# Patient Record
Sex: Male | Born: 1980 | Race: Black or African American | Hispanic: No | Marital: Single | Smoking: Current every day smoker
Health system: Southern US, Community
[De-identification: ages and names within clinical notes are randomized; demographics above are authoritative.]

## PROBLEM LIST (undated history)

## (undated) DIAGNOSIS — D573 Sickle-cell trait: Secondary | ICD-10-CM

## (undated) DIAGNOSIS — I2699 Other pulmonary embolism without acute cor pulmonale: Secondary | ICD-10-CM

## (undated) DIAGNOSIS — S3992XA Unspecified injury of lower back, initial encounter: Secondary | ICD-10-CM

## (undated) DIAGNOSIS — G4700 Insomnia, unspecified: Secondary | ICD-10-CM

## (undated) DIAGNOSIS — F419 Anxiety disorder, unspecified: Secondary | ICD-10-CM

## (undated) DIAGNOSIS — J189 Pneumonia, unspecified organism: Secondary | ICD-10-CM

## (undated) DIAGNOSIS — M519 Unspecified thoracic, thoracolumbar and lumbosacral intervertebral disc disorder: Secondary | ICD-10-CM

## (undated) DIAGNOSIS — M545 Low back pain: Secondary | ICD-10-CM

## (undated) DIAGNOSIS — Z8489 Family history of other specified conditions: Secondary | ICD-10-CM

## (undated) DIAGNOSIS — G43009 Migraine without aura, not intractable, without status migrainosus: Secondary | ICD-10-CM

## (undated) DIAGNOSIS — Z87442 Personal history of urinary calculi: Secondary | ICD-10-CM

## (undated) DIAGNOSIS — G8929 Other chronic pain: Secondary | ICD-10-CM

## (undated) DIAGNOSIS — M5090 Cervical disc disorder, unspecified, unspecified cervical region: Secondary | ICD-10-CM

## (undated) DIAGNOSIS — K219 Gastro-esophageal reflux disease without esophagitis: Secondary | ICD-10-CM

## (undated) DIAGNOSIS — M549 Dorsalgia, unspecified: Secondary | ICD-10-CM

## (undated) HISTORY — DX: Cervical disc disorder, unspecified, unspecified cervical region: M50.90

## (undated) HISTORY — DX: Anxiety disorder, unspecified: F41.9

## (undated) HISTORY — DX: Unspecified thoracic, thoracolumbar and lumbosacral intervertebral disc disorder: M51.9

## (undated) HISTORY — DX: Low back pain: M54.5

## (undated) HISTORY — DX: Gastro-esophageal reflux disease without esophagitis: K21.9

## (undated) HISTORY — DX: Other chronic pain: G89.29

## (undated) HISTORY — DX: Insomnia, unspecified: G47.00

## (undated) HISTORY — PX: NO PAST SURGERIES: SHX2092

## (undated) HISTORY — DX: Migraine without aura, not intractable, without status migrainosus: G43.009

## (undated) HISTORY — PX: BACK SURGERY: SHX140

---
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
## 2000-03-26 ENCOUNTER — Emergency Department (HOSPITAL_COMMUNITY): Admission: EM | Admit: 2000-03-26 | Discharge: 2000-03-26 | Payer: Self-pay | Admitting: Dermatology

## 2000-03-27 ENCOUNTER — Encounter: Payer: Self-pay | Admitting: Emergency Medicine

## 2000-05-23 ENCOUNTER — Emergency Department (HOSPITAL_COMMUNITY): Admission: EM | Admit: 2000-05-23 | Discharge: 2000-05-23 | Payer: Self-pay | Admitting: Internal Medicine

## 2000-05-23 ENCOUNTER — Encounter: Payer: Self-pay | Admitting: Internal Medicine

## 2001-01-16 ENCOUNTER — Emergency Department (HOSPITAL_COMMUNITY): Admission: EM | Admit: 2001-01-16 | Discharge: 2001-01-16 | Payer: Self-pay | Admitting: Emergency Medicine

## 2002-01-17 ENCOUNTER — Emergency Department (HOSPITAL_COMMUNITY): Admission: EM | Admit: 2002-01-17 | Discharge: 2002-01-17 | Payer: Self-pay | Admitting: Emergency Medicine

## 2002-01-18 ENCOUNTER — Emergency Department (HOSPITAL_COMMUNITY): Admission: EM | Admit: 2002-01-18 | Discharge: 2002-01-18 | Payer: Self-pay | Admitting: *Deleted

## 2002-01-20 ENCOUNTER — Emergency Department (HOSPITAL_COMMUNITY): Admission: EM | Admit: 2002-01-20 | Discharge: 2002-01-20 | Payer: Self-pay | Admitting: Emergency Medicine

## 2002-09-03 ENCOUNTER — Emergency Department (HOSPITAL_COMMUNITY): Admission: EM | Admit: 2002-09-03 | Discharge: 2002-09-04 | Payer: Self-pay | Admitting: Emergency Medicine

## 2002-09-03 ENCOUNTER — Encounter: Payer: Self-pay | Admitting: *Deleted

## 2004-01-19 ENCOUNTER — Emergency Department (HOSPITAL_COMMUNITY): Admission: EM | Admit: 2004-01-19 | Discharge: 2004-01-19 | Payer: Self-pay

## 2004-05-10 ENCOUNTER — Emergency Department (HOSPITAL_COMMUNITY): Admission: EM | Admit: 2004-05-10 | Discharge: 2004-05-10 | Payer: Self-pay | Admitting: Emergency Medicine

## 2004-08-24 ENCOUNTER — Ambulatory Visit: Payer: Self-pay | Admitting: Internal Medicine

## 2005-08-25 ENCOUNTER — Emergency Department (HOSPITAL_COMMUNITY): Admission: EM | Admit: 2005-08-25 | Discharge: 2005-08-26 | Payer: Self-pay | Admitting: *Deleted

## 2005-09-03 ENCOUNTER — Emergency Department (HOSPITAL_COMMUNITY): Admission: EM | Admit: 2005-09-03 | Discharge: 2005-09-03 | Payer: Self-pay | Admitting: Family Medicine

## 2005-11-27 ENCOUNTER — Emergency Department (HOSPITAL_COMMUNITY): Admission: EM | Admit: 2005-11-27 | Discharge: 2005-11-28 | Payer: Self-pay | Admitting: Emergency Medicine

## 2006-06-14 ENCOUNTER — Emergency Department (HOSPITAL_COMMUNITY): Admission: EM | Admit: 2006-06-14 | Discharge: 2006-06-14 | Payer: Self-pay | Admitting: Emergency Medicine

## 2007-04-10 ENCOUNTER — Emergency Department (HOSPITAL_COMMUNITY): Admission: EM | Admit: 2007-04-10 | Discharge: 2007-04-10 | Payer: Self-pay | Admitting: Family Medicine

## 2007-09-10 ENCOUNTER — Telehealth (INDEPENDENT_AMBULATORY_CARE_PROVIDER_SITE_OTHER): Payer: Self-pay | Admitting: *Deleted

## 2007-12-06 ENCOUNTER — Encounter: Payer: Self-pay | Admitting: Internal Medicine

## 2007-12-06 ENCOUNTER — Emergency Department (HOSPITAL_COMMUNITY): Admission: EM | Admit: 2007-12-06 | Discharge: 2007-12-06 | Payer: Self-pay | Admitting: Emergency Medicine

## 2008-12-05 ENCOUNTER — Emergency Department (HOSPITAL_COMMUNITY): Admission: EM | Admit: 2008-12-05 | Discharge: 2008-12-05 | Payer: Self-pay | Admitting: Emergency Medicine

## 2010-09-13 ENCOUNTER — Emergency Department (HOSPITAL_COMMUNITY): Admission: EM | Admit: 2010-09-13 | Discharge: 2010-09-13 | Payer: Self-pay | Admitting: Family Medicine

## 2010-09-20 ENCOUNTER — Emergency Department (HOSPITAL_COMMUNITY)
Admission: EM | Admit: 2010-09-20 | Discharge: 2010-09-20 | Payer: Self-pay | Source: Home / Self Care | Admitting: Emergency Medicine

## 2010-09-23 ENCOUNTER — Encounter: Payer: Self-pay | Admitting: Internal Medicine

## 2010-09-23 ENCOUNTER — Ambulatory Visit: Payer: Self-pay | Admitting: Internal Medicine

## 2010-09-23 DIAGNOSIS — G43009 Migraine without aura, not intractable, without status migrainosus: Secondary | ICD-10-CM

## 2010-09-23 DIAGNOSIS — R109 Unspecified abdominal pain: Secondary | ICD-10-CM

## 2010-09-23 HISTORY — DX: Migraine without aura, not intractable, without status migrainosus: G43.009

## 2010-09-23 LAB — CONVERTED CEMR LAB
ALT: 16 units/L (ref 0–53)
AST: 14 units/L (ref 0–37)
Albumin: 3.6 g/dL (ref 3.5–5.2)
Alkaline Phosphatase: 40 units/L (ref 39–117)
BUN: 11 mg/dL (ref 6–23)
Basophils Absolute: 0 10*3/uL (ref 0.0–0.1)
Basophils Relative: 0.6 % (ref 0.0–3.0)
Bilirubin Urine: NEGATIVE
Bilirubin, Direct: 0 mg/dL (ref 0.0–0.3)
CO2: 29 meq/L (ref 19–32)
Calcium: 9.1 mg/dL (ref 8.4–10.5)
Chloride: 107 meq/L (ref 96–112)
Creatinine, Ser: 0.9 mg/dL (ref 0.4–1.5)
Eosinophils Absolute: 0.3 10*3/uL (ref 0.0–0.7)
Eosinophils Relative: 3.2 % (ref 0.0–5.0)
GFR calc non Af Amer: 122.04 mL/min (ref >60.00)
Glucose, Bld: 86 mg/dL (ref 70–99)
HCT: 40.3 % (ref 39.0–52.0)
Hemoglobin: 13.6 g/dL (ref 13.0–17.0)
Ketones, ur: NEGATIVE mg/dL
Leukocytes, UA: NEGATIVE
Lipase: 22 units/L (ref 11.0–59.0)
Lymphocytes Relative: 16.3 % (ref 12.0–46.0)
Lymphs Abs: 1.4 10*3/uL (ref 0.7–4.0)
MCHC: 33.7 g/dL (ref 30.0–36.0)
MCV: 85.5 fL (ref 78.0–100.0)
Monocytes Absolute: 0.7 10*3/uL (ref 0.1–1.0)
Monocytes Relative: 8.8 % (ref 3.0–12.0)
Neutro Abs: 5.9 10*3/uL (ref 1.4–7.7)
Neutrophils Relative %: 71.1 % (ref 43.0–77.0)
Nitrite: NEGATIVE
Platelets: 255 10*3/uL (ref 150.0–400.0)
Potassium: 4.4 meq/L (ref 3.5–5.1)
RBC: 4.71 M/uL (ref 4.22–5.81)
RDW: 13.7 % (ref 11.5–14.6)
Sodium: 142 meq/L (ref 135–145)
Specific Gravity, Urine: 1.01 (ref 1.000–1.030)
Total Bilirubin: 0.4 mg/dL (ref 0.3–1.2)
Total Protein, Urine: NEGATIVE mg/dL
Total Protein: 6.4 g/dL (ref 6.0–8.3)
Urine Glucose: NEGATIVE mg/dL
Urobilinogen, UA: 0.2 (ref 0.0–1.0)
WBC: 8.4 10*3/uL (ref 4.5–10.5)
pH: 6 (ref 5.0–8.0)

## 2010-09-24 ENCOUNTER — Encounter (INDEPENDENT_AMBULATORY_CARE_PROVIDER_SITE_OTHER): Payer: Self-pay | Admitting: *Deleted

## 2010-09-29 ENCOUNTER — Ambulatory Visit: Payer: Self-pay | Admitting: Cardiology

## 2010-10-05 ENCOUNTER — Ambulatory Visit: Payer: Self-pay | Admitting: Internal Medicine

## 2010-10-05 DIAGNOSIS — K5900 Constipation, unspecified: Secondary | ICD-10-CM | POA: Insufficient documentation

## 2010-10-06 ENCOUNTER — Telehealth: Payer: Self-pay | Admitting: Gastroenterology

## 2010-10-08 ENCOUNTER — Ambulatory Visit: Payer: Self-pay | Admitting: Gastroenterology

## 2010-10-08 ENCOUNTER — Ambulatory Visit: Payer: Self-pay | Admitting: Internal Medicine

## 2010-10-19 ENCOUNTER — Telehealth: Payer: Self-pay | Admitting: Internal Medicine

## 2010-10-21 ENCOUNTER — Telehealth: Payer: Self-pay | Admitting: Internal Medicine

## 2010-10-21 ENCOUNTER — Emergency Department (HOSPITAL_COMMUNITY)
Admission: EM | Admit: 2010-10-21 | Discharge: 2010-10-21 | Payer: Self-pay | Source: Home / Self Care | Admitting: Emergency Medicine

## 2010-10-21 LAB — DIFFERENTIAL
Basophils Absolute: 0 10*3/uL (ref 0.0–0.1)
Basophils Relative: 0 % (ref 0–1)
Eosinophils Absolute: 0.3 10*3/uL (ref 0.0–0.7)
Eosinophils Relative: 5 % (ref 0–5)
Lymphocytes Relative: 30 % (ref 12–46)
Lymphs Abs: 1.8 10*3/uL (ref 0.7–4.0)
Monocytes Absolute: 0.6 10*3/uL (ref 0.1–1.0)
Monocytes Relative: 9 % (ref 3–12)
Neutro Abs: 3.3 10*3/uL (ref 1.7–7.7)
Neutrophils Relative %: 55 % (ref 43–77)

## 2010-10-21 LAB — CBC
HCT: 41.9 % (ref 39.0–52.0)
Hemoglobin: 14.5 g/dL (ref 13.0–17.0)
MCH: 28 pg (ref 26.0–34.0)
MCHC: 34.6 g/dL (ref 30.0–36.0)
MCV: 81 fL (ref 78.0–100.0)
Platelets: 251 10*3/uL (ref 150–400)
RBC: 5.17 MIL/uL (ref 4.22–5.81)
RDW: 13.7 % (ref 11.5–15.5)
WBC: 6 10*3/uL (ref 4.0–10.5)

## 2010-10-21 LAB — URINALYSIS, ROUTINE W REFLEX MICROSCOPIC
Bilirubin Urine: NEGATIVE
Ketones, ur: NEGATIVE mg/dL
Leukocytes, UA: NEGATIVE
Nitrite: NEGATIVE
Protein, ur: NEGATIVE mg/dL
Specific Gravity, Urine: 1.026 (ref 1.005–1.030)
Urine Glucose, Fasting: NEGATIVE mg/dL
Urobilinogen, UA: 0.2 mg/dL (ref 0.0–1.0)
pH: 6 (ref 5.0–8.0)

## 2010-10-21 LAB — COMPREHENSIVE METABOLIC PANEL
ALT: 36 U/L (ref 0–53)
AST: 23 U/L (ref 0–37)
Albumin: 4 g/dL (ref 3.5–5.2)
Alkaline Phosphatase: 42 U/L (ref 39–117)
BUN: 9 mg/dL (ref 6–23)
CO2: 26 mEq/L (ref 19–32)
Calcium: 9.3 mg/dL (ref 8.4–10.5)
Chloride: 107 mEq/L (ref 96–112)
Creatinine, Ser: 1.01 mg/dL (ref 0.4–1.5)
GFR calc Af Amer: >60 mL/min (ref >60)
GFR calc non Af Amer: >60 mL/min (ref >60)
Glucose, Bld: 96 mg/dL (ref 70–99)
Potassium: 3.9 mEq/L (ref 3.5–5.1)
Sodium: 140 mEq/L (ref 135–145)
Total Bilirubin: 0.9 mg/dL (ref 0.3–1.2)
Total Protein: 7.4 g/dL (ref 6.0–8.3)

## 2010-10-21 LAB — URINE MICROSCOPIC-ADD ON

## 2010-10-22 ENCOUNTER — Ambulatory Visit: Admit: 2010-10-22 | Payer: Self-pay | Admitting: Internal Medicine

## 2010-10-28 ENCOUNTER — Telehealth: Payer: Self-pay | Admitting: Internal Medicine

## 2010-11-08 ENCOUNTER — Ambulatory Visit: Admit: 2010-11-08 | Payer: Self-pay | Admitting: Internal Medicine

## 2010-11-18 NOTE — Progress Notes (Signed)
Summary: Pain Clinic Referral  Phone Note From Other Clinic   Summary of Call: Dr Jonny Ruiz, Dr Thyra Breed ([facility] ) office called pt was scheduled for appt 10/28/10 and no showed appt. They said he was aware of appt and  they will not reschedule pt appt. Initial call taken by: Dagoberto Reef,  October 28, 2010 9:22 AM  Follow-up for Phone Call        noted Follow-up by: Corwin Levins MD,  October 28, 2010 10:19 AM

## 2010-11-18 NOTE — Letter (Signed)
Summary: Generic Letter  [facility]  [address] [place], [place] [address]   Phone: [phone]  Fax: [phone]    09/24/2010  Jason [name] [address] [place], [place]  [address]  Dear [name],   Our office has tried to contact you regarding the results of your CT scan:   shows No stone , but did show some ? inflammation near the end of the colon and some fluid in the pelvis;  there seems to be some inflammation in the abd area or infection that will need to be further eval and treated      1)   for levofloxocin course AND flagyl course     2)   blood tests still pending, but assuming the renal function is ok, will need to do:                 CT abd/pelvis WITH contrast to look for unusual problem such as abscess or other mesenteric inflammation. Your lab results were normal.  The antibiotics Dr Jonny Ruiz would like you to start are included with this letter, please start as soon as possible. If you have any further questions please contact our office at the above number. Thank you.       Sincerely,   Margaret Pyle, CMA Dr Oliver Barre  Appended Document: Generic Letter pt called back and was advised

## 2010-11-18 NOTE — Assessment & Plan Note (Signed)
Summary: new / last ov 2005 / bcbs / # / cd   Vital Signs:  Patient profile:   30 year old male Height:      68 inches Weight:      157.50 pounds BMI:     24.03 O2 Sat:      98 % on Room air Temp:     98.7 degrees F oral Pulse rate:   68 / minute BP sitting:   110 / 72  (left arm) Cuff size:   regular  Vitals Entered By: Zella Ball Ewing CMA Duncan Dull) (September 23, 2010 2:05 PM)  O2 Flow:  Room air  Preventive Care Screening  Last Tetanus Booster:    Date:  10/17/2005    Results:  Tdap   CC: New Patient, Left side pain/RE   CC:  New Patient and Left side pain/RE.  History of Present Illness: here to establish with f/u from urgent care - seen there Mon dec 5,  eval for left side/flank pain;  actually off and on for yrs, now recently for 2-3 wks ongoing pain; sharp,  no radiation;  nofever, intermittent nausea (no vomiting);  + constipation , (but also diarrhea episode 2 days ago); no blood in BM 's;  pain not better with BM;  no overt GU symtpoms - no dysuria, freq, urgency, hematuria; no prior hx of renal stones (grandmother with hx of renal stones, father estranged so unknown); movement makes no difference in pain,  nothing makes beteer  - tylenol or alleve,  though percocet did seem to help but had to take more than prescribed. Pt denies CP, worsening sob, doe, wheezing, orthopnea, pnd, worsening LE edema, palps, dizziness or syncope  Pt denies new neuro symptoms such as headache, facial or extremity weakness  Pt denies polydipsia, polyuria.   Overall good compliance with meds, trying to follow low chol diet, wt stable, little excercise however . No fever, wt loss, night sweats, loss of appetite or other constitutional symptoms  Denies worsening depressive symptoms, suicidal ideation, or panic.   Had UA with small blood from the urgent care, neg cxr as well.   Preventive Screening-Counseling & Management  Alcohol-Tobacco     Smoking Status: current      Drug Use:  no.    Problems  Prior to Update: 1)  Flank Pain, Left  (ICD-789.09) 2)  Migraine, Common  (ICD-346.10)  Medications Prior to Update: 1)  Promethazine Hcl 25 Mg  Tabs (Promethazine Hcl) .Marland Kitchen.. 1 By Mouth Qid Prn  Current Medications (verified): 1)  Promethazine Hcl 25 Mg  Tabs (Promethazine Hcl) .Marland Kitchen.. 1 By Mouth Qid Prn 2)  Ibuprofen 600 Mg Tabs (Ibuprofen) .Marland Kitchen.. 1 To 2 By Mouth Every 6 Hours As Needed 3)  Oxycodone Hcl 10 Mg Tabs (Oxycodone Hcl) .Marland Kitchen.. 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 mother with heart disease and HTN grandmother with breast cancer other with ETOH,  breast cancer, prostate cancer,  chol, renal disease  Social History: Reviewed history and no changes required. 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  , non toxic but uncomfortable Head:  normocephalic and atraumatic.   Eyes:  vision grossly intact, pupils equal, and pupils round.   Ears:  R ear normal and L ear normal.   Nose:  no external deformity and no nasal discharge.   Mouth:  no gingival abnormalities and pharynx pink and moist.   Neck:  supple and no masses.   Lungs:  normal respiratory effort and normal breath sounds.     Heart:  normal rate and regular rhythm.   Abdomen:  soft, non-tender, and normal bowel sounds.   Msk:  no joint tenderness and no joint swelling.  , no spine tenderness or paravertebral swelling Extremities:  no edema, no erythema  Neurologic:  strength normal in all extremities and gait normal.   Skin:  color normal and no rashes.   Psych:  not depressed appearing and slightly anxious.     Impression & Recommendations:  Problem # 1:  FLANK PAIN, LEFT (ICD-789.09)  His updated medication list for this problem includes:    Ibuprofen 600 Mg Tabs (Ibuprofen) .Marland Kitchen... 1 to 2 by mouth every 6 hours as needed    Oxycodone Hcl 10 Mg Tabs (Oxycodone hcl) .Marland Kitchen... 1 by mouth q 6 hrs as needed pain mod to high suspicion for renal stone given the severity of the pain, and recent blood on UA -  for Ct urogram, without  CM to start;  currently afeb, but will check wbc, holding antibx for now   Orders: TLB-BMP (Basic Metabolic Panel-BMET) (80048-METABOL) TLB-CBC Platelet - w/Differential (85025-CBCD) TLB-Udip w/ Micro (81001-URINE) T-Culture, Urine (81191-47829) TLB-Hepatic/Liver Function Pnl (80076-HEPATIC) TLB-Lipase (83690-LIPASE) Radiology Referral (Radiology)  Complete Medication List: 1)  Promethazine Hcl 25 Mg Tabs (Promethazine hcl) .Marland Kitchen.. 1 by mouth qid prn 2)  Ibuprofen 600 Mg Tabs (Ibuprofen) .Marland Kitchen.. 1 to 2 by mouth every 6 hours as needed 3)  Oxycodone Hcl 10 Mg Tabs (Oxycodone hcl) .Marland Kitchen.. 1 by mouth q 6 hrs as needed pain  Patient Instructions: 1)  Please take all new medications as prescribed 2)  Continue all previous medications as before this visit  3)  You will be contacted about the referral(s) to:  CT scan - to see PCC's prior to leaving today 4)  Please go to the Lab in the basement for your blood and/or urine tests today  5)  Please call the number on the [facility] Card for results of your testing  6)  If you have a kidney stone , you will be referred to urology 7)  Please schedule a  follow-up appointment as needed. Prescriptions: OXYCODONE HCL 10 MG TABS (OXYCODONE HCL) 1 by mouth q 6 hrs as needed pain  #40 x 0   Entered and Authorized by:   Corwin Levins MD   Signed by:   Corwin Levins MD on 09/23/2010   Method used:   Print then Give to Patient   RxID:   [id number]    Orders Added: 1)  TLB-BMP (Basic Metabolic Panel-BMET) [80048-METABOL] 2)  TLB-CBC Platelet - w/Differential [85025-CBCD] 3)  TLB-Udip w/ Micro [81001-URINE] 4)  T-Culture, Urine [[id number]] 5)  TLB-Hepatic/Liver Function  Pnl [80076-HEPATIC] 6)  TLB-Lipase [83690-LIPASE] 7)  Radiology Referral [Radiology] 8)  New Patient Level IV [[address]]

## 2010-11-18 NOTE — Assessment & Plan Note (Signed)
Summary: pain on left side-lb   Vital Signs:  Patient profile:   30 year old male Height:      68 inches Weight:      153 pounds BMI:     23.35 O2 Sat:      97 % on Room air Temp:     98.4 degrees F oral Pulse rate:   83 / minute BP sitting:   104 / 78  (left arm) Cuff size:   regular  Vitals Entered By: Zella Ball Ewing CMA Duncan Dull) (October 08, 2010 3:20 PM)  O2 Flow:  Room air CC: left side pain/RE   Primary Care Provider:  Bayard Males  CC:  left side pain/RE.  History of Present Illness: here to f/u;  GI brief eval without significant GI relation to his pain;  pt admits to med overuse and last rx with only approx 10 pills left;  Pt denies CP, worsening sob, doe, wheezing, orthopnea, pnd, worsening LE edema, palps, dizziness or syncope  Pt denies new neuro symptoms such as headache, facial or extremity weakness  Pt denies polydipsia, polyuria  No new GI complaints such as dysphagia, bowel change or blood.Marland Kitchen  No change in left side/flank pain however, sharp , persitent and simply not improved at all, mod to severe, no change with deep inspiration, BM, movement.  No rash and no spine pain.  No bladder change , or LE pain/weak/numb , gait change or falls.  No recent injury. No fever, wt loss, night sweats, loss of appetite or other constitutional symptoms   Problems Prior to Update: 1)  Constipation  (ICD-564.00) 2)  Abdominal Pain Other Specified Site  (ICD-789.09) 3)  Flank Pain, Left  (ICD-789.09) 4)  Migraine, Common  (ICD-346.10)  Medications Prior to Update: 1)  Oxycodone Hcl 5 Mg Tabs (Oxycodone Hcl) .Marland Kitchen.. 1po Q 6 Hrs As Needed Pain 2)  Metronidazole 250 Mg Tabs (Metronidazole) .Marland Kitchen.. 1 By Mouth Four Times Per Day 3)  Dexilant 60 Mg Cpdr (Dexlansoprazole) .Marland Kitchen.. 1 By Mouth 20-30 Min Before Breakfast  Current Medications (verified): 1)  Oxycodone Hcl 5 Mg Tabs (Oxycodone Hcl) .Marland Kitchen.. 1po Q 6 Hrs As Needed Pain 2)  Metronidazole 250 Mg Tabs (Metronidazole) .Marland Kitchen.. 1 By Mouth Four Times  Per Day 3)  Dexilant 60 Mg Cpdr (Dexlansoprazole) .Marland Kitchen.. 1 By Mouth 20-30 Min Before Breakfast 4)  Gabapentin 300 Mg Caps (Gabapentin) .Marland Kitchen.. 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  , non toxic but very uncomfortable Head:  normocephalic and atraumatic.   Eyes:  vision grossly intact, pupils equal, and pupils round.   Ears:  R ear normal and L ear normal.   Nose:  no external deformity and no nasal discharge.   Mouth:  no gingival abnormalities and pharynx pink and moist.   Neck:  supple and no masses.   Lungs:  normal respiratory effort and normal breath sounds.   Heart:  normal rate and regular rhythm.   Abdomen:  soft,  non-tender, and normal bowel sounds.   Msk:  no joint tenderness and no joint swelling.  , no spine tenderness or paravertebral swelling or tenderness; no flank tenderness;  no side or chest wall tender as well Extremities:  no edema, no erythema  Neurologic:  strength normal in all extremities and gait normal.   Skin:  color normal and no rashes.     Impression & Recommendations:  Problem # 1:  FLANK PAIN, LEFT (ICD-789.09)  His updated medication list for this problem includes:    Oxycodone Hcl 5 Mg Tabs (Oxycodone hcl) .Marland Kitchen... 1po q 6 hrs as needed pain pain persists , CT neg, GI eval as per emr, prior CXR per  urgent care neg per pt,  diff includes ? neuritic vs MSK , GI with neg eval;  will cont med as is for now except for trial gabapentin;  if helps  would consider referral to pain clinic for nerve block; urged pt to not overuse narcotic  Complete Medication List: 1)  Oxycodone Hcl 5 Mg Tabs (Oxycodone hcl) .Marland Kitchen.. 1po q 6 hrs as needed pain 2)  Metronidazole 250 Mg Tabs (Metronidazole) .Marland Kitchen.. 1 by mouth four times per day 3)  Dexilant 60 Mg Cpdr (Dexlansoprazole) .Marland Kitchen.. 1 by mouth 20-30 min before breakfast 4)  Gabapentin 300 Mg Caps (Gabapentin) .Marland Kitchen.. 1 by mouth three times a day  Patient Instructions: 1)  Continue all previous medications as before this visit  - you should have enough oxycodone for total 15 days with the dec 20 prescription 2)  start the gabapentin at 300 mg at night for two nights, then twice per day for 2 days, then three times per day after that 3)  please call in 10 to 14 days if the new medication seems to help, as we might then consider referral to pain clinic 4)  Please schedule a follow-up appointment as needed. Prescriptions: GABAPENTIN 300 MG CAPS (GABAPENTIN) 1 by mouth three times a day  #90 x 2   Entered and Authorized by:   Corwin Levins MD   Signed by:   Corwin Levins MD on 10/08/2010   Method used:   Print then Give to Patient   RxID:   [id number]    Orders Added: 1)  Est. Patient Level III [14782]

## 2010-11-18 NOTE — Progress Notes (Signed)
Summary: Pain Clinic Referral  Phone Note From Other Clinic   Summary of Call: Dr Jonny Ruiz, [facility] and do not have any additiional pain/rehab options to offer paitient. They do not treat abdominal or pelvic pain.  Initial call taken by: Dagoberto Reef,  October 21, 2010 9:21 AM  Follow-up for Phone Call        any other pain clinic will see? Follow-up by: Corwin Levins MD,  October 21, 2010 9:31 AM  Additional Follow-up for Phone Call Additional follow up Details #1::        I have noted pt with appt tomorrow on my schedule;  If symptoms have not changed, I have nothing further to offer this pt in terms of further tests or evaluation, or treatment - pt should re-consider appt  I do not feel comfortable with further narcotic medication for this pain as there is no  cancer or other inflammatory condition;  I would recommend alleve  = 1-2 by mouth two times a day as needed  Additional Follow-up by: Corwin Levins MD,  October 21, 2010 4:35 PM    Additional Follow-up for Phone Call Additional follow up Details #2::    left message on machine for pt to return my call. Margaret Pyle, CMA  October 22, 2010 2:00 PM   Pt advised of MD  advisement but was unsatisfied and demanded appt to discuss with JWJ. Transferred to schedule appt. Margaret Pyle, CMA  October 27, 2010 3:20 PM

## 2010-11-18 NOTE — Progress Notes (Signed)
Summary: Flank pain  Phone Note Call from Patient Call back at Home Phone [phone]   Caller: Patient Summary of Call: Pt called stating he is still having flank pain and meds are not working, pt says he was told to call back per MD at last OV Initial call taken by: Margaret Pyle, CMA,  October 19, 2010 1:21 PM  Follow-up for Phone Call        i can try muscle relaxer, but other than that I have nothing further to offer and will refer to pain clinic  done hardcopy to LIM side B - dahlia  Follow-up by: Corwin Levins MD,  October 19, 2010 1:32 PM  Additional Follow-up for Phone Call Additional follow up Details #1::        Pt advised, Rx in cabinet for pt pick up Additional Follow-up by: Margaret Pyle, CMA,  October 19, 2010 1:44 PM    New/Updated Medications: TIZANIDINE HCL 4 MG TABS (TIZANIDINE HCL) by mouth q 6 hrs as needed spasm Prescriptions: TIZANIDINE HCL 4 MG TABS (TIZANIDINE HCL) by mouth q 6 hrs as needed spasm  #50 x 1   Entered and Authorized by:   Corwin Levins MD   Signed by:   Corwin Levins MD on 10/19/2010   Method used:   Print then Give to Patient   RxID:   [id number]

## 2010-11-18 NOTE — Progress Notes (Signed)
Summary: Doc of the day  Phone Note From Other Clinic   Caller: Debra @ Dr. Jonny Ruiz  [phone] Call For: Dr. Christella Hartigan Summary of Call: abd pain requesting pt. be seen ASAP Initial call taken by: Karna Christmas,  October 06, 2010 3:11 PM  Follow-up for Phone Call        Given appt. with Dr.Jacobs for this Friday.Stanton Kidney will contact pt. Follow-up by: Teryl Lucy RN,  October 06, 2010 3:38 PM

## 2010-11-18 NOTE — Assessment & Plan Note (Signed)
History of Present Illness Visit Type: Initial Consult Primary GI MD: Rob Bunting MD Primary Provider: Bayard Males Requesting Provider: Bayard Males Chief Complaint: Abdominal Pain History of Present Illness:     Jason 30 year old [name] who is here with his mother today.  who has had severe pains in his left side, constant pain.  Getting worse.  CAn radiate to his back.  +nausea, + vomiting at times.  Moving, bending down, walking all make it worse.  Eating does not change the pains.  Bms don't make it better or worse.  No fevers or chills.  He wsas put on metronidazole a few days ago, no change in his symptoms.  Has been on oxycodone, these help.  Takes 4-5 of pills at a time.  He has not tried any antiacid meds.    He has been constapated for about a month.  Miralax once a day, didn't help.  he had a noncontrast CT scan about 2 weeks ago. I looked at those images myself. The radiologist felt there "may be a small amount of fluid around his colon". Followup scan about a week later with IV contrast and oral contrast was actually completely normal. He had CBC, complete metabolic profile, amylase, these were all normal 2 weeks ago.           Current Medications (verified): 1)  Oxycodone Hcl 5 Mg Tabs (Oxycodone Hcl) .Marland Kitchen.. 1po Q 6 Hrs As Needed Pain 2)  Metronidazole 250 Mg Tabs (Metronidazole) .Marland Kitchen.. 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  All other review of systems was otherwise negative.   Vital Signs:  Patient profile:   30 year old male Height:      68 inches Weight:      152 pounds BMI:     23.20 BSA:     1.82 Pulse rate:   80 / minute Pulse rhythm:   regular BP sitting:   92 / 70  (left arm)  Vitals Entered By: Merri Ray CMA Duncan Dull) (October 08, 2010 2:37 PM)  Physical Exam  Additional Exam:  Constitutional: generally well appearing Psychiatric: alert and oriented times 3 Eyes: extraocular movements intact Mouth: oropharynx moist, no lesions Neck: supple, no lymphadenopathy Cardiovascular: heart regular rate and rythm Lungs: CTA bilaterally Abdomen: soft, non-tender, non-distended, no obvious ascites, no peritoneal signs, normal bowel sounds; She is mildly tender in his left flank Extremities: no lower extremity edema bilaterally Skin: no lesions on visible extremities    Impression & Recommendations:  Problem # 1:  left flank pain I do not think this is GI related. He had normal CBC, normal complete metabolic profile, normal amylase. 2 CT scans were recently done, the noncontrast CT scan may have suggested some fluid around his colon however a better quality one one week later was normal. His pain has not improved. I think this seems more musculoskeletal in nature. He asked  about me prescribing some more pain medicines for him and I declined. I did give him samples of antiacid medicine on the off chance that this is related to acid. That being said eating does not alter his pain neither does move his bowels.  Patient Instructions: 1)  Samples of PPI (antiacid medicine) given, take one pill once daily.    2)  Return to care of PCP. 3)  A copy of this information will be sent to Dr. Jonny Ruiz. 4)  The medication list was reviewed and reconciled.  All changed / newly prescribed medications were explained.  A complete medication list was provided to the patient /  caregiver.  Appended Document: dexilant samples given    Clinical Lists Changes  Medications: Added new medication of DEXILANT 60 MG CPDR (DEXLANSOPRAZOLE) 1 by mouth 20-30 min before breakfast

## 2010-11-18 NOTE — Assessment & Plan Note (Signed)
Summary: left flank pain/cd   Vital Signs:  Patient profile:   30 year old male Height:      68 inches Weight:      149.50 pounds BMI:     22.81 O2 Sat:      99 % on Room air Temp:     98.7 degrees F oral Pulse rate:   62 / minute BP sitting:   104 / 70  (left arm) Cuff size:   regular  Vitals Entered By: Zella Ball Ewing CMA Duncan Dull) (October 05, 2010 9:13 AM)  O2 Flow:  Room air CC: followup/rRE   CC:  followup/rRE.  History of Present Illness: here with acute c/o persistent as per last visit;  did not take any antibx mailed to him - did not receive apparetnly;  Pt denies CP, worsening sob, doe, wheezing, orthopnea, pnd, worsening LE edema, palps, dizziness or syncope  Pt denies new neuro symptoms such as headache, facial or extremity weakness  Pt denies polydipsis, polyuria.   Overall good compliance with meds o/w.  No new abd pain, n/v, bowel or bladder change, and denies back pain.   No fever, wt loss, night sweats, loss of appetite or other constitutional symptoms  Recent CT reviewed wtih pt.  Problems Prior to Update: 1)  Constipation  (ICD-564.00) 2)  Abdominal Pain Other Specified Site  (ICD-789.09) 3)  Flank Pain, Left  (ICD-789.09) 4)  Migraine, Common  (ICD-346.10)  Medications Prior to Update: 1)  Promethazine Hcl 25 Mg  Tabs (Promethazine Hcl) .Marland Kitchen.. 1 By Mouth Qid Prn 2)  Ibuprofen 600 Mg Tabs (Ibuprofen) .Marland Kitchen.. 1 To 2 By Mouth Every 6 Hours As Needed 3)  Oxycodone Hcl 10 Mg Tabs (Oxycodone Hcl) .Marland Kitchen.. 1 By Mouth Q 6 Hrs As Needed Pain 4)  Metronidazole 250 Mg Tabs (Metronidazole) .Marland Kitchen.. 1 By Mouth Four Times Per Day 5)  Levofloxacin 500 Mg Tabs (Levofloxacin) .Marland Kitchen.. 1 By Mouth Once Daily  Current Medications (verified): 1)  Oxycodone Hcl 5 Mg Tabs (Oxycodone Hcl) .Marland Kitchen.. 1po Q 6 Hrs As Needed Pain 2)  Metronidazole 250 Mg Tabs (Metronidazole) .Marland Kitchen.. 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  , non toxic Head:  normocephalic and atraumatic.   Eyes:  vision grossly intact, pupils equal, and pupils round.   Ears:  R ear normal and L ear normal.   Nose:  no external deformity and no nasal discharge.   Mouth:  no gingival abnormalities and pharynx pink and moist.   Neck:  supple and no masses.   Lungs:  normal respiratory effort and normal breath sounds.   Heart:  normal rate and regular rhythm.   Abdomen:  soft, non-tender, and normal bowel sounds.   Msk:  no joint tenderness and no joint swelling.  , no spine tenderness or paravertebral swelling Extremities:  no edema, no erythema  Neurologic:  strength normal in all extremities and gait normal.   Skin:  color normal and no rashes.   Psych:  slightly anxious.  Impression & Recommendations:  Problem # 1:  ABDOMINAL PAIN OTHER SPECIFIED SITE (ICD-789.09)  The following medications were removed from the medication list:    Ibuprofen 600 Mg Tabs (Ibuprofen) .Marland Kitchen... 1 to 2 by mouth every 6 hours as needed His updated medication list for this problem includes:    Oxycodone Hcl 5 Mg Tabs (Oxycodone hcl) .Marland Kitchen... 1po q 6 hrs as needed pain etiology unclear and I am at a loss to explain his subjective severe pain given the overall benign exam;  recent ct with "stranding" near the sigmoid colon, as well as constipation;  pt never took the antibx;  will tx today with course of flagyl, but also refer to GI for consideration colonoscopy to r/o IBD  Orders: Gastroenterology Referral (GI)  Problem # 2:  CONSTIPATION  (ICD-564.00) ? related to narcotic vs other - for stool softner, mag citrate and /or MOM as needed   Problem # 3:  FLANK PAIN, LEFT (ICD-789.09)  The following medications were removed from the medication list:    Ibuprofen 600 Mg Tabs (Ibuprofen) .Marland Kitchen... 1 to 2 by mouth every 6 hours as needed His updated medication list for this problem includes:    Oxycodone Hcl 5 Mg Tabs (Oxycodone hcl) .Marland Kitchen... 1po q 6 hrs as needed pain doubt spine or MSK or other , most likely I think referred pain related to the colon - treat as above, f/u any worsening signs or symptoms   Orders: Gastroenterology Referral (GI)  Complete Medication List: 1)  Oxycodone Hcl 5 Mg Tabs (Oxycodone hcl) .Marland Kitchen.. 1po q 6 hrs as needed pain 2)  Metronidazole 250 Mg Tabs (Metronidazole) .Marland Kitchen.. 1 by mouth four times per day  Patient Instructions: 1)  Please take all new medications as prescribed 2)  Continue all previous medications as before this visit  3)  You will be contacted about the referral(s) to: GI 4)  Please schedule a follow-up appointment as needed. Prescriptions: METRONIDAZOLE 250 MG TABS (METRONIDAZOLE) 1 by mouth four times per day  #40 x 0   Entered and Authorized by:   Corwin Levins MD   Signed by:   Corwin Levins MD on 10/05/2010   Method used:   Print then Give to Patient   RxID:   [id number] OXYCODONE HCL 5 MG TABS (OXYCODONE HCL) 1po q 6 hrs as needed pain  #60 x 0   Entered and Authorized by:   Corwin Levins MD   Signed by:   Corwin Levins MD on 10/05/2010   Method used:   Print then Give to Patient   RxID:   [id number]    Orders Added: 1)  Gastroenterology Referral [GI] 2)  Est. Patient Level IV [70350]

## 2010-12-27 LAB — POCT URINALYSIS DIPSTICK
Bilirubin Urine: NEGATIVE
Glucose, UA: NEGATIVE mg/dL
Ketones, ur: NEGATIVE mg/dL
Nitrite: NEGATIVE
Protein, ur: 30 mg/dL — AB
Specific Gravity, Urine: >=1.03 (ref 1.005–1.030)
Urobilinogen, UA: 0.2 mg/dL (ref 0.0–1.0)
pH: 5.5 (ref 5.0–8.0)

## 2010-12-28 ENCOUNTER — Other Ambulatory Visit: Payer: Self-pay | Admitting: Family Medicine

## 2010-12-29 ENCOUNTER — Other Ambulatory Visit: Payer: Self-pay | Admitting: Family Medicine

## 2010-12-29 DIAGNOSIS — Z77018 Contact with and (suspected) exposure to other hazardous metals: Secondary | ICD-10-CM

## 2010-12-30 ENCOUNTER — Inpatient Hospital Stay: Admission: RE | Admit: 2010-12-30 | Payer: Self-pay | Source: Ambulatory Visit

## 2010-12-30 ENCOUNTER — Ambulatory Visit
Admission: RE | Admit: 2010-12-30 | Discharge: 2010-12-30 | Disposition: A | Discharge status: Home / Self Care | Payer: BC Managed Care – PPO | Source: Ambulatory Visit | Attending: Family Medicine | Admitting: Family Medicine

## 2010-12-30 ENCOUNTER — Other Ambulatory Visit: Payer: Self-pay | Admitting: Family Medicine

## 2010-12-30 DIAGNOSIS — Z77018 Contact with and (suspected) exposure to other hazardous metals: Secondary | ICD-10-CM

## 2010-12-30 MED ORDER — GADOBENATE DIMEGLUMINE 529 MG/ML IV SOLN
14.0000 mL | Freq: Once | INTRAVENOUS | Status: AC | PRN
  Administered 2010-12-30: 14 mL via INTRAVENOUS

## 2010-12-31 ENCOUNTER — Other Ambulatory Visit: Payer: Self-pay

## 2011-01-03 ENCOUNTER — Emergency Department (HOSPITAL_COMMUNITY)
Admission: EM | Admit: 2011-01-03 | Discharge: 2011-01-03 | Disposition: A | Discharge status: Home / Self Care | Payer: BC Managed Care – PPO | Attending: Emergency Medicine | Admitting: Emergency Medicine

## 2011-01-03 DIAGNOSIS — K920 Hematemesis: Secondary | ICD-10-CM | POA: Insufficient documentation

## 2011-01-03 DIAGNOSIS — Z79899 Other long term (current) drug therapy: Secondary | ICD-10-CM | POA: Insufficient documentation

## 2011-01-03 LAB — DIFFERENTIAL
Basophils Absolute: 0 10*3/uL (ref 0.0–0.1)
Basophils Relative: 0 % (ref 0–1)
Eosinophils Absolute: 0.1 10*3/uL (ref 0.0–0.7)
Eosinophils Relative: 1 % (ref 0–5)
Lymphocytes Relative: 16 % (ref 12–46)
Lymphs Abs: 1.4 10*3/uL (ref 0.7–4.0)
Monocytes Absolute: 0.6 10*3/uL (ref 0.1–1.0)
Monocytes Relative: 7 % (ref 3–12)
Neutro Abs: 6.6 10*3/uL (ref 1.7–7.7)
Neutrophils Relative %: 76 % (ref 43–77)

## 2011-01-03 LAB — BASIC METABOLIC PANEL
BUN: 7 mg/dL (ref 6–23)
CO2: 30 mEq/L (ref 19–32)
Calcium: 9.2 mg/dL (ref 8.4–10.5)
Chloride: 104 mEq/L (ref 96–112)
Creatinine, Ser: 0.86 mg/dL (ref 0.4–1.5)
GFR calc Af Amer: >60 mL/min (ref >60)
GFR calc non Af Amer: >60 mL/min (ref >60)
Glucose, Bld: 106 mg/dL — ABNORMAL HIGH (ref 70–99)
Potassium: 3.9 mEq/L (ref 3.5–5.1)
Sodium: 139 mEq/L (ref 135–145)

## 2011-01-03 LAB — OCCULT BLOOD, POC DEVICE: Fecal Occult Bld: NEGATIVE

## 2011-01-03 LAB — CBC
HCT: 40.3 % (ref 39.0–52.0)
Hemoglobin: 13.7 g/dL (ref 13.0–17.0)
MCH: 27.1 pg (ref 26.0–34.0)
MCHC: 34 g/dL (ref 30.0–36.0)
MCV: 79.6 fL (ref 78.0–100.0)
Platelets: 288 10*3/uL (ref 150–400)
RBC: 5.06 MIL/uL (ref 4.22–5.81)
RDW: 13.6 % (ref 11.5–15.5)
WBC: 8.7 10*3/uL (ref 4.0–10.5)

## 2011-02-01 LAB — POCT I-STAT, CHEM 8
BUN: 14 mg/dL (ref 6–23)
Creatinine, Ser: 1.2 mg/dL (ref 0.4–1.5)
Glucose, Bld: 120 mg/dL — ABNORMAL HIGH (ref 70–99)
Hemoglobin: 17 g/dL (ref 13.0–17.0)
Potassium: 3.9 mEq/L (ref 3.5–5.1)
Sodium: 139 mEq/L (ref 135–145)

## 2011-02-01 LAB — RAPID URINE DRUG SCREEN, HOSP PERFORMED
Amphetamines: NOT DETECTED
Barbiturates: NOT DETECTED
Benzodiazepines: NOT DETECTED
Opiates: NOT DETECTED

## 2011-10-15 ENCOUNTER — Emergency Department (INDEPENDENT_AMBULATORY_CARE_PROVIDER_SITE_OTHER): Payer: BC Managed Care – PPO

## 2011-10-15 ENCOUNTER — Encounter: Payer: Self-pay | Admitting: *Deleted

## 2011-10-15 ENCOUNTER — Emergency Department (INDEPENDENT_AMBULATORY_CARE_PROVIDER_SITE_OTHER)
Admission: EM | Admit: 2011-10-15 | Discharge: 2011-10-15 | Disposition: A | Discharge status: Home / Self Care | Payer: Self-pay | Source: Home / Self Care | Attending: Family Medicine | Admitting: Family Medicine

## 2011-10-15 DIAGNOSIS — S20219A Contusion of unspecified front wall of thorax, initial encounter: Secondary | ICD-10-CM

## 2011-10-15 DIAGNOSIS — S60221A Contusion of right hand, initial encounter: Secondary | ICD-10-CM

## 2011-10-15 DIAGNOSIS — S60229A Contusion of unspecified hand, initial encounter: Secondary | ICD-10-CM

## 2011-10-15 HISTORY — DX: Unspecified injury of lower back, initial encounter: S39.92XA

## 2011-10-15 MED ORDER — IBUPROFEN 600 MG PO TABS: 600.0000 mg | ORAL_TABLET | Freq: Three times a day (TID) | ORAL | Status: AC | PRN

## 2011-10-15 MED ORDER — HYDROCODONE-ACETAMINOPHEN 5-325 MG PO TABS
1.0000 | ORAL_TABLET | Freq: Once | ORAL | Status: AC
  Administered 2011-10-15: 1 via ORAL

## 2011-10-15 MED ORDER — HYDROCODONE-ACETAMINOPHEN 5-325 MG PO TABS
ORAL_TABLET | ORAL | Status: AC
  Filled 2011-10-15: qty 1

## 2011-10-15 NOTE — ED Notes (Signed)
Instructions reviewed with pt and family
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
## 2012-01-29 ENCOUNTER — Emergency Department (HOSPITAL_COMMUNITY)
Admission: EM | Admit: 2012-01-29 | Discharge: 2012-01-29 | Disposition: A | Discharge status: Home / Self Care | Payer: Self-pay | Attending: Emergency Medicine | Admitting: Emergency Medicine

## 2012-01-29 ENCOUNTER — Encounter (HOSPITAL_COMMUNITY): Payer: Self-pay | Admitting: *Deleted

## 2012-01-29 DIAGNOSIS — R1110 Vomiting, unspecified: Secondary | ICD-10-CM | POA: Insufficient documentation

## 2012-01-29 DIAGNOSIS — F17200 Nicotine dependence, unspecified, uncomplicated: Secondary | ICD-10-CM | POA: Insufficient documentation

## 2012-01-29 DIAGNOSIS — R197 Diarrhea, unspecified: Secondary | ICD-10-CM | POA: Insufficient documentation

## 2012-01-29 LAB — COMPREHENSIVE METABOLIC PANEL
Albumin: 5.3 g/dL — ABNORMAL HIGH (ref 3.5–5.2)
Alkaline Phosphatase: 64 U/L (ref 39–117)
BUN: 13 mg/dL (ref 6–23)
Calcium: 11.2 mg/dL — ABNORMAL HIGH (ref 8.4–10.5)
Creatinine, Ser: 0.97 mg/dL (ref 0.50–1.35)
GFR calc Af Amer: >90 mL/min (ref >90)
Glucose, Bld: 127 mg/dL — ABNORMAL HIGH (ref 70–99)
Total Protein: 8.9 g/dL — ABNORMAL HIGH (ref 6.0–8.3)

## 2012-01-29 LAB — DIFFERENTIAL
Basophils Relative: 0 % (ref 0–1)
Eosinophils Absolute: 0.1 10*3/uL (ref 0.0–0.7)
Eosinophils Relative: 1 % (ref 0–5)
Lymphs Abs: 2.7 10*3/uL (ref 0.7–4.0)
Monocytes Absolute: 0.8 10*3/uL (ref 0.1–1.0)
Monocytes Relative: 8 % (ref 3–12)
Neutrophils Relative %: 67 % (ref 43–77)

## 2012-01-29 LAB — CBC
Hemoglobin: 17.3 g/dL — ABNORMAL HIGH (ref 13.0–17.0)
MCH: 28.3 pg (ref 26.0–34.0)
MCHC: 35.8 g/dL (ref 30.0–36.0)
MCV: 79.1 fL (ref 78.0–100.0)
RBC: 6.11 MIL/uL — ABNORMAL HIGH (ref 4.22–5.81)

## 2012-01-29 LAB — LIPASE, BLOOD: Lipase: 18 U/L (ref 11–59)

## 2012-01-29 MED ORDER — ONDANSETRON HCL 4 MG/2ML IJ SOLN
4.0000 mg | Freq: Once | INTRAMUSCULAR | Status: AC
  Administered 2012-01-29: 4 mg via INTRAVENOUS
  Filled 2012-01-29: qty 2

## 2012-01-29 MED ORDER — ONDANSETRON HCL 4 MG PO TABS: 4.0000 mg | ORAL_TABLET | Freq: Four times a day (QID) | ORAL | Status: AC

## 2012-01-29 MED ORDER — SODIUM CHLORIDE 0.9 % IV BOLUS (SEPSIS)
1000.0000 mL | Freq: Once | INTRAVENOUS | Status: AC
  Administered 2012-01-29: 1000 mL via INTRAVENOUS

## 2012-01-29 NOTE — ED Provider Notes (Signed)
I saw and evaluated the patient, reviewed the resident's note and I agree with the findings and plan.     Nelia Shi, MD 01/29/12 [phone]

## 2012-01-29 NOTE — ED Notes (Signed)
Patient refusing to get dressed after given discharge papers; keeps covering head up with blankets.  Charge notified.

## 2012-01-29 NOTE — ED Notes (Signed)
Patient kicked multiple blankets in floor; patient twisting and turning in bed, throwing sheets in floor.  Patient asking for more warm blankets; patient given warm blankets and pillow.  Will continue to monitor.

## 2012-01-29 NOTE — ED Notes (Signed)
Patient given discharge paperwork; went over discharge instructions with patient.  Patient instructed to take Zofran as directed and to follow up with referral within two days.  Patient instructed to return to the ED for new, worsening, or concerning symptoms.

## 2012-01-29 NOTE — ED Notes (Signed)
Received bedside report from [place], [place].  Patient currently lying in bed covering up face with blankets.  Patient refuses to uncover head; when asked about pain and nausea, patient shakes head "no".  Updated patient on plan of care; informed patient that lab results are starting to come back and that EDP will be in to talk with patient about lab results.  Patient has no other questions or concerns at this time; will continue to monitor.

## 2012-01-29 NOTE — ED Provider Notes (Signed)
History     CSN: [id number]  Arrival date & time 01/29/12  1924   First MD Initiated Contact with Patient 01/29/12 1939      Chief Complaint  Patient presents with  . Emesis    (Consider location/radiation/quality/duration/timing/severity/associated sxs/prior treatment) Patient is a 31 y.o. male presenting with vomiting and diarrhea. The history is provided by the patient.  Emesis  This is a new problem. The current episode started 6 to 12 hours ago. The problem occurs more than 10 times per day. The problem has not changed since onset.The emesis has an appearance of stomach contents. There has been no fever. Associated symptoms include abdominal pain, chills, diarrhea and sweats. Pertinent negatives include no cough, no fever, no headaches and no URI.  Diarrhea The primary symptoms include abdominal pain, nausea, vomiting and diarrhea. Primary symptoms do not include fever or rash. The illness began today. The onset was sudden.  The illness is also significant for chills and back pain (chronic). Associated medical issues do not include alcohol abuse.    Past Medical History  Diagnosis Date  . Back injury     History reviewed. No pertinent past surgical history.  No family history on file.  History  Substance Use Topics  . Smoking status: Current Everyday Smoker  . Smokeless tobacco: Not on file  . Alcohol Use: No      Review of Systems  Constitutional: Positive for chills. Negative for fever.  HENT: Negative for neck pain.   Respiratory: Negative for cough, chest tightness and shortness of breath.   Cardiovascular: Negative for chest pain.  Gastrointestinal: Positive for nausea, vomiting, abdominal pain and diarrhea.  Musculoskeletal: Positive for back pain (chronic).  Skin: Negative for rash.  Neurological: Negative for numbness and headaches.  All other systems reviewed and are negative.    Allergies  Review of patient's allergies indicates no known  allergies.  Home Medications   Current Outpatient Rx  Name Route Sig Dispense Refill  . FENTANYL 50 MCG/HR TD PT72 Transdermal Place 1 patch onto the skin every 3 (three) days. 25mg  patch     . OXYCODONE HCL PO Oral Take 30 mg by mouth 4 (four) times daily.        BP 111/81  Pulse 105  Temp(Src) 97.7 F (36.5 C) (Oral)  Resp 18  SpO2 99%  Physical Exam  Constitutional: He is oriented to person, place, and time. He appears well-developed and well-nourished.  HENT:  Head: Normocephalic and atraumatic.  Mouth/Throat: Mucous membranes are dry.  Eyes: EOM are normal.  Neck: Normal range of motion.  Cardiovascular: Normal rate, regular rhythm and normal heart sounds.   Pulmonary/Chest: Effort normal and breath sounds normal. No respiratory distress.  Abdominal: Soft. There is no tenderness. There is no rigidity, no rebound and no guarding.  Musculoskeletal: Normal range of motion.  Neurological: He is alert and oriented to person, place, and time.  Skin: Skin is warm and dry.  Psychiatric: He has a normal mood and affect.    ED Course  Procedures (including critical care time)  Labs Reviewed  CBC - Abnormal; Notable for the following:    WBC 11.1 (*)    RBC 6.11 (*)    Hemoglobin 17.3 (*)    All other components within normal limits  COMPREHENSIVE METABOLIC PANEL - Abnormal; Notable for the following:    Glucose, Bld 127 (*)    Calcium 11.2 (*)    Total Protein 8.9 (*)    Albumin 5.3 (*)  All other components within normal limits  DIFFERENTIAL  LIPASE, BLOOD   Results for orders placed during the hospital encounter of 01/29/12  CBC      Component Value Range   WBC 11.1 (*) 4.0 - 10.5 (K/uL)   RBC 6.11 (*) 4.22 - 5.81 (MIL/uL)   Hemoglobin 17.3 (*) 13.0 - 17.0 (g/dL)   HCT 16.1  09.6 - 04.5 (%)   MCV 79.1  78.0 - 100.0 (fL)   MCH 28.3  26.0 - 34.0 (pg)   MCHC 35.8  30.0 - 36.0 (g/dL)   RDW 40.9  81.1 - 91.4 (%)   Platelets 309  150 - 400 (K/uL)  DIFFERENTIAL        Component Value Range   Neutrophils Relative 67  43 - 77 (%)   Neutro Abs 7.4  1.7 - 7.7 (K/uL)   Lymphocytes Relative 25  12 - 46 (%)   Lymphs Abs 2.7  0.7 - 4.0 (K/uL)   Monocytes Relative 8  3 - 12 (%)   Monocytes Absolute 0.8  0.1 - 1.0 (K/uL)   Eosinophils Relative 1  0 - 5 (%)   Eosinophils Absolute 0.1  0.0 - 0.7 (K/uL)   Basophils Relative 0  0 - 1 (%)   Basophils Absolute 0.0  0.0 - 0.1 (K/uL)  COMPREHENSIVE METABOLIC PANEL      Component Value Range   Sodium 136  135 - 145 (mEq/L)   Potassium 3.8  3.5 - 5.1 (mEq/L)   Chloride 97  96 - 112 (mEq/L)   CO2 21  19 - 32 (mEq/L)   Glucose, Bld 127 (*) 70 - 99 (mg/dL)   BUN 13  6 - 23 (mg/dL)   Creatinine, Ser 7.82  0.50 - 1.35 (mg/dL)   Calcium 95.6 (*) 8.4 - 10.5 (mg/dL)   Total Protein 8.9 (*) 6.0 - 8.3 (g/dL)   Albumin 5.3 (*) 3.5 - 5.2 (g/dL)   AST 22  0 - 37 (U/L)   ALT 27  0 - 53 (U/L)   Alkaline Phosphatase 64  39 - 117 (U/L)   Total Bilirubin 0.6  0.3 - 1.2 (mg/dL)   GFR calc non Af Amer >90  >90 (mL/min)   GFR calc Af Amer >90  >90 (mL/min)  LIPASE, BLOOD      Component Value Range   Lipase 18  11 - 59 (U/L)    No results found.   1. Vomiting   2. Diarrhea       MDM  31 y/o M presents with N/V/D that began today. Numerous episodes. No blood appreciated in stool or emesis. No fevers, but chills and sweats present. Also myalgias.  Describes a crampy abd pain associated with vomiting.   Abd exam benign here without pain or any focal tenderness. Abd soft.   Labs benign. Appears c/w dehydration given the hemoconcentration.   Repeat exam remained with a soft abd without TTP. Did have vomiting in ED so given another liter of IVF and zofran.   Likely viral GI illness. Felt stable for d/c home with antiemetics and recommendations for PO hydration.         Donnamarie Poag, MD 01/29/12 [phone]

## 2012-01-29 NOTE — ED Notes (Signed)
The pt  Was lying floor on the carpet area of the ed.  The pt was brought into triage and almost immediately crawled into the floor  And had to be placed back in the w/c.   He has had n vomiting and diarrhea today.  He vomited approx 200 ml of clear liquid. Not wanting to  Answer questions

## 2012-01-29 NOTE — ED Notes (Signed)
Patient still refusing to leave at this time; security has been paged. Consulting civil engineer Reita Cliche) at bedside.

## 2012-01-29 NOTE — ED Notes (Signed)
Patient just vomited in floor (emesis bag in patient's hand).  Housekeeping notified to clean floor.  Attempting to get verbal order from EDP at this time.

## 2012-01-29 NOTE — ED Notes (Signed)
Patient currently asleep in bed; no respiratory or acute distress noted.  Will continue to monitor. 

## 2012-02-28 ENCOUNTER — Encounter (HOSPITAL_COMMUNITY): Payer: Self-pay | Admitting: *Deleted

## 2012-02-28 ENCOUNTER — Emergency Department (HOSPITAL_COMMUNITY)
Admission: EM | Admit: 2012-02-28 | Discharge: 2012-02-29 | Disposition: A | Discharge status: Home / Self Care | Payer: BC Managed Care – PPO | Attending: Emergency Medicine | Admitting: Emergency Medicine

## 2012-02-28 DIAGNOSIS — G8929 Other chronic pain: Secondary | ICD-10-CM | POA: Insufficient documentation

## 2012-02-28 DIAGNOSIS — R42 Dizziness and giddiness: Secondary | ICD-10-CM | POA: Insufficient documentation

## 2012-02-28 DIAGNOSIS — R630 Anorexia: Secondary | ICD-10-CM | POA: Insufficient documentation

## 2012-02-28 DIAGNOSIS — R51 Headache: Secondary | ICD-10-CM | POA: Insufficient documentation

## 2012-02-28 DIAGNOSIS — M549 Dorsalgia, unspecified: Secondary | ICD-10-CM | POA: Insufficient documentation

## 2012-02-28 LAB — COMPREHENSIVE METABOLIC PANEL
ALT: 42 U/L (ref 0–53)
Albumin: 4 g/dL (ref 3.5–5.2)
Alkaline Phosphatase: 56 U/L (ref 39–117)
Chloride: 103 mEq/L (ref 96–112)
Potassium: 3.8 mEq/L (ref 3.5–5.1)
Sodium: 141 mEq/L (ref 135–145)
Total Protein: 7.1 g/dL (ref 6.0–8.3)

## 2012-02-28 LAB — URINE MICROSCOPIC-ADD ON

## 2012-02-28 LAB — CBC
MCH: 27.7 pg (ref 26.0–34.0)
Platelets: 286 10*3/uL (ref 150–400)
RBC: 4.81 MIL/uL (ref 4.22–5.81)

## 2012-02-28 LAB — DIFFERENTIAL
Basophils Relative: 0 % (ref 0–1)
Eosinophils Absolute: 0.5 10*3/uL (ref 0.0–0.7)
Lymphs Abs: 2.2 10*3/uL (ref 0.7–4.0)
Neutro Abs: 7.6 10*3/uL (ref 1.7–7.7)
Neutrophils Relative %: 67 % (ref 43–77)

## 2012-02-28 LAB — URINALYSIS, ROUTINE W REFLEX MICROSCOPIC
Bilirubin Urine: NEGATIVE
Nitrite: NEGATIVE
Specific Gravity, Urine: 1.03 (ref 1.005–1.030)
Urobilinogen, UA: 1 mg/dL (ref 0.0–1.0)
pH: 6 (ref 5.0–8.0)

## 2012-02-28 MED ORDER — SODIUM CHLORIDE 0.9 % IV BOLUS (SEPSIS): 1000.0000 mL | Freq: Once | INTRAVENOUS | Status: DC

## 2012-02-28 MED ORDER — SODIUM CHLORIDE 0.9 % IV BOLUS (SEPSIS)
1000.0000 mL | Freq: Once | INTRAVENOUS | Status: AC
  Administered 2012-02-29: via INTRAVENOUS

## 2012-02-28 MED ORDER — HYDROMORPHONE HCL PF 2 MG/ML IJ SOLN
2.0000 mg | Freq: Once | INTRAMUSCULAR | Status: AC
  Administered 2012-02-28: 2 mg via INTRAVENOUS
  Filled 2012-02-28: qty 1

## 2012-02-28 NOTE — ED Notes (Signed)
The pt is drowsy and is keeping his eyes closed.  Mother at his bedside

## 2012-02-28 NOTE — ED Notes (Signed)
C/o dizziness & HA, "feel off balance, can barely walk".  Onset 0500. Also mentions, "legs gave way & I collapsed".  (Denies: LOC, fever, nvd).  H/o chronic back pain, but do not feel the pain today. HA is 7/10.

## 2012-02-28 NOTE — ED Notes (Signed)
The pt has had weakness dizziness and a headache since 0500am today.

## 2012-02-29 ENCOUNTER — Emergency Department (HOSPITAL_COMMUNITY): Payer: BC Managed Care – PPO

## 2012-02-29 LAB — ETHANOL: Alcohol, Ethyl (B): <11 mg/dL (ref 0–11)

## 2012-02-29 LAB — RAPID URINE DRUG SCREEN, HOSP PERFORMED: Benzodiazepines: POSITIVE — AB

## 2012-02-29 NOTE — ED Notes (Signed)
Walked to the br with assistance.  Pt tolerated well

## 2012-02-29 NOTE — ED Notes (Signed)
The pt is sleeping snoring louldly with family at the bedside.

## 2012-02-29 NOTE — ED Provider Notes (Signed)
History     CSN: [id number]  Arrival date & time 02/28/12  1932   First MD Initiated Contact with Patient 02/28/12 2303      Chief Complaint  Patient presents with  . Dizziness  . Headache    (Consider location/radiation/quality/duration/timing/severity/associated sxs/prior treatment) HPI The patient reports he did not go to bed last night until approximately 6 AM.  At approximately 5 AM he developed a sense of feeling off balance and a mild headache.  This sense of dizziness this continued throughout the day.  He denies unilateral arm or leg weakness.  Senna difficulty with speech.  There is no reported facial asymmetry.  He has chronic back pain for which she takes oxycodone and Duragesic patches.  He denies alcohol or drug abuse.  He reports mild decreased oral intake today.  His headache is generalized.  He has no changes in his vision.  His had no recent fevers or chills.  He denies neck stiffness.  He denies back pain.  Denies difficulty urinating.  He's had no nausea vomiting or diarrhea.  He denies chest pain or shortness of breath.  Said no abdominal pain.  His had no dysuria or urinary frequency.  He reports lightheadedness when he stands up Past Medical History  Diagnosis Date  . Back injury     History reviewed. No pertinent past surgical history.  No family history on file.  History  Substance Use Topics  . Smoking status: Current Everyday Smoker -- 1.0 packs/day  . Smokeless tobacco: Not on file  . Alcohol Use: Yes     occaisionally      Review of Systems  All other systems reviewed and are negative.    Allergies  Review of patient's allergies indicates no known allergies.  Home Medications   Current Outpatient Rx  Name Route Sig Dispense Refill  . FENTANYL 25 MCG/HR TD PT72 Transdermal Place 1 patch onto the skin every 3 (three) days.    Marland Kitchen GABAPENTIN 300 MG PO CAPS Oral Take 300 mg by mouth 3 (three) times daily.    . ADULT MULTIVITAMIN W/MINERALS CH  Oral Take 1 tablet by mouth daily.    . OXYCODONE HCL 30 MG PO TABS Oral Take 30 mg by mouth 5 (five) times daily.    Marland Kitchen ZOLPIDEM TARTRATE 10 MG PO TABS Oral Take 10 mg by mouth at bedtime.      BP 118/76  Pulse 64  Temp(Src) 98.3 F (36.8 C) (Oral)  Resp 16  SpO2 100%  Physical Exam  Nursing note and vitals reviewed. Constitutional: He is oriented to person, place, and time. He appears well-developed and well-nourished.  HENT:  Head: Normocephalic and atraumatic.  Eyes: EOM are normal. Pupils are equal, round, and reactive to light.  Neck: Normal range of motion.  Cardiovascular: Normal rate, regular rhythm, normal heart sounds and intact distal pulses.   Pulmonary/Chest: Effort normal and breath sounds normal. No respiratory distress.  Abdominal: Soft. He exhibits no distension. There is no tenderness.  Musculoskeletal: Normal range of motion.  Neurological: He is alert and oriented to person, place, and time.       5/5 strength in major muscle groups of  bilateral upper and lower extremities. Speech normal. No facial asymetry.   Skin: Skin is warm and dry.  Psychiatric: He has a normal mood and affect. Judgment normal.    ED Course  Procedures (including critical care time)  Labs Reviewed  URINALYSIS, ROUTINE W REFLEX MICROSCOPIC - Abnormal; Notable  for the following:    Color, Urine AMBER (*) BIOCHEMICALS MAY BE AFFECTED BY COLOR   Ketones, ur 15 (*)    Leukocytes, UA SMALL (*)    All other components within normal limits  CBC - Abnormal; Notable for the following:    WBC 11.3 (*)    HCT 38.3 (*)    All other components within normal limits  DIFFERENTIAL - Abnormal; Notable for the following:    Monocytes Absolute 1.1 (*)    All other components within normal limits  URINE MICROSCOPIC-ADD ON - Abnormal; Notable for the following:    Squamous Epithelial / LPF FEW (*)    Bacteria, UA FEW (*)    Crystals CA OXALATE CRYSTALS (*)    All other components within normal  limits  URINE RAPID DRUG SCREEN (HOSP PERFORMED) - Abnormal; Notable for the following:    Benzodiazepines POSITIVE (*)    Amphetamines POSITIVE (*)    All other components within normal limits  COMPREHENSIVE METABOLIC PANEL  ETHANOL   Ct Head Wo Contrast  02/29/2012  *RADIOLOGY REPORT*  Clinical Data: Dizziness and headache.  CT HEAD WITHOUT CONTRAST  Technique:  Contiguous axial images were obtained from the base of the skull through the vertex without contrast.  Comparison: None.  Findings: Ventricles and sulci appear symmetrical.  No mass effect or midline shift.  No abnormal extra-axial fluid collections.  Wallace Cullens- white matter junctions are distinct.  Basal cisterns are not effaced.  No evidence of acute intracranial hemorrhage.  No depressed skull fractures.  Visualized mastoid air cells and paranasal sinuses are not opacified.  IMPRESSION: No acute intracranial abnormalities.  Original Report Authenticated By: Marlon Pel, M.D.     1. Headache   2. Dizziness       MDM  [name] neurologic exam is normal.  His labs and head CT are normal.  He ambulated to the bathroom from his room in bed 2 without difficulty.  He still has mild headache at this time.  His UDS is positive for amphetamines which is not prescribed.  I recommended close followup with his primary care physician        Lyanne Co, MD 02/29/12 [phone]

## 2012-02-29 NOTE — ED Notes (Signed)
The pt has been given some food per his request.

## 2012-02-29 NOTE — Discharge Instructions (Signed)
Dizziness Dizziness is a common problem. It is a feeling of unsteadiness or lightheadedness. You may feel like you are about to faint. Dizziness can lead to injury if you stumble or fall. A person of any age group can suffer from dizziness, but dizziness is more common in older adults. CAUSES  Dizziness can be caused by many different things, including:  Middle ear problems.   Standing for too long.   Infections.   An allergic reaction.   Aging.   An emotional response to something, such as the sight of blood.   Side effects of medicines.   Fatigue.   Problems with circulation or blood pressure.   Excess use of alcohol, medicines, or illegal drug use.   Breathing too fast (hyperventilation).   An arrhythmia or problems with your heart rhythm.   Low red blood cell count (anemia).   Pregnancy.   Vomiting, diarrhea, fever, or other illnesses that cause dehydration.   Diseases or conditions such as Parkinson's disease, high blood pressure (hypertension), diabetes, and thyroid problems.   Exposure to extreme heat.  DIAGNOSIS  To find the cause of your dizziness, your caregiver may do a physical exam, lab tests, radiologic imaging scans, or an electrocardiography test (ECG).  TREATMENT  Treatment of dizziness depends on the cause of your symptoms and can vary greatly. HOME CARE INSTRUCTIONS   Drink enough fluids to keep your urine clear or pale yellow. This is especially important in very hot weather. In the elderly, it is also important in cold weather.   If your dizziness is caused by medicines, take them exactly as directed. When taking blood pressure medicines, it is especially important to get up slowly.   Rise slowly from chairs and steady yourself until you feel okay.   In the morning, first sit up on the side of the bed. When this seems okay, stand slowly while holding onto something until you know your balance is fine.   If you need to stand in one place for a  long time, be sure to move your legs often. Tighten and relax the muscles in your legs while standing.   If dizziness continues to be a problem, have someone stay with you for a day or two. Do this until you feel you are well enough to stay alone. Have the person call your caregiver if he or she notices changes in you that are concerning.   Do not drive or use heavy machinery if you feel dizzy.  SEEK IMMEDIATE MEDICAL CARE IF:   Your dizziness or lightheadedness gets worse.   You feel nauseous or vomit.   You develop problems with talking, walking, weakness, or using your arms, hands, or legs.   You are not thinking clearly or you have difficulty forming sentences. It may take a friend or family member to determine if your thinking is normal.   You develop chest pain, abdominal pain, shortness of breath, or sweating.   Your vision changes.   You notice any bleeding.   You have side effects from medicine that seems to be getting worse rather than better.  MAKE SURE YOU:   Understand these instructions.   Will watch your condition.   Will get help right away if you are not doing well or get worse.  Document Released: 03/29/2001 Document Revised: 09/22/2011 Document Reviewed: 04/22/2011 [employer] Patient Information 2012 [employer]  Headache, General, Unknown Cause The specific cause of your headache may not have been found today. There are many   causes and types of headache. A few common ones are:  Tension headache.   Migraine.   Infections (examples: dental and sinus infections).   Bone and/or joint problems in the neck or jaw.   Depression.   Eye problems.  These headaches are not life threatening.  Headaches can sometimes be diagnosed by a patient history and a physical exam. Sometimes, lab and imaging studies (such as x-ray and/or CT scan) are used to rule out more serious problems. In some cases, a spinal tap (lumbar puncture) may be requested. There are many  times when your exam and tests may be normal on the first visit even when there is a serious problem causing your headaches. Because of that, it is very important to follow up with your doctor or local clinic for further evaluation. FINDING OUT THE RESULTS OF TESTS  If a radiology test was performed, a radiologist will review your results.   You will be contacted by the emergency department or your physician if any test results require a change in your treatment plan.   Not all test results may be available during your visit. If your test results are not back during the visit, make an appointment with your caregiver to find out the results. Do not assume everything is normal if you have not heard from your caregiver or the medical facility. It is important for you to follow up on all of your test results.  HOME CARE INSTRUCTIONS   Keep follow-up appointments with your caregiver, or any specialist referral.   Only take over-the-counter or prescription medicines for pain, discomfort, or fever as directed by your caregiver.   Biofeedback, massage, or other relaxation techniques may be helpful.   Ice packs or heat applied to the head and neck can be used. Do this three to four times per day, or as needed.   Call your doctor if you have any questions or concerns.   If you smoke, you should quit.  SEEK MEDICAL CARE IF:   You develop problems with medications prescribed.   You do not respond to or obtain relief from medications.   You have a change from the usual headache.   You develop nausea or vomiting.  SEEK IMMEDIATE MEDICAL CARE IF:   If your headache becomes severe.   You have an unexplained oral temperature above 102 F (38.9 C), or as your caregiver suggests.   You have a stiff neck.   You have loss of vision.   You have muscular weakness.   You have loss of muscular control.   You develop severe symptoms different from your first symptoms.   You start losing your  balance or have trouble walking.   You feel faint or pass out.  MAKE SURE YOU:   Understand these instructions.   Will watch your condition.   Will get help right away if you are not doing well or get worse.  Document Released: 10/03/2005 Document Revised: 09/22/2011 Document Reviewed: 05/22/2008 Exit[employer] Patient Information 2012 [employer]. 

## 2012-02-29 NOTE — ED Notes (Signed)
The pt has gone to c-t 

## 2012-03-28 ENCOUNTER — Emergency Department (HOSPITAL_COMMUNITY): Payer: BC Managed Care – PPO

## 2012-03-28 ENCOUNTER — Emergency Department (HOSPITAL_COMMUNITY)
Admission: EM | Admit: 2012-03-28 | Discharge: 2012-03-29 | Disposition: A | Discharge status: Home / Self Care | Payer: BC Managed Care – PPO | Attending: Emergency Medicine | Admitting: Emergency Medicine

## 2012-03-28 ENCOUNTER — Encounter (HOSPITAL_COMMUNITY): Payer: Self-pay | Admitting: Emergency Medicine

## 2012-03-28 DIAGNOSIS — R109 Unspecified abdominal pain: Secondary | ICD-10-CM | POA: Insufficient documentation

## 2012-03-28 DIAGNOSIS — N390 Urinary tract infection, site not specified: Secondary | ICD-10-CM | POA: Insufficient documentation

## 2012-03-28 DIAGNOSIS — R404 Transient alteration of awareness: Secondary | ICD-10-CM | POA: Insufficient documentation

## 2012-03-28 DIAGNOSIS — R1110 Vomiting, unspecified: Secondary | ICD-10-CM | POA: Insufficient documentation

## 2012-03-28 LAB — RAPID URINE DRUG SCREEN, HOSP PERFORMED
Amphetamines: NOT DETECTED
Benzodiazepines: POSITIVE — AB
Opiates: POSITIVE — AB
Tetrahydrocannabinol: NOT DETECTED

## 2012-03-28 LAB — BASIC METABOLIC PANEL
BUN: 12 mg/dL (ref 6–23)
CO2: 22 mEq/L (ref 19–32)
Chloride: 100 mEq/L (ref 96–112)
Creatinine, Ser: 1.07 mg/dL (ref 0.50–1.35)
Potassium: 4.5 mEq/L (ref 3.5–5.1)

## 2012-03-28 LAB — DIFFERENTIAL
Lymphocytes Relative: 11 % — ABNORMAL LOW (ref 12–46)
Monocytes Absolute: 1.1 10*3/uL — ABNORMAL HIGH (ref 0.1–1.0)
Monocytes Relative: 7 % (ref 3–12)
Neutro Abs: 12.2 10*3/uL — ABNORMAL HIGH (ref 1.7–7.7)
Neutrophils Relative %: 82 % — ABNORMAL HIGH (ref 43–77)

## 2012-03-28 LAB — CBC
HCT: 48.5 % (ref 39.0–52.0)
Hemoglobin: 17.4 g/dL — ABNORMAL HIGH (ref 13.0–17.0)
RBC: 6.26 MIL/uL — ABNORMAL HIGH (ref 4.22–5.81)
WBC: 14.9 10*3/uL — ABNORMAL HIGH (ref 4.0–10.5)

## 2012-03-28 LAB — URINALYSIS, ROUTINE W REFLEX MICROSCOPIC
Glucose, UA: NEGATIVE mg/dL
Specific Gravity, Urine: 1.036 — ABNORMAL HIGH (ref 1.005–1.030)
pH: 5.5 (ref 5.0–8.0)

## 2012-03-28 LAB — URINE MICROSCOPIC-ADD ON

## 2012-03-28 LAB — LIPASE, BLOOD: Lipase: 27 U/L (ref 11–59)

## 2012-03-28 MED ORDER — ONDANSETRON HCL 4 MG/2ML IJ SOLN
4.0000 mg | Freq: Once | INTRAMUSCULAR | Status: AC
  Administered 2012-03-28: 4 mg via INTRAVENOUS
  Filled 2012-03-28: qty 2

## 2012-03-28 MED ORDER — HYDROMORPHONE HCL PF 1 MG/ML IJ SOLN
1.0000 mg | Freq: Once | INTRAMUSCULAR | Status: AC
  Administered 2012-03-28: 1 mg via INTRAVENOUS
  Filled 2012-03-28: qty 1

## 2012-03-28 MED ORDER — CEPHALEXIN 500 MG PO CAPS: 500.0000 mg | ORAL_CAPSULE | Freq: Four times a day (QID) | ORAL | Status: AC

## 2012-03-28 MED ORDER — ONDANSETRON 8 MG PO TBDP
8.0000 mg | ORAL_TABLET | Freq: Once | ORAL | Status: AC
  Administered 2012-03-28: 8 mg via ORAL
  Filled 2012-03-28: qty 1

## 2012-03-28 MED ORDER — CEPHALEXIN 500 MG PO CAPS
500.0000 mg | ORAL_CAPSULE | Freq: Once | ORAL | Status: AC
  Administered 2012-03-28: 500 mg via ORAL
  Filled 2012-03-28: qty 1

## 2012-03-28 NOTE — ED Provider Notes (Signed)
History     CSN: [id number]  Arrival date & time 03/28/12  1459   First MD Initiated Contact with Patient 03/28/12 1543      Chief Complaint  Patient presents with  . Loss of Consciousness  . Abdominal Pain  . Emesis  . Diarrhea  . Back Pain    (Consider location/radiation/quality/duration/timing/severity/associated sxs/prior treatment) HPI Comments: Jason [name] is a 31 y.o. Male who has had vomiting, numerous times today. He has chronic back and leg pain, which is unchanged from baseline. He denies fever, chills, cough, shortness of breath, chest pain. He tried his usual medication today without relief. He's been unable to tolerate any food or fluids because it makes him vomit.  The history is provided by the patient.    Past Medical History  Diagnosis Date  . Back injury     History reviewed. No pertinent past surgical history.  History reviewed. No pertinent family history.  History  Substance Use Topics  . Smoking status: Current Everyday Smoker -- 1.0 packs/day  . Smokeless tobacco: Not on file  . Alcohol Use: Yes     occaisionally      Review of Systems  All other systems reviewed and are negative.    Allergies  Review of patient's allergies indicates no known allergies.  Home Medications   Current Outpatient Rx  Name Route Sig Dispense Refill  . FENTANYL 25 MCG/HR TD PT72 Transdermal Place 1 patch onto the skin every 3 (three) days.    Marland Kitchen GABAPENTIN 300 MG PO CAPS Oral Take 300 mg by mouth 3 (three) times daily.    . ADULT MULTIVITAMIN W/MINERALS CH Oral Take 1 tablet by mouth daily.    . OXYCODONE HCL 30 MG PO TABS Oral Take 30 mg by mouth 5 (five) times daily.    Marland Kitchen ZOLPIDEM TARTRATE 10 MG PO TABS Oral Take 10 mg by mouth at bedtime.    . CEPHALEXIN 500 MG PO CAPS Oral Take 1 capsule (500 mg total) by mouth 4 (four) times daily. 500 capsule 0    BP 107/70  Pulse 70  Temp 98.6 F (37 C) (Oral)  Resp 16  SpO2 100%  Physical Exam  Nursing  note and vitals reviewed. Constitutional: He is oriented to person, place, and time. He appears well-developed and well-nourished.  HENT:  Head: Normocephalic and atraumatic.  Right Ear: External ear normal.  Left Ear: External ear normal.  Eyes: Conjunctivae and EOM are normal. Pupils are equal, round, and reactive to light.  Neck: Normal range of motion and phonation normal. Neck supple.  Cardiovascular: Normal rate, regular rhythm, normal heart sounds and intact distal pulses.   Pulmonary/Chest: Effort normal and breath sounds normal. He exhibits no bony tenderness.  Abdominal: Soft. Normal appearance. He exhibits no mass. There is no tenderness. There is no guarding.       Hypoactive bowel sounds  Musculoskeletal: Normal range of motion.  Neurological: He is alert and oriented to person, place, and time. He has normal strength. No cranial nerve deficit or sensory deficit. He exhibits normal muscle tone. Coordination normal.       Normal gait  Skin: Skin is warm, dry and intact.  Psychiatric: He has a normal mood and affect. His behavior is normal. Judgment and thought content normal.    ED Course  Procedures (including critical care time)  Emergency department treatment: IV fluids. IV, Dilaudid, IV, Zofran  Reevaluation. 19:50- he feels better, but has some residual back pain. His vomiting  has improved. Keflex, ordered for UTI, will repeat Dilaudid  Labs Reviewed  URINALYSIS, ROUTINE W REFLEX MICROSCOPIC - Abnormal; Notable for the following:    Color, Urine RED (*)  BIOCHEMICALS MAY BE AFFECTED BY COLOR   APPearance TURBID (*)     Specific Gravity, Urine 1.036 (*)     Hgb urine dipstick SMALL (*)     Bilirubin Urine SMALL (*)     Ketones, ur 15 (*)     Protein, ur 100 (*)     Nitrite POSITIVE (*)     Leukocytes, UA SMALL (*)     All other components within normal limits  CBC - Abnormal; Notable for the following:    WBC 14.9 (*)     RBC 6.26 (*)     Hemoglobin 17.4 (*)      MCV 77.5 (*)     All other components within normal limits  DIFFERENTIAL - Abnormal; Notable for the following:    Neutrophils Relative 82 (*)     Neutro Abs 12.2 (*)     Lymphocytes Relative 11 (*)     Monocytes Absolute 1.1 (*)     All other components within normal limits  BASIC METABOLIC PANEL - Abnormal; Notable for the following:    Glucose, Bld 127 (*)     Calcium 11.3 (*)     All other components within normal limits  URINE RAPID DRUG SCREEN (HOSP PERFORMED) - Abnormal; Notable for the following:    Opiates POSITIVE (*)     Benzodiazepines POSITIVE (*)     All other components within normal limits  URINE MICROSCOPIC-ADD ON - Abnormal; Notable for the following:    Squamous Epithelial / LPF FEW (*)     Bacteria, UA MANY (*)     All other components within normal limits  LIPASE, BLOOD  URINE CULTURE   Dg Abd Acute W/chest  03/28/2012  *RADIOLOGY REPORT*  Clinical Data: Abdominal pain and vomiting.  ACUTE ABDOMEN SERIES (ABDOMEN 2 VIEW & CHEST 1 VIEW)  Comparison: Chest and two views abdomen 10/21/2010.  Findings: Single view of the chest demonstrates clear lungs and normal heart size.  No pneumothorax or pleural fluid.  Bowel gas pattern is consistent with a small bowel obstruction with multiple gas-filled and mildly dilated loops of small bowel identified.  Short air fluid levels are present.  No free intraperitoneal air.  IMPRESSION: Findings compatible with small bowel obstruction.  No acute cardiopulmonary disease.  Original Report Authenticated By: Bernadene Bell. D'ALESSIO, M.D.     1. UTI (lower urinary tract infection)       MDM  Abdominal pain, with chronic back pain. Nausea improved, with treatment. Apparent UTI, and testing. Doubt pyelonephritis, sepsis, or metabolic instability. He is stable for discharge   Plan: Home Medications- Keflex; Home Treatments- rest, fluids; Recommended follow up- PCP prn        Flint Melter, MD 03/29/12 0011

## 2012-03-28 NOTE — ED Notes (Signed)
Keflex Rx changed from #500 to #40 per MD [facility] Rx

## 2012-03-28 NOTE — ED Notes (Signed)
Pt states that he woke up this am with abd pain and n/v/d.   Pt states that he passed out in car on way to ed.  Pt states he also feels weak.  Pt states he has had had 10 episodes of emesis and cannot remember how many times he had diarrhea.  Pt states he also has chronic back and leg pain.  Pt alert but drowsy.

## 2012-03-28 NOTE — ED Notes (Signed)
Called lab to verify enough urine from UA to do drug screen. Lab notified writer there was not enough urine to perform the test. Pt family notified of need for urine at this time. Pt will notify writer when able to urinate.

## 2012-03-28 NOTE — Discharge Instructions (Signed)
Drink plenty of fluids. See a primary care Dr. for a checkup and repeat urine testing in one week.   Urinary Tract Infection Infections of the urinary tract can start in several places. A bladder infection (cystitis), a kidney infection (pyelonephritis), and a prostate infection (prostatitis) are different types of urinary tract infections (UTIs). They usually get better if treated with medicines (antibiotics) that kill germs. Take all the medicine until it is gone. You or your child may feel better in a few days, but TAKE ALL MEDICINE or the infection may not respond and may become more difficult to treat. HOME CARE INSTRUCTIONS   Drink enough water and fluids to keep the urine clear or pale yellow. Cranberry juice is especially recommended, in addition to large amounts of water.   Avoid caffeine, tea, and carbonated beverages. They tend to irritate the bladder.   Alcohol may irritate the prostate.   Only take over-the-counter or prescription medicines for pain, discomfort, or fever as directed by your caregiver.  To prevent further infections:  Empty the bladder often. Avoid holding urine for long periods of time.   After a bowel movement, women should cleanse from front to back. Use each tissue only once.   Empty the bladder before and after sexual intercourse.  FINDING OUT THE RESULTS OF YOUR TEST Not all test results are available during your visit. If your or your child's test results are not back during the visit, make an appointment with your caregiver to find out the results. Do not assume everything is normal if you have not heard from your caregiver or the medical facility. It is important for you to follow up on all test results. SEEK MEDICAL CARE IF:   There is back pain.   Your baby is older than 3 months with a rectal temperature of 100.5 F (38.1 C) or higher for more than 1 day.   Your or your child's problems (symptoms) are no better in 3 days. Return sooner if you or  your child is getting worse.  SEEK IMMEDIATE MEDICAL CARE IF:   There is severe back pain or lower abdominal pain.   You or your child develops chills.   You have a fever.   Your baby is older than 3 months with a rectal temperature of 102 F (38.9 C) or higher.   Your baby is 21 months old or younger with a rectal temperature of 100.4 F (38 C) or higher.   There is nausea or vomiting.   There is continued burning or discomfort with urination.  MAKE SURE YOU:   Understand these instructions.   Will watch your condition.   Will get help right away if you are not doing well or get worse.  Document Released: 07/13/2005 Document Revised: 09/22/2011 Document Reviewed: 02/15/2007 [facility] Patient Information 2012 [place], [place].

## 2012-03-30 LAB — URINE CULTURE: Culture  Setup Time: 201306130128

## 2014-01-13 ENCOUNTER — Emergency Department: Payer: Self-pay | Admitting: Emergency Medicine

## 2014-01-13 LAB — DRUG SCREEN, URINE
AMPHETAMINES, UR SCREEN: NEGATIVE (ref ?–1000)
BENZODIAZEPINE, UR SCRN: POSITIVE (ref ?–200)
Barbiturates, Ur Screen: NEGATIVE (ref ?–200)
Cannabinoid 50 Ng, Ur ~~LOC~~: NEGATIVE (ref ?–50)
Cocaine Metabolite,Ur ~~LOC~~: NEGATIVE (ref ?–300)
MDMA (Ecstasy)Ur Screen: NEGATIVE (ref ?–500)
METHADONE, UR SCREEN: NEGATIVE (ref ?–300)
OPIATE, UR SCREEN: POSITIVE (ref ?–300)
Phencyclidine (PCP) Ur S: NEGATIVE (ref ?–25)
TRICYCLIC, UR SCREEN: NEGATIVE (ref ?–1000)

## 2014-01-13 LAB — CBC
HCT: 55.6 % — ABNORMAL HIGH (ref 40.0–52.0)
HGB: 18 g/dL (ref 13.0–18.0)
MCH: 27.1 pg (ref 26.0–34.0)
MCHC: 32.4 g/dL (ref 32.0–36.0)
MCV: 83 fL (ref 80–100)
Platelet: 298 10*3/uL (ref 150–440)
RBC: 6.66 10*6/uL — ABNORMAL HIGH (ref 4.40–5.90)
RDW: 14.5 % (ref 11.5–14.5)
WBC: 15.9 10*3/uL — ABNORMAL HIGH (ref 3.8–10.6)

## 2014-01-13 LAB — URINALYSIS, COMPLETE
BACTERIA: NONE SEEN
BILIRUBIN, UR: NEGATIVE
GLUCOSE, UR: NEGATIVE mg/dL (ref 0–75)
Hyaline Cast: 20
LEUKOCYTE ESTERASE: NEGATIVE
NITRITE: NEGATIVE
Ph: 5 (ref 4.5–8.0)
RBC,UR: 17 /HPF (ref 0–5)
SPECIFIC GRAVITY: 1.028 (ref 1.003–1.030)
SQUAMOUS EPITHELIAL: NONE SEEN

## 2014-01-13 LAB — COMPREHENSIVE METABOLIC PANEL
ANION GAP: 8 (ref 7–16)
AST: 32 U/L (ref 15–37)
Albumin: 5.3 g/dL — ABNORMAL HIGH (ref 3.4–5.0)
Alkaline Phosphatase: 84 U/L
BUN: 10 mg/dL (ref 7–18)
Bilirubin,Total: 0.6 mg/dL (ref 0.2–1.0)
CHLORIDE: 103 mmol/L (ref 98–107)
CO2: 24 mmol/L (ref 21–32)
CREATININE: 1.44 mg/dL — AB (ref 0.60–1.30)
Calcium, Total: 11.2 mg/dL — ABNORMAL HIGH (ref 8.5–10.1)
EGFR (Non-African Amer.): >60
GLUCOSE: 142 mg/dL — AB (ref 65–99)
Osmolality: 272 (ref 275–301)
Potassium: 3.4 mmol/L — ABNORMAL LOW (ref 3.5–5.1)
SGPT (ALT): 36 U/L (ref 12–78)
SODIUM: 135 mmol/L — AB (ref 136–145)
Total Protein: 10.1 g/dL — ABNORMAL HIGH (ref 6.4–8.2)

## 2014-01-13 LAB — LIPASE, BLOOD: LIPASE: 73 U/L (ref 73–393)

## 2014-01-13 LAB — ETHANOL
Ethanol %: <0.003 % (ref 0.000–0.080)
Ethanol: <3 mg/dL

## 2014-02-28 ENCOUNTER — Other Ambulatory Visit: Payer: Self-pay | Admitting: Specialist

## 2014-02-28 DIAGNOSIS — R52 Pain, unspecified: Secondary | ICD-10-CM

## 2014-03-03 ENCOUNTER — Ambulatory Visit
Admission: RE | Admit: 2014-03-03 | Discharge: 2014-03-03 | Disposition: A | Discharge status: Home / Self Care | Payer: PRIVATE HEALTH INSURANCE | Source: Ambulatory Visit | Attending: Specialist | Admitting: Specialist

## 2014-03-03 DIAGNOSIS — R52 Pain, unspecified: Secondary | ICD-10-CM

## 2014-06-30 ENCOUNTER — Encounter (HOSPITAL_COMMUNITY): Payer: Self-pay | Admitting: Emergency Medicine

## 2014-06-30 ENCOUNTER — Telehealth: Payer: Self-pay | Admitting: Internal Medicine

## 2014-06-30 ENCOUNTER — Emergency Department (HOSPITAL_COMMUNITY)
Admission: EM | Admit: 2014-06-30 | Discharge: 2014-06-30 | Disposition: A | Discharge status: Home / Self Care | Payer: BC Managed Care – PPO | Attending: Emergency Medicine | Admitting: Emergency Medicine

## 2014-06-30 DIAGNOSIS — F4323 Adjustment disorder with mixed anxiety and depressed mood: Secondary | ICD-10-CM | POA: Diagnosis not present

## 2014-06-30 DIAGNOSIS — F17200 Nicotine dependence, unspecified, uncomplicated: Secondary | ICD-10-CM | POA: Insufficient documentation

## 2014-06-30 DIAGNOSIS — R45850 Homicidal ideations: Secondary | ICD-10-CM | POA: Insufficient documentation

## 2014-06-30 DIAGNOSIS — Z87828 Personal history of other (healed) physical injury and trauma: Secondary | ICD-10-CM | POA: Insufficient documentation

## 2014-06-30 DIAGNOSIS — Z79899 Other long term (current) drug therapy: Secondary | ICD-10-CM | POA: Insufficient documentation

## 2014-06-30 LAB — URINE MICROSCOPIC-ADD ON

## 2014-06-30 LAB — CBC WITH DIFFERENTIAL/PLATELET
BASOS PCT: 0 % (ref 0–1)
Basophils Absolute: 0 10*3/uL (ref 0.0–0.1)
EOS PCT: 0 % (ref 0–5)
Eosinophils Absolute: 0 10*3/uL (ref 0.0–0.7)
HEMATOCRIT: 44.5 % (ref 39.0–52.0)
HEMOGLOBIN: 15.8 g/dL (ref 13.0–17.0)
Lymphocytes Relative: 15 % (ref 12–46)
Lymphs Abs: 1.5 10*3/uL (ref 0.7–4.0)
MCH: 27.6 pg (ref 26.0–34.0)
MCHC: 35.5 g/dL (ref 30.0–36.0)
MCV: 77.8 fL — ABNORMAL LOW (ref 78.0–100.0)
MONOS PCT: 9 % (ref 3–12)
Monocytes Absolute: 0.9 10*3/uL (ref 0.1–1.0)
NEUTROS ABS: 7.6 10*3/uL (ref 1.7–7.7)
NEUTROS PCT: 76 % (ref 43–77)
Platelets: 320 10*3/uL (ref 150–400)
RBC: 5.72 MIL/uL (ref 4.22–5.81)
RDW: 13.7 % (ref 11.5–15.5)
WBC: 10 10*3/uL (ref 4.0–10.5)

## 2014-06-30 LAB — RAPID URINE DRUG SCREEN, HOSP PERFORMED
Amphetamines: NOT DETECTED
Barbiturates: NOT DETECTED
Benzodiazepines: NOT DETECTED
Cocaine: NOT DETECTED
Opiates: NOT DETECTED
TETRAHYDROCANNABINOL: NOT DETECTED

## 2014-06-30 LAB — COMPREHENSIVE METABOLIC PANEL
ALK PHOS: 61 U/L (ref 39–117)
ALT: 12 U/L (ref 0–53)
ANION GAP: 18 — AB (ref 5–15)
AST: 16 U/L (ref 0–37)
Albumin: 4.5 g/dL (ref 3.5–5.2)
BUN: 18 mg/dL (ref 6–23)
CHLORIDE: 96 meq/L (ref 96–112)
CO2: 23 mEq/L (ref 19–32)
Calcium: 10 mg/dL (ref 8.4–10.5)
Creatinine, Ser: 1.03 mg/dL (ref 0.50–1.35)
GFR calc non Af Amer: >90 mL/min (ref >90)
GLUCOSE: 117 mg/dL — AB (ref 70–99)
Potassium: 3.4 mEq/L — ABNORMAL LOW (ref 3.7–5.3)
Sodium: 137 mEq/L (ref 137–147)
Total Bilirubin: 0.7 mg/dL (ref 0.3–1.2)
Total Protein: 8.6 g/dL — ABNORMAL HIGH (ref 6.0–8.3)

## 2014-06-30 LAB — URINALYSIS, ROUTINE W REFLEX MICROSCOPIC
BILIRUBIN URINE: NEGATIVE
Glucose, UA: NEGATIVE mg/dL
Ketones, ur: 15 mg/dL — AB
Leukocytes, UA: NEGATIVE
Nitrite: NEGATIVE
PROTEIN: NEGATIVE mg/dL
SPECIFIC GRAVITY, URINE: 1.025 (ref 1.005–1.030)
Urobilinogen, UA: 0.2 mg/dL (ref 0.0–1.0)
pH: 6 (ref 5.0–8.0)

## 2014-06-30 LAB — ACETAMINOPHEN LEVEL

## 2014-06-30 LAB — SALICYLATE LEVEL

## 2014-06-30 LAB — ETHANOL: Alcohol, Ethyl (B): <11 mg/dL (ref 0–11)

## 2014-06-30 LAB — LIPASE, BLOOD: Lipase: 16 U/L (ref 11–59)

## 2014-06-30 MED ORDER — OXYCODONE-ACETAMINOPHEN 5-325 MG PO TABS
2.0000 | ORAL_TABLET | Freq: Once | ORAL | Status: AC
  Administered 2014-06-30: 2 via ORAL
  Filled 2014-06-30: qty 2

## 2014-06-30 MED ORDER — DEXTROSE 5 % IV BOLUS
500.0000 mL | Freq: Once | INTRAVENOUS | Status: AC
  Administered 2014-06-30: 500 mL via INTRAVENOUS
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
## 2014-06-30 NOTE — Telephone Encounter (Signed)
Ok , but please be aware I do not practice chronic pain medicine anymore, and I do not prescribe schedule II or higher medications for chronic pain
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
## 2014-06-30 NOTE — ED Provider Notes (Signed)
CSN: [id number]     Arrival date & time 06/30/14  0111 History   First MD Initiated Contact with Patient 06/30/14 0303     Chief Complaint  Patient presents with  . Emesis  . Diarrhea  . Homicidal     (Consider location/radiation/quality/duration/timing/severity/associated sxs/prior Treatment) HPI  Jason [name] is a 33 y.o. male with no significant medical history coming in with vomiting and diarrhea. Patient has abdominal pain as well. It is diffuse and stabbing in nature. Associated with nonbloody emesis. He states 20-30 times today. Also has diarrhea 20-30 times today, nonbloody. He said states she's felt weak as a result. He denies any fevers sweating or sick contacts. He has no history of abdominal disease. He is currently denying any symptoms for nausea, or the desire to have bowel movement. Patient states abdominal pain is completely resolved. Patient is also here for homicidal ideation. He is very vague in his history with me, but states they're somewhat he plans to hurt. Patient does have access to a gun at home.  10 Systems reviewed and are negative for acute change except as noted in the HPI.     Past Medical History  Diagnosis Date  . Back injury    History reviewed. No pertinent past surgical history. No family history on file. History  Substance Use Topics  . Smoking status: Current Every Day Smoker -- 1.00 packs/day  . Smokeless tobacco: Never Used  . Alcohol Use: Yes     Comment: occaisionally    Review of Systems    Allergies  Review of patient's allergies indicates no known allergies.  Home Medications   Prior to Admission medications   Medication Sig Start Date End Date Taking? Authorizing Provider  fentaNYL (DURAGESIC - DOSED MCG/HR) 25 MCG/HR Place 25 mcg onto the skin every 3 (three) days.    Yes Historical Provider, MD  oxycodone (ROXICODONE) 30 MG immediate release tablet Take 30 mg by mouth 5 (five) times daily.   Yes Historical Provider, MD   zolpidem (AMBIEN) 10 MG tablet Take 10 mg by mouth at bedtime.   Yes Historical Provider, MD   BP 111/81  Pulse 86  Temp(Src) 99.8 F (37.7 C) (Oral)  Resp 18  Ht 5\' 8"  (1.727 m)  Wt 145 lb (65.772 kg)  BMI 22.05 kg/m2  SpO2 97% Physical Exam  Nursing note and vitals reviewed. Constitutional: He is oriented to person, place, and time. Vital signs are normal. He appears well-developed and well-nourished.  Non-toxic appearance. He does not appear ill. No distress.  HENT:  Head: Normocephalic and atraumatic.  Nose: Nose normal.  Mouth/Throat: Oropharynx is clear and moist. No oropharyngeal exudate.  Eyes: Conjunctivae and EOM are normal. Pupils are equal, round, and reactive to light. No scleral icterus.  Neck: Normal range of motion. Neck supple. No tracheal deviation, no edema, no erythema and normal range of motion present. No mass and no thyromegaly present.  Cardiovascular: Normal rate, regular rhythm, S1 normal, S2 normal, normal heart sounds, intact distal pulses and normal pulses.  Exam reveals no gallop and no friction rub.   No murmur heard. Pulses:      Radial pulses are 2+ on the right side, and 2+ on the left side.       Dorsalis pedis pulses are 2+ on the right side, and 2+ on the left side.  Pulmonary/Chest: Effort normal and breath sounds normal. No respiratory distress. He has no wheezes. He has no rhonchi. He has no rales.  Abdominal: Soft. Normal appearance and bowel sounds are normal. He exhibits no distension, no ascites and no mass. There is no hepatosplenomegaly. There is no tenderness. There is no rebound, no guarding and no CVA tenderness.  Musculoskeletal: Normal range of motion. He exhibits no edema and no tenderness.  Lymphadenopathy:    He has no cervical adenopathy.  Neurological: He is alert and oriented to person, place, and time. He has normal strength. No cranial nerve deficit or sensory deficit. GCS eye subscore is 4. GCS verbal subscore is 5. GCS motor  subscore is 6.  Skin: Skin is warm, dry and intact. No petechiae and no rash noted. He is not diaphoretic. No erythema. No pallor.  Psychiatric: He has a normal mood and affect. His behavior is normal. Judgment normal.    ED Course  Procedures (including critical care time) Labs Review Labs Reviewed  CBC WITH DIFFERENTIAL - Abnormal; Notable for the following:    MCV 77.8 (*)    All other components within normal limits  COMPREHENSIVE METABOLIC PANEL - Abnormal; Notable for the following:    Potassium 3.4 (*)    Glucose, Bld 117 (*)    Total Protein 8.6 (*)    Anion gap 18 (*)    All other components within normal limits  URINALYSIS, ROUTINE W REFLEX MICROSCOPIC - Abnormal; Notable for the following:    Hgb urine dipstick MODERATE (*)    Ketones, ur 15 (*)    All other components within normal limits  SALICYLATE LEVEL - Abnormal; Notable for the following:    Salicylate Lvl <2.4 (*)    All other components within normal limits  LIPASE, BLOOD  ACETAMINOPHEN LEVEL  ETHANOL  URINE RAPID DRUG SCREEN (HOSP PERFORMED)  URINE MICROSCOPIC-ADD ON    Imaging Review No results found.   EKG Interpretation None      MDM   Final diagnoses:  None    Patient presents today out of concern for abdominal pain nausea vomiting diarrhea, all of which have subsided. He is denying any sick contacts. On exam he has a nontender abdomen and appears in no acute distress. Laboratory vitals are unremarkable. A low concern for an acute abdomen. Patient also has homicidal ideation, he was evaluated by TTS who recommends for the patient to stay until the morning for psychiatry evaluation. Patient told TTS that he does have access to guns, and he told the nursing staff uses waiting for the right time to hurt somebody. He has been very vague to all hospital staff as to who he plans to hurt. There's been no agitation in the emergency department, patient will be retained until psychiatry evaluation. He was  given oxycodone, his home medication, for his chronic back pain.    Everlene Balls, MD 06/30/14 [phone]

## 2014-06-30 NOTE — Telephone Encounter (Signed)
Patient would like to know if Dr. Jenny Reichmann would take him back on as a patient.

## 2014-06-30 NOTE — ED Notes (Addendum)
Pt states that he has been feeling weak with n/v/d. Pt states that he also having right shoulder pain. Pt states that he gets urges to hurt other people. States that he is feeling that way now. Denies SI.

## 2014-06-30 NOTE — [facility] Suicide Risk Assessment (Signed)
   Demographic Factors:  Male and Unemployed  Total Time spent with patient: 20 minutes  Psychiatric Specialty Exam: Physical Exam  Psychiatric: His speech is normal and behavior is normal. Judgment and thought content normal. His mood appears anxious. Cognition and memory are normal.    Review of Systems  Constitutional: Negative.   HENT: Negative.   Eyes: Negative.   Respiratory: Negative.   Cardiovascular: Negative.   Gastrointestinal: Negative.   Genitourinary: Negative.   Musculoskeletal: Negative.   Skin: Negative.   Neurological: Negative.   Endo/Heme/Allergies: Negative.   Psychiatric/Behavioral: Negative.     Blood pressure 110/53, pulse 90, temperature 99 F (37.2 C), temperature source Oral, resp. rate 18, height 5\' 8"  (1.727 m), weight 65.772 kg (145 lb), SpO2 99.00%.Body mass index is 22.05 kg/(m^2).  General Appearance: Fairly Groomed  Engineer, water::  Good  Speech:  Clear and Coherent  Volume:  Normal  Mood:  Anxious  Affect:  Appropriate  Thought Process:  Goal Directed  Orientation:  Full (Time, Place, and Person)  Thought Content:  Negative  Suicidal Thoughts:  No  Homicidal Thoughts:  No  Memory:  Immediate;   Fair Recent;   Fair Remote;   Fair  Judgement:  Fair  Insight:  Fair  Psychomotor Activity:  Normal  Concentration:  Fair  Recall:  [employer] of Knowledge:Good  Language: Good  Akathisia:  No  Handed:  Right  AIMS (if indicated):     Assets:  Communication Skills Desire for Improvement Physical Health  Sleep:       Musculoskeletal: Strength & Muscle Tone: within normal limits Gait & Station: normal Patient leans: N/A   Mental Status Per Nursing Assessment::   On Admission:     Current Mental Status by Physician: patient denies suicidal or homicidal thoughts  Loss Factors: Financial problems/change in socioeconomic status  Historical Factors: NA  Risk Reduction Factors:   Sense of responsibility to family, Living with  another person, especially a relative and Positive social support  Continued Clinical Symptoms:  Resolving anxiety and depressive symptoms  Cognitive Features That Contribute To Risk:  Closed-mindedness    Suicide Risk:  Minimal: No identifiable suicidal ideation.  Patients presenting with no risk factors but with morbid ruminations; may be classified as minimal risk based on the severity of the depressive symptoms  Discharge Diagnoses:   AXIS I:  Adjustment disorder with mixed anxiety and depression  AXIS II:  Deferred AXIS III:   Past Medical History  Diagnosis Date  . Back injury    AXIS IV:  other psychosocial or environmental problems and problems related to social environment AXIS V:  61-70 mild symptoms  Plan Of Care/Follow-up recommendations:  Activity:  as tolerated Diet:  healthy  Is patient on multiple antipsychotic therapies at discharge:  No   Has Patient had three or more failed trials of antipsychotic monotherapy by history:  No  Recommended Plan for Multiple Antipsychotic Therapies: NA    Corena Pilgrim, MD 06/30/2014, 11:30 AM

## 2014-06-30 NOTE — ED Notes (Addendum)
Pt is actively endorsing HI however is very vague about who his HI is directed toward but says that the individual is very close to him.  Pt says he does have a plan however will not share said plan.  Pt is agitated and restless at this time.  Lights turned out warm blanket given will continue to monitor.

## 2014-06-30 NOTE — Discharge Instructions (Signed)
Adjustment Disorder °Most changes in life can cause stress. Getting used to changes may take a few months or longer. If feelings of stress, hopelessness, or worry continue, you may have an adjustment disorder. This stress-related mental health problem may affect your feelings, thinking and how you act. It occurs in both sexes and happens at any age. °SYMPTOMS  °Some of the following problems may be seen and vary from person to person: °· Sadness or depression. °· Loss of enjoyment. °· Thoughts of suicide. °· Fighting. °· Avoiding family and friends. °· Poor school performance. °· Hopelessness, sense of loss. °· Trouble sleeping. °· Vandalism. °· Worry, weight loss or gain. °· Crying spells. °· Anxiety °· Reckless driving. °· Skipping school. °· Poor work performance. °· Nervousness. °· Ignoring bills. °· Poor attitude. °DIAGNOSIS  °Your caregiver will ask what has happened in your life and do a physical exam. They will make a diagnosis of an adjustment disorder when they are sure another problem or medical illness causing your feelings does not exist. °TREATMENT  °When problems caused by stress interfere with you daily life or last longer than a few months, you may need counseling for an adjustment disorder. Early treatment may diminish problems and help you to better cope with the stressful events in your life. Sometimes medication is necessary. Individual counseling and or support groups can be very helpful. °PROGNOSIS  °Adjustment disorders usually last less than 3 to 6 months. The condition may persist if there is long lasting stress. This could include health problems, relationship problems, or job difficulties where you can not easily escape from what is causing the problem. °PREVENTION  °Even the most mentally healthy, highly functioning people can suffer from an adjustment disorder given a significant blow from a life-changing event. There is no way to prevent pain and loss. Most people need help from time  to time. You are not alone. °SEEK MEDICAL CARE IF:  °Your feelings or symptoms listed above do not improve or worsen. °Document Released: 06/07/2006 Document Revised: 12/26/2011 Document Reviewed: 08/29/2007 °[employer]® Patient Information ©2015 [employer]. This information is not intended to replace advice given to you by your health care provider. Make sure you discuss any questions you have with your health care provider. ° °

## 2014-06-30 NOTE — ED Notes (Signed)
Pt has in belonging bag:  Blue jeans, blue strip shirt, white shoes, white socks, black phone, [place] cigs, black wallet ([place] driver lice, one five dollar bill, 4 one dollar bills).

## 2014-07-01 NOTE — Telephone Encounter (Signed)
Left patient vm to call back to schedule appt  °

## 2014-07-14 ENCOUNTER — Institutional Professional Consult (permissible substitution): Payer: BC Managed Care – PPO | Admitting: Diagnostic Neuroimaging

## 2015-02-12 ENCOUNTER — Encounter (HOSPITAL_COMMUNITY): Payer: Self-pay | Admitting: *Deleted

## 2015-02-12 ENCOUNTER — Emergency Department (HOSPITAL_COMMUNITY): Payer: PRIVATE HEALTH INSURANCE

## 2015-02-12 ENCOUNTER — Emergency Department (HOSPITAL_COMMUNITY)
Admission: EM | Admit: 2015-02-12 | Discharge: 2015-02-12 | Disposition: A | Discharge status: Home / Self Care | Payer: Self-pay | Attending: Emergency Medicine | Admitting: Emergency Medicine

## 2015-02-12 DIAGNOSIS — R109 Unspecified abdominal pain: Secondary | ICD-10-CM | POA: Insufficient documentation

## 2015-02-12 DIAGNOSIS — Z79899 Other long term (current) drug therapy: Secondary | ICD-10-CM | POA: Insufficient documentation

## 2015-02-12 DIAGNOSIS — Z720 Tobacco use: Secondary | ICD-10-CM | POA: Insufficient documentation

## 2015-02-12 DIAGNOSIS — R112 Nausea with vomiting, unspecified: Secondary | ICD-10-CM | POA: Insufficient documentation

## 2015-02-12 DIAGNOSIS — N179 Acute kidney failure, unspecified: Secondary | ICD-10-CM | POA: Insufficient documentation

## 2015-02-12 DIAGNOSIS — R1084 Generalized abdominal pain: Secondary | ICD-10-CM

## 2015-02-12 DIAGNOSIS — G8929 Other chronic pain: Secondary | ICD-10-CM | POA: Insufficient documentation

## 2015-02-12 DIAGNOSIS — F1110 Opioid abuse, uncomplicated: Secondary | ICD-10-CM | POA: Insufficient documentation

## 2015-02-12 HISTORY — DX: Dorsalgia, unspecified: M54.9

## 2015-02-12 HISTORY — DX: Other chronic pain: G89.29

## 2015-02-12 LAB — ETHANOL: Alcohol, Ethyl (B): <5 mg/dL (ref 0–9)

## 2015-02-12 LAB — CBC WITH DIFFERENTIAL/PLATELET
BASOS ABS: 0 10*3/uL (ref 0.0–0.1)
BASOS PCT: 0 % (ref 0–1)
EOS ABS: 0 10*3/uL (ref 0.0–0.7)
Eosinophils Relative: 0 % (ref 0–5)
HCT: 49.6 % (ref 39.0–52.0)
Hemoglobin: 17.6 g/dL — ABNORMAL HIGH (ref 13.0–17.0)
Lymphocytes Relative: 8 % — ABNORMAL LOW (ref 12–46)
Lymphs Abs: 1.4 10*3/uL (ref 0.7–4.0)
MCH: 27.8 pg (ref 26.0–34.0)
MCHC: 35.5 g/dL (ref 30.0–36.0)
MCV: 78.5 fL (ref 78.0–100.0)
MONO ABS: 1.1 10*3/uL — AB (ref 0.1–1.0)
Monocytes Relative: 7 % (ref 3–12)
NEUTROS PCT: 85 % — AB (ref 43–77)
Neutro Abs: 13.9 10*3/uL — ABNORMAL HIGH (ref 1.7–7.7)
Platelets: 347 10*3/uL (ref 150–400)
RBC: 6.32 MIL/uL — ABNORMAL HIGH (ref 4.22–5.81)
RDW: 14.1 % (ref 11.5–15.5)
WBC: 16.3 10*3/uL — ABNORMAL HIGH (ref 4.0–10.5)

## 2015-02-12 LAB — URINALYSIS, ROUTINE W REFLEX MICROSCOPIC
BILIRUBIN URINE: NEGATIVE
Glucose, UA: NEGATIVE mg/dL
KETONES UR: NEGATIVE mg/dL
Leukocytes, UA: NEGATIVE
Nitrite: NEGATIVE
PROTEIN: NEGATIVE mg/dL
UROBILINOGEN UA: 0.2 mg/dL (ref 0.0–1.0)
pH: 5.5 (ref 5.0–8.0)

## 2015-02-12 LAB — RAPID URINE DRUG SCREEN, HOSP PERFORMED
Amphetamines: NOT DETECTED
BARBITURATES: NOT DETECTED
Benzodiazepines: NOT DETECTED
Cocaine: NOT DETECTED
Opiates: POSITIVE — AB
Tetrahydrocannabinol: NOT DETECTED

## 2015-02-12 LAB — COMPREHENSIVE METABOLIC PANEL
ALK PHOS: 62 U/L (ref 39–117)
ALT: 18 U/L (ref 0–53)
AST: 27 U/L (ref 0–37)
Albumin: 5.5 g/dL — ABNORMAL HIGH (ref 3.5–5.2)
Anion gap: 16 — ABNORMAL HIGH (ref 5–15)
BUN: 27 mg/dL — ABNORMAL HIGH (ref 6–23)
CALCIUM: 10.3 mg/dL (ref 8.4–10.5)
CO2: 21 mmol/L (ref 19–32)
Chloride: 106 mmol/L (ref 96–112)
Creatinine, Ser: 1.71 mg/dL — ABNORMAL HIGH (ref 0.50–1.35)
GFR calc Af Amer: 59 mL/min — ABNORMAL LOW (ref >90)
GFR, EST NON AFRICAN AMERICAN: 51 mL/min — AB (ref >90)
Glucose, Bld: 121 mg/dL — ABNORMAL HIGH (ref 70–99)
Potassium: 5 mmol/L (ref 3.5–5.1)
SODIUM: 143 mmol/L (ref 135–145)
Total Bilirubin: 1.1 mg/dL (ref 0.3–1.2)
Total Protein: 9.5 g/dL — ABNORMAL HIGH (ref 6.0–8.3)

## 2015-02-12 LAB — URINE MICROSCOPIC-ADD ON

## 2015-02-12 LAB — LIPASE, BLOOD: Lipase: 16 U/L (ref 11–59)

## 2015-02-12 LAB — CK: Total CK: 606 U/L — ABNORMAL HIGH (ref 7–232)

## 2015-02-12 MED ORDER — SODIUM CHLORIDE 0.9 % IV BOLUS (SEPSIS)
1000.0000 mL | Freq: Once | INTRAVENOUS | Status: AC
  Administered 2015-02-12: 1000 mL via INTRAVENOUS

## 2015-02-12 MED ORDER — ONDANSETRON 4 MG PO TBDP: 4.0000 mg | ORAL_TABLET | Freq: Three times a day (TID) | ORAL | Status: DC | PRN

## 2015-02-12 MED ORDER — IOHEXOL 300 MG/ML  SOLN
50.0000 mL | Freq: Once | INTRAMUSCULAR | Status: AC | PRN
  Administered 2015-02-12: 50 mL via ORAL

## 2015-02-12 MED ORDER — DICYCLOMINE HCL 10 MG/ML IM SOLN
20.0000 mg | Freq: Once | INTRAMUSCULAR | Status: AC
  Administered 2015-02-12: 20 mg via INTRAMUSCULAR
  Filled 2015-02-12: qty 2

## 2015-02-12 MED ORDER — ONDANSETRON 4 MG PO TBDP
4.0000 mg | ORAL_TABLET | Freq: Once | ORAL | Status: AC
  Administered 2015-02-12: 4 mg via ORAL
  Filled 2015-02-12: qty 1

## 2015-02-12 MED ORDER — METOCLOPRAMIDE HCL 5 MG/ML IJ SOLN
10.0000 mg | Freq: Once | INTRAMUSCULAR | Status: AC
  Administered 2015-02-12: 10 mg via INTRAVENOUS
  Filled 2015-02-12: qty 2

## 2015-02-12 MED ORDER — ONDANSETRON HCL 4 MG/2ML IJ SOLN
4.0000 mg | Freq: Once | INTRAMUSCULAR | Status: AC
  Administered 2015-02-12: 4 mg via INTRAVENOUS
  Filled 2015-02-12: qty 2

## 2015-02-12 MED ORDER — KETOROLAC TROMETHAMINE 30 MG/ML IJ SOLN
30.0000 mg | Freq: Once | INTRAMUSCULAR | Status: AC
  Administered 2015-02-12: 30 mg via INTRAVENOUS
  Filled 2015-02-12: qty 1

## 2015-02-12 MED ORDER — IOHEXOL 300 MG/ML  SOLN
100.0000 mL | Freq: Once | INTRAMUSCULAR | Status: AC | PRN
  Administered 2015-02-12: 100 mL via INTRAVENOUS

## 2015-02-12 NOTE — ED Notes (Signed)
Bed: WA06 Expected date:  Expected time:  Means of arrival:  Comments: EMS- 34yo M, n/v/d

## 2015-02-12 NOTE — ED Provider Notes (Signed)
CSN: [id number]     Arrival date & time 02/12/15  1145 History   First MD Initiated Contact with Patient 02/12/15 1150     Chief Complaint  Patient presents with  . Abdominal Pain  . Emesis  . Diarrhea  . Nausea     (Consider location/radiation/quality/duration/timing/severity/associated sxs/prior Treatment) HPI  Jason [name] is a 33 y.o. male with PMH of chronic back pain presenting via EMS from patient's pain clinic today. He had an appointment but I felt he was walking on steadily and sleepy but call EMS. Patient with complaint of diffuse abdominal pain starting yesterday with associated nausea, vomiting, diarrhea. Patient states he was evaluated in cells per ED with unknown diagnosis. Patient has not had his pain medicines location was for 2 days now. Symptoms started after he stopped taking his oxycodone. Patient endorses subjective fevers and chills.   Past Medical History  Diagnosis Date  . Back injury   . Chronic back pain    History reviewed. No pertinent past surgical history. History reviewed. No pertinent family history. History  Substance Use Topics  . Smoking status: Current Every Day Smoker -- 1.00 packs/day  . Smokeless tobacco: Never Used  . Alcohol Use: Yes     Comment: occaisionally    Review of Systems 10 Systems reviewed and are negative for acute change except as noted in the HPI.    Allergies  Review of patient's allergies indicates no known allergies.  Home Medications   Prior to Admission medications   Medication Sig Start Date End Date Taking? Authorizing Provider  fentaNYL (DURAGESIC - DOSED MCG/HR) 25 MCG/HR Place 25 mcg onto the skin every 3 (three) days.     Historical Provider, MD  oxycodone (ROXICODONE) 30 MG immediate release tablet Take 30 mg by mouth 5 (five) times daily.    Historical Provider, MD  zolpidem (AMBIEN) 10 MG tablet Take 10 mg by mouth at bedtime.    Historical Provider, MD   BP 118/76 mmHg  Pulse 66  Temp(Src) 99.9  F (37.7 C)  Resp 18  SpO2 100% Physical Exam  Constitutional: He appears well-developed and well-nourished. No distress.  HENT:  Head: Normocephalic and atraumatic.  Mouth/Throat: Oropharynx is clear and moist.  Eyes: Conjunctivae and EOM are normal. Right eye exhibits no discharge. Left eye exhibits no discharge.  Cardiovascular: Normal rate and regular rhythm.   Pulmonary/Chest: Effort normal and breath sounds normal. No respiratory distress. He has no wheezes.  Abdominal: Soft. Bowel sounds are normal. He exhibits no distension.  Diffuse abdominal tenderness. Without rebound, rigidity or guarding.  Neurological: He is alert. He exhibits normal muscle tone. Coordination normal.  Skin: Skin is warm and dry. He is not diaphoretic.  Nursing note and vitals reviewed.   ED Course  Procedures (including critical care time) Labs Review Labs Reviewed - No data to display  Imaging Review No results found.   EKG Interpretation None      MDM   Final diagnoses:  None   Mother stated labs were performed yesterday at hospital and requested deferral of labs today until review of prior labs. Mother unable to find the labs. Labs ordered. Patient with diffuse abdominal tenderness with episode of emesis shortly before arrival. Patient endorses nausea. NS, reglan, toradol ordered.  2:53 PM Pt reassessed and reports pain reduced. On exam patient with diffuse abdominal tenderness without any improvement. With voluntary guarding. Patient with leukocytosis of 16.3. He has evidence of dehydration and acute kidney injury likely due to  Dehydration. Pt symptoms could be due to narcotic withdrawal however pt with temp 99.0, leukocytosis and persistent pain. CT abdomen ordered. Ambulated patient to the bathroom without difficulty. However patient did not give a urine sample. He stated he just couldn't.   Pt signed out to Will Dansie PA-C at shift change with plan for CT and disposition accordingly.  Discussed with pt no narcotics in ED or scripts.     Al Corpus, PA-C 02/12/15 [place], MD 02/13/15 [phone]

## 2015-02-12 NOTE — Discharge Instructions (Signed)
Viral Gastroenteritis Viral gastroenteritis is also known as stomach flu. This condition affects the stomach and intestinal tract. It can cause sudden diarrhea and vomiting. The illness typically lasts 3 to 8 days. Most people develop an immune response that eventually gets rid of the virus. While this natural response develops, the virus can make you quite ill. CAUSES  Many different viruses can cause gastroenteritis, such as rotavirus or noroviruses. You can catch one of these viruses by consuming contaminated food or water. You may also catch a virus by sharing utensils or other personal items with an infected person or by touching a contaminated surface. SYMPTOMS  The most common symptoms are diarrhea and vomiting. These problems can cause a severe loss of body fluids (dehydration) and a body salt (electrolyte) imbalance. Other symptoms may include:  Fever.  Headache.  Fatigue.  Abdominal pain. DIAGNOSIS  Your caregiver can usually diagnose viral gastroenteritis based on your symptoms and a physical exam. A stool sample may also be taken to test for the presence of viruses or other infections. TREATMENT  This illness typically goes away on its own. Treatments are aimed at rehydration. The most serious cases of viral gastroenteritis involve vomiting so severely that you are not able to keep fluids down. In these cases, fluids must be given through an intravenous line (IV). HOME CARE INSTRUCTIONS   Drink enough fluids to keep your urine clear or pale yellow. Drink small amounts of fluids frequently and increase the amounts as tolerated.  Ask your caregiver for specific rehydration instructions.  Avoid:  Foods high in sugar.  Alcohol.  Carbonated drinks.  Tobacco.  Juice.  Caffeine drinks.  Extremely hot or cold fluids.  Fatty, greasy foods.  Too much intake of anything at one time.  Dairy products until 24 to 48 hours after diarrhea stops.  You may consume probiotics.  Probiotics are active cultures of beneficial bacteria. They may lessen the amount and number of diarrheal stools in adults. Probiotics can be found in yogurt with active cultures and in supplements.  Wash your hands well to avoid spreading the virus.  Only take over-the-counter or prescription medicines for pain, discomfort, or fever as directed by your caregiver. Do not give aspirin to children. Antidiarrheal medicines are not recommended.  Ask your caregiver if you should continue to take your regular prescribed and over-the-counter medicines.  Keep all follow-up appointments as directed by your caregiver. SEEK IMMEDIATE MEDICAL CARE IF:   You are unable to keep fluids down.  You do not urinate at least once every 6 to 8 hours.  You develop shortness of breath.  You notice blood in your stool or vomit. This may look like coffee grounds.  You have abdominal pain that increases or is concentrated in one small area (localized).  You have persistent vomiting or diarrhea.  You have a fever.  The patient is a child younger than 3 months, and he or she has a fever.  The patient is a child older than 3 months, and he or she has a fever and persistent symptoms.  The patient is a child older than 3 months, and he or she has a fever and symptoms suddenly get worse.  The patient is a baby, and he or she has no tears when crying. MAKE SURE YOU:   Understand these instructions.  Will watch your condition.  Will get help right away if you are not doing well or get worse. Document Released: 10/03/2005 Document Revised: 12/26/2011 Document Reviewed: 07/20/2011  [employer] Patient Information 2015 [place]. This information is not intended to replace advice given to you by your health care provider. Make sure you discuss any questions you have with your health care provider.  Acute Kidney Injury Acute kidney injury is a disease in which there is sudden (acute) damage to the kidneys.  The kidneys are 2 organs that lie on either side of the spine between the middle of the back and the front of the abdomen. The kidneys:  Remove wastes and extra water from the blood.   Produce important hormones. These help keep bones strong, regulate blood pressure, and help create red blood cells.   Balance the fluids and chemicals in the blood and tissues. A small amount of kidney damage may not cause problems, but a large amount of damage may make it difficult or impossible for the kidneys to work the way they should. Acute kidney injury may develop into long-lasting (chronic) kidney disease. It may also develop into a life-threatening disease called end-stage kidney disease. Acute kidney injury can get worse very quickly, so it should be treated right away. Early treatment may prevent other kidney diseases from developing.  CAUSES   A problem with blood flow to the kidneys. This may be caused by:   Blood loss.   Heart disease.   Severe burns.   Liver disease.  Direct damage to the kidneys. This may be caused by:  Some medicines.   A kidney infection.   Poisoning or consuming toxic substances.   A surgical wound.   A blow to the kidney area.   A problem with urine flow. This may be caused by:   Cancer.   Kidney stones.   An enlarged prostate. SYMPTOMS   Swelling (edema) of the legs, ankles, or feet.   Tiredness (lethargy).   Nausea or vomiting.   Confusion.   Problems with urination, such as:   Painful or burning feeling during urination.   Decreased urine production.   Frequent accidents in children who are potty trained.   Bloody urine.   Muscle twitches and cramps.   Shortness of breath.   Seizures.   Chest pain or pressure. Sometimes, no symptoms are present. DIAGNOSIS Acute kidney injury may be detected and diagnosed by tests, including blood, urine, imaging, or kidney biopsy tests.  TREATMENT Treatment of  acute kidney injury varies depending on the cause and severity of the kidney damage. In mild cases, no treatment may be needed. The kidneys may heal on their own. If acute kidney injury is more severe, your caregiver will treat the cause of the kidney damage, help the kidneys heal, and prevent complications from occurring. Severe cases may require a procedure to remove toxic wastes from the body (dialysis) or surgery to repair kidney damage. Surgery may involve:   Repair of a torn kidney.   Removal of an obstruction. Most of the time, you will need to stay overnight at the hospital.  HOME CARE INSTRUCTIONS:  Follow your prescribed diet.  Only take over-the-counter or prescription medicines as directed by your caregiver.  Do not take any new medicines (prescription, over-the-counter, or nutritional supplements) unless approved by your caregiver. Many medicines can worsen your kidney damage or need to have the dose adjusted.   Keep all follow-up appointments as directed by your caregiver.  Observe your condition to make sure you are healing as expected. SEEK IMMEDIATE MEDICAL CARE IF:  You are feeling ill or have severe pain in the back or side.  Your symptoms return or you have new symptoms.  You have any symptoms of end-stage kidney disease. These include:   Persistent itchiness.   Loss of appetite.   Headaches.   Abnormally dark or light skin.  Numbness in the hands or feet.   Easy bruising.   Frequent hiccups.   Menstruation stops.   You have a fever.  You have increased urine production.  You have pain or bleeding when urinating. MAKE SURE YOU:   Understand these instructions.  Will watch your condition.  Will get help right away if you are not doing well or get worse Document Released: 04/18/2011 Document Revised: 01/28/2013 Document Reviewed: 06/01/2012 [facility] Patient Information 2015 [place], [place]. This information is not intended to  replace advice given to you by your health care provider. Make sure you discuss any questions you have with your health care provider. Nausea and Vomiting Nausea is a sick feeling that often comes before throwing up (vomiting). Vomiting is a reflex where stomach contents come out of your mouth. Vomiting can cause severe loss of body fluids (dehydration). Children and elderly adults can become dehydrated quickly, especially if they also have diarrhea. Nausea and vomiting are symptoms of a condition or disease. It is important to find the cause of your symptoms. CAUSES   Direct irritation of the stomach lining. This irritation can result from increased acid production (gastroesophageal reflux disease), infection, food poisoning, taking certain medicines (such as nonsteroidal anti-inflammatory drugs), alcohol use, or tobacco use.  Signals from the brain.These signals could be caused by a headache, heat exposure, an inner ear disturbance, increased pressure in the brain from injury, infection, a tumor, or a concussion, pain, emotional stimulus, or metabolic problems.  An obstruction in the gastrointestinal tract (bowel obstruction).  Illnesses such as diabetes, hepatitis, gallbladder problems, appendicitis, kidney problems, cancer, sepsis, atypical symptoms of a heart attack, or eating disorders.  Medical treatments such as chemotherapy and radiation.  Receiving medicine that makes you sleep (general anesthetic) during surgery. DIAGNOSIS Your caregiver may ask for tests to be done if the problems do not improve after a few days. Tests may also be done if symptoms are severe or if the reason for the nausea and vomiting is not clear. Tests may include:  Urine tests.  Blood tests.  Stool tests.  Cultures (to look for evidence of infection).  X-rays or other imaging studies. Test results can help your caregiver make decisions about treatment or the need for additional tests. TREATMENT You need  to stay well hydrated. Drink frequently but in small amounts.You may wish to drink water, sports drinks, clear broth, or eat frozen ice pops or gelatin dessert to help stay hydrated.When you eat, eating slowly may help prevent nausea.There are also some antinausea medicines that may help prevent nausea. HOME CARE INSTRUCTIONS   Take all medicine as directed by your caregiver.  If you do not have an appetite, do not force yourself to eat. However, you must continue to drink fluids.  If you have an appetite, eat a normal diet unless your caregiver tells you differently.  Eat a variety of complex carbohydrates (rice, wheat, potatoes, bread), lean meats, yogurt, fruits, and vegetables.  Avoid high-fat foods because they are more difficult to digest.  Drink enough water and fluids to keep your urine clear or pale yellow.  If you are dehydrated, ask your caregiver for specific rehydration instructions. Signs of dehydration may include:  Severe thirst.  Dry lips and mouth.  Dizziness.  Dark  urine.  Decreasing urine frequency and amount.  Confusion.  Rapid breathing or pulse. SEEK IMMEDIATE MEDICAL CARE IF:   You have blood or brown flecks (like coffee grounds) in your vomit.  You have black or bloody stools.  You have a severe headache or stiff neck.  You are confused.  You have severe abdominal pain.  You have chest pain or trouble breathing.  You do not urinate at least once every 8 hours.  You develop cold or clammy skin.  You continue to vomit for longer than 24 to 48 hours.  You have a fever. MAKE SURE YOU:   Understand these instructions.  Will watch your condition.  Will get help right away if you are not doing well or get worse. Document Released: 10/03/2005 Document Revised: 12/26/2011 Document Reviewed: 03/02/2011 [facility] Patient Information 2015 [place], [place]. This information is not intended to replace advice given to you by your health care  provider. Make sure you discuss any questions you have with your health care provider.

## 2015-02-12 NOTE — ED Notes (Signed)
Pt made aware urine sample needed 

## 2015-02-12 NOTE — ED Notes (Signed)
Attempted to obtain blood from pt. Pt unable/will not allow at this time, will f/u

## 2015-02-12 NOTE — ED Provider Notes (Signed)
This patient's care was assumed from Doyce Loose, PA-C at shift change. Please see her note for further.  Patient with a history of chronic back pain presented to the emergency department from his pain clinic complaining of generalized abdominal pain associated with nausea, vomiting and diarrhea.  Patient had evidence of dehydration and acute kidney injury due to this dehydration on lab work. Patient given multiple fluid boluses in the emergency department. At time of shift change patient is awaiting on CT scan and urine drug screen. Plan is for discharge if CT scan is unremarkable. Plan is for no narcotics in ED or discharge narcotics. CT scan returned showing findings likely related to resolving gastroenteritis. No acute findings on CT scan. Urinalysis is negative for infection. Urine drug screen was positive for opiates. At my evaluation the patient reports feeling better. He has tolerated by mouth liquids in the emergency department prior to discharge. He denies any nausea currently. I advised patient to drink lots of fluids and follow-up with his primary care provider next week to have his creatinine checked. The patient reports he has appointment with his primary care provider and 5 days. I advised him to keep this appointment. He reports having a follow up appointment with his pain medicine doctor tomorrow. Strict return precautions provided. I advised the patient to follow-up with their primary care provider this week. I advised the patient to return to the emergency department with new or worsening symptoms or new concerns. The patient verbalized understanding and agreement with plan.   Results for orders placed or performed during the hospital encounter of 02/12/15  CBC with Differential  Result Value Ref Range   WBC 16.3 (H) 4.0 - 10.5 K/uL   RBC 6.32 (H) 4.22 - 5.81 MIL/uL   Hemoglobin 17.6 (H) 13.0 - 17.0 g/dL   HCT 49.6 39.0 - 52.0 %   MCV 78.5 78.0 - 100.0 fL   MCH 27.8 26.0 - 34.0 pg   MCHC 35.5 30.0 - 36.0 g/dL   RDW 14.1 11.5 - 15.5 %   Platelets 347 150 - 400 K/uL   Neutrophils Relative % 85 (H) 43 - 77 %   Neutro Abs 13.9 (H) 1.7 - 7.7 K/uL   Lymphocytes Relative 8 (L) 12 - 46 %   Lymphs Abs 1.4 0.7 - 4.0 K/uL   Monocytes Relative 7 3 - 12 %   Monocytes Absolute 1.1 (H) 0.1 - 1.0 K/uL   Eosinophils Relative 0 0 - 5 %   Eosinophils Absolute 0.0 0.0 - 0.7 K/uL   Basophils Relative 0 0 - 1 %   Basophils Absolute 0.0 0.0 - 0.1 K/uL  Comprehensive metabolic panel  Result Value Ref Range   Sodium 143 135 - 145 mmol/L   Potassium 5.0 3.5 - 5.1 mmol/L   Chloride 106 96 - 112 mmol/L   CO2 21 19 - 32 mmol/L   Glucose, Bld 121 (H) 70 - 99 mg/dL   BUN 27 (H) 6 - 23 mg/dL   Creatinine, Ser 1.71 (H) 0.50 - 1.35 mg/dL   Calcium 10.3 8.4 - 10.5 mg/dL   Total Protein 9.5 (H) 6.0 - 8.3 g/dL   Albumin 5.5 (H) 3.5 - 5.2 g/dL   AST 27 0 - 37 U/L   ALT 18 0 - 53 U/L   Alkaline Phosphatase 62 39 - 117 U/L   Total Bilirubin 1.1 0.3 - 1.2 mg/dL   GFR calc non Af Amer 51 (L) >90 mL/min   GFR calc Af  Amer 59 (L) >90 mL/min   Anion gap 16 (H) 5 - 15  Lipase, blood  Result Value Ref Range   Lipase 16 11 - 59 U/L  Ethanol  Result Value Ref Range   Alcohol, Ethyl (B) <5 0 - 9 mg/dL  Urinalysis, Routine w reflex microscopic  Result Value Ref Range   Color, Urine YELLOW YELLOW   APPearance CLEAR CLEAR   Specific Gravity, Urine >1.046 (H) 1.005 - 1.030   pH 5.5 5.0 - 8.0   Glucose, UA NEGATIVE NEGATIVE mg/dL   Hgb urine dipstick TRACE (A) NEGATIVE   Bilirubin Urine NEGATIVE NEGATIVE   Ketones, ur NEGATIVE NEGATIVE mg/dL   Protein, ur NEGATIVE NEGATIVE mg/dL   Urobilinogen, UA 0.2 0.0 - 1.0 mg/dL   Nitrite NEGATIVE NEGATIVE   Leukocytes, UA NEGATIVE NEGATIVE  Drug screen panel, emergency  Result Value Ref Range   Opiates POSITIVE (A) NONE DETECTED   Cocaine NONE DETECTED NONE DETECTED   Benzodiazepines NONE DETECTED NONE DETECTED   Amphetamines NONE DETECTED NONE DETECTED    Tetrahydrocannabinol NONE DETECTED NONE DETECTED   Barbiturates NONE DETECTED NONE DETECTED  CK  Result Value Ref Range   Total CK 606 (H) 7 - 232 U/L  Urine microscopic-add on  Result Value Ref Range   Squamous Epithelial / LPF RARE RARE   WBC, UA 0-2 <3 WBC/hpf   RBC / HPF 3-6 <3 RBC/hpf   Bacteria, UA RARE RARE   Ct Abdomen Pelvis W Contrast  02/12/2015   CLINICAL DATA:  Diffuse abdominal pain  EXAM: CT ABDOMEN AND PELVIS WITH CONTRAST  TECHNIQUE: Multidetector CT imaging of the abdomen and pelvis was performed using the standard protocol following bolus administration of intravenous contrast.  CONTRAST:  187mL OMNIPAQUE IOHEXOL 300 MG/ML  SOLN  COMPARISON:  CT abdomen pelvis 12/17/2013  FINDINGS: Lung bases are clear without infiltrate or effusion. Heart size normal  Liver gallbladder and bile ducts are normal. No liver lesion. Pancreas and spleen normal.  Mildly dilated small bowel loops diffusely with air-fluid levels and fluid filling the small bowel. The appearance has improved since the prior study. No significant bowel wall edema. Colon decompressed. No colonic wall edema identified.  Kidneys are normal. No renal mass or obstruction or stone. Urinary bladder empty without focal abnormality  Negative for free fluid.  Negative for mass or adenopathy.  Normal lumbar spine.  IMPRESSION: Distended fluid-filled loops of small bowel diffusely with improvement since 01/13/2014. Colon decompressed. Findings most likely related to resolving gastroenteritis. Correlate with symptoms. Resolution of small bowel obstruction also possible.  No other new findings.   Electronically Signed   By: Franchot Gallo M.D.   On: 02/12/2015 16:48      Waynetta Pean, PA-C 02/12/15 1914  Blanchie Dessert, MD 02/12/15 2325

## 2015-02-12 NOTE — ED Notes (Signed)
Nurse getting labs 

## 2015-02-12 NOTE — ED Notes (Addendum)
Pt will not answer questions asked regarding medical history. Pt is currently laying in bed. Not agreeable to VS

## 2015-02-12 NOTE — ED Notes (Signed)
Pt aware sample needed

## 2015-02-12 NOTE — ED Notes (Addendum)
Per EMS they were contacted by representative from the pain clinic. Pt thought he was scheduled for an appt for today.Pt was unable to walk steadily inside building. Pt was laying down on the couch when EMS arrived. He c/o N/V/D, starting yesterday while pt driving. He also c/o abdominal pain. Pt was seen in an ED in [place], diagnoses unknown. 4 mg zofran given in field

## 2015-02-17 ENCOUNTER — Ambulatory Visit (INDEPENDENT_AMBULATORY_CARE_PROVIDER_SITE_OTHER): Payer: BLUE CROSS/BLUE SHIELD | Admitting: Internal Medicine

## 2015-02-17 ENCOUNTER — Encounter: Payer: Self-pay | Admitting: Internal Medicine

## 2015-02-17 VITALS — BP 124/80 | HR 90 | Temp 99.2°F | Resp 18 | Ht 68.0 in | Wt 141.0 lb

## 2015-02-17 DIAGNOSIS — Z01810 Encounter for preprocedural cardiovascular examination: Secondary | ICD-10-CM | POA: Insufficient documentation

## 2015-02-17 DIAGNOSIS — G4700 Insomnia, unspecified: Secondary | ICD-10-CM

## 2015-02-17 DIAGNOSIS — M519 Unspecified thoracic, thoracolumbar and lumbosacral intervertebral disc disorder: Secondary | ICD-10-CM | POA: Insufficient documentation

## 2015-02-17 DIAGNOSIS — Z0001 Encounter for general adult medical examination with abnormal findings: Secondary | ICD-10-CM | POA: Insufficient documentation

## 2015-02-17 DIAGNOSIS — Z0000 Encounter for general adult medical examination without abnormal findings: Secondary | ICD-10-CM

## 2015-02-17 DIAGNOSIS — M5090 Cervical disc disorder, unspecified, unspecified cervical region: Secondary | ICD-10-CM | POA: Insufficient documentation

## 2015-02-17 DIAGNOSIS — Z0189 Encounter for other specified special examinations: Secondary | ICD-10-CM

## 2015-02-17 DIAGNOSIS — F419 Anxiety disorder, unspecified: Secondary | ICD-10-CM | POA: Insufficient documentation

## 2015-02-17 DIAGNOSIS — K219 Gastro-esophageal reflux disease without esophagitis: Secondary | ICD-10-CM | POA: Insufficient documentation

## 2015-02-17 MED ORDER — ESCITALOPRAM OXALATE 10 MG PO TABS: 10.0000 mg | ORAL_TABLET | Freq: Every day | ORAL | Status: DC

## 2015-02-17 MED ORDER — ZOLPIDEM TARTRATE ER 12.5 MG PO TBCR: 12.5000 mg | EXTENDED_RELEASE_TABLET | Freq: Every evening | ORAL | Status: DC | PRN

## 2015-02-17 NOTE — Patient Instructions (Signed)
Please take all new medication as prescribed - the longer acting ambien, and the lexapro 10 mg per day  Please continue all other medications as before, and refills have been done if requested.  Please have the pharmacy call with any other refills you may need.  Please continue your efforts at being more active, low cholesterol diet, and weight control.  You are otherwise up to date with prevention measures today.  Please keep your appointments with your specialists as you may have planned  Please go to the LAB in the Basement (turn left off the elevator) for the tests to be done a your convenience  You will be contacted by phone if any changes need to be made immediately.  Otherwise, you will receive a letter about your results with an explanation, but please check with MyChart first.  Please remember to sign up for MyChart if you have not done so, as this will be important to you in the future with finding out test results, communicating by private email, and scheduling acute appointments online when needed.  Please return in 1 year for your yearly visit, or sooner if needed, with Lab testing done 3-5 days before

## 2015-02-17 NOTE — Progress Notes (Signed)
Subjective:    Patient ID: Jason [name], male    DOB: [birth year], 34 y.o.   MRN: [id number]  HPI  Here for wellness and new pt;  Overall doing ok;  Pt denies Chest pain, worsening SOB, DOE, wheezing, orthopnea, PND, worsening LE edema, palpitations, dizziness or syncope.  Pt denies neurological change such as new headache, facial or extremity weakness.  Pt denies polydipsia, polyuria, or low sugar symptoms. Pt states overall good compliance with treatment and medications, good tolerability, and has been trying to follow appropriate diet.  Pt denies worsening depressive symptoms, suicidal ideation or panic. No fever, night sweats, wt loss, loss of appetite, or other constitutional symptoms.  Pt states good ability with ADL's, has low fall risk, home safety reviewed and adequate, no other significant changes in hearing or vision, and only occasionally active with exercise.  Sees pain clinic, has known lumbar disc dz and cervical disc dz, but pt putting off lumbar surgury for now. Still smoking 1 ppd, tried to quit, x 17 yrs.  C/o ongoing anxiety and insomnia,ambien 10 not working as well .latelly  also advised to f/u renal labs at last ED vitis. Past Medical History  Diagnosis Date  . Back injury   . Chronic back pain   . MIGRAINE, COMMON 09/23/2010    Qualifier: Diagnosis of  By: Jenny Reichmann MD, Hunt Oris    History reviewed. No pertinent past surgical history.  reports that he has been smoking.  He has never used smokeless tobacco. He reports that he drinks alcohol. He reports that he does not use illicit drugs. family history is not on file. No Known Allergies Current Outpatient Prescriptions on File Prior to Visit  Medication Sig Dispense Refill  . Aspirin-Acetaminophen-Caffeine (GOODY HEADACHE PO) Take 1 each by mouth daily as needed (headache).    . fentaNYL (DURAGESIC - DOSED MCG/HR) 25 MCG/HR Place 25 mcg onto the skin every 3 (three) days.     . ondansetron (ZOFRAN ODT) 4 MG disintegrating  tablet Take 1 tablet (4 mg total) by mouth every 8 (eight) hours as needed for nausea or vomiting. 10 tablet 0  . oxycodone (ROXICODONE) 30 MG immediate release tablet Take 30 mg by mouth 6 (six) times daily.     . polyvinyl alcohol (LIQUIFILM TEARS) 1.4 % ophthalmic solution Place 1 drop into both eyes daily as needed for dry eyes.    Marland Kitchen zolpidem (AMBIEN) 10 MG tablet Take 10 mg by mouth at bedtime.     No current facility-administered medications on file prior to visit.   Review of Systems Constitutional: Negative for increased diaphoresis, other activity, appetite or siginficant weight change other than noted HENT: Negative for worsening hearing loss, ear pain, facial swelling, mouth sores and neck stiffness.   Eyes: Negative for other worsening pain, redness or visual disturbance.  Respiratory: Negative for shortness of breath and wheezing  Cardiovascular: Negative for chest pain and palpitations.  Gastrointestinal: Negative for diarrhea, blood in stool, abdominal distention or other pain Genitourinary: Negative for hematuria, flank pain or change in urine volume.  Musculoskeletal: Negative for myalgias or other joint complaints.  Skin: Negative for color change and wound or drainage.  Neurological: Negative for syncope and numbness. other than noted Hematological: Negative for adenopathy. or other swelling Psychiatric/Behavioral: Negative for hallucinations, SI, self-injury, decreased concentration or other worsening agitation.      Objective:   Physical Exam BP 124/80 mmHg  Pulse 90  Temp(Src) 99.2 F (37.3 C) (Oral)  Resp 18  Ht 5\' 8"  (1.727 m)  Wt 141 lb 0.6 oz (63.975 kg)  BMI 21.45 kg/m2  SpO2 98% VS noted,  Constitutional: Pt is oriented to person, place, and time. Appears well-developed and well-nourished, in no significant distress Head: Normocephalic and atraumatic.  Right Ear: External ear normal.  Left Ear: External ear normal.  Nose: Nose normal.  Mouth/Throat:  Oropharynx is clear and moist.  Eyes: Conjunctivae and EOM are normal. Pupils are equal, round, and reactive to light.  Neck: Normal range of motion. Neck supple. No JVD present. No tracheal deviation present or significant neck LA or mass Cardiovascular: Normal rate, regular rhythm, normal heart sounds and intact distal pulses.   Pulmonary/Chest: Effort normal and breath sounds without rales or wheezing  Abdominal: Soft. Bowel sounds are normal. NT. No HSM  Musculoskeletal: Normal range of motion. Exhibits no edema.  Lymphadenopathy:  Has no cervical adenopathy.  Neurological: Pt is alert and oriented to person, place, and time. Pt has normal reflexes. No cranial nerve deficit. Motor grossly intact Skin: Skin is warm and dry. No rash noted.  Psychiatric:  Has nervous mood and affect. Behavior is normal.     Assessment & Plan:

## 2015-02-17 NOTE — Assessment & Plan Note (Signed)

## 2015-04-13 ENCOUNTER — Telehealth: Payer: Self-pay | Admitting: Internal Medicine

## 2015-04-13 NOTE — Telephone Encounter (Signed)
Patient Name: Jason [name] DOB: Nov 23, 1980 Initial Comment caller states son has pain and numbness in his right arm radiating to his neck, also has 3 bumps on his arm Nurse Assessment Nurse: Ronnald Ramp, RN, Miranda Date/Time (Eastern Time): 04/13/2015 11:11:44 AM Confirm and document reason for call. If symptomatic, describe symptoms. ---Spoke with pt, states he has been having pain in his left arm for 1.5 months. No known injury. Also has 3 bumps on his right arm that he noticed 2 weeks ago. Has the patient traveled out of the country within the last 30 days? ---No Does the patient require triage? ---Yes Related visit to physician within the last 2 weeks? ---No Does the PT have any chronic conditions? (i.e. diabetes, asthma, etc.) ---Yes List chronic conditions. ---Chronic back pain Guidelines Guideline Title Affirmed Question Affirmed Notes Shoulder Pain Numbness (i.e., loss of sensation) in hand or fingers Final Disposition User See Physician within 24 Hours Ronnald Ramp, RN, Miranda Comments Pt already has appt tomorrow at 5:00pm with Dr. Jenny Reichmann.

## 2015-04-14 ENCOUNTER — Ambulatory Visit: Payer: BLUE CROSS/BLUE SHIELD | Admitting: Internal Medicine

## 2015-05-04 ENCOUNTER — Other Ambulatory Visit: Payer: Self-pay | Admitting: Specialist

## 2015-05-04 DIAGNOSIS — M542 Cervicalgia: Secondary | ICD-10-CM

## 2015-05-10 ENCOUNTER — Other Ambulatory Visit: Payer: Self-pay | Admitting: Specialist

## 2015-05-10 ENCOUNTER — Ambulatory Visit
Admission: RE | Admit: 2015-05-10 | Discharge: 2015-05-10 | Disposition: A | Discharge status: Home / Self Care | Payer: BLUE CROSS/BLUE SHIELD | Source: Ambulatory Visit | Attending: Specialist | Admitting: Specialist

## 2015-05-10 ENCOUNTER — Other Ambulatory Visit: Payer: Self-pay

## 2015-05-10 DIAGNOSIS — M542 Cervicalgia: Secondary | ICD-10-CM

## 2015-08-05 ENCOUNTER — Ambulatory Visit (INDEPENDENT_AMBULATORY_CARE_PROVIDER_SITE_OTHER): Payer: Self-pay | Admitting: Internal Medicine

## 2015-08-05 ENCOUNTER — Encounter: Payer: Self-pay | Admitting: Internal Medicine

## 2015-08-05 VITALS — BP 110/68 | HR 73 | Temp 99.0°F | Wt 146.0 lb

## 2015-08-05 DIAGNOSIS — R05 Cough: Secondary | ICD-10-CM

## 2015-08-05 DIAGNOSIS — G4700 Insomnia, unspecified: Secondary | ICD-10-CM

## 2015-08-05 DIAGNOSIS — R059 Cough, unspecified: Secondary | ICD-10-CM

## 2015-08-05 DIAGNOSIS — F419 Anxiety disorder, unspecified: Secondary | ICD-10-CM

## 2015-08-05 MED ORDER — ALPRAZOLAM 0.25 MG PO TABS: 0.2500 mg | ORAL_TABLET | Freq: Three times a day (TID) | ORAL | Status: DC

## 2015-08-05 MED ORDER — AZITHROMYCIN 250 MG PO TABS: ORAL_TABLET | ORAL | Status: DC

## 2015-08-05 MED ORDER — ZOLPIDEM TARTRATE ER 12.5 MG PO TBCR: 12.5000 mg | EXTENDED_RELEASE_TABLET | Freq: Every evening | ORAL | Status: DC | PRN

## 2015-08-05 NOTE — Progress Notes (Signed)
Pre visit review using our clinic review tool, if applicable. No additional management support is needed unless otherwise documented below in the visit note. 

## 2015-08-05 NOTE — Patient Instructions (Signed)
Please take all new medication as prescribed - the antibiotic (sent to [facility])  Please continue all other medications as before, and refills have been done if requested - the xanax and ambien  Please have the pharmacy call with any other refills you may need.  Please continue your efforts at being more active, low cholesterol diet, and weight control.  Please keep your appointments with your specialists as you may have planned  Please stop smoking

## 2015-08-05 NOTE — Assessment & Plan Note (Signed)
stable overall by history and exam, recent data reviewed with pt, and pt to continue medical treatment as before,  to f/u any worsening symptoms or concerns, for med refill 

## 2015-08-05 NOTE — Assessment & Plan Note (Signed)
Mild to mod, c/w bronchitis, cant r/o atypical pna, for antibx course,  to f/u any worsening symptoms or concerns

## 2015-08-05 NOTE — Assessment & Plan Note (Signed)
stable overall by history and exam, recent data reviewed with pt, and pt to continue medical treatment as before,  to f/u any worsening symptoms or concerns Lab Results  Component Value Date   WBC 16.3* 02/12/2015   HGB 17.6* 02/12/2015   HCT 49.6 02/12/2015   PLT 347 02/12/2015   GLUCOSE 121* 02/12/2015   ALT 18 02/12/2015   AST 27 02/12/2015   NA 143 02/12/2015   K 5.0 02/12/2015   CL 106 02/12/2015   CREATININE 1.71* 02/12/2015   BUN 27* 02/12/2015   CO2 21 02/12/2015

## 2015-08-05 NOTE — Progress Notes (Signed)
Subjective:    Patient ID: Jason [name], male    DOB: 10/17/1981, 34 y.o.   MRN: [id number]  HPI  Here with acute onset mild to mod 2-3 days ST, HA, general weakness and malaise, with prod cough greenish sputum, but Pt denies chest pain, increased sob or doe, wheezing, orthopnea, PND, increased LE swelling, palpitations, dizziness or syncope. Still smoking, not ready to quit.  Still with significant issue with getting to sleep, asks for ambien refill. Denies worsening depressive symptoms, suicidal ideation, or panic; has ongoing anxiety, not increased recently, but asks for benzo refill Past Medical History  Diagnosis Date  . Back injury   . Chronic back pain   . MIGRAINE, COMMON 09/23/2010    Qualifier: Diagnosis of  By: Jenny Reichmann MD, Hunt Oris   . Lumbar disc disease   . Cervical disc disease   . GERD (gastroesophageal reflux disease)   . Anxiety   . Insomnia    No past surgical history on file.  reports that he has been smoking.  He has never used smokeless tobacco. He reports that he drinks alcohol. He reports that he does not use illicit drugs. family history includes Congestive Heart Failure in his father; Hyperlipidemia in his mother; Hypertension in his mother. No Known Allergies Current Outpatient Prescriptions on File Prior to Visit  Medication Sig Dispense Refill  . Aspirin-Acetaminophen-Caffeine (GOODY HEADACHE PO) Take 1 each by mouth daily as needed (headache).    . fentaNYL (DURAGESIC - DOSED MCG/HR) 25 MCG/HR Place 25 mcg onto the skin every 3 (three) days.     . ondansetron (ZOFRAN ODT) 4 MG disintegrating tablet Take 1 tablet (4 mg total) by mouth every 8 (eight) hours as needed for nausea or vomiting. 10 tablet 0  . oxycodone (ROXICODONE) 30 MG immediate release tablet Take 30 mg by mouth 6 (six) times daily.     . polyvinyl alcohol (LIQUIFILM TEARS) 1.4 % ophthalmic solution Place 1 drop into both eyes daily as needed for dry eyes.    Marland Kitchen zolpidem (AMBIEN CR) 12.5 MG CR  tablet Take 1 tablet (12.5 mg total) by mouth at bedtime as needed for sleep. 30 tablet 5  . escitalopram (LEXAPRO) 10 MG tablet Take 1 tablet (10 mg total) by mouth daily. 90 tablet 3   No current facility-administered medications on file prior to visit.   Review of Systems  Constitutional: Negative for unusual diaphoresis or night sweats HENT: Negative for ringing in ear or discharge Eyes: Negative for double vision or worsening visual disturbance.  Respiratory: Negative for choking and stridor.   Gastrointestinal: Negative for vomiting or other signifcant bowel change Genitourinary: Negative for hematuria or change in urine volume.  Musculoskeletal: Negative for other MSK pain or swelling Skin: Negative for color change and worsening wound.  Neurological: Negative for tremors and numbness other than noted  Psychiatric/Behavioral: Negative for decreased concentration or agitation other than above       Objective:   Physical Exam BP 110/68 mmHg  Pulse 73  Temp(Src) 99 F (37.2 C) (Oral)  Wt 146 lb (66.225 kg)  SpO2 95% VS noted, mild ill Constitutional: Pt appears in no significant distress HENT: Head: NCAT.  Right Ear: External ear normal.  Left Ear: External ear normal.  Eyes: . Pupils are equal, round, and reactive to light. Conjunctivae and EOM are normal Bilat tm's with mild erythema.  Max sinus areas mild tender.  Pharynx with mild erythema, no exudate Neck: Normal range of motion. Neck supple.  with bilat submandib LA tender Cardiovascular: Normal rate and regular rhythm.   Pulmonary/Chest: Effort normal and breath sounds without rales or wheezing.  Neurological: Pt is alert. Not confused , motor grossly intact Skin: Skin is warm. No rash, no LE edema Psychiatric: Pt behavior is normal. No agitation.     Assessment & Plan:

## 2015-11-16 ENCOUNTER — Ambulatory Visit (INDEPENDENT_AMBULATORY_CARE_PROVIDER_SITE_OTHER): Payer: No Typology Code available for payment source | Admitting: Internal Medicine

## 2015-11-16 ENCOUNTER — Ambulatory Visit (INDEPENDENT_AMBULATORY_CARE_PROVIDER_SITE_OTHER)
Admission: RE | Admit: 2015-11-16 | Discharge: 2015-11-16 | Disposition: A | Discharge status: Home / Self Care | Payer: No Typology Code available for payment source | Source: Ambulatory Visit | Attending: Internal Medicine | Admitting: Internal Medicine

## 2015-11-16 ENCOUNTER — Encounter: Payer: Self-pay | Admitting: Internal Medicine

## 2015-11-16 VITALS — BP 102/58 | HR 92 | Temp 99.6°F | Resp 16 | Ht 68.0 in | Wt 146.1 lb

## 2015-11-16 DIAGNOSIS — R05 Cough: Secondary | ICD-10-CM | POA: Diagnosis not present

## 2015-11-16 DIAGNOSIS — R059 Cough, unspecified: Secondary | ICD-10-CM

## 2015-11-16 MED ORDER — DOXYCYCLINE HYCLATE 100 MG PO TABS: 100.0000 mg | ORAL_TABLET | Freq: Two times a day (BID) | ORAL | Status: DC

## 2015-11-16 NOTE — Patient Instructions (Signed)
We have sent in an antibiotic called doxycycline. Take 1 pill twice a day for 1 week to clear the lungs.   We are also checking an x-ray today to see if you have pneumonia again.  Upper Respiratory Infection, Adult Most upper respiratory infections (URIs) are a viral infection of the air passages leading to the lungs. A URI affects the nose, throat, and upper air passages. The most common type of URI is nasopharyngitis and is typically referred to as "the common cold." URIs run their course and usually go away on their own. Most of the time, a URI does not require medical attention, but sometimes a bacterial infection in the upper airways can follow a viral infection. This is called a secondary infection. Sinus and middle ear infections are common types of secondary upper respiratory infections. Bacterial pneumonia can also complicate a URI. A URI can worsen asthma and chronic obstructive pulmonary disease (COPD). Sometimes, these complications can require emergency medical care and may be life threatening.  CAUSES Almost all URIs are caused by viruses. A virus is a type of germ and can spread from one person to another.  RISKS FACTORS You may be at risk for a URI if:   You smoke.   You have chronic heart or lung disease.  You have a weakened defense (immune) system.   You are very young or very old.   You have nasal allergies or asthma.  You work in crowded or poorly ventilated areas.  You work in health care facilities or schools. SIGNS AND SYMPTOMS  Symptoms typically develop 2-3 days after you come in contact with a cold virus. Most viral URIs last 7-10 days. However, viral URIs from the influenza virus (flu virus) can last 14-18 days and are typically more severe. Symptoms may include:   Runny or stuffy (congested) nose.   Sneezing.   Cough.   Sore throat.   Headache.   Fatigue.   Fever.   Loss of appetite.   Pain in your forehead, behind your eyes, and  over your cheekbones (sinus pain).  Muscle aches.  DIAGNOSIS  Your health care provider may diagnose a URI by:  Physical exam.  Tests to check that your symptoms are not due to another condition such as:  Strep throat.  Sinusitis.  Pneumonia.  Asthma. TREATMENT  A URI goes away on its own with time. It cannot be cured with medicines, but medicines may be prescribed or recommended to relieve symptoms. Medicines may help:  Reduce your fever.  Reduce your cough.  Relieve nasal congestion. HOME CARE INSTRUCTIONS   Take medicines only as directed by your health care provider.   Gargle warm saltwater or take cough drops to comfort your throat as directed by your health care provider.  Use a warm mist humidifier or inhale steam from a shower to increase air moisture. This may make it easier to breathe.  Drink enough fluid to keep your urine clear or pale yellow.   Eat soups and other clear broths and maintain good nutrition.   Rest as needed.   Return to work when your temperature has returned to normal or as your health care provider advises. You may need to stay home longer to avoid infecting others. You can also use a face mask and careful hand washing to prevent spread of the virus.  Increase the usage of your inhaler if you have asthma.   Do not use any tobacco products, including cigarettes, chewing tobacco, or electronic cigarettes.  If you need help quitting, ask your health care provider. PREVENTION  The best way to protect yourself from getting a cold is to practice good hygiene.   Avoid oral or hand contact with people with cold symptoms.   Wash your hands often if contact occurs.  There is no clear evidence that vitamin C, vitamin E, echinacea, or exercise reduces the chance of developing a cold. However, it is always recommended to get plenty of rest, exercise, and practice good nutrition.  SEEK MEDICAL CARE IF:   You are getting worse rather than  better.   Your symptoms are not controlled by medicine.   You have chills.  You have worsening shortness of breath.  You have brown or red mucus.  You have yellow or brown nasal discharge.  You have pain in your face, especially when you bend forward.  You have a fever.  You have swollen neck glands.  You have pain while swallowing.  You have white areas in the back of your throat. SEEK IMMEDIATE MEDICAL CARE IF:   You have severe or persistent:  Headache.  Ear pain.  Sinus pain.  Chest pain.  You have chronic lung disease and any of the following:  Wheezing.  Prolonged cough.  Coughing up blood.  A change in your usual mucus.  You have a stiff neck.  You have changes in your:  Vision.  Hearing.  Thinking.  Mood. MAKE SURE YOU:   Understand these instructions.  Will watch your condition.  Will get help right away if you are not doing well or get worse.   This information is not intended to replace advice given to you by your health care provider. Make sure you discuss any questions you have with your health care provider.   Document Released: 03/29/2001 Document Revised: 02/17/2015 Document Reviewed: 01/08/2014 Elsevier Interactive Patient Education [employer].

## 2015-11-16 NOTE — Progress Notes (Signed)
Pre visit review using our clinic review tool, if applicable. No additional management support is needed unless otherwise documented below in the visit note. 

## 2015-11-17 NOTE — Progress Notes (Signed)
   Subjective:    Patient ID: Jason [name], male    DOB: 1981-09-01, 35 y.o.   MRN: [id number]  HPI The patient is a 35 YO man coming in for cough and fevers. History of pneumonia. He has not tried anything over the counter. He is a smoker and continues to smoke. Some SOB at rest due to cough. Not limiting activities. Yellow sputum. Took tylenol this afternoon. No sinus drainage or nasal congestion.   Review of Systems  Constitutional: Positive for fever and chills. Negative for activity change, appetite change and unexpected weight change.  HENT: Negative for congestion, postnasal drip, rhinorrhea, sinus pressure, sore throat and trouble swallowing.   Eyes: Negative.   Respiratory: Positive for cough and shortness of breath. Negative for chest tightness and wheezing.   Cardiovascular: Negative for chest pain, palpitations and leg swelling.  Gastrointestinal: Negative.   Musculoskeletal: Negative.       Objective:   Physical Exam  Constitutional: He is oriented to person, place, and time. He appears well-developed and well-nourished.  HENT:  Head: Normocephalic and atraumatic.  Right Ear: External ear normal.  Left Ear: External ear normal.  Mild erythema of the oropharynx  Eyes: EOM are normal.  Neck: Normal range of motion.  Cardiovascular: Normal rate and regular rhythm.   Pulmonary/Chest: No respiratory distress. He has no wheezes.  Coarse rhonchi throughout  Abdominal: Soft. He exhibits no distension. There is no tenderness.  Neurological: He is alert and oriented to person, place, and time.  Skin: Skin is warm and dry.   Filed Vitals:   11/16/15 1627  BP: 102/58  Pulse: 92  Temp: 99.6 F (37.6 C)  TempSrc: Oral  Resp: 16  Height: 5\' 8"  (1.727 m)  Weight: 146 lb 1.3 oz (66.261 kg)  SpO2: 97%      Assessment & Plan:

## 2015-11-17 NOTE — Assessment & Plan Note (Signed)
Checking chest x-ray due to high fevers and not typical symptoms for flu. Rx for doxycycline. Advised to continue with tylenol for fevers. Considered febrile today as temp 99.6 after taking tylenol.

## 2015-11-19 ENCOUNTER — Other Ambulatory Visit: Payer: Self-pay | Admitting: Internal Medicine

## 2015-11-19 NOTE — Telephone Encounter (Signed)
Done hardcopy to Corinne  

## 2015-11-19 NOTE — Telephone Encounter (Signed)
Please advise 

## 2015-11-20 NOTE — Telephone Encounter (Signed)
Medication printed signed and faxed to pharmacy  

## 2015-12-20 ENCOUNTER — Encounter (HOSPITAL_COMMUNITY): Payer: Self-pay | Admitting: Emergency Medicine

## 2015-12-20 ENCOUNTER — Emergency Department (HOSPITAL_COMMUNITY): Payer: No Typology Code available for payment source

## 2015-12-20 ENCOUNTER — Observation Stay (HOSPITAL_COMMUNITY)
Admission: EM | Admit: 2015-12-20 | Discharge: 2015-12-22 | Disposition: A | Discharge status: Home / Self Care | Payer: No Typology Code available for payment source | Attending: Internal Medicine | Admitting: Internal Medicine

## 2015-12-20 DIAGNOSIS — A4189 Other specified sepsis: Principal | ICD-10-CM | POA: Insufficient documentation

## 2015-12-20 DIAGNOSIS — Z79891 Long term (current) use of opiate analgesic: Secondary | ICD-10-CM | POA: Diagnosis not present

## 2015-12-20 DIAGNOSIS — Z79899 Other long term (current) drug therapy: Secondary | ICD-10-CM | POA: Insufficient documentation

## 2015-12-20 DIAGNOSIS — A419 Sepsis, unspecified organism: Secondary | ICD-10-CM | POA: Diagnosis present

## 2015-12-20 DIAGNOSIS — E876 Hypokalemia: Secondary | ICD-10-CM | POA: Diagnosis present

## 2015-12-20 DIAGNOSIS — J09X2 Influenza due to identified novel influenza A virus with other respiratory manifestations: Secondary | ICD-10-CM | POA: Insufficient documentation

## 2015-12-20 DIAGNOSIS — F17210 Nicotine dependence, cigarettes, uncomplicated: Secondary | ICD-10-CM | POA: Diagnosis not present

## 2015-12-20 DIAGNOSIS — M519 Unspecified thoracic, thoracolumbar and lumbosacral intervertebral disc disorder: Secondary | ICD-10-CM | POA: Diagnosis not present

## 2015-12-20 DIAGNOSIS — M549 Dorsalgia, unspecified: Secondary | ICD-10-CM | POA: Insufficient documentation

## 2015-12-20 DIAGNOSIS — F419 Anxiety disorder, unspecified: Secondary | ICD-10-CM | POA: Insufficient documentation

## 2015-12-20 DIAGNOSIS — R6520 Severe sepsis without septic shock: Secondary | ICD-10-CM | POA: Diagnosis not present

## 2015-12-20 DIAGNOSIS — J101 Influenza due to other identified influenza virus with other respiratory manifestations: Secondary | ICD-10-CM | POA: Diagnosis present

## 2015-12-20 DIAGNOSIS — G8929 Other chronic pain: Secondary | ICD-10-CM | POA: Diagnosis not present

## 2015-12-20 DIAGNOSIS — Z7982 Long term (current) use of aspirin: Secondary | ICD-10-CM | POA: Insufficient documentation

## 2015-12-20 DIAGNOSIS — G4700 Insomnia, unspecified: Secondary | ICD-10-CM | POA: Diagnosis not present

## 2015-12-20 DIAGNOSIS — R509 Fever, unspecified: Secondary | ICD-10-CM | POA: Diagnosis present

## 2015-12-20 LAB — URINE MICROSCOPIC-ADD ON: WBC UA: NONE SEEN WBC/hpf (ref 0–5)

## 2015-12-20 LAB — CBC WITH DIFFERENTIAL/PLATELET
BASOS ABS: 0 10*3/uL (ref 0.0–0.1)
BASOS PCT: 0 %
Eosinophils Absolute: 0.1 10*3/uL (ref 0.0–0.7)
Eosinophils Relative: 1 %
HEMATOCRIT: 39.2 % (ref 39.0–52.0)
HEMOGLOBIN: 13.4 g/dL (ref 13.0–17.0)
Lymphocytes Relative: 7 %
Lymphs Abs: 0.5 10*3/uL — ABNORMAL LOW (ref 0.7–4.0)
MCH: 27.2 pg (ref 26.0–34.0)
MCHC: 34.2 g/dL (ref 30.0–36.0)
MCV: 79.5 fL (ref 78.0–100.0)
MONO ABS: 0.6 10*3/uL (ref 0.1–1.0)
Monocytes Relative: 7 %
NEUTROS ABS: 7 10*3/uL (ref 1.7–7.7)
NEUTROS PCT: 85 %
Platelets: 240 10*3/uL (ref 150–400)
RBC: 4.93 MIL/uL (ref 4.22–5.81)
RDW: 13.7 % (ref 11.5–15.5)
WBC: 8.2 10*3/uL (ref 4.0–10.5)

## 2015-12-20 LAB — INFLUENZA PANEL BY PCR (TYPE A & B)
H1N1 flu by pcr: NOT DETECTED
INFLAPCR: POSITIVE — AB
INFLBPCR: NEGATIVE

## 2015-12-20 LAB — URINALYSIS, ROUTINE W REFLEX MICROSCOPIC
Bilirubin Urine: NEGATIVE
Glucose, UA: NEGATIVE mg/dL
KETONES UR: NEGATIVE mg/dL
LEUKOCYTES UA: NEGATIVE
NITRITE: NEGATIVE
PH: 5.5 (ref 5.0–8.0)
Protein, ur: NEGATIVE mg/dL
Specific Gravity, Urine: 1.008 (ref 1.005–1.030)

## 2015-12-20 LAB — COMPREHENSIVE METABOLIC PANEL
ALT: 14 U/L — ABNORMAL LOW (ref 17–63)
ANION GAP: 12 (ref 5–15)
AST: 22 U/L (ref 15–41)
Albumin: 4 g/dL (ref 3.5–5.0)
Alkaline Phosphatase: 49 U/L (ref 38–126)
BILIRUBIN TOTAL: 0.8 mg/dL (ref 0.3–1.2)
CHLORIDE: 102 mmol/L (ref 101–111)
CO2: 25 mmol/L (ref 22–32)
Calcium: 9.2 mg/dL (ref 8.9–10.3)
Creatinine, Ser: 1.1 mg/dL (ref 0.61–1.24)
Glucose, Bld: 105 mg/dL — ABNORMAL HIGH (ref 65–99)
POTASSIUM: 3.2 mmol/L — AB (ref 3.5–5.1)
Sodium: 139 mmol/L (ref 135–145)
TOTAL PROTEIN: 6.9 g/dL (ref 6.5–8.1)

## 2015-12-20 LAB — I-STAT CG4 LACTIC ACID, ED: LACTIC ACID, VENOUS: 3.36 mmol/L — AB (ref 0.5–2.0)

## 2015-12-20 LAB — LIPASE, BLOOD: LIPASE: 20 U/L (ref 11–51)

## 2015-12-20 MED ORDER — IOHEXOL 300 MG/ML  SOLN
100.0000 mL | Freq: Once | INTRAMUSCULAR | Status: AC | PRN
  Administered 2015-12-20: 100 mL via INTRAVENOUS

## 2015-12-20 MED ORDER — ACETAMINOPHEN 325 MG PO TABS
ORAL_TABLET | ORAL | Status: AC
  Administered 2015-12-20: 650 mg via ORAL
  Filled 2015-12-20: qty 2

## 2015-12-20 MED ORDER — PIPERACILLIN-TAZOBACTAM 3.375 G IVPB 30 MIN
3.3750 g | Freq: Once | INTRAVENOUS | Status: AC
  Administered 2015-12-20: 3.375 g via INTRAVENOUS
  Filled 2015-12-20: qty 50

## 2015-12-20 MED ORDER — HYDROMORPHONE HCL 1 MG/ML IJ SOLN
1.0000 mg | Freq: Once | INTRAMUSCULAR | Status: AC
  Administered 2015-12-20: 1 mg via INTRAVENOUS
  Filled 2015-12-20: qty 1

## 2015-12-20 MED ORDER — KETOROLAC TROMETHAMINE 15 MG/ML IJ SOLN
15.0000 mg | Freq: Once | INTRAMUSCULAR | Status: AC
  Administered 2015-12-20: 15 mg via INTRAVENOUS
  Filled 2015-12-20: qty 1

## 2015-12-20 MED ORDER — OSELTAMIVIR PHOSPHATE 75 MG PO CAPS
75.0000 mg | ORAL_CAPSULE | Freq: Once | ORAL | Status: DC
  Filled 2015-12-20: qty 1

## 2015-12-20 MED ORDER — VANCOMYCIN HCL IN DEXTROSE 750-5 MG/150ML-% IV SOLN
750.0000 mg | Freq: Three times a day (TID) | INTRAVENOUS | Status: DC
  Filled 2015-12-20 (×2): qty 150

## 2015-12-20 MED ORDER — SODIUM CHLORIDE 0.9 % IV BOLUS (SEPSIS)
1000.0000 mL | INTRAVENOUS | Status: AC
  Administered 2015-12-20 (×2): 1000 mL via INTRAVENOUS

## 2015-12-20 MED ORDER — PIPERACILLIN-TAZOBACTAM 3.375 G IVPB
3.3750 g | Freq: Three times a day (TID) | INTRAVENOUS | Status: DC
Start: 2015-12-21 — End: 2015-12-21

## 2015-12-20 MED ORDER — SODIUM CHLORIDE 0.9 % IV BOLUS (SEPSIS)
1000.0000 mL | Freq: Once | INTRAVENOUS | Status: AC
  Administered 2015-12-20: 1000 mL via INTRAVENOUS

## 2015-12-20 MED ORDER — VANCOMYCIN HCL IN DEXTROSE 1-5 GM/200ML-% IV SOLN
1000.0000 mg | Freq: Once | INTRAVENOUS | Status: AC
  Administered 2015-12-20: 1000 mg via INTRAVENOUS
  Filled 2015-12-20: qty 200

## 2015-12-20 MED ORDER — ACETAMINOPHEN 325 MG PO TABS
650.0000 mg | ORAL_TABLET | Freq: Once | ORAL | Status: AC | PRN
  Administered 2015-12-20: 650 mg via ORAL

## 2015-12-20 NOTE — ED Notes (Signed)
EDP is aware of BP trending down, Gold[place] MD currently at bedside

## 2015-12-20 NOTE — ED Notes (Signed)
C/o lower back pain, bilateral leg pain, mid abd pain, non-productive cough, nausea, vomiting ("a little"), and diarrhea since last night.  Has not taken any meds prior to arrival.

## 2015-12-20 NOTE — ED Provider Notes (Signed)
CSN: [id number]     Arrival date & time 12/20/15  1925 History   First MD Initiated Contact with Patient 12/20/15 2102     Chief Complaint  Patient presents with  . Fever  . Back Pain  . Abdominal Pain  . Cough     (Consider location/radiation/quality/duration/timing/severity/associated sxs/prior Treatment) HPI  35 year old male presents with a chief complaint of cough and fever. Symptoms started yesterday shortly after visiting a family member in hospital. Cough is nonproductive. Also developed vomiting and diarrhea that lasted yesterday only. Having lower abdominal pain as well since yesterday. Back (low back) started hurting as well, also having pain in both legs. Has chronic back pain but says this is different. Feels like his legs are weak. No headaches. No chest pain. Rates his overall pain as severe. Most recently had a back injection 2 months ago. Denies IV drug abuse.  Past Medical History  Diagnosis Date  . Back injury   . Chronic back pain   . MIGRAINE, COMMON 09/23/2010    Qualifier: Diagnosis of  By: Jenny Reichmann MD, Hunt Oris   . Lumbar disc disease   . Cervical disc disease   . GERD (gastroesophageal reflux disease)   . Anxiety   . Insomnia    History reviewed. No pertinent past surgical history. Family History  Problem Relation Age of Onset  . Congestive Heart Failure Father   . Hypertension Mother   . Hyperlipidemia Mother    Social History  Substance Use Topics  . Smoking status: Current Every Day Smoker -- 1.00 packs/day  . Smokeless tobacco: Never Used  . Alcohol Use: Yes     Comment: occaisionally    Review of Systems  Constitutional: Positive for fever and chills.  Respiratory: Positive for cough.   Gastrointestinal: Positive for vomiting, abdominal pain and diarrhea.  Genitourinary: Negative for dysuria.  Musculoskeletal: Positive for back pain and arthralgias.  Neurological: Positive for weakness.  All other systems reviewed and are  negative.     Allergies  Review of patient's allergies indicates no known allergies.  Home Medications   Prior to Admission medications   Medication Sig Start Date End Date Taking? Authorizing Provider  ALPRAZolam Duanne Moron) 0.25 MG tablet TAKE 1 TABLET 3 TIMES A DAY. 11/19/15   Biagio Borg, MD  Aspirin-Acetaminophen-Caffeine (GOODY HEADACHE PO) Take 1 each by mouth daily as needed (headache).    Historical Provider, MD  doxycycline (VIBRA-TABS) 100 MG tablet Take 1 tablet (100 mg total) by mouth 2 (two) times daily. 11/16/15   Hoyt Koch, MD  escitalopram (LEXAPRO) 10 MG tablet Take 1 tablet (10 mg total) by mouth daily. 02/17/15 05/18/15  Biagio Borg, MD  fentaNYL (DURAGESIC - DOSED MCG/HR) 25 MCG/HR Place 25 mcg onto the skin every 3 (three) days. Reported on 11/16/2015    Historical Provider, MD  ondansetron (ZOFRAN ODT) 4 MG disintegrating tablet Take 1 tablet (4 mg total) by mouth every 8 (eight) hours as needed for nausea or vomiting. Patient not taking: Reported on 11/16/2015 02/12/15   Waynetta Pean, PA-C  oxycodone (ROXICODONE) 30 MG immediate release tablet Take 30 mg by mouth 6 (six) times daily.     Historical Provider, MD  polyvinyl alcohol (LIQUIFILM TEARS) 1.4 % ophthalmic solution Place 1 drop into both eyes daily as needed for dry eyes. Reported on 11/16/2015    Historical Provider, MD  zolpidem (AMBIEN CR) 12.5 MG CR tablet Take 1 tablet (12.5 mg total) by mouth at bedtime  as needed for sleep. 08/05/15   Biagio Borg, MD   BP 110/74 mmHg  Pulse 120  Temp(Src) 102.4 F (39.1 C) (Oral)  Resp 24  Ht 5\' 8"  (1.727 m)  Wt 136 lb (61.689 kg)  BMI 20.68 kg/m2  SpO2 100% Physical Exam  Constitutional: He is oriented to person, place, and time. He appears well-developed and well-nourished. He appears distressed.  HENT:  Head: Normocephalic and atraumatic.  Right Ear: External ear normal.  Left Ear: External ear normal.  Nose: Nose normal.  Eyes: Right eye exhibits no  discharge. Left eye exhibits no discharge.  Neck: Neck supple.  Cardiovascular: Regular rhythm, normal heart sounds and intact distal pulses.  Tachycardia present.   Pulmonary/Chest: Effort normal and breath sounds normal.  Shallow breaths limit auscultation but appears to have symmetric lung sounds  Abdominal: Soft. There is tenderness in the suprapubic area.  Musculoskeletal: He exhibits no edema.       Cervical back: He exhibits no tenderness.       Thoracic back: He exhibits bony tenderness (low thoracic).       Lumbar back: He exhibits bony tenderness.  Neurological: He is alert and oriented to person, place, and time.  5/5 strength in bilateral lower extremities but are somewhat weak (seems related to pain). Able to ambulate on his own  Skin: Skin is warm. He is diaphoretic.  Nursing note and vitals reviewed.   ED Course  Procedures (including critical care time) Labs Review Labs Reviewed  COMPREHENSIVE METABOLIC PANEL - Abnormal; Notable for the following:    Potassium 3.2 (*)    Glucose, Bld 105 (*)    BUN <5 (*)    ALT 14 (*)    All other components within normal limits  CBC WITH DIFFERENTIAL/PLATELET - Abnormal; Notable for the following:    Lymphs Abs 0.5 (*)    All other components within normal limits  URINALYSIS, ROUTINE W REFLEX MICROSCOPIC (NOT AT [facility]) - Abnormal; Notable for the following:    Hgb urine dipstick SMALL (*)    All other components within normal limits  INFLUENZA PANEL BY PCR (TYPE A & B, H1N1) - Abnormal; Notable for the following:    Influenza A By PCR POSITIVE (*)    All other components within normal limits  URINE MICROSCOPIC-ADD ON - Abnormal; Notable for the following:    Squamous Epithelial / LPF 0-5 (*)    Bacteria, UA RARE (*)    All other components within normal limits  I-STAT CG4 LACTIC ACID, ED - Abnormal; Notable for the following:    Lactic Acid, Venous 3.36 (*)    All other components within normal limits  CULTURE, BLOOD  (ROUTINE X 2)  CULTURE, BLOOD (ROUTINE X 2)  URINE CULTURE  LIPASE, BLOOD  I-STAT CG4 LACTIC ACID, ED    Imaging Review Dg Chest 2 View  12/20/2015  CLINICAL DATA:  C/o lower back pain, bilateral leg pain, mid abd pain, non-productive cough, SOB, nausea, vomiting fever and diarrhea since last night. Ps' temp 103 in ED Hx current smoker of 1 PPD. EXAM: CHEST  2 VIEW COMPARISON:  11/16/2015 FINDINGS: Normal heart size and pulmonary vascularity. Fine nodular perihilar infiltration in the lungs probably due to chronic bronchitis. Similar appearance to previous study. No focal airspace disease or consolidation to suggest acute infiltration. No blunting of costophrenic angles. No pneumothorax. Mediastinal contours appear intact. IMPRESSION: No active cardiopulmonary disease. Electronically Signed   By: Lucienne Capers M.D.   On:  12/20/2015 21:30   Ct Abdomen Pelvis W Contrast  12/20/2015  CLINICAL DATA:  Periumbilical abdominal pain since yesterday. Fever and cough today. EXAM: CT ABDOMEN AND PELVIS WITH CONTRAST TECHNIQUE: Multidetector CT imaging of the abdomen and pelvis was performed using the standard protocol following bolus administration of intravenous contrast. CONTRAST:  122mL OMNIPAQUE IOHEXOL 300 MG/ML  SOLN COMPARISON:  02/12/2015 FINDINGS: Lung bases are clear. Mild diffuse fatty infiltration of the liver. The gallbladder, pancreas, spleen, adrenal glands, kidneys, abdominal aorta, inferior vena cava, and retroperitoneal lymph nodes are unremarkable. Stomach, small bowel, and colon are not abnormally distended. No free air or free fluid in the abdomen. Abdominal wall musculature appears intact. Pelvis: Appendix is not identified. Prostate gland is not enlarged. No free or loculated pelvic fluid collections. No pelvic mass or lymphadenopathy. Bladder wall is not thickened. Rectosigmoid colon is decompressed. No destructive bone lesions. IMPRESSION: No acute process demonstrated in the abdomen or  pelvis. No evidence of bowel perforation or abscess. Appendix is not identified. Electronically Signed   By: Lucienne Capers M.D.   On: 12/20/2015 23:20   I have personally reviewed and evaluated these images and lab results as part of my medical decision-making.   EKG Interpretation None      MDM   Final diagnoses:  Influenza A    Patient appears ill on initial exam. After antipyretics, fluids, and pain control he does appear somewhat better. Initial lactate is concerning at 3.36. Treated per sepsis protocol. Given multiple possible sources, started broadly with vancomycin and Zosyn. However his symptoms are consistent with influenza which could explain all of his symptoms. He does test positive for flu a. He does have back pain and chronic back pain given his flu diagnosis I do not think MRI is warranted. He has some subjective weakness in his bilateral lower extremities on initial exam but after pain control he is moving his legs freely. I will defer MRI at this time. Given initial presentation with lactic acidosis he will be admitted to the hospital for observation and fluids. Dr. Myna Hidalgo to admit.    Sherwood Gambler, MD 12/21/15 [phone]

## 2015-12-20 NOTE — Progress Notes (Signed)
Pharmacy Antibiotic Note  Abhiram Goza is a 35 y.o. male admitted on 12/20/2015 with sepsis. Pharmacy has been consulted for vancomycin and zosyn dosing. Renal function wnl.  Goal: vancomycin trough 15-20  Plan: 1) Vancomycin 1g IV x 1 then 750mg  IV q8 2) Zosyn 3.375g IV q8 (4 hour infusion) 3) Follow renal function, cultures, LOT, level if needed  Height: 5\' 8"  (172.7 cm) Weight: 136 lb (61.689 kg) IBW/kg (Calculated) : 68.4  Temp (24hrs), Avg:102.7 F (39.3 C), Min:102.4 F (39.1 C), Max:103 F (39.4 C)   Recent Labs Lab 12/20/15 2025 12/20/15 2115  WBC 8.2  --   CREATININE 1.10  --   LATICACIDVEN  --  3.36*    Estimated Creatinine Clearance: 82.6 mL/min (by C-G formula based on Cr of 1.1).    No Known Allergies  Antimicrobials this admission: 3/5 Vancomcyin>> 3/5 Zosyn>>  Dose adjustments this admission: n/a  Microbiology results: n/a  Thank you for allowing pharmacy to be a part of this patient's care.  Deboraha Sprang 12/20/2015 9:43 PM

## 2015-12-21 ENCOUNTER — Encounter (HOSPITAL_COMMUNITY): Payer: Self-pay | Admitting: Family Medicine

## 2015-12-21 DIAGNOSIS — M519 Unspecified thoracic, thoracolumbar and lumbosacral intervertebral disc disorder: Secondary | ICD-10-CM

## 2015-12-21 DIAGNOSIS — J101 Influenza due to other identified influenza virus with other respiratory manifestations: Secondary | ICD-10-CM

## 2015-12-21 DIAGNOSIS — F419 Anxiety disorder, unspecified: Secondary | ICD-10-CM | POA: Diagnosis not present

## 2015-12-21 DIAGNOSIS — A419 Sepsis, unspecified organism: Secondary | ICD-10-CM | POA: Diagnosis present

## 2015-12-21 DIAGNOSIS — E876 Hypokalemia: Secondary | ICD-10-CM | POA: Diagnosis present

## 2015-12-21 DIAGNOSIS — G4700 Insomnia, unspecified: Secondary | ICD-10-CM

## 2015-12-21 LAB — BASIC METABOLIC PANEL
ANION GAP: 10 (ref 5–15)
BUN: <5 mg/dL — ABNORMAL LOW (ref 6–20)
CHLORIDE: 109 mmol/L (ref 101–111)
CO2: 23 mmol/L (ref 22–32)
Calcium: 7.7 mg/dL — ABNORMAL LOW (ref 8.9–10.3)
Creatinine, Ser: 0.91 mg/dL (ref 0.61–1.24)
GFR calc non Af Amer: >60 mL/min (ref >60)
Glucose, Bld: 121 mg/dL — ABNORMAL HIGH (ref 65–99)
Potassium: 3.4 mmol/L — ABNORMAL LOW (ref 3.5–5.1)
Sodium: 142 mmol/L (ref 135–145)

## 2015-12-21 LAB — APTT: aPTT: 31 seconds (ref 24–37)

## 2015-12-21 LAB — CBC WITH DIFFERENTIAL/PLATELET
Basophils Absolute: 0 10*3/uL (ref 0.0–0.1)
Basophils Relative: 0 %
Eosinophils Absolute: 0 10*3/uL (ref 0.0–0.7)
Eosinophils Relative: 1 %
HCT: 34.9 % — ABNORMAL LOW (ref 39.0–52.0)
HEMOGLOBIN: 11.5 g/dL — AB (ref 13.0–17.0)
LYMPHS ABS: 1 10*3/uL (ref 0.7–4.0)
LYMPHS PCT: 17 %
MCH: 26.3 pg (ref 26.0–34.0)
MCHC: 33 g/dL (ref 30.0–36.0)
MCV: 79.9 fL (ref 78.0–100.0)
Monocytes Absolute: 0.4 10*3/uL (ref 0.1–1.0)
Monocytes Relative: 7 %
NEUTROS ABS: 4.5 10*3/uL (ref 1.7–7.7)
NEUTROS PCT: 75 %
Platelets: 222 10*3/uL (ref 150–400)
RBC: 4.37 MIL/uL (ref 4.22–5.81)
RDW: 13.9 % (ref 11.5–15.5)
WBC: 6 10*3/uL (ref 4.0–10.5)

## 2015-12-21 LAB — MAGNESIUM: Magnesium: 1.2 mg/dL — ABNORMAL LOW (ref 1.7–2.4)

## 2015-12-21 LAB — PROTIME-INR
INR: 1.34 (ref 0.00–1.49)
Prothrombin Time: 16.7 seconds — ABNORMAL HIGH (ref 11.6–15.2)

## 2015-12-21 LAB — I-STAT CG4 LACTIC ACID, ED: Lactic Acid, Venous: 1.49 mmol/L (ref 0.5–2.0)

## 2015-12-21 LAB — PROCALCITONIN: Procalcitonin: <0.1 ng/mL

## 2015-12-21 LAB — LACTIC ACID, PLASMA
LACTIC ACID, VENOUS: 1.1 mmol/L (ref 0.5–2.0)
Lactic Acid, Venous: 1.8 mmol/L (ref 0.5–2.0)

## 2015-12-21 MED ORDER — ALPRAZOLAM 0.25 MG PO TABS
0.2500 mg | ORAL_TABLET | Freq: Four times a day (QID) | ORAL | Status: DC | PRN
  Administered 2015-12-21 – 2015-12-22 (×5): 0.25 mg via ORAL
  Filled 2015-12-21 (×5): qty 1

## 2015-12-21 MED ORDER — ACETAMINOPHEN 325 MG PO TABS
650.0000 mg | ORAL_TABLET | Freq: Four times a day (QID) | ORAL | Status: DC | PRN
  Administered 2015-12-21 (×3): 650 mg via ORAL
  Filled 2015-12-21 (×3): qty 2

## 2015-12-21 MED ORDER — POTASSIUM CHLORIDE CRYS ER 20 MEQ PO TBCR
20.0000 meq | EXTENDED_RELEASE_TABLET | Freq: Every day | ORAL | Status: DC
  Administered 2015-12-21: 20 meq via ORAL
  Filled 2015-12-21: qty 1

## 2015-12-21 MED ORDER — OXYCODONE HCL 5 MG PO TABS
30.0000 mg | ORAL_TABLET | Freq: Every day | ORAL | Status: DC
  Administered 2015-12-21 – 2015-12-22 (×8): 30 mg via ORAL
  Filled 2015-12-21 (×8): qty 6

## 2015-12-21 MED ORDER — KETOROLAC TROMETHAMINE 30 MG/ML IJ SOLN
30.0000 mg | Freq: Four times a day (QID) | INTRAMUSCULAR | Status: DC | PRN
  Administered 2015-12-21: 30 mg via INTRAVENOUS
  Filled 2015-12-21: qty 1

## 2015-12-21 MED ORDER — ONDANSETRON HCL 4 MG/2ML IJ SOLN: 4.0000 mg | Freq: Four times a day (QID) | INTRAMUSCULAR | Status: DC | PRN

## 2015-12-21 MED ORDER — ENOXAPARIN SODIUM 40 MG/0.4ML ~~LOC~~ SOLN
40.0000 mg | Freq: Every day | SUBCUTANEOUS | Status: DC
  Administered 2015-12-21 – 2015-12-22 (×2): 40 mg via SUBCUTANEOUS
  Filled 2015-12-21 (×2): qty 0.4

## 2015-12-21 MED ORDER — BISACODYL 5 MG PO TBEC: 5.0000 mg | DELAYED_RELEASE_TABLET | Freq: Every day | ORAL | Status: DC | PRN

## 2015-12-21 MED ORDER — SODIUM CHLORIDE 0.9% FLUSH
3.0000 mL | Freq: Two times a day (BID) | INTRAVENOUS | Status: DC
  Administered 2015-12-21 – 2015-12-22 (×3): 3 mL via INTRAVENOUS

## 2015-12-21 MED ORDER — POLYETHYLENE GLYCOL 3350 17 G PO PACK: 17.0000 g | PACK | Freq: Every day | ORAL | Status: DC | PRN

## 2015-12-21 MED ORDER — NICOTINE 21 MG/24HR TD PT24
21.0000 mg | MEDICATED_PATCH | Freq: Every day | TRANSDERMAL | Status: DC
  Administered 2015-12-21 – 2015-12-22 (×2): 21 mg via TRANSDERMAL
  Filled 2015-12-21 (×3): qty 1

## 2015-12-21 MED ORDER — ONDANSETRON HCL 4 MG PO TABS
4.0000 mg | ORAL_TABLET | Freq: Four times a day (QID) | ORAL | Status: DC | PRN
  Administered 2015-12-21: 4 mg via ORAL
  Filled 2015-12-21: qty 1

## 2015-12-21 MED ORDER — POTASSIUM CHLORIDE IN NACL 20-0.9 MEQ/L-% IV SOLN
INTRAVENOUS | Status: AC
  Administered 2015-12-21 (×2): via INTRAVENOUS
  Filled 2015-12-21 (×2): qty 1000

## 2015-12-21 MED ORDER — ZOLPIDEM TARTRATE 5 MG PO TABS
10.0000 mg | ORAL_TABLET | Freq: Every evening | ORAL | Status: DC | PRN
  Administered 2015-12-21: 10 mg via ORAL
  Filled 2015-12-21: qty 2

## 2015-12-21 MED ORDER — HYDROMORPHONE HCL 1 MG/ML IJ SOLN
1.0000 mg | INTRAMUSCULAR | Status: DC | PRN
  Administered 2015-12-21 (×2): 1 mg via INTRAVENOUS
  Filled 2015-12-21 (×2): qty 1

## 2015-12-21 MED ORDER — ALBUTEROL SULFATE (2.5 MG/3ML) 0.083% IN NEBU
2.5000 mg | INHALATION_SOLUTION | RESPIRATORY_TRACT | Status: DC | PRN
  Administered 2015-12-21 (×2): 2.5 mg via RESPIRATORY_TRACT
  Filled 2015-12-21 (×2): qty 3

## 2015-12-21 MED ORDER — ACETAMINOPHEN 650 MG RE SUPP: 650.0000 mg | Freq: Four times a day (QID) | RECTAL | Status: DC | PRN

## 2015-12-21 MED ORDER — OSELTAMIVIR PHOSPHATE 75 MG PO CAPS
75.0000 mg | ORAL_CAPSULE | Freq: Two times a day (BID) | ORAL | Status: DC
  Administered 2015-12-21 – 2015-12-22 (×4): 75 mg via ORAL
  Filled 2015-12-21 (×4): qty 1

## 2015-12-21 NOTE — Progress Notes (Signed)
[facility] PROGRESS NOTE  Jason [name] [id number] DOB: Feb 18, 1981 DOA: 12/20/2015 PCP: Cathlean Cower, MD   Assessment/Plan  Severe sepsis with hypotension due to Influenza A - Continue Tamiflu 75 mg BID  - lactic acidosis resolved, blood pressure improved - d/c IVF - check HIV   Acute on chronic back pain and abdominal pain - Acute exacerbation secondary to coughing fits associated with #1  - Continue home-dose oxycodone - d/c IV Dilaudid  - No 'red flag' features  -  CT abd/pelvis negative for acute process  3. Hypokalemia - Serum potassium 3.2 on arrival  - Supplementing in IVF  - Check mag level and replete prn  - Repeat chem panel in am   4. Anxiety - Stable  - Continue home-dose prn Ativan   5. Insomnia  - Taking Ambien CR at home  - Will continue with Ambien 10 mg qHS prn   Diet:  lovenox Access:  PIV IVF:  off Proph:  lovenox  Code Status: full Family Communication: patient alone Disposition Plan: pending blood pressure stable for 24 hours and tolerating PO (likely home Tuesday on Tamiflu)   Consultants:  none  Procedures:  CT ab/p   Antibiotics:  tamiflu 3/6 >   HPI/Subjective:  Still having fevers, chills, myalgias, lightheadedness when sitting up.  Not particularly SOB, but coughing.  Objective: Filed Vitals:   12/21/15 0137 12/21/15 0433 12/21/15 0527 12/21/15 0935  BP: 121/78 104/57  128/72  Pulse: 80 89  95  Temp: 98.9 F (37.2 C) 99.5 F (37.5 C)  100.4 F (38 C)  TempSrc: Oral Oral  Oral  Resp: 18 18  18   Height: 5\' 8"  (1.727 m)     Weight: 67 kg (147 lb 11.3 oz)     SpO2: 100% 95% 97% 99%    Intake/Output Summary (Last 24 hours) at 12/21/15 1449 Last data filed at 12/21/15 1300  Gross per 24 hour  Intake   4480 ml  Output      0 ml  Net   4480 ml   Filed Weights   12/20/15 1954 12/20/15 2146 12/21/15 0137  Weight: 61.689 kg (136 lb) 64.91 kg (143 lb 1.6 oz) 67 kg (147 lb 11.3 oz)   Body mass  index is 22.46 kg/(m^2).  Exam:   General:  Adult male, No acute distress  HEENT:  NCAT, MMM  Cardiovascular:  Mild tachycardic, RR, nl S1, S2 no mrg, 2+ pulses, warm extremities  Respiratory:  Scattered rales on exam, no rhonchi or wheeze, no increased WOB  Abdomen:   NABS, soft, NT/ND  MSK:   Normal tone and bulk, no LEE  Neuro:  Grossly intact  Data Reviewed: Basic Metabolic Panel:  Recent Labs Lab 12/20/15 2025 12/21/15 0428  NA 139 142  K 3.2* 3.4*  CL 102 109  CO2 25 23  GLUCOSE 105* 121*  BUN <5* <5*  CREATININE 1.10 0.91  CALCIUM 9.2 7.7*  MG  --  1.2*   Liver Function Tests:  Recent Labs Lab 12/20/15 2025  AST 22  ALT 14*  ALKPHOS 49  BILITOT 0.8  PROT 6.9  ALBUMIN 4.0    Recent Labs Lab 12/20/15 2025  LIPASE 20   No results for input(s): AMMONIA in the last 168 hours. CBC:  Recent Labs Lab 12/20/15 2025 12/21/15 0428  WBC 8.2 6.0  NEUTROABS 7.0 4.5  HGB 13.4 11.5*  HCT 39.2 34.9*  MCV 79.5 79.9  PLT 240 222    Recent Results (from  the past 240 hour(s))  Culture, blood (routine x 2)     Status: None (Preliminary result)   Collection Time: 12/20/15  8:15 PM  Result Value Ref Range Status   Specimen Description BLOOD RIGHT ANTECUBITAL  Final   Special Requests BOTTLES DRAWN AEROBIC AND ANAEROBIC 5CC EA  Final   Culture NO GROWTH < 24 HOURS  Final   Report Status PENDING  Incomplete  Culture, blood (routine x 2)     Status: None (Preliminary result)   Collection Time: 12/20/15  8:20 PM  Result Value Ref Range Status   Specimen Description BLOOD LEFT ANTECUBITAL  Final   Special Requests BOTTLES DRAWN AEROBIC AND ANAEROBIC 5CC EA  Final   Culture NO GROWTH < 24 HOURS  Final   Report Status PENDING  Incomplete  Urine culture     Status: None (Preliminary result)   Collection Time: 12/20/15 10:01 PM  Result Value Ref Range Status   Specimen Description URINE, CLEAN CATCH  Final   Special Requests NONE  Final   Culture NO  GROWTH < 24 HOURS  Final   Report Status PENDING  Incomplete     Studies: Dg Chest 2 View  12/20/2015  CLINICAL DATA:  C/o lower back pain, bilateral leg pain, mid abd pain, non-productive cough, SOB, nausea, vomiting fever and diarrhea since last night. Ps' temp 103 in ED Hx current smoker of 1 PPD. EXAM: CHEST  2 VIEW COMPARISON:  11/16/2015 FINDINGS: Normal heart size and pulmonary vascularity. Fine nodular perihilar infiltration in the lungs probably due to chronic bronchitis. Similar appearance to previous study. No focal airspace disease or consolidation to suggest acute infiltration. No blunting of costophrenic angles. No pneumothorax. Mediastinal contours appear intact. IMPRESSION: No active cardiopulmonary disease. Electronically Signed   By: Lucienne Capers M.D.   On: 12/20/2015 21:30   Ct Abdomen Pelvis W Contrast  12/20/2015  CLINICAL DATA:  Periumbilical abdominal pain since yesterday. Fever and cough today. EXAM: CT ABDOMEN AND PELVIS WITH CONTRAST TECHNIQUE: Multidetector CT imaging of the abdomen and pelvis was performed using the standard protocol following bolus administration of intravenous contrast. CONTRAST:  167mL OMNIPAQUE IOHEXOL 300 MG/ML  SOLN COMPARISON:  02/12/2015 FINDINGS: Lung bases are clear. Mild diffuse fatty infiltration of the liver. The gallbladder, pancreas, spleen, adrenal glands, kidneys, abdominal aorta, inferior vena cava, and retroperitoneal lymph nodes are unremarkable. Stomach, small bowel, and colon are not abnormally distended. No free air or free fluid in the abdomen. Abdominal wall musculature appears intact. Pelvis: Appendix is not identified. Prostate gland is not enlarged. No free or loculated pelvic fluid collections. No pelvic mass or lymphadenopathy. Bladder wall is not thickened. Rectosigmoid colon is decompressed. No destructive bone lesions. IMPRESSION: No acute process demonstrated in the abdomen or pelvis. No evidence of bowel perforation or  abscess. Appendix is not identified. Electronically Signed   By: Lucienne Capers M.D.   On: 12/20/2015 23:20    Scheduled Meds: . enoxaparin (LOVENOX) injection  40 mg Subcutaneous Daily  . nicotine  21 mg Transdermal Daily  . oseltamivir  75 mg Oral BID  . oxycodone  30 mg Oral 6 X Daily  . sodium chloride flush  3 mL Intravenous Q12H   Continuous Infusions:   Principal Problem:   Sepsis, unspecified organism [facility]) Active Problems:   Lumbar disc disease   Anxiety   Insomnia   Influenza A   Hypokalemia   Sepsis ([place])    Time spent: 30 min    Hjalmar Ballengee,  [facility] Pager [phone]. If 7PM-7AM, please contact night-coverage at [email or web address], password [facility] 12/21/2015, 2:49 PM

## 2015-12-21 NOTE — H&P (Signed)
[facility] History and Physical  Aseel Laws [id number] DOB: [birth year] DOA: 12/20/2015  Referring physician: ED physician PCP: Cathlean Cower, MD  Specialists:  None listed   Chief Complaint:  Cough, malaise, SOB  HPI: Jason [name] is a 35 y.o. male with PMH of remote back injury with lumbar and cervical disc disease, chronic migraine, anxiety, and insomnia who presents to the ED with 1 day of subjective fever, cough, malaise, and dyspnea. Patient describes himself as generally quite healthy, though he suffers from severe chronic back pain. He was in his usual state until approximately 24 hours prior to his presentation when he developed general malaise with subjective fevers and cough. His mother also developed very similar symptoms around the same time. Symptoms continued to progress, eventually with dyspnea at rest, prompting his presentation to the ED. Patient denies any recent long distance travel, chest pain, palpitations, or unilateral leg swelling or tenderness. He describes similar symptoms in the past when he had pneumonia. He received his seasonal influenza vaccine this year. His cough has been nonproductive and not associated with chest pain. There is no nausea, vomiting, diarrhea, dysuria, or hematuria. Patient also notes acute exacerbation and his chronic back pain that he attributes to coughing fits. He describes the pain as constant, "10/10," and localized 2 distinct areas, one in the lumbar region, and one in the cervical region. He states that the character of pain is the same as his typical symptoms, but the intensity is much worse than usual. He denies incontinence, leg weakness or numbness, saddle anesthesia, or unintentional weight loss.   In ED, patient was found to have temperature 37.5 C, saturating well on room air, with sinus tachycardia in the 110s, and blood pressure in the 80/50 range. Chest x-ray was obtained and negative for acute cardiopulmonary disease.  CBC was obtained and normal. CMP returns notable for mild hypokalemia at 3.2. Urine is grossly negative for infection initial lactic acid is elevated to a value of 3.36. Patient was bolused with 3 L of normal saline in the ED, blood and urine cultures were obtained, and empiric vancomycin, Zosyn, and Tamiflu were administered. Pain was managed with a dose of Toradol and Dilaudid. Seasonal flu PCR returned positive for influenza type A. Patient's heart rate and blood pressure improved with IV fluids, he remained hemodynamically stable, and will be admitted to the telemetry unit for ongoing evaluation and management of sepsis suspected secondary to a viral illness, namely influenza A.  Where does patient live?   At home    Can patient participate in ADLs?  Yes       Review of Systems:   General: no weight change or poor appetite. Fever, chills, sweats HEENT: no blurry vision, hearing changes or sore throat Pulm: no wheeze. Dyspnea and non-productive cough.  CV: no chest pain or palpitations Abd: no nausea, vomiting, abdominal pain, diarrhea, or constipation GU: no dysuria, hematuria, increased urinary frequency, or urgency  Ext: no leg edema Neuro: no focal weakness, numbness, or tingling, no vision change or hearing loss Skin: no rash, no wounds MSK: No muscle spasm, no deformity, no red, hot, or swollen joint. Acute on chronic back pain.  Heme: No easy bruising or bleeding Travel history: No recent long distant travel    Allergy: No Known Allergies  Past Medical History  Diagnosis Date  . Back injury   . Chronic back pain   . MIGRAINE, COMMON 09/23/2010    Qualifier: Diagnosis of  By: Jenny Reichmann MD, Hunt Oris   .  Lumbar disc disease   . Cervical disc disease   . GERD (gastroesophageal reflux disease)   . Anxiety   . Insomnia     History reviewed. No pertinent past surgical history.  Social History:  reports that he has been smoking.  He has never used smokeless tobacco. He reports that he  drinks alcohol. He reports that he does not use illicit drugs.  Family History:  Family History  Problem Relation Age of Onset  . Congestive Heart Failure Father   . Hypertension Mother   . Hyperlipidemia Mother      Prior to Admission medications   Medication Sig Start Date End Date Taking? Authorizing Provider  ALPRAZolam (XANAX) 0.25 MG tablet TAKE 1 TABLET 3 TIMES A DAY. Patient taking differently: TAKE 1 TABLET BY MOUTH 4 TIMES DAILY 11/19/15  Yes Biagio Borg, MD  Aspirin-Acetaminophen-Caffeine (GOODY HEADACHE PO) Take 2 packets by mouth daily as needed (headache).    Yes Historical Provider, MD  oxycodone (ROXICODONE) 30 MG immediate release tablet Take 30 mg by mouth 6 (six) times daily.    Yes Historical Provider, MD  zolpidem (AMBIEN CR) 12.5 MG CR tablet Take 1 tablet (12.5 mg total) by mouth at bedtime as needed for sleep. 08/05/15  Yes Biagio Borg, MD  escitalopram (LEXAPRO) 10 MG tablet Take 1 tablet (10 mg total) by mouth daily. 02/17/15 05/18/15  Biagio Borg, MD  ondansetron (ZOFRAN ODT) 4 MG disintegrating tablet Take 1 tablet (4 mg total) by mouth every 8 (eight) hours as needed for nausea or vomiting. Patient not taking: Reported on 11/16/2015 02/12/15   Waynetta Pean, PA-C    Physical Exam: Filed Vitals:   12/21/15 0015 12/21/15 0030 12/21/15 0100 12/21/15 0115  BP: 114/75 102/67 105/67 102/69  Pulse: 92 93 102 80  Temp:      TempSrc:      Resp: 20 18 12 16   Height:      Weight:      SpO2: 100% 97% 97% 92%   General:  Appears to be in acute respiratory distress with diaphoresis and tachypnea HEENT:       Eyes: PERRL, EOMI, no scleral icterus or conjunctival pallor.       ENT: No discharge from the ears or nose, no pharyngeal ulcers, petechiae or exudate, no tonsillar enlargement.        Neck: No JVD, no bruit, no appreciable mass Heme: No cervical adenopathy, no pallor Cardiac: S1/S2, RRR, hyperdynamic precordium. No murmurs, No gallops or rubs. Pulm: Good air  movement bilaterally. No rales, wheezing, rhonchi or rubs. Abd: Soft, nondistended, nontender, no rebound pain or gaurding, no mass or organomegaly, BS present. Ext: No LE edema bilaterally. 2+DP/PT pulse bilaterally. Musculoskeletal: No gross deformity, no red, hot, swollen joints   Skin: No rashes or wounds on exposed surfaces  Neuro: Alert, oriented X3, cranial nerves II-XII grossly intact. No focal findings Psych: Patient is not overtly psychotic, denies suicidal or homocidal ideation, no active hallucinations.  Labs on Admission:  Basic Metabolic Panel:  Recent Labs Lab 12/20/15 2025  NA 139  K 3.2*  CL 102  CO2 25  GLUCOSE 105*  BUN <5*  CREATININE 1.10  CALCIUM 9.2   Liver Function Tests:  Recent Labs Lab 12/20/15 2025  AST 22  ALT 14*  ALKPHOS 49  BILITOT 0.8  PROT 6.9  ALBUMIN 4.0    Recent Labs Lab 12/20/15 2025  LIPASE 20   No results for input(s): AMMONIA in the  last 168 hours. CBC:  Recent Labs Lab 12/20/15 2025  WBC 8.2  NEUTROABS 7.0  HGB 13.4  HCT 39.2  MCV 79.5  PLT 240   Cardiac Enzymes: No results for input(s): CKTOTAL, CKMB, CKMBINDEX, TROPONINI in the last 168 hours.  BNP (last 3 results) No results for input(s): BNP in the last 8760 hours.  ProBNP (last 3 results) No results for input(s): PROBNP in the last 8760 hours.  CBG: No results for input(s): GLUCAP in the last 168 hours.  Radiological Exams on Admission: Dg Chest 2 View  12/20/2015  CLINICAL DATA:  C/o lower back pain, bilateral leg pain, mid abd pain, non-productive cough, SOB, nausea, vomiting fever and diarrhea since last night. Ps' temp 103 in ED Hx current smoker of 1 PPD. EXAM: CHEST  2 VIEW COMPARISON:  11/16/2015 FINDINGS: Normal heart size and pulmonary vascularity. Fine nodular perihilar infiltration in the lungs probably due to chronic bronchitis. Similar appearance to previous study. No focal airspace disease or consolidation to suggest acute infiltration. No  blunting of costophrenic angles. No pneumothorax. Mediastinal contours appear intact. IMPRESSION: No active cardiopulmonary disease. Electronically Signed   By: Lucienne Capers M.D.   On: 12/20/2015 21:30   Ct Abdomen Pelvis W Contrast  12/20/2015  CLINICAL DATA:  Periumbilical abdominal pain since yesterday. Fever and cough today. EXAM: CT ABDOMEN AND PELVIS WITH CONTRAST TECHNIQUE: Multidetector CT imaging of the abdomen and pelvis was performed using the standard protocol following bolus administration of intravenous contrast. CONTRAST:  175mL OMNIPAQUE IOHEXOL 300 MG/ML  SOLN COMPARISON:  02/12/2015 FINDINGS: Lung bases are clear. Mild diffuse fatty infiltration of the liver. The gallbladder, pancreas, spleen, adrenal glands, kidneys, abdominal aorta, inferior vena cava, and retroperitoneal lymph nodes are unremarkable. Stomach, small bowel, and colon are not abnormally distended. No free air or free fluid in the abdomen. Abdominal wall musculature appears intact. Pelvis: Appendix is not identified. Prostate gland is not enlarged. No free or loculated pelvic fluid collections. No pelvic mass or lymphadenopathy. Bladder wall is not thickened. Rectosigmoid colon is decompressed. No destructive bone lesions. IMPRESSION: No acute process demonstrated in the abdomen or pelvis. No evidence of bowel perforation or abscess. Appendix is not identified. Electronically Signed   By: Lucienne Capers M.D.   On: 12/20/2015 23:20    EKG:   Not done in ED, will obtain as appropriate   Assessment/Plan  1. Seasonal influenza  - Pt presents quite ill, meeting sepsis criteria, likely d/t fulminant influenza  - Nasal swab PCR positive for flu A  - Tamiflu 75 mg BID  - Fluid resuscitation with 3 L NS in ED given initial lactate of 3.36 - Continue IVF with NS+K at 125 cc/hr overnight  - Lactate trending down to 1-range  - CXR neg for infiltrate, procalcitonin low, no suggestion of superimposed bacterial infection, abx  stopped  - Monitor on telemetry overnight, treat any fever with APAP, supportive measures    2. Acute on chronic back pain  - Acute exacerbation secondary to coughing fits associated with #1  - Continue home-dose oxycodone - Okay for short-course IV Dilaudid given acute exacerbation  - No 'red flag' features   3. Hypokalemia - Serum potassium 3.2 on arrival  - Supplementing in IVF  - Check mag level and replete prn  - Repeat chem panel in am   4. Anxiety - Stable  - Continue home-dose prn Ativan   5. Insomnia  - Taking Ambien CR at home  - Will continue with  Ambien 10 mg qHS prn     DVT ppx:   SQ Lovenox     Code Status: Full code Family Communication:  Yes, patient's mother at bed side Disposition Plan: Admit to inpatient   Date of Service 12/21/2015    Vianne Bulls, MD [facility] Pager [phone]  If 7PM-7AM, please contact night-coverage [email or web address] Password [email or web address] 12/21/2015, 1:32 AM

## 2015-12-22 DIAGNOSIS — A419 Sepsis, unspecified organism: Secondary | ICD-10-CM

## 2015-12-22 LAB — URINE CULTURE: Culture: NO GROWTH

## 2015-12-22 LAB — HIV ANTIBODY (ROUTINE TESTING W REFLEX): HIV SCREEN 4TH GENERATION: NONREACTIVE

## 2015-12-22 MED ORDER — POTASSIUM CHLORIDE CRYS ER 20 MEQ PO TBCR
40.0000 meq | EXTENDED_RELEASE_TABLET | Freq: Once | ORAL | Status: AC
  Administered 2015-12-22: 40 meq via ORAL
  Filled 2015-12-22: qty 2

## 2015-12-22 MED ORDER — MAGNESIUM SULFATE 2 GM/50ML IV SOLN
2.0000 g | Freq: Once | INTRAVENOUS | Status: AC
  Administered 2015-12-22: 2 g via INTRAVENOUS
  Filled 2015-12-22: qty 50

## 2015-12-22 MED ORDER — POTASSIUM CHLORIDE CRYS ER 20 MEQ PO TBCR: 20.0000 meq | EXTENDED_RELEASE_TABLET | Freq: Every day | ORAL | Status: DC

## 2015-12-22 MED ORDER — OSELTAMIVIR PHOSPHATE 75 MG PO CAPS: 75.0000 mg | ORAL_CAPSULE | Freq: Two times a day (BID) | ORAL | Status: DC

## 2015-12-22 NOTE — Discharge Instructions (Signed)
Follow with Primary MD Cathlean Cower, MD in 2-3 days   Get CBC, CMP, Magnesium, 2 view Chest X ray checked  by Primary MD next visit.    Activity: As tolerated with Full fall precautions use walker/cane & assistance as needed   Disposition Home     Diet:   Heart Healthy    For Heart failure patients - Check your Weight same time everyday, if you gain over 2 pounds, or you develop in leg swelling, experience more shortness of breath or chest pain, call your Primary MD immediately. Follow Cardiac Low Salt Diet and 1.5 lit/day fluid restriction.   On your next visit with your primary care physician please Get Medicines reviewed and adjusted.   Please request your Prim.MD to go over all Hospital Tests and Procedure/Radiological results at the follow up, please get all Hospital records sent to your Prim MD by signing hospital release before you go home.   If you experience worsening of your admission symptoms, develop shortness of breath, life threatening emergency, suicidal or homicidal thoughts you must seek medical attention immediately by calling 911 or calling your MD immediately  if symptoms less severe.  You Must read complete instructions/literature along with all the possible adverse reactions/side effects for all the Medicines you take and that have been prescribed to you. Take any new Medicines after you have completely understood and accpet all the possible adverse reactions/side effects.   Do not drive, operating heavy machinery, perform activities at heights, swimming or participation in water activities or provide baby sitting services if your were admitted for syncope or siezures until you have seen by Primary MD or a Neurologist and advised to do so again.  Do not drive when taking Pain medications.    Do not take more than prescribed Pain, Sleep and Anxiety Medications  Special Instructions: If you have smoked or chewed Tobacco  in the last 2 yrs please stop smoking, stop  any regular Alcohol  and or any Recreational drug use.  Wear Seat belts while driving.   Please note  You were cared for by a hospitalist during your hospital stay. If you have any questions about your discharge medications or the care you received while you were in the hospital after you are discharged, you can call the unit and asked to speak with the hospitalist on call if the hospitalist that took care of you is not available. Once you are discharged, your primary care physician will handle any further medical issues. Please note that NO REFILLS for any discharge medications will be authorized once you are discharged, as it is imperative that you return to your primary care physician (or establish a relationship with a primary care physician if you do not have one) for your aftercare needs so that they can reassess your need for medications and monitor your lab values.

## 2015-12-22 NOTE — Discharge Summary (Signed)
Jason [name], is a 35 y.o. male  DOB 11/19/1980  MRN [id number].  Admission date:  12/20/2015  Admitting Physician  Vianne Bulls, MD  Discharge Date:  12/22/2015   Primary MD  Cathlean Cower, MD  Recommendations for primary care physician for things to follow:   Check CBC, BMP, magnesium and a 2 view chest x-ray in 3-4 days. Pending HIV serology please follow.   Admission Diagnosis  Influenza A [J10.1]   Discharge Diagnosis  Influenza A [J10.1]    Principal Problem:   Sepsis, unspecified organism [facility]) Active Problems:   Lumbar disc disease   Anxiety   Insomnia   Influenza A   Hypokalemia   Sepsis ([place])      Past Medical History  Diagnosis Date  . Back injury   . Chronic back pain   . MIGRAINE, COMMON 09/23/2010    Qualifier: Diagnosis of  By: Jenny Reichmann MD, Hunt Oris   . Lumbar disc disease   . Cervical disc disease   . GERD (gastroesophageal reflux disease)   . Anxiety   . Insomnia     History reviewed. No pertinent past surgical history.     HPI  from the history and physical done on the day of admission:   Jason [name] is a 35 y.o. male with PMH of remote back injury with lumbar and cervical disc disease, chronic migraine, anxiety, and insomnia who presents to the ED with 1 day of subjective fever, cough, malaise, and dyspnea. Patient describes himself as generally quite healthy, though he suffers from severe chronic back pain. He was in his usual state until approximately 24 hours prior to his presentation when he developed general malaise with subjective fevers and cough. His mother also developed very similar symptoms around the same time. Symptoms continued to progress, eventually with dyspnea at rest, prompting his presentation to the ED. Patient denies any recent long distance travel, chest  pain, palpitations, or unilateral leg swelling or tenderness. He describes similar symptoms in the past when he had pneumonia. He received his seasonal influenza vaccine this year. His cough has been nonproductive and not associated with chest pain. There is no nausea, vomiting, diarrhea, dysuria, or hematuria. Patient also notes acute exacerbation and his chronic back pain that he attributes to coughing fits. He describes the pain as constant, "10/10," and localized 2 distinct areas, one in the lumbar region, and one in the cervical region. He states that the character of pain is the same as his typical symptoms, but the intensity is much worse than usual. He denies incontinence, leg weakness or numbness, saddle anesthesia, or unintentional weight loss.   In ED, patient was found to have temperature 37.5 C, saturating well on room air, with sinus tachycardia in the 110s, and blood pressure in the 80/50 range. Chest x-ray was obtained and negative for acute cardiopulmonary disease. CBC was obtained and normal. CMP returns notable for mild hypokalemia at 3.2. Urine is grossly negative for infection initial lactic acid is elevated to a value of  3.36. Patient was bolused with 3 L of normal saline in the ED, blood and urine cultures were obtained, and empiric vancomycin, Zosyn, and Tamiflu were administered. Pain was managed with a dose of Toradol and Dilaudid. Seasonal flu PCR returned positive for influenza type A. Patient's heart rate and blood pressure improved with IV fluids, he remained hemodynamically stable, and will be admitted to the telemetry unit for ongoing evaluation and management of sepsis suspected secondary to a viral illness, namely influenza A.      Hospital Course:     Severe sepsis with hypotension due to influenza A infection. Treated with IV fluids and supportive care along with Tamiflu, sepsis physiology has resolved, he is close to his baseline and almost completely symptom free.  Continue Tamiflu for total of 5 days. Note his HIV serologies pending request PCP to monitor and follow.  Acute on chronic abdominal pain and back pain. This is acute on chronic likely due to cough, continue home medications, CT abdomen and pelvis unremarkable, abdominal exam benign.  Hypokalemia and hypomagnesemia. Both replaced. Recheck by PCP next visit.  Smoking. Counseled to quit.    Discharge Condition: Stable  Follow UP  Follow-up Information    Follow up with Cathlean Cower, MD. Schedule an appointment as soon as possible for a visit in 2 days.   Specialties:  Internal Medicine, Radiology   Contact information:   [address] [place] [address] [phone]        Consults obtained - None  Diet and Activity recommendation: See Discharge Instructions below  Discharge Instructions       Discharge Instructions    Diet - low sodium heart healthy    Complete by:  As directed      Discharge instructions    Complete by:  As directed   Follow with Primary MD Cathlean Cower, MD in 2-3 days   Get CBC, CMP, Magnesium, 2 view Chest X ray checked  by Primary MD next visit.    Activity: As tolerated with Full fall precautions use walker/cane & assistance as needed   Disposition Home     Diet:   Heart Healthy    For Heart failure patients - Check your Weight same time everyday, if you gain over 2 pounds, or you develop in leg swelling, experience more shortness of breath or chest pain, call your Primary MD immediately. Follow Cardiac Low Salt Diet and 1.5 lit/day fluid restriction.   On your next visit with your primary care physician please Get Medicines reviewed and adjusted.   Please request your Prim.MD to go over all Hospital Tests and Procedure/Radiological results at the follow up, please get all Hospital records sent to your Prim MD by signing hospital release before you go home.   If you experience worsening of your admission symptoms, develop shortness  of breath, life threatening emergency, suicidal or homicidal thoughts you must seek medical attention immediately by calling 911 or calling your MD immediately  if symptoms less severe.  You Must read complete instructions/literature along with all the possible adverse reactions/side effects for all the Medicines you take and that have been prescribed to you. Take any new Medicines after you have completely understood and accpet all the possible adverse reactions/side effects.   Do not drive, operating heavy machinery, perform activities at heights, swimming or participation in water activities or provide baby sitting services if your were admitted for syncope or siezures until you have seen by Primary MD or a  Neurologist and advised to do so again.  Do not drive when taking Pain medications.    Do not take more than prescribed Pain, Sleep and Anxiety Medications  Special Instructions: If you have smoked or chewed Tobacco  in the last 2 yrs please stop smoking, stop any regular Alcohol  and or any Recreational drug use.  Wear Seat belts while driving.   Please note  You were cared for by a hospitalist during your hospital stay. If you have any questions about your discharge medications or the care you received while you were in the hospital after you are discharged, you can call the unit and asked to speak with the hospitalist on call if the hospitalist that took care of you is not available. Once you are discharged, your primary care physician will handle any further medical issues. Please note that NO REFILLS for any discharge medications will be authorized once you are discharged, as it is imperative that you return to your primary care physician (or establish a relationship with a primary care physician if you do not have one) for your aftercare needs so that they can reassess your need for medications and monitor your lab values.     Increase activity slowly    Complete by:  As directed                Discharge Medications       Medication List    TAKE these medications        ALPRAZolam 0.25 MG tablet  Commonly known as:  XANAX  TAKE 1 TABLET 3 TIMES A DAY.     escitalopram 10 MG tablet  Commonly known as:  LEXAPRO  Take 1 tablet (10 mg total) by mouth daily.     GOODY HEADACHE PO  Take 2 packets by mouth daily as needed (headache).     ondansetron 4 MG disintegrating tablet  Commonly known as:  ZOFRAN ODT  Take 1 tablet (4 mg total) by mouth every 8 (eight) hours as needed for nausea or vomiting.     oseltamivir 75 MG capsule  Commonly known as:  TAMIFLU  Take 1 capsule (75 mg total) by mouth 2 (two) times daily.     oxycodone 30 MG immediate release tablet  Commonly known as:  ROXICODONE  Take 30 mg by mouth 6 (six) times daily.     zolpidem 12.5 MG CR tablet  Commonly known as:  AMBIEN CR  Take 1 tablet (12.5 mg total) by mouth at bedtime as needed for sleep.        Major procedures and Radiology Reports - PLEASE review detailed and final reports for all details, in brief -       Dg Chest 2 View  12/20/2015  CLINICAL DATA:  C/o lower back pain, bilateral leg pain, mid abd pain, non-productive cough, SOB, nausea, vomiting fever and diarrhea since last night. Ps' temp 103 in ED Hx current smoker of 1 PPD. EXAM: CHEST  2 VIEW COMPARISON:  11/16/2015 FINDINGS: Normal heart size and pulmonary vascularity. Fine nodular perihilar infiltration in the lungs probably due to chronic bronchitis. Similar appearance to previous study. No focal airspace disease or consolidation to suggest acute infiltration. No blunting of costophrenic angles. No pneumothorax. Mediastinal contours appear intact. IMPRESSION: No active cardiopulmonary disease. Electronically Signed   By: Lucienne Capers M.D.   On: 12/20/2015 21:30   Ct Abdomen Pelvis W Contrast  12/20/2015  CLINICAL DATA:  Periumbilical abdominal pain since yesterday. Fever  and cough today. EXAM: CT ABDOMEN AND  PELVIS WITH CONTRAST TECHNIQUE: Multidetector CT imaging of the abdomen and pelvis was performed using the standard protocol following bolus administration of intravenous contrast. CONTRAST:  142mL OMNIPAQUE IOHEXOL 300 MG/ML  SOLN COMPARISON:  02/12/2015 FINDINGS: Lung bases are clear. Mild diffuse fatty infiltration of the liver. The gallbladder, pancreas, spleen, adrenal glands, kidneys, abdominal aorta, inferior vena cava, and retroperitoneal lymph nodes are unremarkable. Stomach, small bowel, and colon are not abnormally distended. No free air or free fluid in the abdomen. Abdominal wall musculature appears intact. Pelvis: Appendix is not identified. Prostate gland is not enlarged. No free or loculated pelvic fluid collections. No pelvic mass or lymphadenopathy. Bladder wall is not thickened. Rectosigmoid colon is decompressed. No destructive bone lesions. IMPRESSION: No acute process demonstrated in the abdomen or pelvis. No evidence of bowel perforation or abscess. Appendix is not identified. Electronically Signed   By: Lucienne Capers M.D.   On: 12/20/2015 23:20    Micro Results      Recent Results (from the past 240 hour(s))  Culture, blood (routine x 2)     Status: None (Preliminary result)   Collection Time: 12/20/15  8:15 PM  Result Value Ref Range Status   Specimen Description BLOOD RIGHT ANTECUBITAL  Final   Special Requests BOTTLES DRAWN AEROBIC AND ANAEROBIC 5CC EA  Final   Culture NO GROWTH < 24 HOURS  Final   Report Status PENDING  Incomplete  Culture, blood (routine x 2)     Status: None (Preliminary result)   Collection Time: 12/20/15  8:20 PM  Result Value Ref Range Status   Specimen Description BLOOD LEFT ANTECUBITAL  Final   Special Requests BOTTLES DRAWN AEROBIC AND ANAEROBIC 5CC EA  Final   Culture NO GROWTH < 24 HOURS  Final   Report Status PENDING  Incomplete  Urine culture     Status: None   Collection Time: 12/20/15 10:01 PM  Result Value Ref Range Status    Specimen Description URINE, CLEAN CATCH  Final   Special Requests NONE  Final   Culture NO GROWTH 2 DAYS  Final   Report Status 12/22/2015 FINAL  Final       Today   Subjective    Jason [name] today has no headache,no chest abdominal pain,no new weakness tingling or numbness, feels much better wants to go home today.    Objective   Blood pressure 113/75, pulse 60, temperature 99.2 F (37.3 C), temperature source Oral, resp. rate 17, height 5\' 8"  (1.727 m), weight 67 kg (147 lb 11.3 oz), SpO2 100 %.   Intake/Output Summary (Last 24 hours) at 12/22/15 0946 Last data filed at 12/21/15 1300  Gross per 24 hour  Intake    240 ml  Output      0 ml  Net    240 ml    Exam Awake Alert, Oriented x 3, No new F.N deficits, Normal affect [place].AT,PERRAL Supple Neck,No JVD, No cervical lymphadenopathy appriciated.  Symmetrical Chest wall movement, Good air movement bilaterally, CTAB RRR,No Gallops,Rubs or new Murmurs, No Parasternal Heave +ve B.Sounds, Abd Soft, Non tender, No organomegaly appriciated, No rebound -guarding or rigidity. No Cyanosis, Clubbing or edema, No new Rash or bruise   Data Review   CBC w Diff: Lab Results  Component Value Date   WBC 6.0 12/21/2015   WBC 15.9* 01/13/2014   HGB 11.5* 12/21/2015   HGB 18.0 01/13/2014   HCT 34.9* 12/21/2015   HCT 55.6* 01/13/2014  PLT 222 12/21/2015   PLT 298 01/13/2014   LYMPHOPCT 17 12/21/2015   MONOPCT 7 12/21/2015   EOSPCT 1 12/21/2015   BASOPCT 0 12/21/2015    CMP: Lab Results  Component Value Date   NA 142 12/21/2015   NA 135* 01/13/2014   K 3.4* 12/21/2015   K 3.4* 01/13/2014   CL 109 12/21/2015   CL 103 01/13/2014   CO2 23 12/21/2015   CO2 24 01/13/2014   BUN <5* 12/21/2015   BUN 10 01/13/2014   CREATININE 0.91 12/21/2015   CREATININE 1.44* 01/13/2014   PROT 6.9 12/20/2015   PROT 10.1* 01/13/2014   ALBUMIN 4.0 12/20/2015   ALBUMIN 5.3* 01/13/2014   BILITOT 0.8 12/20/2015   BILITOT 0.6 01/13/2014     ALKPHOS 49 12/20/2015   ALKPHOS 84 01/13/2014   AST 22 12/20/2015   AST 32 01/13/2014   ALT 14* 12/20/2015   ALT 36 01/13/2014  .   Total Time in preparing paper work, data evaluation and todays exam - 35 minutes  Thurnell Lose M.D on 12/22/2015 at 9:46 AM  [facility]   Office  [phone]

## 2015-12-22 NOTE — Progress Notes (Signed)
Jason [name] to be D/C'd Home per MD order.  Discussed prescriptions and follow up appointments with the patient. Prescriptions given to patient, medication list explained in detail. Pt verbalized understanding.    Medication List    TAKE these medications        ALPRAZolam 0.25 MG tablet  Commonly known as:  XANAX  TAKE 1 TABLET 3 TIMES A DAY.     escitalopram 10 MG tablet  Commonly known as:  LEXAPRO  Take 1 tablet (10 mg total) by mouth daily.     GOODY HEADACHE PO  Take 2 packets by mouth daily as needed (headache).     ondansetron 4 MG disintegrating tablet  Commonly known as:  ZOFRAN ODT  Take 1 tablet (4 mg total) by mouth every 8 (eight) hours as needed for nausea or vomiting.     oseltamivir 75 MG capsule  Commonly known as:  TAMIFLU  Take 1 capsule (75 mg total) by mouth 2 (two) times daily.  Notes to Patient:  Next dose due tonight     oxycodone 30 MG immediate release tablet  Commonly known as:  ROXICODONE  Take 30 mg by mouth 6 (six) times daily.     zolpidem 12.5 MG CR tablet  Commonly known as:  AMBIEN CR  Take 1 tablet (12.5 mg total) by mouth at bedtime as needed for sleep.        Filed Vitals:   12/22/15 0601 12/22/15 0734  BP: 137/81 113/75  Pulse: 90 60  Temp: 99.1 F (37.3 C) 99.2 F (37.3 C)  Resp: 16 17    Skin clean, dry and intact without evidence of skin break down, no evidence of skin tears noted. IV catheter discontinued intact. Site without signs and symptoms of complications. Dressing and pressure applied. Pt denies pain at this time. No complaints noted.  An After Visit Summary was printed and given to the patient. Patient escorted via [place], and D/C home via private auto.  Carole Civil RN [facility] Phone [phone]

## 2015-12-25 LAB — CULTURE, BLOOD (ROUTINE X 2)
CULTURE: NO GROWTH
Culture: NO GROWTH

## 2015-12-28 ENCOUNTER — Ambulatory Visit (INDEPENDENT_AMBULATORY_CARE_PROVIDER_SITE_OTHER): Payer: No Typology Code available for payment source | Admitting: Internal Medicine

## 2015-12-28 VITALS — BP 122/74 | HR 74 | Temp 98.3°F | Resp 20 | Wt 145.0 lb

## 2015-12-28 DIAGNOSIS — F419 Anxiety disorder, unspecified: Secondary | ICD-10-CM | POA: Diagnosis not present

## 2015-12-28 DIAGNOSIS — G4700 Insomnia, unspecified: Secondary | ICD-10-CM | POA: Diagnosis not present

## 2015-12-28 DIAGNOSIS — M519 Unspecified thoracic, thoracolumbar and lumbosacral intervertebral disc disorder: Secondary | ICD-10-CM

## 2015-12-28 DIAGNOSIS — J101 Influenza due to other identified influenza virus with other respiratory manifestations: Secondary | ICD-10-CM | POA: Diagnosis not present

## 2015-12-28 MED ORDER — ALPRAZOLAM 0.25 MG PO TABS: ORAL_TABLET | ORAL | Status: DC

## 2015-12-28 MED ORDER — ZOLPIDEM TARTRATE ER 12.5 MG PO TBCR: 12.5000 mg | EXTENDED_RELEASE_TABLET | Freq: Every evening | ORAL | Status: DC | PRN

## 2015-12-28 NOTE — Progress Notes (Signed)
Pre visit review using our clinic review tool, if applicable. No additional management support is needed unless otherwise documented below in the visit note. 

## 2015-12-28 NOTE — Assessment & Plan Note (Signed)
Symptomatically resolved, o/w stable overall by history and exam, recent data reviewed with pt, and pt to continue medical treatment as before,  to f/u any worsening symptoms or concerns Lab Results  Component Value Date   WBC 6.0 12/21/2015   HGB 11.5* 12/21/2015   HCT 34.9* 12/21/2015   PLT 222 12/21/2015   GLUCOSE 121* 12/21/2015   ALT 14* 12/20/2015   AST 22 12/20/2015   NA 142 12/21/2015   K 3.4* 12/21/2015   CL 109 12/21/2015   CREATININE 0.91 12/21/2015   BUN <5* 12/21/2015   CO2 23 12/21/2015   INR 1.34 12/21/2015

## 2015-12-28 NOTE — Patient Instructions (Addendum)
I think OK for no further labs or xray needed today  Please continue all other medications as before, and refills have been done if requested - the xanax, and ambien  Please have the pharmacy call with any other refills you may need.  Please continue your efforts at being more active, low cholesterol diet, and weight control.  Please keep your appointments with your specialists as you may have planned  Please return in 6 months, or sooner if needed

## 2015-12-28 NOTE — Assessment & Plan Note (Signed)
With chronic pain, to cont oxycodone asd,  to f/u any worsening symptoms or concerns

## 2015-12-28 NOTE — Assessment & Plan Note (Signed)
situationally worse recently but improved today, cont same tx,  to f/u any worsening symptoms or concerns

## 2015-12-28 NOTE — Progress Notes (Signed)
Subjective:    Patient ID: Jason [name], male    DOB: 1980/12/20, 35 y.o.   MRN: [id number]  HPI  Here to f/u with recent influenza A with sepsis, requiring hospn; In ED, patient was found to have temperature 37.5 C, saturating well on room air, with sinus tachycardia in the 110s, and blood pressure in the 80/50 range. Chest x-ray was obtained and negative for acute cardiopulmonary disease. CBC was obtained and normal. CMP returns notable for mild hypokalemia at 3.2. Urine is grossly negative for infection initial lactic acid is elevated to a value of 3.36. Patient was bolused with 3 L of normal saline in the ED, blood and urine cultures were obtained, and empiric vancomycin, Zosyn, and Tamiflu were administered. Pain was managed with a dose of Toradol and Dilaudid. Seasonal flu PCR returned positive for influenza type A. Patient's heart rate and blood pressure improved with IV fluids, he remained hemodynamically stable, and will be admitted to the telemetry unit for ongoing evaluation and management of sepsis suspected secondary to a viral illness, namely influenza A. Now symptoms completely resolved - denies fever, cough, ST, myalgias or chills, abd pain, diarrhea.  Feels a bit weak but o/w ok. Denies urinary symptoms such as dysuria, frequency, urgency, flank pain, hematuria or n/v, fever, chills.  Denies worsening depressive symptoms, suicidal ideation, or panic; has ongoing anxiety, not increased recently, needs med refills.  Insomnia nightly is still a signficant issue, asks for ambien refill as has been working well.  Still smoking - hard to quit, thinking of stopping smoking with nicotine patch as seemed to help during last hospn. Pt continues to have recurring LBP without change in severity, bowel or bladder change, fever, wt loss,  worsening LE pain/numbness/weakness, gait change or falls.  Past Medical History  Diagnosis Date  . Back injury   . Chronic back pain   . MIGRAINE, COMMON  09/23/2010    Qualifier: Diagnosis of  By: Jenny Reichmann MD, Hunt Oris   . Lumbar disc disease   . Cervical disc disease   . GERD (gastroesophageal reflux disease)   . Anxiety   . Insomnia    No past surgical history on file.  reports that he has been smoking.  He has never used smokeless tobacco. He reports that he drinks alcohol. He reports that he does not use illicit drugs. family history includes Congestive Heart Failure in his father; Hyperlipidemia in his mother; Hypertension in his mother. No Known Allergies Current Outpatient Prescriptions on File Prior to Visit  Medication Sig Dispense Refill  . Aspirin-Acetaminophen-Caffeine (GOODY HEADACHE PO) Take 2 packets by mouth daily as needed (headache).     . ondansetron (ZOFRAN ODT) 4 MG disintegrating tablet Take 1 tablet (4 mg total) by mouth every 8 (eight) hours as needed for nausea or vomiting. 10 tablet 0  . oseltamivir (TAMIFLU) 75 MG capsule Take 1 capsule (75 mg total) by mouth 2 (two) times daily. 8 capsule 0  . oxycodone (ROXICODONE) 30 MG immediate release tablet Take 30 mg by mouth 6 (six) times daily.     Marland Kitchen escitalopram (LEXAPRO) 10 MG tablet Take 1 tablet (10 mg total) by mouth daily. 90 tablet 3   No current facility-administered medications on file prior to visit.   Review of Systems  Constitutional: Negative for unusual diaphoresis or night sweats HENT: Negative for ringing in ear or discharge Eyes: Negative for double vision or worsening visual disturbance.  Respiratory: Negative for choking and stridor.   Gastrointestinal:  Negative for vomiting or other signifcant bowel change Genitourinary: Negative for hematuria or change in urine volume.  Musculoskeletal: Negative for other MSK pain or swelling Skin: Negative for color change and worsening wound.  Neurological: Negative for tremors and numbness other than noted  Psychiatric/Behavioral: Negative for decreased concentration or agitation other than above         Objective:   Physical Exam BP 122/74 mmHg  Pulse 74  Temp(Src) 98.3 F (36.8 C) (Oral)  Resp 20  Wt 145 lb (65.772 kg)  SpO2 96% VS noted, not ill appearing Constitutional: Pt appears in no significant distress HENT: Head: NCAT.  Right Ear: External ear normal.  Left Ear: External ear normal.  Eyes: . Pupils are equal, round, and reactive to light. Conjunctivae and EOM are normal Neck: Normal range of motion. Neck supple.  Cardiovascular: Normal rate and regular rhythm.   Pulmonary/Chest: Effort normal and breath sounds without rales or wheezing.  Abd:  Soft, NT, ND, + BS Neurological: Pt is alert. Not confused , motor grossly intact Skin: Skin is warm. No rash, no LE edema Psychiatric: Pt behavior is normal. No agitation.      Assessment & Plan:

## 2015-12-28 NOTE — Assessment & Plan Note (Signed)
Ok for ambien refill,  to f/u any worsening symptoms or concerns 

## 2016-01-15 ENCOUNTER — Telehealth: Payer: Self-pay

## 2016-01-15 NOTE — Telephone Encounter (Signed)
Pt declined flu shot for this season.

## 2016-03-09 ENCOUNTER — Encounter (HOSPITAL_COMMUNITY): Payer: Self-pay | Admitting: Emergency Medicine

## 2016-03-09 ENCOUNTER — Emergency Department (HOSPITAL_COMMUNITY)
Admission: EM | Admit: 2016-03-09 | Discharge: 2016-03-09 | Disposition: A | Discharge status: Home / Self Care | Payer: No Typology Code available for payment source | Attending: Emergency Medicine | Admitting: Emergency Medicine

## 2016-03-09 DIAGNOSIS — N390 Urinary tract infection, site not specified: Secondary | ICD-10-CM

## 2016-03-09 DIAGNOSIS — R109 Unspecified abdominal pain: Secondary | ICD-10-CM | POA: Diagnosis not present

## 2016-03-09 DIAGNOSIS — Z791 Long term (current) use of non-steroidal anti-inflammatories (NSAID): Secondary | ICD-10-CM | POA: Diagnosis not present

## 2016-03-09 DIAGNOSIS — Z79899 Other long term (current) drug therapy: Secondary | ICD-10-CM | POA: Diagnosis not present

## 2016-03-09 DIAGNOSIS — F17200 Nicotine dependence, unspecified, uncomplicated: Secondary | ICD-10-CM | POA: Diagnosis not present

## 2016-03-09 DIAGNOSIS — Z79891 Long term (current) use of opiate analgesic: Secondary | ICD-10-CM | POA: Insufficient documentation

## 2016-03-09 DIAGNOSIS — N3091 Cystitis, unspecified with hematuria: Secondary | ICD-10-CM | POA: Insufficient documentation

## 2016-03-09 DIAGNOSIS — Z7982 Long term (current) use of aspirin: Secondary | ICD-10-CM | POA: Insufficient documentation

## 2016-03-09 DIAGNOSIS — M549 Dorsalgia, unspecified: Secondary | ICD-10-CM | POA: Diagnosis not present

## 2016-03-09 DIAGNOSIS — R300 Dysuria: Secondary | ICD-10-CM | POA: Diagnosis present

## 2016-03-09 DIAGNOSIS — R319 Hematuria, unspecified: Secondary | ICD-10-CM

## 2016-03-09 HISTORY — DX: Sickle-cell trait: D57.3

## 2016-03-09 LAB — URINALYSIS, ROUTINE W REFLEX MICROSCOPIC
Bilirubin Urine: NEGATIVE
Glucose, UA: NEGATIVE mg/dL
Ketones, ur: NEGATIVE mg/dL
Nitrite: NEGATIVE
Protein, ur: NEGATIVE mg/dL
Specific Gravity, Urine: 1.018 (ref 1.005–1.030)
pH: 6 (ref 5.0–8.0)

## 2016-03-09 LAB — URINE MICROSCOPIC-ADD ON

## 2016-03-09 MED ORDER — IBUPROFEN 400 MG PO TABS
600.0000 mg | ORAL_TABLET | Freq: Once | ORAL | Status: AC
  Administered 2016-03-09: 600 mg via ORAL
  Filled 2016-03-09: qty 1

## 2016-03-09 MED ORDER — IBUPROFEN 600 MG PO TABS: 600.0000 mg | ORAL_TABLET | Freq: Four times a day (QID) | ORAL | Status: DC | PRN

## 2016-03-09 MED ORDER — CEPHALEXIN 500 MG PO CAPS: 500.0000 mg | ORAL_CAPSULE | Freq: Three times a day (TID) | ORAL | Status: DC

## 2016-03-09 NOTE — ED Provider Notes (Signed)
CSN: [id number]     Arrival date & time 03/09/16  1010 History   First MD Initiated Contact with Patient 03/09/16 1100     Chief Complaint  Patient presents with  . Flank Pain  . Dysuria     (Consider location/radiation/quality/duration/timing/severity/associated sxs/prior Treatment) HPI Comments: Patient states that he had one episode of hematuria this morning while urinating. He has been having some associated left achy flank pain.  Patient is a 35 y.o. male presenting with flank pain and hematuria.  Flank Pain This is a new problem. The current episode started today. The problem occurs constantly. Pertinent negatives include no abdominal pain, nausea or vomiting.  Hematuria This is a new problem. The current episode started today. Episode frequency: once. The problem has been unchanged. Pertinent negatives include no abdominal pain, nausea or vomiting. Nothing aggravates the symptoms. He has tried nothing for the symptoms.    Past Medical History  Diagnosis Date  . Back injury   . Chronic back pain   . MIGRAINE, COMMON 09/23/2010    Qualifier: Diagnosis of  By: Jenny Reichmann MD, Hunt Oris   . Lumbar disc disease   . Cervical disc disease   . GERD (gastroesophageal reflux disease)   . Anxiety   . Insomnia   . Sickle cell trait ([place])    History reviewed. No pertinent past surgical history. Family History  Problem Relation Age of Onset  . Congestive Heart Failure Father   . Hypertension Mother   . Hyperlipidemia Mother    Social History  Substance Use Topics  . Smoking status: Current Every Day Smoker -- 1.00 packs/day  . Smokeless tobacco: Never Used  . Alcohol Use: Yes     Comment: occaisionally    Review of Systems  Gastrointestinal: Negative for nausea, vomiting, abdominal pain, diarrhea, constipation and blood in stool.  Genitourinary: Positive for dysuria, hematuria and flank pain. Negative for urgency, discharge, scrotal swelling, difficulty urinating, penile pain and  testicular pain.  Musculoskeletal: Positive for back pain (chronic).  All other systems reviewed and are negative.     Allergies  Other  Home Medications   Prior to Admission medications   Medication Sig Start Date End Date Taking? Authorizing Provider  ALPRAZolam (XANAX) 0.25 MG tablet TAKE 1 TABLET 3 TIMES A DAY. Patient taking differently: Take 0.25 mg by mouth 3 (three) times daily. TAKE 1 TABLET 3 TIMES A DAY. 12/28/15  Yes Biagio Borg, MD  Aspirin-Acetaminophen-Caffeine (GOODY HEADACHE PO) Take 2 packets by mouth daily as needed (headache).    Yes Historical Provider, MD  oxycodone (ROXICODONE) 30 MG immediate release tablet Take 30 mg by mouth 6 (six) times daily.    Yes Historical Provider, MD  cephALEXin (KEFLEX) 500 MG capsule Take 1 capsule (500 mg total) by mouth 3 (three) times daily. 03/09/16   Leo Grosser, MD  escitalopram (LEXAPRO) 10 MG tablet Take 1 tablet (10 mg total) by mouth daily. Patient not taking: Reported on 03/09/2016 02/17/15 05/18/15  Biagio Borg, MD  ibuprofen (ADVIL,MOTRIN) 600 MG tablet Take 1 tablet (600 mg total) by mouth every 6 (six) hours as needed. 03/09/16   Leo Grosser, MD  zolpidem (AMBIEN CR) 12.5 MG CR tablet Take 1 tablet (12.5 mg total) by mouth at bedtime as needed for sleep. Patient not taking: Reported on 03/09/2016 12/28/15   Biagio Borg, MD   BP 106/77 mmHg  Pulse 57  Temp(Src) 97.9 F (36.6 C) (Oral)  Resp 18  Ht 5\' 8"  (1.727  m)  Wt 66.679 kg  BMI 22.36 kg/m2  SpO2 100% Physical Exam  Constitutional: He is oriented to person, place, and time. He appears well-developed and well-nourished.  Eyes: Conjunctivae and EOM are normal. Pupils are equal, round, and reactive to light.  Neck: Normal range of motion. Neck supple.  Cardiovascular: Normal rate and regular rhythm.   No murmur heard. Pulmonary/Chest: Effort normal and breath sounds normal. No respiratory distress. He has no wheezes. He has no rales.  Abdominal: Soft. Bowel sounds  are normal. He exhibits no distension. There is no tenderness. There is no rebound, no guarding and no CVA tenderness.  Genitourinary: Circumcised. No penile erythema. No discharge found.  Dried blood at meatus  Musculoskeletal: Normal range of motion. He exhibits no edema or tenderness.  Lymphadenopathy:    He has no cervical adenopathy.  Neurological: He is alert and oriented to person, place, and time.    ED Course  Procedures (including critical care time) Labs Review Labs Reviewed  URINALYSIS, ROUTINE W REFLEX MICROSCOPIC (NOT AT [facility]) - Abnormal; Notable for the following:    APPearance CLOUDY (*)    Hgb urine dipstick LARGE (*)    Leukocytes, UA SMALL (*)    All other components within normal limits  URINE MICROSCOPIC-ADD ON - Abnormal; Notable for the following:    Squamous Epithelial / LPF 0-5 (*)    Bacteria, UA RARE (*)    All other components within normal limits  URINE CULTURE    Imaging Review No results found. I have personally reviewed and evaluated these images and lab results as part of my medical decision-making.   EKG Interpretation None      MDM   Final diagnoses:  Urinary tract infection with hematuria, site unspecified    Patient with possible kidney stone versus UTI. Urinalysis not significant for infection but with gross hematuria and dysuria, will treat with Keflex since flank pain is not very significant. Ultrasound significant for no hydronephrosis. Will obtain culture. Advised patient to follow-up with urology. Patient to return if symptoms worsen. Patient agrees with plan and is stable for discharge home.    Mariel Aloe, MD 03/09/16 1339  Mariel Aloe, MD 03/09/16 1339  Leo Grosser, MD 03/09/16 1901

## 2016-03-09 NOTE — Discharge Instructions (Signed)
You likely have kidney stone. Will give you some prescription strength ibuprofen to help with your symptoms. Please increase oral intake of fluids. Please follow-up with urology. If symptoms worsen, eczema hesitate to return for reevaluation.  Kidney Stones Kidney stones (urolithiasis) are deposits that form inside your kidneys. The intense pain is caused by the stone moving through the urinary tract. When the stone moves, the ureter goes into spasm around the stone. The stone is usually passed in the urine.  CAUSES   A disorder that makes certain neck glands produce too much parathyroid hormone (primary hyperparathyroidism).  A buildup of uric acid crystals, similar to gout in your joints.  Narrowing (stricture) of the ureter.  A kidney obstruction present at birth (congenital obstruction).  Previous surgery on the kidney or ureters.  Numerous kidney infections. SYMPTOMS   Feeling sick to your stomach (nauseous).  Throwing up (vomiting).  Blood in the urine (hematuria).  Pain that usually spreads (radiates) to the groin.  Frequency or urgency of urination. DIAGNOSIS   Taking a history and physical exam.  Blood or urine tests.  CT scan.  Occasionally, an examination of the inside of the urinary bladder (cystoscopy) is performed. TREATMENT   Observation.  Increasing your fluid intake.  Extracorporeal shock wave lithotripsy--This is a noninvasive procedure that uses shock waves to break up kidney stones.  Surgery may be needed if you have severe pain or persistent obstruction. There are various surgical procedures. Most of the procedures are performed with the use of small instruments. Only small incisions are needed to accommodate these instruments, so recovery time is minimized. The size, location, and chemical composition are all important variables that will determine the proper choice of action for you. Talk to your health care provider to better understand your  situation so that you will minimize the risk of injury to yourself and your kidney.  HOME CARE INSTRUCTIONS   Drink enough water and fluids to keep your urine clear or pale yellow. This will help you to pass the stone or stone fragments.  Strain all urine through the provided strainer. Keep all particulate matter and stones for your health care provider to see. The stone causing the pain may be as small as a grain of salt. It is very important to use the strainer each and every time you pass your urine. The collection of your stone will allow your health care provider to analyze it and verify that a stone has actually passed. The stone analysis will often identify what you can do to reduce the incidence of recurrences.  Only take over-the-counter or prescription medicines for pain, discomfort, or fever as directed by your health care provider.  Keep all follow-up visits as told by your health care provider. This is important.  Get follow-up X-rays if required. The absence of pain does not always mean that the stone has passed. It may have only stopped moving. If the urine remains completely obstructed, it can cause loss of kidney function or even complete destruction of the kidney. It is your responsibility to make sure X-rays and follow-ups are completed. Ultrasounds of the kidney can show blockages and the status of the kidney. Ultrasounds are not associated with any radiation and can be performed easily in a matter of minutes.  Make changes to your daily diet as told by your health care provider. You may be told to:  Limit the amount of salt that you eat.  Eat 5 or more servings of fruits and vegetables  each day.  Limit the amount of meat, poultry, fish, and eggs that you eat.  Collect a 24-hour urine sample as told by your health care provider.You may need to collect another urine sample every 6-12 months. SEEK MEDICAL CARE IF:  You experience pain that is progressive and unresponsive to  any pain medicine you have been prescribed. SEEK IMMEDIATE MEDICAL CARE IF:   Pain cannot be controlled with the prescribed medicine.  You have a fever or shaking chills.  The severity or intensity of pain increases over 18 hours and is not relieved by pain medicine.  You develop a new onset of abdominal pain.  You feel faint or pass out.  You are unable to urinate.   This information is not intended to replace advice given to you by your health care provider. Make sure you discuss any questions you have with your health care provider.   Document Released: 10/03/2005 Document Revised: 06/24/2015 Document Reviewed: 03/06/2013 Elsevier Interactive Patient Education [employer].

## 2016-03-09 NOTE — ED Notes (Addendum)
Dysuria  Started  This am and then he had some blood in ua am. Woke up with erection, it was difficult to void, had a lump by testicle for a couple of weeks, allergic to an antibiotic unkn which one /

## 2016-03-09 NOTE — ED Provider Notes (Signed)
I saw and evaluated the patient, reviewed the resident's note and I agree with the findings and plan. Please see associated encounter note.   EKG Interpretation None      35 y.o. male presents with dysuria starting today noting blood in urine. Pt has had microscopic hematuria on all prior samples available for review here. Increased on today's exam. Acute on chronic back pain worse on left. No leuks but small bacteria and increased blood concerning for possible infection. Has had prior UTI once before where he had n/v symptoms and fever. Doubt stone with no hydro, no typical colic-type symptoms. Will cover empirically with keflex pending culture for infection and routine OP f/u with urology for further hematuria workup non-emergently. No evidence of obstruction or sepsis currently so no further emergent workup is necessary today.  Emergency Focused Ultrasound Exam Limited Retroperitoneal Ultrasound of Kidneys  Performed and interpreted by Dr. Laneta Simmers Focused abdominal ultrasound with both kidneys imaged in transverse and longitudinal planes in real-time. Indication: flank pain Findings: bilateral kidneys present, no shadowing, no anechoic areas Interpretation: no hydronephrosis visualized.  no stones or cysts visualized  Images archived electronically  CPT Code: [id number]   Leo Grosser, MD 03/09/16 1331

## 2016-03-10 LAB — URINE CULTURE

## 2016-03-15 ENCOUNTER — Telehealth: Payer: Self-pay | Admitting: *Deleted

## 2016-03-15 MED ORDER — ALPRAZOLAM 0.25 MG PO TABS: ORAL_TABLET | ORAL | Status: DC

## 2016-03-15 NOTE — Telephone Encounter (Signed)
Done hardcopy to Corinne  

## 2016-03-15 NOTE — Telephone Encounter (Signed)
Rec'd fax pt requesting renewal  On his alprazolam. Last filled 01/16/16...Johny Chess

## 2016-03-15 NOTE — Telephone Encounter (Signed)
Faxed script back to [facility].../lmb 

## 2016-03-22 ENCOUNTER — Emergency Department (HOSPITAL_COMMUNITY)
Admission: EM | Admit: 2016-03-22 | Discharge: 2016-03-22 | Disposition: A | Discharge status: Home / Self Care | Payer: BLUE CROSS/BLUE SHIELD | Attending: Emergency Medicine | Admitting: Emergency Medicine

## 2016-03-22 ENCOUNTER — Encounter (HOSPITAL_COMMUNITY): Payer: Self-pay | Admitting: *Deleted

## 2016-03-22 ENCOUNTER — Emergency Department (HOSPITAL_COMMUNITY): Payer: BLUE CROSS/BLUE SHIELD

## 2016-03-22 DIAGNOSIS — G8929 Other chronic pain: Secondary | ICD-10-CM | POA: Insufficient documentation

## 2016-03-22 DIAGNOSIS — Z792 Long term (current) use of antibiotics: Secondary | ICD-10-CM | POA: Diagnosis not present

## 2016-03-22 DIAGNOSIS — S3992XA Unspecified injury of lower back, initial encounter: Secondary | ICD-10-CM | POA: Insufficient documentation

## 2016-03-22 DIAGNOSIS — Y990 Civilian activity done for income or pay: Secondary | ICD-10-CM | POA: Diagnosis not present

## 2016-03-22 DIAGNOSIS — Y9389 Activity, other specified: Secondary | ICD-10-CM | POA: Diagnosis not present

## 2016-03-22 DIAGNOSIS — F419 Anxiety disorder, unspecified: Secondary | ICD-10-CM | POA: Diagnosis not present

## 2016-03-22 DIAGNOSIS — Z8679 Personal history of other diseases of the circulatory system: Secondary | ICD-10-CM | POA: Insufficient documentation

## 2016-03-22 DIAGNOSIS — Z8719 Personal history of other diseases of the digestive system: Secondary | ICD-10-CM | POA: Diagnosis not present

## 2016-03-22 DIAGNOSIS — Y9289 Other specified places as the place of occurrence of the external cause: Secondary | ICD-10-CM | POA: Insufficient documentation

## 2016-03-22 DIAGNOSIS — X58XXXA Exposure to other specified factors, initial encounter: Secondary | ICD-10-CM | POA: Insufficient documentation

## 2016-03-22 DIAGNOSIS — Z862 Personal history of diseases of the blood and blood-forming organs and certain disorders involving the immune mechanism: Secondary | ICD-10-CM | POA: Diagnosis not present

## 2016-03-22 DIAGNOSIS — M545 Low back pain, unspecified: Secondary | ICD-10-CM

## 2016-03-22 DIAGNOSIS — Z79899 Other long term (current) drug therapy: Secondary | ICD-10-CM | POA: Diagnosis not present

## 2016-03-22 DIAGNOSIS — F17200 Nicotine dependence, unspecified, uncomplicated: Secondary | ICD-10-CM | POA: Diagnosis not present

## 2016-03-22 MED ORDER — NAPROXEN 500 MG PO TABS: 500.0000 mg | ORAL_TABLET | Freq: Two times a day (BID) | ORAL | Status: DC

## 2016-03-22 MED ORDER — HYDROCODONE-ACETAMINOPHEN 5-325 MG PO TABS
1.0000 | ORAL_TABLET | Freq: Once | ORAL | Status: AC
  Administered 2016-03-22: 1 via ORAL
  Filled 2016-03-22: qty 1

## 2016-03-22 MED ORDER — KETOROLAC TROMETHAMINE 30 MG/ML IJ SOLN
30.0000 mg | Freq: Once | INTRAMUSCULAR | Status: AC
  Administered 2016-03-22: 30 mg via INTRAMUSCULAR
  Filled 2016-03-22: qty 1

## 2016-03-22 MED ORDER — NAPROXEN 250 MG PO TABS
500.0000 mg | ORAL_TABLET | Freq: Once | ORAL | Status: AC
  Administered 2016-03-22: 500 mg via ORAL
  Filled 2016-03-22: qty 2

## 2016-03-22 MED ORDER — METHOCARBAMOL 500 MG PO TABS
500.0000 mg | ORAL_TABLET | Freq: Once | ORAL | Status: AC
  Administered 2016-03-22: 500 mg via ORAL
  Filled 2016-03-22: qty 1

## 2016-03-22 MED ORDER — METHOCARBAMOL 500 MG PO TABS: 500.0000 mg | ORAL_TABLET | Freq: Two times a day (BID) | ORAL | Status: DC

## 2016-03-22 NOTE — Discharge Instructions (Signed)
Please follow up with your neurosurgeon as soon as possible. You may take the prescriptions I give you in addition to the oxycodone you have at home. Return to the ER for new or worsening symptoms.

## 2016-03-22 NOTE — ED Notes (Signed)
Patient reports he was moving furniture and had lower back spasm that made him go down to his knees and then he laid on the floor. Patient has a history of back pain and does take oxycodone everyday last dose approx 4 hours ago. Patient is not able to sit up straight due to pain. All vitals wnl en route. No tx initiated pta.

## 2016-03-22 NOTE — ED Provider Notes (Signed)
CSN: [id number]     Arrival date & time 03/22/16  1745 History   First MD Initiated Contact with Patient 03/22/16 1755     Chief Complaint  Patient presents with  . Back Pain   HPI   Jason [name] is an 35 y.o. male with history of chronic back pain, herniated disc who presents to the ED for evaluation of low back pain after injury at work today. He states he was moving furniture and had sudden onset severe low back pain. He states the pain was so bad he dropped down tot he floor. He states he takes oxycodone for chronic pain and last dose was earlier this afternoon. He states now the pain is in his lower back and does not radiate. He states it is worse with movement and it hurts to move his legs. He states he does have a neurosurgeon as he has known herniated discs in his back. Denies new numbness, weakness, tingling. Denies hitting his head or LOC. He states he has not taken anything since the incident for his pain. Denies n/v, bowel/bladder incontinence.  Past Medical History  Diagnosis Date  . Back injury   . Chronic back pain   . MIGRAINE, COMMON 09/23/2010    Qualifier: Diagnosis of  By: Jenny Reichmann MD, Hunt Oris   . Lumbar disc disease   . Cervical disc disease   . GERD (gastroesophageal reflux disease)   . Anxiety   . Insomnia   . Sickle cell trait ([place])    History reviewed. No pertinent past surgical history. Family History  Problem Relation Age of Onset  . Congestive Heart Failure Father   . Hypertension Mother   . Hyperlipidemia Mother    Social History  Substance Use Topics  . Smoking status: Current Every Day Smoker -- 1.00 packs/day  . Smokeless tobacco: Never Used  . Alcohol Use: Yes     Comment: occaisionally    Review of Systems  All other systems reviewed and are negative.     Allergies  Other  Home Medications   Prior to Admission medications   Medication Sig Start Date End Date Taking? Authorizing Provider  ALPRAZolam Duanne Moron) 0.25 MG tablet TAKE 1 TABLET  3 TIMES A DAY. 03/15/16   Biagio Borg, MD  Aspirin-Acetaminophen-Caffeine (GOODY HEADACHE PO) Take 2 packets by mouth daily as needed (headache).     Historical Provider, MD  cephALEXin (KEFLEX) 500 MG capsule Take 1 capsule (500 mg total) by mouth 3 (three) times daily. 03/09/16   Leo Grosser, MD  escitalopram (LEXAPRO) 10 MG tablet Take 1 tablet (10 mg total) by mouth daily. Patient not taking: Reported on 03/09/2016 02/17/15 05/18/15  Biagio Borg, MD  ibuprofen (ADVIL,MOTRIN) 600 MG tablet Take 1 tablet (600 mg total) by mouth every 6 (six) hours as needed. 03/09/16   Leo Grosser, MD  methocarbamol (ROBAXIN) 500 MG tablet Take 1 tablet (500 mg total) by mouth 2 (two) times daily. 03/22/16   Olivia Canter Shanna Strength, PA-C  naproxen (NAPROSYN) 500 MG tablet Take 1 tablet (500 mg total) by mouth 2 (two) times daily. 03/22/16   Olivia Canter Linzi Ohlinger, PA-C  oxycodone (ROXICODONE) 30 MG immediate release tablet Take 30 mg by mouth 6 (six) times daily.     Historical Provider, MD  zolpidem (AMBIEN CR) 12.5 MG CR tablet Take 1 tablet (12.5 mg total) by mouth at bedtime as needed for sleep. Patient not taking: Reported on 03/09/2016 12/28/15   Biagio Borg, MD  BP 118/82 mmHg  Pulse 58  Temp(Src) 98.5 F (36.9 C) (Oral)  Resp 22  SpO2 99% Physical Exam  Constitutional: He is oriented to person, place, and time.  HENT:  Right Ear: External ear normal.  Left Ear: External ear normal.  Nose: Nose normal.  Mouth/Throat: Oropharynx is clear and moist. No oropharyngeal exudate.  Eyes: Conjunctivae and EOM are normal. Pupils are equal, round, and reactive to light.  Neck: Normal range of motion. Neck supple.  Cardiovascular: Normal rate, regular rhythm, normal heart sounds and intact distal pulses.   Pulmonary/Chest: Effort normal and breath sounds normal. No respiratory distress. He has no wheezes. He exhibits no tenderness.  Abdominal: Soft. Bowel sounds are normal. He exhibits no distension. There is no tenderness.   Musculoskeletal: He exhibits no edema.  +L spine tenderness. No paraspinal tenderness. No stepoff or deformity. +SLR. 5/5 strength bilat UE and LE  Neurological: He is alert and oriented to person, place, and time. No cranial nerve deficit.  Skin: Skin is warm and dry.  Psychiatric: He has a normal mood and affect.  Nursing note and vitals reviewed.   ED Course  Procedures (including critical care time) Labs Review Labs Reviewed - No data to display  Imaging Review Dg Lumbar Spine Complete  03/22/2016  CLINICAL DATA:  35 year old male.  With fall and back pain. EXAM: LUMBAR SPINE - COMPLETE 4+ VIEW COMPARISON:  CT dated 12/20/2015 FINDINGS: There is no acute fracture or subluxation of the lumbar spine. The vertebral body heights and growth plates are maintained. The visualized transverse and spinous processes are intact. The soft tissues appear unremarkable. IMPRESSION: No acute/traumatic lumbar spine pathology. Electronically Signed   By: Anner Crete M.D.   On: 03/22/2016 19:03   I have personally reviewed and evaluated these images and lab results as part of my medical decision-making.   EKG Interpretation None      MDM   Final diagnoses:  Midline low back pain without sciatica    X-ray was obtained given midline tenderness and was negative for acute findings. Neuro exam intact. Pain improved in the ED. Rx given for naproxen and robaxin which I discussed with pt he may take with home oxycodone. Instructed f/u with his neurosurgeon. ER return precautions given.    Anne Ng, PA-C 03/22/16 2016  Sherwood Gambler, MD 03/28/16 [phone]

## 2016-03-23 ENCOUNTER — Other Ambulatory Visit (INDEPENDENT_AMBULATORY_CARE_PROVIDER_SITE_OTHER): Payer: BLUE CROSS/BLUE SHIELD

## 2016-03-23 DIAGNOSIS — Z0189 Encounter for other specified special examinations: Secondary | ICD-10-CM

## 2016-03-23 DIAGNOSIS — Z0000 Encounter for general adult medical examination without abnormal findings: Secondary | ICD-10-CM

## 2016-03-23 LAB — LIPID PANEL
CHOL/HDL RATIO: 3
Cholesterol: 161 mg/dL (ref 0–200)
HDL: 56.4 mg/dL (ref >39.00)
LDL CALC: 79 mg/dL (ref 0–99)
NONHDL: 104.11
TRIGLYCERIDES: 126 mg/dL (ref 0.0–149.0)
VLDL: 25.2 mg/dL (ref 0.0–40.0)

## 2016-03-23 LAB — CBC WITH DIFFERENTIAL/PLATELET
BASOS ABS: 0 10*3/uL (ref 0.0–0.1)
Basophils Relative: 0.2 % (ref 0.0–3.0)
Eosinophils Absolute: 0.2 10*3/uL (ref 0.0–0.7)
Eosinophils Relative: 3 % (ref 0.0–5.0)
HCT: 43 % (ref 39.0–52.0)
Hemoglobin: 14.1 g/dL (ref 13.0–17.0)
LYMPHS ABS: 1.5 10*3/uL (ref 0.7–4.0)
Lymphocytes Relative: 24.6 % (ref 12.0–46.0)
MCHC: 32.8 g/dL (ref 30.0–36.0)
MCV: 82.1 fl (ref 78.0–100.0)
MONO ABS: 0.5 10*3/uL (ref 0.1–1.0)
Monocytes Relative: 9 % (ref 3.0–12.0)
NEUTROS PCT: 63.2 % (ref 43.0–77.0)
Neutro Abs: 3.8 10*3/uL (ref 1.4–7.7)
PLATELETS: 273 10*3/uL (ref 150.0–400.0)
RBC: 5.24 Mil/uL (ref 4.22–5.81)
RDW: 15.6 % — ABNORMAL HIGH (ref 11.5–15.5)
WBC: 6 10*3/uL (ref 4.0–10.5)

## 2016-03-23 LAB — BASIC METABOLIC PANEL
BUN: 11 mg/dL (ref 6–23)
CO2: 28 meq/L (ref 19–32)
Calcium: 9.2 mg/dL (ref 8.4–10.5)
Chloride: 107 mEq/L (ref 96–112)
Creatinine, Ser: 0.93 mg/dL (ref 0.40–1.50)
GFR: 119.27 mL/min (ref >60.00)
GLUCOSE: 104 mg/dL — AB (ref 70–99)
POTASSIUM: 3.9 meq/L (ref 3.5–5.1)
SODIUM: 141 meq/L (ref 135–145)

## 2016-03-23 LAB — URINALYSIS, ROUTINE W REFLEX MICROSCOPIC
Ketones, ur: NEGATIVE
LEUKOCYTES UA: NEGATIVE
Nitrite: NEGATIVE
SPECIFIC GRAVITY, URINE: 1.025 (ref 1.000–1.030)
URINE GLUCOSE: NEGATIVE
UROBILINOGEN UA: 0.2 (ref 0.0–1.0)
pH: 6 (ref 5.0–8.0)

## 2016-03-23 LAB — HEPATIC FUNCTION PANEL
ALBUMIN: 4.4 g/dL (ref 3.5–5.2)
ALK PHOS: 45 U/L (ref 39–117)
ALT: 9 U/L (ref 0–53)
AST: 10 U/L (ref 0–37)
Bilirubin, Direct: 0.1 mg/dL (ref 0.0–0.3)
TOTAL PROTEIN: 7.1 g/dL (ref 6.0–8.3)
Total Bilirubin: 0.5 mg/dL (ref 0.2–1.2)

## 2016-03-23 LAB — TSH: TSH: 0.89 u[IU]/mL (ref 0.35–4.50)

## 2016-03-29 ENCOUNTER — Encounter: Payer: Self-pay | Admitting: Internal Medicine

## 2016-03-29 ENCOUNTER — Ambulatory Visit (INDEPENDENT_AMBULATORY_CARE_PROVIDER_SITE_OTHER): Payer: BLUE CROSS/BLUE SHIELD | Admitting: Internal Medicine

## 2016-03-29 VITALS — BP 122/74 | HR 77 | Temp 98.2°F | Resp 20 | Wt 142.0 lb

## 2016-03-29 DIAGNOSIS — R6889 Other general symptoms and signs: Secondary | ICD-10-CM | POA: Diagnosis not present

## 2016-03-29 DIAGNOSIS — M545 Low back pain, unspecified: Secondary | ICD-10-CM

## 2016-03-29 DIAGNOSIS — F329 Major depressive disorder, single episode, unspecified: Secondary | ICD-10-CM

## 2016-03-29 DIAGNOSIS — G8929 Other chronic pain: Secondary | ICD-10-CM | POA: Insufficient documentation

## 2016-03-29 DIAGNOSIS — Z0001 Encounter for general adult medical examination with abnormal findings: Secondary | ICD-10-CM | POA: Diagnosis not present

## 2016-03-29 DIAGNOSIS — R310 Gross hematuria: Secondary | ICD-10-CM

## 2016-03-29 DIAGNOSIS — F32A Depression, unspecified: Secondary | ICD-10-CM

## 2016-03-29 HISTORY — DX: Low back pain, unspecified: M54.50

## 2016-03-29 MED ORDER — DULOXETINE HCL 60 MG PO CPEP: 60.0000 mg | ORAL_CAPSULE | Freq: Every day | ORAL | Status: DC

## 2016-03-29 MED ORDER — OXYCODONE HCL 30 MG PO TABS: 30.0000 mg | ORAL_TABLET | Freq: Every day | ORAL | Status: DC

## 2016-03-29 MED ORDER — DULOXETINE HCL 30 MG PO CPEP: 30.0000 mg | ORAL_CAPSULE | Freq: Every day | ORAL | Status: DC

## 2016-03-29 NOTE — Progress Notes (Signed)
Subjective:    Patient ID: Jason [name], male    DOB: [birth year], 35 y.o.   MRN: [id number]  HPI  Here for wellness and f/u;  Overall doing ok;  Pt denies Chest pain, worsening SOB, DOE, wheezing, orthopnea, PND, worsening LE edema, palpitations, dizziness or syncope.  Pt denies neurological change such as new headache, facial or extremity weakness.  Pt denies polydipsia, polyuria, or low sugar symptoms. Pt states overall good compliance with treatment and medications, good tolerability, and has been trying to follow appropriate diet.    Pt states good ability with ADL's, has low fall risk, home safety reviewed and adequate, no other significant changes in hearing or vision, and only occasionally active with exercise. Due for Tdap  Pt has had mild worsening depressive symptoms, but no suicidal ideation or panic. No fever, night sweats, wt loss, loss of appetite, or other constitutional symptoms. Pt also continues to have recurring LBP, bowel or bladder change, fever, wt loss,  worsening LE pain/numbness/weakness, gait change or falls, but having worsening pain, persistent.  Has appt June 28 with Dr Mirna Mires at [facility], needs referral.  Willing to try the cymbalta.  Also with c/o episode of gross hematuria with intermittent pain starting 1 wk ago or several days to right flank with some radiation to the RLQ, resolved when passed what he thinks was a stone rather abruptly af few days ago, at one point during this was seen at ED, was informed has hx of apparently microhematuria since 2013, no other evaluation. pt asks for urology referral. Denies urinary symptoms such as dysuria, frequency, urgency, flank pain, hematuria or n/v, fever, chills, except for the above.  Past Medical History  Diagnosis Date  . Back injury   . Chronic back pain   . MIGRAINE, COMMON 09/23/2010    Qualifier: Diagnosis of  By: Jenny Reichmann MD, Hunt Oris   . Lumbar disc disease   . Cervical disc disease   . GERD  (gastroesophageal reflux disease)   . Anxiety   . Insomnia   . Sickle cell trait ([place])   . Chronic low back pain 03/29/2016   No past surgical history on file.  reports that he has been smoking.  He has never used smokeless tobacco. He reports that he drinks alcohol. He reports that he does not use illicit drugs. family history includes Congestive Heart Failure in his father; Hyperlipidemia in his mother; Hypertension in his mother. Allergies  Allergen Reactions  . Other Hives and Swelling    Antibiotic that was given when he had pneumonia    Current Outpatient Prescriptions on File Prior to Visit  Medication Sig Dispense Refill  . ALPRAZolam (XANAX) 0.25 MG tablet TAKE 1 TABLET 3 TIMES A DAY. 90 tablet 1  . Aspirin-Acetaminophen-Caffeine (GOODY HEADACHE PO) Take 2 packets by mouth daily as needed (headache).     Marland Kitchen ibuprofen (ADVIL,MOTRIN) 600 MG tablet Take 1 tablet (600 mg total) by mouth every 6 (six) hours as needed. 30 tablet 0  . methocarbamol (ROBAXIN) 500 MG tablet Take 1 tablet (500 mg total) by mouth 2 (two) times daily. 20 tablet 0  . naproxen (NAPROSYN) 500 MG tablet Take 1 tablet (500 mg total) by mouth 2 (two) times daily. 30 tablet 0  . zolpidem (AMBIEN CR) 12.5 MG CR tablet Take 1 tablet (12.5 mg total) by mouth at bedtime as needed for sleep. 30 tablet 5  . escitalopram (LEXAPRO) 10 MG tablet Take 1 tablet (10 mg total) by  mouth daily. (Patient not taking: Reported on 03/09/2016) 90 tablet 3   No current facility-administered medications on file prior to visit.   Review of Systems Constitutional: Negative for increased diaphoresis, or other activity, appetite or siginficant weight change other than noted HENT: Negative for worsening hearing loss, ear pain, facial swelling, mouth sores and neck stiffness.   Eyes: Negative for other worsening pain, redness or visual disturbance.  Respiratory: Negative for choking or stridor Cardiovascular: Negative for other chest pain and  palpitations.  Gastrointestinal: Negative for worsening diarrhea, blood in stool, or abdominal distention Genitourinary: Negative for hematuria, flank pain or change in urine volume.  Musculoskeletal: Negative for myalgias or other joint complaints.  Skin: Negative for other color change and wound or drainage.  Neurological: Negative for syncope and numbness. other than noted Hematological: Negative for adenopathy. or other swelling Psychiatric/Behavioral: Negative for hallucinations, SI, self-injury, decreased concentration or other worsening agitation.      Objective:   Physical Exam BP 122/74 mmHg  Pulse 77  Temp(Src) 98.2 F (36.8 C) (Oral)  Resp 20  Wt 142 lb (64.411 kg)  SpO2 97% VS noted,  Constitutional: Pt is oriented to person, place, and time. Appears well-developed and well-nourished, in no significant distress Head: Normocephalic and atraumatic  Eyes: Conjunctivae and EOM are normal. Pupils are equal, round, and reactive to light Right Ear: External ear normal.  Left Ear: External ear normal Nose: Nose normal.  Mouth/Throat: Oropharynx is clear and moist  Neck: Normal range of motion. Neck supple. No JVD present. No tracheal deviation present or significant neck LA or mass Cardiovascular: Normal rate, regular rhythm, normal heart sounds and intact distal pulses.   Pulmonary/Chest: Effort normal and breath sounds without rales or wheezing  Abdominal: Soft. Bowel sounds are normal. NT. No HSM  Musculoskeletal: Normal range of motion. Exhibits no edema Lymphadenopathy: Has no cervical adenopathy.  Neurological: Pt is alert and oriented to person, place, and time. Pt has normal reflexes. No cranial nerve deficit. Motor grossly intact Skin: Skin is warm and dry. No rash noted or new ulcers Psychiatric:  Has normal mood and affect. Behavior is normal.   CLINICAL DATA: 35 year old male. With fall and back pain.  EXAM: LUMBAR SPINE - COMPLETE 4+ VIEW  COMPARISON:  CT dated 12/20/2015  FINDINGS: There is no acute fracture or subluxation of the lumbar spine. The vertebral body heights and growth plates are maintained. The visualized transverse and spinous processes are intact. The soft tissues appear unremarkable.  IMPRESSION: No acute/traumatic lumbar spine pathology.   Electronically Signed  By: Anner Crete M.D.  On: 03/22/2016 19:03    Assessment & Plan:

## 2016-03-29 NOTE — Patient Instructions (Signed)
You had the Tdap (tetanus) shot today  Please take all new medication as prescribed - the cymbalta (30 mg per day for the first wk, then 60 mg per day after that) for pain and low mood  Please continue all other medications as before, and refills have been done if requested - the 2 wks of oxycodone  Please have the pharmacy call with any other refills you may need.  Please continue your efforts at being more active, low cholesterol diet, and weight control.  You are otherwise up to date with prevention measures today.  Please keep your appointments with your specialists as you may have planned - June 28 with pain management (we will do the referral)  You will be contacted regarding the referral for: Urology  Please return in 1 year for your yearly visit, or sooner if needed, with Lab testing done 3-5 days before

## 2016-03-29 NOTE — Progress Notes (Signed)
Pre visit review using our clinic review tool, if applicable. No additional management support is needed unless otherwise documented below in the visit note. 

## 2016-04-01 DIAGNOSIS — R310 Gross hematuria: Secondary | ICD-10-CM | POA: Insufficient documentation

## 2016-04-01 DIAGNOSIS — F32A Depression, unspecified: Secondary | ICD-10-CM | POA: Insufficient documentation

## 2016-04-01 DIAGNOSIS — F329 Major depressive disorder, single episode, unspecified: Secondary | ICD-10-CM | POA: Insufficient documentation

## 2016-04-01 NOTE — Assessment & Plan Note (Signed)
[place] for refill bridge opiate tx , to f/u pain management as planned

## 2016-04-01 NOTE — Assessment & Plan Note (Signed)

## 2016-04-01 NOTE — Assessment & Plan Note (Addendum)
Has some microhemturia for several yrs per pt, now with episode gross hematuria, likely right renal colic and probable passed stone, for urology referral as per pt request  In addition to the time spent performing CPE, I spent an additional 25 minutes face to face,in which greater than 50% of this time was spent in counseling and coordination of care for patient's acute illness as documented.

## 2016-04-01 NOTE — Assessment & Plan Note (Signed)
With recent mild worsening symptoms, no SI or HI, also with chronic pain, now for cymbalta 30 qd x 7 days, then 60 qd,  to f/u any worsening symptoms or concerns

## 2016-06-01 ENCOUNTER — Telehealth: Payer: Self-pay | Admitting: *Deleted

## 2016-06-01 MED ORDER — ALPRAZOLAM 0.25 MG PO TABS: ORAL_TABLET | ORAL | 1 refills | Status: DC

## 2016-06-01 NOTE — Telephone Encounter (Signed)
Done hardcopy to Corinne  

## 2016-06-01 NOTE — Telephone Encounter (Signed)
Rec'd fax pt requesting refill on Alprazolam 0.25 mg. Last filled 04/15/16...Jason [name]

## 2016-06-01 NOTE — Telephone Encounter (Signed)
Faxed script back to [facility].../lmb 

## 2016-06-25 ENCOUNTER — Other Ambulatory Visit: Payer: Self-pay | Admitting: Internal Medicine

## 2016-06-28 NOTE — Telephone Encounter (Signed)
Done hardcopy to Corinne  

## 2016-06-28 NOTE — Telephone Encounter (Signed)
faxed

## 2016-11-03 ENCOUNTER — Ambulatory Visit: Payer: Self-pay | Admitting: Internal Medicine

## 2016-11-07 ENCOUNTER — Ambulatory Visit: Payer: Self-pay | Admitting: Nurse Practitioner

## 2017-01-09 ENCOUNTER — Telehealth (INDEPENDENT_AMBULATORY_CARE_PROVIDER_SITE_OTHER): Payer: Self-pay | Admitting: *Deleted

## 2017-01-09 NOTE — Telephone Encounter (Signed)
Pt called stating his arm and hand is numb and has been for some time. Pt requesting call back [phone]

## 2017-01-10 NOTE — Telephone Encounter (Signed)
lmom that he has to make an appointment with Dr. [name] for this problem.---I will tryo to call and make sure he gets an appointment scheduled

## 2017-01-11 NOTE — Telephone Encounter (Signed)
Please call patient and schedule next available appt with Dr. [name]. Thanks

## 2017-01-12 ENCOUNTER — Telehealth (INDEPENDENT_AMBULATORY_CARE_PROVIDER_SITE_OTHER): Payer: Self-pay | Admitting: Specialist

## 2017-01-12 NOTE — Telephone Encounter (Signed)
Called patient left message to return call to schedule appointment with Dr [name]. [phone]. Spoke with patient's mother at [phone] left message for patient to return call.

## 2017-01-12 NOTE — Telephone Encounter (Signed)
Patient scheduled 02/20/17 at 3:45pm with Dr [name]. I advised patient will contact him if there is a cancellation for earlier appointment.

## 2017-01-19 ENCOUNTER — Ambulatory Visit (INDEPENDENT_AMBULATORY_CARE_PROVIDER_SITE_OTHER): Payer: PRIVATE HEALTH INSURANCE

## 2017-01-19 ENCOUNTER — Encounter (INDEPENDENT_AMBULATORY_CARE_PROVIDER_SITE_OTHER): Payer: Self-pay | Admitting: Specialist

## 2017-01-19 ENCOUNTER — Ambulatory Visit (INDEPENDENT_AMBULATORY_CARE_PROVIDER_SITE_OTHER): Payer: PRIVATE HEALTH INSURANCE | Admitting: Specialist

## 2017-01-19 ENCOUNTER — Encounter (INDEPENDENT_AMBULATORY_CARE_PROVIDER_SITE_OTHER): Payer: Self-pay

## 2017-01-19 VITALS — BP 100/70 | HR 62 | Ht 68.0 in | Wt 150.0 lb

## 2017-01-19 DIAGNOSIS — M5412 Radiculopathy, cervical region: Secondary | ICD-10-CM | POA: Diagnosis not present

## 2017-01-19 DIAGNOSIS — M7541 Impingement syndrome of right shoulder: Secondary | ICD-10-CM | POA: Diagnosis not present

## 2017-01-19 DIAGNOSIS — M542 Cervicalgia: Secondary | ICD-10-CM | POA: Diagnosis not present

## 2017-01-19 MED ORDER — METHYLPREDNISOLONE ACETATE 40 MG/ML IJ SUSP
40.0000 mg | INTRAMUSCULAR | Status: AC | PRN
  Administered 2017-01-19: 40 mg via INTRA_ARTICULAR

## 2017-01-19 MED ORDER — BUPIVACAINE HCL 0.25 % IJ SOLN
4.0000 mL | INTRAMUSCULAR | Status: AC | PRN
  Administered 2017-01-19: 4 mL via INTRA_ARTICULAR

## 2017-01-19 MED ORDER — LIDOCAINE HCL 1 % IJ SOLN
3.0000 mL | INTRAMUSCULAR | Status: AC | PRN
  Administered 2017-01-19: 3 mL

## 2017-01-19 NOTE — Progress Notes (Signed)
Office Visit Note   Patient: Jason [name]           Date of Birth: 02-26-1981           MRN: [id number] Visit Date: 01/19/2017              Requested by: Jason Borg, MD Jason [name], Jason [name] [id number] PCP: Jason Cower, MD   Assessment & Plan: Visit Diagnoses:  1. Cervicalgia   2. Impingement syndrome of right shoulder     Plan: To give some relief of his right shoulder pain offered injection. After patient consent right shoulder subacromial Marcaine/Depo-Medrol injection was performed from a posterolateral approach. Tolerated without complication. For his progressive cervical radiculopathy that is now affecting the right upper extremity I will schedule cervical spine MRI to see if there has actually been progression of his disc herniation at C6-7. Follow-up in the clinic after to discuss results and further treatment options. We will continue pain management through Jason [name]. Currently taking oxycodone 30 mg.  Follow-Up Instructions: No Follow-up on file.   Orders:  Orders Placed This Encounter  Procedures  . XR Cervical Spine 2 or 3 views  . XR Shoulder Right   No orders of the defined types were placed in this encounter.     Procedures: Large Joint Inj Date/Time: 01/19/2017 11:53 AM Performed by: Jason [name] Authorized by: Jason [name]   Consent Given by:  Patient Indications:  Pain Location:  Shoulder Site:  R subacromial bursa Needle Size:  25 G Needle Length:  1.5 inches Approach:  Posterior Ultrasound Guidance: No   Fluoroscopic Guidance: No   Arthrogram: No   Medications:  3 mL lidocaine 1 %; 4 mL bupivacaine 0.25 %; 40 mg methylPREDNISolone acetate 40 MG/ML Aspiration Attempted: No   Patient tolerance:  Patient tolerated the procedure well with no immediate complications     Clinical Data: No additional findings.   Subjective: Chief Complaint  Patient presents with  . Neck - Follow-up, Pain, Numbness  . Right Arm -  Numbness    Jason [name] is here is here complaining of right arm and thumb numbness.  He states that this been going on for over 3 weeks.  He states that when he turns his head to the left or the right that the pain goes down the right arm.      Review of Systems  Constitutional: Positive for activity change.  HENT: Negative.   Respiratory: Negative.   Cardiovascular: Negative.   Gastrointestinal: Negative.   Musculoskeletal: Positive for arthralgias, myalgias, neck pain and neck stiffness.  Neurological: Positive for numbness (Bilateral upper extremities).     Objective: Vital Signs: BP 100/70 (BP Location: Left Arm, Patient Position: Sitting)   Pulse 62   Ht 5\' 8"  (1.727 m)   Wt 150 lb (68 kg)   BMI 22.81 kg/m   Physical Exam  Constitutional: He is oriented to person, place, and time. No distress.  HENT:  Head: Normocephalic and atraumatic.  Eyes: EOM are normal. Pupils are equal, round, and reactive to light.  Pulmonary/Chest: No respiratory distress.  Abdominal: He exhibits no distension.  Musculoskeletal:  Left greater than right brachial plexus tenderness. Positive Spurling test. Right shoulder he has limited active range of motion due to pain. Markedly positive impingement test. Negative drop arm. Pain with supraspinatus resistance. Left shoulder unremarkable. Bilateral elbows unremarkable. Negative Tinel's over the bilateral cubital tunnels. Bilateral wrist negative Tinel's and Phalen's. He has  trace bilateral triceps weakness.  Neurological: He is alert and oriented to person, place, and time.  Skin: Skin is warm and dry.    Ortho Exam  Specialty Comments:  No specialty comments available.  Imaging: No results found.   PMFS History: Patient Active Problem List   Diagnosis Date Noted  . Gross hematuria 04/01/2016  . Depression 04/01/2016  . Chronic low back pain 03/29/2016  . Cough 08/05/2015  . Encounter for well adult exam with abnormal findings  02/17/2015  . Lumbar disc disease   . Cervical disc disease   . GERD (gastroesophageal reflux disease)   . Anxiety   . Insomnia   . CONSTIPATION 10/05/2010  . MIGRAINE, COMMON 09/23/2010  . Abdominal pain, other specified site 09/23/2010   Past Medical History:  Diagnosis Date  . Anxiety   . Back injury   . Cervical disc disease   . Chronic back pain   . Chronic low back pain 03/29/2016  . GERD (gastroesophageal reflux disease)   . Insomnia   . Lumbar disc disease   . MIGRAINE, COMMON 09/23/2010   Qualifier: Diagnosis of  By: Jason Reichmann MD, Jason [name]   . Sickle cell trait [facility])     Family History  Problem Relation Age of Onset  . Congestive Heart Failure Father   . Hypertension Mother   . Hyperlipidemia Mother     No past surgical history on file. Social History   Occupational History  . Not on file.   Social History Main Topics  . Smoking status: Current Every Day Smoker    Packs/day: 1.00  . Smokeless tobacco: Never Used  . Alcohol use Yes     Comment: occaisionally  . Drug use: No  . Sexual activity: Not on file

## 2017-02-15 ENCOUNTER — Encounter (HOSPITAL_COMMUNITY): Payer: Self-pay

## 2017-02-15 DIAGNOSIS — R220 Localized swelling, mass and lump, head: Secondary | ICD-10-CM | POA: Diagnosis present

## 2017-02-15 DIAGNOSIS — Z5321 Procedure and treatment not carried out due to patient leaving prior to being seen by health care provider: Secondary | ICD-10-CM | POA: Insufficient documentation

## 2017-02-15 NOTE — ED Triage Notes (Signed)
Pt states he noticed a lump inside of right cheek a couple of days ago; pt states pain at 8/10 on arrival. Pt a&ox 4;

## 2017-02-16 ENCOUNTER — Emergency Department (HOSPITAL_COMMUNITY)
Admission: EM | Admit: 2017-02-16 | Discharge: 2017-02-16 | Disposition: A | Discharge status: Home / Self Care | Payer: No Typology Code available for payment source | Attending: Dermatology | Admitting: Dermatology

## 2017-02-16 ENCOUNTER — Ambulatory Visit (INDEPENDENT_AMBULATORY_CARE_PROVIDER_SITE_OTHER): Payer: PRIVATE HEALTH INSURANCE | Admitting: Specialist

## 2017-02-16 NOTE — ED Notes (Signed)
No answer in waiting room 

## 2017-02-20 ENCOUNTER — Ambulatory Visit (INDEPENDENT_AMBULATORY_CARE_PROVIDER_SITE_OTHER): Payer: Self-pay | Admitting: Specialist

## 2017-02-21 ENCOUNTER — Encounter (HOSPITAL_COMMUNITY): Payer: Self-pay | Admitting: Emergency Medicine

## 2017-02-21 ENCOUNTER — Ambulatory Visit (HOSPITAL_COMMUNITY)
Admission: EM | Admit: 2017-02-21 | Discharge: 2017-02-21 | Disposition: A | Discharge status: Home / Self Care | Payer: No Typology Code available for payment source | Attending: Internal Medicine | Admitting: Internal Medicine

## 2017-02-21 DIAGNOSIS — S50812A Abrasion of left forearm, initial encounter: Secondary | ICD-10-CM

## 2017-02-21 DIAGNOSIS — Z23 Encounter for immunization: Secondary | ICD-10-CM | POA: Diagnosis not present

## 2017-02-21 DIAGNOSIS — T07XXXA Unspecified multiple injuries, initial encounter: Secondary | ICD-10-CM

## 2017-02-21 MED ORDER — TETANUS-DIPHTH-ACELL PERTUSSIS 5-2.5-18.5 LF-MCG/0.5 IM SUSP
0.5000 mL | Freq: Once | INTRAMUSCULAR | Status: AC
  Administered 2017-02-21: 0.5 mL via INTRAMUSCULAR

## 2017-02-21 MED ORDER — TETANUS-DIPHTH-ACELL PERTUSSIS 5-2.5-18.5 LF-MCG/0.5 IM SUSP
INTRAMUSCULAR | Status: AC
  Filled 2017-02-21: qty 0.5
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
## 2017-02-21 NOTE — Discharge Instructions (Signed)
Return for wound check in 2 days.

## 2017-04-04 ENCOUNTER — Telehealth (INDEPENDENT_AMBULATORY_CARE_PROVIDER_SITE_OTHER): Payer: Self-pay | Admitting: Radiology

## 2017-04-04 NOTE — Telephone Encounter (Signed)
Can you check on this MRI for [place].  Looks like the scan was cancelled and I can't figure out why.  He needs to have the scan done.

## 2017-04-05 NOTE — Telephone Encounter (Signed)
Tried [employer] co closed at this time, will try later today.

## 2017-04-07 NOTE — Telephone Encounter (Signed)
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

## 2017-04-12 ENCOUNTER — Telehealth: Payer: Self-pay | Admitting: *Deleted

## 2017-04-12 NOTE — Telephone Encounter (Signed)
Pt was transferred to triage [facility] needing refills on his xanax & zolpidem. Pls call pt inform he has not seen MD since 03/2016 will need to make appt for refills. ...Jason [name]

## 2017-04-13 NOTE — Telephone Encounter (Signed)
Got patient scheduled for 7/5

## 2017-04-20 ENCOUNTER — Encounter: Payer: No Typology Code available for payment source | Admitting: Internal Medicine

## 2017-05-04 ENCOUNTER — Ambulatory Visit
Admission: RE | Admit: 2017-05-04 | Discharge: 2017-05-04 | Disposition: A | Discharge status: Home / Self Care | Payer: No Typology Code available for payment source | Source: Ambulatory Visit | Attending: Surgery | Admitting: Surgery

## 2017-05-04 DIAGNOSIS — M5412 Radiculopathy, cervical region: Secondary | ICD-10-CM

## 2017-05-17 ENCOUNTER — Ambulatory Visit (INDEPENDENT_AMBULATORY_CARE_PROVIDER_SITE_OTHER): Payer: PRIVATE HEALTH INSURANCE | Admitting: Specialist

## 2017-05-17 ENCOUNTER — Encounter (INDEPENDENT_AMBULATORY_CARE_PROVIDER_SITE_OTHER): Payer: Self-pay | Admitting: Specialist

## 2017-05-17 VITALS — BP 127/90 | HR 70 | Ht 68.0 in | Wt 150.0 lb

## 2017-05-17 DIAGNOSIS — M5441 Lumbago with sciatica, right side: Secondary | ICD-10-CM | POA: Diagnosis not present

## 2017-05-17 DIAGNOSIS — M542 Cervicalgia: Secondary | ICD-10-CM

## 2017-05-17 MED ORDER — ALPRAZOLAM 0.25 MG PO TABS: ORAL_TABLET | ORAL | 0 refills | Status: DC
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
## 2017-06-06 ENCOUNTER — Encounter: Payer: Self-pay | Admitting: Internal Medicine

## 2017-07-01 ENCOUNTER — Other Ambulatory Visit: Payer: Self-pay

## 2017-07-03 ENCOUNTER — Ambulatory Visit (INDEPENDENT_AMBULATORY_CARE_PROVIDER_SITE_OTHER): Payer: PRIVATE HEALTH INSURANCE | Admitting: Specialist

## 2017-07-08 ENCOUNTER — Ambulatory Visit
Admission: RE | Admit: 2017-07-08 | Discharge: 2017-07-08 | Disposition: A | Discharge status: Home / Self Care | Payer: No Typology Code available for payment source | Source: Ambulatory Visit | Attending: Specialist | Admitting: Specialist

## 2017-07-08 DIAGNOSIS — M542 Cervicalgia: Secondary | ICD-10-CM

## 2017-07-08 DIAGNOSIS — M5441 Lumbago with sciatica, right side: Secondary | ICD-10-CM

## 2017-08-03 ENCOUNTER — Ambulatory Visit (INDEPENDENT_AMBULATORY_CARE_PROVIDER_SITE_OTHER): Payer: PRIVATE HEALTH INSURANCE | Admitting: Specialist

## 2017-08-03 ENCOUNTER — Encounter (INDEPENDENT_AMBULATORY_CARE_PROVIDER_SITE_OTHER): Payer: Self-pay | Admitting: Specialist

## 2017-08-03 VITALS — BP 113/75 | HR 61 | Ht 68.0 in | Wt 150.0 lb

## 2017-08-03 DIAGNOSIS — M5136 Other intervertebral disc degeneration, lumbar region: Secondary | ICD-10-CM | POA: Diagnosis not present

## 2017-08-03 DIAGNOSIS — M519 Unspecified thoracic, thoracolumbar and lumbosacral intervertebral disc disorder: Secondary | ICD-10-CM

## 2017-08-03 DIAGNOSIS — M5116 Intervertebral disc disorders with radiculopathy, lumbar region: Secondary | ICD-10-CM | POA: Diagnosis not present

## 2017-08-03 DIAGNOSIS — F1120 Opioid dependence, uncomplicated: Secondary | ICD-10-CM

## 2017-08-03 DIAGNOSIS — M5090 Cervical disc disorder, unspecified, unspecified cervical region: Secondary | ICD-10-CM | POA: Diagnosis not present

## 2017-08-03 NOTE — Patient Instructions (Signed)
Plan: Avoid frequent bending and stooping  No lifting greater than 10 lbs. May use ice or moist heat for pain. Weight loss is of benefit. Handicap license is approved.  Avoid bending, stooping and avoid lifting weights greater than 10 lbs. Avoid prolong standing and walking. Order for a new walker with wheels. Surgery scheduling secretary Kandice Hams, will call you in the next week to schedule for surgery.  Surgery recommended is a one level lumbar bilateral partial hemilaminectomies L4-5 using microscope. Take oxycodocodone for for pain. Risk of surgery includes risk of infection 1 in 300 patients, bleeding 1/2% chance you would need a transfusion.   Risk to the nerves is one in 10,000. You will need to use a brace for 3 months and wean from the brace on the 4th month. Expect improved walking and standing tolerance. Expect relief of leg pain but numbness may persist depending on the length and degree of pressure that has been present.

## 2017-08-03 NOTE — Progress Notes (Addendum)
Office Visit Note   Patient: Jason [name]           Date of Birth: Nov 24, 1980           MRN: [id number] Visit Date: 08/03/2017              Requested by: Biagio Borg, MD [address] [place], [place] [address] PCP: Biagio Borg, MD   Assessment & Plan: Visit Diagnoses:  1. Herniation of lumbar intervertebral disc with radiculopathy   2. Degenerative disc disease, lumbar   36 year old male with long history of oxycodone use, he has been experiencing worsening back buttock and bilateral leg pain and foot numbness. Clinically neurotension signs are positive bilaterally with no focal motor deficient. MRI with worsening of a central disc protrusion with increased subarticular compression of the tranversing L5 nerve roots at the L4-5 level. No modic changes also protrusion centrally at L5-S1 that is noncompressive. He take oxycodone 30 mg every 4 hours from a pain management program [facility] and Dr. Mirna Mires. I recommend bilateral partial  Hemilaminectomies at the L4-5 level with microdiscectomies. Expected improved standing and walking tolerance and relief of leg symptoms, however I am not optimistic about his capacity to stop narcotics due to the Length of time he has been taking them.   Plan: Avoid frequent bending and stooping  No lifting greater than 10 lbs. May use ice or moist heat for pain. Weight loss is of benefit. Handicap license is approved.  Avoid bending, stooping and avoid lifting weights greater than 10 lbs. Avoid prolong standing and walking. Order for a new walker with wheels. Surgery scheduling secretary Kandice Hams, will call you in the next week to schedule for surgery.  Surgery recommended is a one level lumbar bilateral partial hemilaminectomies L4-5 using microscope. Take oxycodocodone for for pain. Risk of surgery includes risk of infection 1 in 300 patients, bleeding 1/2% chance you would need a transfusion.   Risk to the nerves is  one in 10,000. You will need to use a brace for 3 months and wean from the brace on the 4th month. Expect improved walking and standing tolerance. Expect relief of leg pain but numbness may persist depending on the length and degree of pressure that has been present.   Follow-Up Instructions: Return in about 4 weeks (around 08/31/2017) for post op from lumbar laminectomy.   Orders:  No orders of the defined types were placed in this encounter.  No orders of the defined types were placed in this encounter.     Procedures: No procedures performed   Clinical Data: Findings:  MRI done 07/08/2017 shows more prominent central disc protrusion L4-5 with subarticular narrowing that may affect the traversing L5 nerve roots. Central protusion L5-S1 is less prominent and Non compressive. MRI of the cervical spine from 04/2017 with spondylosis changes causing mild foramenal stenosis changes C5-6 and C6-7.     Subjective: Chief Complaint  Patient presents with  . Lower Back - Follow-up    MRI Review     36 year old male with persisting severe back pain taking 30 mg of oxycodone every 4 hours from Dr. Mirna Mires with [name], previously [facility]. Has history of opiod use for over 8 years. He feels pressure on his spine. This pressure has been worsening to where he is having continuous pain, pain with any bending or stooping or sitting.  Pain radiates into both legs the whole legs. The pain  worsens with standing and walking and he has difficulty straightening up. No bowel or bladder difficulty. Can not walk more than 150 feet, Leans on carts to relieve pain in the legs. He has been in a pain management program for a considerable time and a long history of oxycodone use, history of opiod withdrawal.     Review of Systems  Constitutional: Negative.   HENT: Negative.   Eyes: Negative.   Respiratory: Negative.   Cardiovascular: Negative.   Gastrointestinal:  Negative.   Endocrine: Negative.   Genitourinary: Negative.   Musculoskeletal: Negative.   Skin: Negative.   Allergic/Immunologic: Negative.   Neurological: Negative.   Hematological: Negative.   Psychiatric/Behavioral: Negative.      Objective: Vital Signs: BP 113/75 (BP Location: Left Arm, Patient Position: Sitting)   Pulse 61   Ht 5\' 8"  (1.727 m)   Wt 150 lb (68 kg)   BMI 22.81 kg/m   Physical Exam  Constitutional: He is oriented to person, place, and time. He appears well-developed and well-nourished.  HENT:  Head: Normocephalic and atraumatic.  Eyes: Pupils are equal, round, and reactive to light. EOM are normal.  Neck: Normal range of motion. Neck supple.  Pulmonary/Chest: Effort normal and breath sounds normal.  Abdominal: Soft. Bowel sounds are normal.  Neurological: He is alert and oriented to person, place, and time.  Skin: Skin is warm and dry.  Psychiatric: He has a normal mood and affect. His behavior is normal. Judgment and thought content normal.    Back Exam   Tenderness  The patient is experiencing tenderness in the lumbar.  Range of Motion  Extension: normal  Flexion: abnormal  Lateral Bend Right: normal  Lateral Bend Left: normal  Rotation Right: normal  Rotation Left: normal   Muscle Strength  Right Quadriceps:  5/5  Left Quadriceps:  5/5  Right Hamstrings:  5/5  Left Hamstrings:  5/5   Tests  Straight leg raise right: positive Straight leg raise left: positive  Reflexes  Patellar:  2/4 normal Achilles: 2/4 Babinski's sign: normal   Other  Toe Walk: normal Heel Walk: normal Gait: normal  Erythema: no back redness Scars: absent      Specialty Comments:  Patient needs to verify address.  Imaging: No results found.   PMFS History: Patient Active Problem List   Diagnosis Date Noted  . Gross hematuria 04/01/2016  . Depression 04/01/2016  . Chronic low back pain 03/29/2016  . Cough 08/05/2015  . Encounter for well  adult exam with abnormal findings 02/17/2015  . Lumbar disc disease   . Cervical disc disease   . GERD (gastroesophageal reflux disease)   . Anxiety   . Insomnia   . CONSTIPATION 10/05/2010  . MIGRAINE, COMMON 09/23/2010  . Abdominal pain, other specified site 09/23/2010   Past Medical History:  Diagnosis Date  . Anxiety   . Back injury   . Cervical disc disease   . Chronic back pain   . Chronic low back pain 03/29/2016  . GERD (gastroesophageal reflux disease)   . Insomnia   . Lumbar disc disease   . MIGRAINE, COMMON 09/23/2010   Qualifier: Diagnosis of  By: Jenny Reichmann MD, Hunt Oris   . Sickle cell trait [facility])     Family History  Problem Relation Age of Onset  . Congestive Heart Failure Father   . Hypertension Mother   . Hyperlipidemia Mother     No past surgical history on file. Social History   Occupational History  .  Not on file.   Social History Main Topics  . Smoking status: Current Every Day Smoker    Packs/day: 1.00  . Smokeless tobacco: Never Used  . Alcohol use Yes     Comment: occaisionally  . Drug use: No  . Sexual activity: Not on file

## 2017-08-25 ENCOUNTER — Encounter (HOSPITAL_COMMUNITY): Payer: Self-pay | Admitting: *Deleted

## 2017-08-25 NOTE — Progress Notes (Signed)
Spoke with pt for pre-op call. Pt denies cardiac history, chest pain, sob or diabetes. 

## 2017-08-28 ENCOUNTER — Encounter (HOSPITAL_COMMUNITY): Payer: Self-pay | Admitting: General Practice

## 2017-08-28 ENCOUNTER — Ambulatory Visit (HOSPITAL_COMMUNITY): Payer: No Typology Code available for payment source | Admitting: Anesthesiology

## 2017-08-28 ENCOUNTER — Ambulatory Visit (HOSPITAL_COMMUNITY): Payer: No Typology Code available for payment source

## 2017-08-28 ENCOUNTER — Encounter (HOSPITAL_COMMUNITY)
Admission: RE | Disposition: A | Discharge status: Home Health | Payer: Self-pay | Source: Ambulatory Visit | Attending: Specialist

## 2017-08-28 ENCOUNTER — Inpatient Hospital Stay (HOSPITAL_COMMUNITY)
Admission: RE | Admit: 2017-08-28 | Discharge: 2017-08-31 | DRG: 520 | Disposition: A | Discharge status: Home Health | Payer: No Typology Code available for payment source | Source: Ambulatory Visit | Attending: Specialist | Admitting: Specialist

## 2017-08-28 DIAGNOSIS — Z8249 Family history of ischemic heart disease and other diseases of the circulatory system: Secondary | ICD-10-CM

## 2017-08-28 DIAGNOSIS — Z9889 Other specified postprocedural states: Secondary | ICD-10-CM

## 2017-08-28 DIAGNOSIS — M48062 Spinal stenosis, lumbar region with neurogenic claudication: Secondary | ICD-10-CM

## 2017-08-28 DIAGNOSIS — M5116 Intervertebral disc disorders with radiculopathy, lumbar region: Secondary | ICD-10-CM | POA: Diagnosis present

## 2017-08-28 DIAGNOSIS — M48061 Spinal stenosis, lumbar region without neurogenic claudication: Secondary | ICD-10-CM | POA: Diagnosis present

## 2017-08-28 DIAGNOSIS — M5126 Other intervertebral disc displacement, lumbar region: Principal | ICD-10-CM | POA: Diagnosis present

## 2017-08-28 DIAGNOSIS — G8929 Other chronic pain: Secondary | ICD-10-CM | POA: Diagnosis present

## 2017-08-28 DIAGNOSIS — G4700 Insomnia, unspecified: Secondary | ICD-10-CM | POA: Diagnosis present

## 2017-08-28 DIAGNOSIS — Z419 Encounter for procedure for purposes other than remedying health state, unspecified: Secondary | ICD-10-CM

## 2017-08-28 DIAGNOSIS — Z881 Allergy status to other antibiotic agents status: Secondary | ICD-10-CM

## 2017-08-28 DIAGNOSIS — F17210 Nicotine dependence, cigarettes, uncomplicated: Secondary | ICD-10-CM | POA: Diagnosis present

## 2017-08-28 DIAGNOSIS — F419 Anxiety disorder, unspecified: Secondary | ICD-10-CM | POA: Diagnosis present

## 2017-08-28 DIAGNOSIS — D573 Sickle-cell trait: Secondary | ICD-10-CM | POA: Diagnosis present

## 2017-08-28 DIAGNOSIS — Z79899 Other long term (current) drug therapy: Secondary | ICD-10-CM

## 2017-08-28 DIAGNOSIS — Z79891 Long term (current) use of opiate analgesic: Secondary | ICD-10-CM

## 2017-08-28 DIAGNOSIS — Z8349 Family history of other endocrine, nutritional and metabolic diseases: Secondary | ICD-10-CM

## 2017-08-28 DIAGNOSIS — K219 Gastro-esophageal reflux disease without esophagitis: Secondary | ICD-10-CM | POA: Diagnosis present

## 2017-08-28 HISTORY — PX: LUMBAR LAMINECTOMY/DECOMPRESSION MICRODISCECTOMY: SHX5026

## 2017-08-28 HISTORY — DX: Pneumonia, unspecified organism: J18.9

## 2017-08-28 LAB — CBC
HEMATOCRIT: 41.7 % (ref 39.0–52.0)
Hemoglobin: 14 g/dL (ref 13.0–17.0)
MCH: 27.1 pg (ref 26.0–34.0)
MCHC: 33.6 g/dL (ref 30.0–36.0)
MCV: 80.8 fL (ref 78.0–100.0)
PLATELETS: 258 10*3/uL (ref 150–400)
RBC: 5.16 MIL/uL (ref 4.22–5.81)
RDW: 15 % (ref 11.5–15.5)
WBC: 7.1 10*3/uL (ref 4.0–10.5)

## 2017-08-28 LAB — COMPREHENSIVE METABOLIC PANEL
ALT: 16 U/L — AB (ref 17–63)
AST: 21 U/L (ref 15–41)
Albumin: 3.9 g/dL (ref 3.5–5.0)
Alkaline Phosphatase: 47 U/L (ref 38–126)
Anion gap: 7 (ref 5–15)
BILIRUBIN TOTAL: 0.5 mg/dL (ref 0.3–1.2)
BUN: 5 mg/dL — ABNORMAL LOW (ref 6–20)
CALCIUM: 9.6 mg/dL (ref 8.9–10.3)
CHLORIDE: 100 mmol/L — AB (ref 101–111)
CO2: 31 mmol/L (ref 22–32)
CREATININE: 0.96 mg/dL (ref 0.61–1.24)
Glucose, Bld: 84 mg/dL (ref 65–99)
Potassium: 4.7 mmol/L (ref 3.5–5.1)
Sodium: 138 mmol/L (ref 135–145)
TOTAL PROTEIN: 6.7 g/dL (ref 6.5–8.1)

## 2017-08-28 LAB — PROTIME-INR
INR: 0.96
PROTHROMBIN TIME: 12.7 s (ref 11.4–15.2)

## 2017-08-28 LAB — APTT: aPTT: 29 seconds (ref 24–36)

## 2017-08-28 LAB — SURGICAL PCR SCREEN
MRSA, PCR: NEGATIVE
STAPHYLOCOCCUS AUREUS: NEGATIVE

## 2017-08-28 SURGERY — LUMBAR LAMINECTOMY/DECOMPRESSION MICRODISCECTOMY
Anesthesia: General | Site: Back | Laterality: Bilateral

## 2017-08-28 MED ORDER — SODIUM CHLORIDE 0.9 % IV SOLN
INTRAVENOUS | Status: DC
  Administered 2017-08-28: 16:00:00 via INTRAVENOUS
  Administered 2017-08-29: 75 mL via INTRAVENOUS

## 2017-08-28 MED ORDER — ONDANSETRON HCL 4 MG/2ML IJ SOLN: 4.0000 mg | Freq: Four times a day (QID) | INTRAMUSCULAR | Status: DC | PRN

## 2017-08-28 MED ORDER — OXYCODONE HCL ER 20 MG PO T12A
20.0000 mg | EXTENDED_RELEASE_TABLET | Freq: Two times a day (BID) | ORAL | Status: DC
  Administered 2017-08-28 – 2017-08-31 (×7): 20 mg via ORAL
  Filled 2017-08-28 (×7): qty 1

## 2017-08-28 MED ORDER — BISACODYL 5 MG PO TBEC: 5.0000 mg | DELAYED_RELEASE_TABLET | Freq: Every day | ORAL | Status: DC | PRN

## 2017-08-28 MED ORDER — HEMOSTATIC AGENTS (NO CHARGE) OPTIME
TOPICAL | Status: DC | PRN
  Administered 2017-08-28: 1 via TOPICAL

## 2017-08-28 MED ORDER — ONDANSETRON HCL 4 MG PO TABS: 4.0000 mg | ORAL_TABLET | Freq: Four times a day (QID) | ORAL | Status: DC | PRN

## 2017-08-28 MED ORDER — DEXAMETHASONE SODIUM PHOSPHATE 10 MG/ML IJ SOLN
INTRAMUSCULAR | Status: AC
  Filled 2017-08-28: qty 1

## 2017-08-28 MED ORDER — CEFAZOLIN SODIUM-DEXTROSE 2-4 GM/100ML-% IV SOLN
2.0000 g | Freq: Three times a day (TID) | INTRAVENOUS | Status: AC
  Administered 2017-08-28 – 2017-08-29 (×2): 2 g via INTRAVENOUS
  Filled 2017-08-28 (×2): qty 100

## 2017-08-28 MED ORDER — ONDANSETRON HCL 4 MG/2ML IJ SOLN
INTRAMUSCULAR | Status: AC
  Filled 2017-08-28: qty 2

## 2017-08-28 MED ORDER — ACETAMINOPHEN 650 MG RE SUPP: 650.0000 mg | RECTAL | Status: DC | PRN

## 2017-08-28 MED ORDER — OXYCODONE HCL 5 MG PO TABS
30.0000 mg | ORAL_TABLET | Freq: Every day | ORAL | Status: DC
  Administered 2017-08-28 – 2017-08-31 (×19): 30 mg via ORAL
  Filled 2017-08-28 (×18): qty 6

## 2017-08-28 MED ORDER — DEXAMETHASONE SODIUM PHOSPHATE 10 MG/ML IJ SOLN
INTRAMUSCULAR | Status: DC | PRN
  Administered 2017-08-28: 10 mg via INTRAVENOUS

## 2017-08-28 MED ORDER — MENTHOL 3 MG MT LOZG: 1.0000 | LOZENGE | OROMUCOSAL | Status: DC | PRN

## 2017-08-28 MED ORDER — PROPOFOL 10 MG/ML IV BOLUS
INTRAVENOUS | Status: DC | PRN
Start: 2017-08-28 — End: 2017-08-28
  Administered 2017-08-28: 180 mg via INTRAVENOUS

## 2017-08-28 MED ORDER — SUGAMMADEX SODIUM 200 MG/2ML IV SOLN
INTRAVENOUS | Status: AC
  Filled 2017-08-28: qty 2

## 2017-08-28 MED ORDER — ACETAMINOPHEN 325 MG PO TABS
ORAL_TABLET | ORAL | Status: AC
  Administered 2017-08-28: 650 mg via ORAL
  Filled 2017-08-28: qty 2

## 2017-08-28 MED ORDER — MIDAZOLAM HCL 5 MG/5ML IJ SOLN
INTRAMUSCULAR | Status: DC | PRN
  Administered 2017-08-28: 2 mg via INTRAVENOUS

## 2017-08-28 MED ORDER — GABAPENTIN 300 MG PO CAPS
300.0000 mg | ORAL_CAPSULE | Freq: Three times a day (TID) | ORAL | Status: DC
  Administered 2017-08-28 – 2017-08-31 (×9): 300 mg via ORAL
  Filled 2017-08-28 (×9): qty 1

## 2017-08-28 MED ORDER — FENTANYL CITRATE (PF) 100 MCG/2ML IJ SOLN
25.0000 ug | INTRAMUSCULAR | Status: DC | PRN
  Administered 2017-08-28 (×2): 50 ug via INTRAVENOUS

## 2017-08-28 MED ORDER — FENTANYL CITRATE (PF) 250 MCG/5ML IJ SOLN
INTRAMUSCULAR | Status: AC
  Filled 2017-08-28: qty 5

## 2017-08-28 MED ORDER — BUPIVACAINE HCL (PF) 0.5 % IJ SOLN
INTRAMUSCULAR | Status: AC
  Filled 2017-08-28: qty 30

## 2017-08-28 MED ORDER — LIDOCAINE 2% (20 MG/ML) 5 ML SYRINGE
INTRAMUSCULAR | Status: AC
  Filled 2017-08-28: qty 5

## 2017-08-28 MED ORDER — PHENYLEPHRINE 40 MCG/ML (10ML) SYRINGE FOR IV PUSH (FOR BLOOD PRESSURE SUPPORT)
PREFILLED_SYRINGE | INTRAVENOUS | Status: AC
  Filled 2017-08-28: qty 10

## 2017-08-28 MED ORDER — FENTANYL CITRATE (PF) 100 MCG/2ML IJ SOLN
INTRAMUSCULAR | Status: AC
  Administered 2017-08-28: 50 ug via INTRAVENOUS
  Filled 2017-08-28: qty 2

## 2017-08-28 MED ORDER — SUCCINYLCHOLINE CHLORIDE 200 MG/10ML IV SOSY
PREFILLED_SYRINGE | INTRAVENOUS | Status: AC
  Filled 2017-08-28: qty 10

## 2017-08-28 MED ORDER — ARTIFICIAL TEARS OPHTHALMIC OINT
TOPICAL_OINTMENT | OPHTHALMIC | Status: DC | PRN
  Administered 2017-08-28: 1 via OPHTHALMIC

## 2017-08-28 MED ORDER — EPHEDRINE SULFATE-NACL 50-0.9 MG/10ML-% IV SOSY
PREFILLED_SYRINGE | INTRAVENOUS | Status: DC | PRN
  Administered 2017-08-28 (×2): 5 mg via INTRAVENOUS

## 2017-08-28 MED ORDER — OXYCODONE HCL 5 MG PO TABS
5.0000 mg | ORAL_TABLET | ORAL | Status: DC | PRN
  Administered 2017-08-28 – 2017-08-31 (×6): 5 mg via ORAL
  Filled 2017-08-28 (×6): qty 1

## 2017-08-28 MED ORDER — ROCURONIUM BROMIDE 100 MG/10ML IV SOLN
INTRAVENOUS | Status: DC | PRN
  Administered 2017-08-28: 50 mg via INTRAVENOUS

## 2017-08-28 MED ORDER — SODIUM CHLORIDE 0.9% FLUSH
3.0000 mL | Freq: Two times a day (BID) | INTRAVENOUS | Status: DC
  Administered 2017-08-28 – 2017-08-31 (×6): 3 mL via INTRAVENOUS

## 2017-08-28 MED ORDER — LIDOCAINE HCL (CARDIAC) 20 MG/ML IV SOLN
INTRAVENOUS | Status: DC | PRN
  Administered 2017-08-28: 20 mg via INTRAVENOUS

## 2017-08-28 MED ORDER — PHENOL 1.4 % MT LIQD: 1.0000 | OROMUCOSAL | Status: DC | PRN

## 2017-08-28 MED ORDER — SODIUM CHLORIDE 0.9% FLUSH
3.0000 mL | INTRAVENOUS | Status: DC | PRN
  Administered 2017-08-30: 3 mL via INTRAVENOUS
  Filled 2017-08-28: qty 3

## 2017-08-28 MED ORDER — CEFAZOLIN SODIUM-DEXTROSE 2-4 GM/100ML-% IV SOLN
2.0000 g | INTRAVENOUS | Status: AC
  Administered 2017-08-28: 2 g via INTRAVENOUS

## 2017-08-28 MED ORDER — MORPHINE SULFATE (PF) 4 MG/ML IV SOLN
2.0000 mg | INTRAVENOUS | Status: DC | PRN
  Administered 2017-08-28 – 2017-08-31 (×7): 2 mg via INTRAVENOUS
  Filled 2017-08-28 (×8): qty 1

## 2017-08-28 MED ORDER — MIDAZOLAM HCL 2 MG/2ML IJ SOLN
INTRAMUSCULAR | Status: AC
  Filled 2017-08-28: qty 2

## 2017-08-28 MED ORDER — BUPIVACAINE LIPOSOME 1.3 % IJ SUSP
20.0000 mL | INTRAMUSCULAR | Status: AC
  Administered 2017-08-28: 15 mL
  Filled 2017-08-28: qty 20

## 2017-08-28 MED ORDER — THROMBIN 20000 UNITS EX KIT
PACK | CUTANEOUS | Status: AC
  Filled 2017-08-28: qty 1

## 2017-08-28 MED ORDER — THROMBIN (RECOMBINANT) 5000 UNITS EX SOLR
CUTANEOUS | Status: DC | PRN
  Administered 2017-08-28 (×4): 5000 [IU] via TOPICAL

## 2017-08-28 MED ORDER — ROCURONIUM BROMIDE 10 MG/ML (PF) SYRINGE
PREFILLED_SYRINGE | INTRAVENOUS | Status: AC
  Filled 2017-08-28: qty 5

## 2017-08-28 MED ORDER — METHOCARBAMOL 1000 MG/10ML IJ SOLN
500.0000 mg | Freq: Four times a day (QID) | INTRAVENOUS | Status: DC | PRN
  Filled 2017-08-28: qty 5

## 2017-08-28 MED ORDER — KETOROLAC TROMETHAMINE 15 MG/ML IJ SOLN
15.0000 mg | Freq: Four times a day (QID) | INTRAMUSCULAR | Status: AC
  Administered 2017-08-28 – 2017-08-29 (×4): 15 mg via INTRAVENOUS
  Filled 2017-08-28 (×4): qty 1

## 2017-08-28 MED ORDER — FENTANYL CITRATE (PF) 100 MCG/2ML IJ SOLN
INTRAMUSCULAR | Status: DC | PRN
  Administered 2017-08-28 (×3): 50 ug via INTRAVENOUS
  Administered 2017-08-28: 25 ug via INTRAVENOUS
  Administered 2017-08-28: 75 ug via INTRAVENOUS

## 2017-08-28 MED ORDER — METHOCARBAMOL 500 MG PO TABS
ORAL_TABLET | ORAL | Status: AC
  Administered 2017-08-28: 500 mg via ORAL
  Filled 2017-08-28: qty 1

## 2017-08-28 MED ORDER — ALPRAZOLAM 0.25 MG PO TABS
0.2500 mg | ORAL_TABLET | Freq: Three times a day (TID) | ORAL | Status: DC
  Administered 2017-08-28 – 2017-08-31 (×9): 0.25 mg via ORAL
  Filled 2017-08-28 (×9): qty 1

## 2017-08-28 MED ORDER — METHOCARBAMOL 500 MG PO TABS
500.0000 mg | ORAL_TABLET | Freq: Four times a day (QID) | ORAL | Status: DC | PRN
  Administered 2017-08-28 – 2017-08-31 (×7): 500 mg via ORAL
  Filled 2017-08-28 (×6): qty 1

## 2017-08-28 MED ORDER — THROMBIN (RECOMBINANT) 5000 UNITS EX SOLR
CUTANEOUS | Status: AC
  Filled 2017-08-28: qty 20000

## 2017-08-28 MED ORDER — CHLORHEXIDINE GLUCONATE 4 % EX LIQD: 60.0000 mL | Freq: Once | CUTANEOUS | Status: DC

## 2017-08-28 MED ORDER — OXYCODONE HCL 5 MG PO TABS: 5.0000 mg | ORAL_TABLET | Freq: Once | ORAL | Status: DC | PRN

## 2017-08-28 MED ORDER — DOCUSATE SODIUM 100 MG PO CAPS
100.0000 mg | ORAL_CAPSULE | Freq: Two times a day (BID) | ORAL | Status: DC
  Administered 2017-08-28 – 2017-08-31 (×7): 100 mg via ORAL
  Filled 2017-08-28 (×7): qty 1

## 2017-08-28 MED ORDER — POLYETHYLENE GLYCOL 3350 17 G PO PACK
17.0000 g | PACK | Freq: Every day | ORAL | Status: DC | PRN
  Administered 2017-08-31: 17 g via ORAL
  Filled 2017-08-28: qty 1

## 2017-08-28 MED ORDER — SUGAMMADEX SODIUM 200 MG/2ML IV SOLN
INTRAVENOUS | Status: DC | PRN
  Administered 2017-08-28: 136 mg via INTRAVENOUS

## 2017-08-28 MED ORDER — CEFAZOLIN SODIUM-DEXTROSE 2-4 GM/100ML-% IV SOLN
INTRAVENOUS | Status: AC
Start: 2017-08-28 — End: 2017-08-28
  Filled 2017-08-28: qty 100

## 2017-08-28 MED ORDER — 0.9 % SODIUM CHLORIDE (POUR BTL) OPTIME
TOPICAL | Status: DC | PRN
  Administered 2017-08-28: 1000 mL

## 2017-08-28 MED ORDER — EPHEDRINE 5 MG/ML INJ
INTRAVENOUS | Status: AC
Start: 2017-08-28 — End: 2017-08-28
  Filled 2017-08-28: qty 10

## 2017-08-28 MED ORDER — DIPHENHYDRAMINE HCL 25 MG PO TABS
25.0000 mg | ORAL_TABLET | Freq: Every day | ORAL | Status: DC
  Administered 2017-08-28 – 2017-08-29 (×2): 25 mg via ORAL
  Filled 2017-08-28 (×5): qty 1

## 2017-08-28 MED ORDER — PROPOFOL 10 MG/ML IV BOLUS
INTRAVENOUS | Status: AC
  Filled 2017-08-28: qty 40

## 2017-08-28 MED ORDER — BUPIVACAINE HCL 0.5 % IJ SOLN
INTRAMUSCULAR | Status: DC | PRN
  Administered 2017-08-28: 15 mL

## 2017-08-28 MED ORDER — OXYCODONE HCL 5 MG/5ML PO SOLN
5.0000 mg | Freq: Once | ORAL | Status: DC | PRN
Start: 2017-08-28 — End: 2017-08-28

## 2017-08-28 MED ORDER — POLYVINYL ALCOHOL 1.4 % OP SOLN: 1.0000 [drp] | Freq: Three times a day (TID) | OPHTHALMIC | Status: DC | PRN

## 2017-08-28 MED ORDER — LACTATED RINGERS IV SOLN
INTRAVENOUS | Status: DC | PRN
  Administered 2017-08-28 (×2): via INTRAVENOUS

## 2017-08-28 MED ORDER — ALUM & MAG HYDROXIDE-SIMETH 200-200-20 MG/5ML PO SUSP: 30.0000 mL | Freq: Four times a day (QID) | ORAL | Status: DC | PRN

## 2017-08-28 MED ORDER — FLEET ENEMA 7-19 GM/118ML RE ENEM: 1.0000 | ENEMA | Freq: Once | RECTAL | Status: DC | PRN

## 2017-08-28 MED ORDER — ACETAMINOPHEN 325 MG PO TABS
650.0000 mg | ORAL_TABLET | ORAL | Status: DC | PRN
  Administered 2017-08-28: 650 mg via ORAL

## 2017-08-28 MED ORDER — OXYCODONE HCL 5 MG PO TABS
ORAL_TABLET | ORAL | Status: AC
  Administered 2017-08-28: 30 mg via ORAL
  Filled 2017-08-28: qty 6

## 2017-08-28 MED ORDER — ONDANSETRON HCL 4 MG/2ML IJ SOLN: 4.0000 mg | Freq: Once | INTRAMUSCULAR | Status: DC | PRN

## 2017-08-28 MED ORDER — ONDANSETRON HCL 4 MG/2ML IJ SOLN
INTRAMUSCULAR | Status: DC | PRN
  Administered 2017-08-28: 4 mg via INTRAVENOUS

## 2017-08-28 SURGICAL SUPPLY — 52 items
BENZOIN TINCTURE PRP APPL 2/3 (GAUZE/BANDAGES/DRESSINGS) ×3 IMPLANT
BUR SABER RD CUTTING 3.0 (BURR) ×2 IMPLANT
BUR SABER RD CUTTING 3.0MM (BURR) ×1
CANISTER SUCT 3000ML PPV (MISCELLANEOUS) ×3 IMPLANT
CLOSURE STERI-STRIP 1/2X4 (GAUZE/BANDAGES/DRESSINGS) ×1
CLSR STERI-STRIP ANTIMIC 1/2X4 (GAUZE/BANDAGES/DRESSINGS) ×2 IMPLANT
COVER SURGICAL LIGHT HANDLE (MISCELLANEOUS) ×3 IMPLANT
DERMABOND ADVANCED (GAUZE/BANDAGES/DRESSINGS) ×2
DERMABOND ADVANCED .7 DNX12 (GAUZE/BANDAGES/DRESSINGS) ×1 IMPLANT
DRAPE HALF SHEET 40X57 (DRAPES) ×3 IMPLANT
DRAPE INCISE IOBAN 66X45 STRL (DRAPES) IMPLANT
DRAPE MICROSCOPE LEICA (MISCELLANEOUS) ×3 IMPLANT
DRAPE SURG 17X23 STRL (DRAPES) ×12 IMPLANT
DRSG MEPILEX BORDER 4X4 (GAUZE/BANDAGES/DRESSINGS) ×3 IMPLANT
DRSG MEPILEX BORDER 4X8 (GAUZE/BANDAGES/DRESSINGS) IMPLANT
DURAPREP 26ML APPLICATOR (WOUND CARE) ×3 IMPLANT
ELECT REM PT RETURN 9FT ADLT (ELECTROSURGICAL) ×3
ELECTRODE REM PT RTRN 9FT ADLT (ELECTROSURGICAL) ×1 IMPLANT
EVACUATOR 1/8 PVC DRAIN (DRAIN) IMPLANT
GLOVE BIOGEL PI IND STRL 8 (GLOVE) ×1 IMPLANT
GLOVE BIOGEL PI INDICATOR 8 (GLOVE) ×2
GLOVE ECLIPSE 9.0 STRL (GLOVE) ×3 IMPLANT
GLOVE ORTHO TXT STRL SZ7.5 (GLOVE) ×3 IMPLANT
GLOVE SURG 8.5 LATEX PF (GLOVE) ×3 IMPLANT
GOWN STRL REUS W/ TWL LRG LVL3 (GOWN DISPOSABLE) ×1 IMPLANT
GOWN STRL REUS W/TWL 2XL LVL3 (GOWN DISPOSABLE) ×6 IMPLANT
GOWN STRL REUS W/TWL LRG LVL3 (GOWN DISPOSABLE) ×2
KIT BASIN OR (CUSTOM PROCEDURE TRAY) ×3 IMPLANT
KIT ROOM TURNOVER OR (KITS) ×3 IMPLANT
NEEDLE SPNL 18GX3.5 QUINCKE PK (NEEDLE) ×6 IMPLANT
NS IRRIG 1000ML POUR BTL (IV SOLUTION) ×3 IMPLANT
PACK LAMINECTOMY ORTHO (CUSTOM PROCEDURE TRAY) ×3 IMPLANT
PAD ARMBOARD 7.5X6 YLW CONV (MISCELLANEOUS) ×6 IMPLANT
PATTIES SURGICAL .5 X.5 (GAUZE/BANDAGES/DRESSINGS) IMPLANT
PATTIES SURGICAL .75X.75 (GAUZE/BANDAGES/DRESSINGS) IMPLANT
PATTIES SURGICAL 1X1 (DISPOSABLE) IMPLANT
SLEEVE SURGEON STRL (DRAPES) ×3 IMPLANT
SPONGE LAP 4X18 X RAY DECT (DISPOSABLE) IMPLANT
SPONGE SURGIFOAM ABS GEL 100 (HEMOSTASIS) ×3 IMPLANT
SUT VIC AB 0 CT1 27 (SUTURE)
SUT VIC AB 0 CT1 27XBRD ANBCTR (SUTURE) IMPLANT
SUT VIC AB 1 CT1 27 (SUTURE) ×2
SUT VIC AB 1 CT1 27XBRD ANBCTR (SUTURE) ×1 IMPLANT
SUT VIC AB 2-0 CT1 27 (SUTURE) ×2
SUT VIC AB 2-0 CT1 TAPERPNT 27 (SUTURE) ×1 IMPLANT
SUT VIC AB 3-0 X1 27 (SUTURE) ×3 IMPLANT
SUT VICRYL 0 UR6 27IN ABS (SUTURE) ×3 IMPLANT
SYRINGE 20CC LL (MISCELLANEOUS) ×3 IMPLANT
TOWEL OR 17X24 6PK STRL BLUE (TOWEL DISPOSABLE) ×3 IMPLANT
TOWEL OR 17X26 10 PK STRL BLUE (TOWEL DISPOSABLE) ×3 IMPLANT
TRAY FOLEY W/METER SILVER 16FR (SET/KITS/TRAYS/PACK) IMPLANT
WATER STERILE IRR 1000ML POUR (IV SOLUTION) ×3 IMPLANT

## 2017-08-28 NOTE — Interval H&P Note (Signed)
History and Physical Interval Note:  08/28/2017 7:29 AM  Jason [name]  has presented today for surgery, with the diagnosis of central disc herniation L4-5  The various methods of treatment have been discussed with the patient and family. After consideration of risks, benefits and other options for treatment, the patient has consented to  Procedure(s): BILATERAL PARTIAL HEMILAMINECTOMIES L4-5 WITH MICRODISCECTOMY (N/A) as a surgical intervention .  [name] history has been reviewed, patient examined, no change in status, stable for surgery.  I have reviewed the patient's chart and labs.  Questions were answered to the patient's satisfaction.     [name]

## 2017-08-28 NOTE — H&P (Signed)
Jason [name] is an 36 y.o. male.   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  Progressively worsening symptoms.  Failed conservative treatment.    Past Medical History:  Diagnosis Date  . Anxiety   . Back injury   . Cervical disc disease   . Chronic back pain   . Chronic low back pain 03/29/2016  . GERD (gastroesophageal reflux disease)   . Insomnia   . Lumbar disc disease   . MIGRAINE, COMMON 09/23/2010   Qualifier: Diagnosis of  By: Jenny Reichmann MD, Hunt Oris   . Pneumonia   . Sickle cell trait [facility])     Past Surgical History:  Procedure Laterality Date  . NO PAST SURGERIES      Family History  Problem Relation Age of Onset  . Congestive Heart Failure Father   . Hypertension Mother   . Hyperlipidemia Mother    Social History:  reports that he has been smoking.  He has been smoking about 1.00 pack per day. he has never used smokeless tobacco. He reports that he does not drink alcohol or use drugs.  Allergies:  Allergies  Allergen Reactions  . Other Hives and Swelling    Antibiotic that was given when he had pneumonia     Medications Prior to Admission  Medication Sig Dispense Refill  . ALPRAZolam (XANAX) 0.25 MG tablet Take 0.25 mg 3 (three) times daily by mouth.    . diphenhydrAMINE (BENADRYL) 25 MG tablet Take 25 mg at bedtime by mouth.    . gabapentin (NEURONTIN) 300 MG capsule Take 300 mg 3 (three) times daily by mouth.  5  . oxycodone (ROXICODONE) 30 MG immediate release tablet Take 1 tablet (30 mg total) by mouth 6 (six) times daily. 90 tablet 0  . OXYCONTIN 20 MG 12 hr tablet Take 20 mg by mouth 2 (two) times daily.  0  . Polyethyl Glycol-Propyl Glycol (LUBRICANT EYE DROPS) 0.4-0.3 % SOLN Place 1-2 drops 3 (three) times daily as needed into both eyes (for dry/irritated eyes).      Results for orders placed or performed during the hospital encounter of 08/28/17 (from the  past 48 hour(s))  CBC     Status: None   Collection Time: 08/28/17  6:24 AM  Result Value Ref Range   WBC 7.1 4.0 - 10.5 K/uL   RBC 5.16 4.22 - 5.81 MIL/uL   Hemoglobin 14.0 13.0 - 17.0 g/dL   HCT 41.7 39.0 - 52.0 %   MCV 80.8 78.0 - 100.0 fL   MCH 27.1 26.0 - 34.0 pg   MCHC 33.6 30.0 - 36.0 g/dL   RDW 15.0 11.5 - 15.5 %   Platelets 258 150 - 400 K/uL  Protime-INR     Status: None   Collection Time: 08/28/17  6:24 AM  Result Value Ref Range   Prothrombin Time 12.7 11.4 - 15.2 seconds   INR 0.96   APTT     Status: None   Collection Time: 08/28/17  6:24 AM  Result Value Ref Range   aPTT 29 24 - 36 seconds   No results found.  Review of Systems  Constitutional: Negative.   HENT: Negative.   Eyes: Negative.   Respiratory: Negative.   Cardiovascular: Negative.   Gastrointestinal: Negative.   Genitourinary: Negative.   Musculoskeletal: Positive for back pain.  Skin: Negative.   Neurological: Positive for tingling.  Psychiatric/Behavioral: Negative.  Blood pressure (!) 162/98, pulse 71, temperature 98.4 F (36.9 C), temperature source Oral, resp. rate 20, height 5\' 8"  (1.727 m), weight 150 lb (68 kg), SpO2 99 %. Physical Exam  Constitutional: He is oriented to person, place, and time. He appears well-developed. No distress.  HENT:  Head: Normocephalic and atraumatic.  Eyes: EOM are normal. Pupils are equal, round, and reactive to light.  Neck: Normal range of motion.  Respiratory: No respiratory distress.  GI: He exhibits no distension.  Musculoskeletal: He exhibits tenderness.  Neurological: He is alert and oriented to person, place, and time.  Skin: Skin is warm and dry.  Psychiatric: He has a normal mood and affect.     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  Surgical procedure along with possible risks and complications discussed.  All questions answered.    Benjiman Core, PA-C 08/28/2017, 7:11 AM

## 2017-08-28 NOTE — Discharge Instructions (Signed)
    No lifting greater than 10 lbs. Avoid bending, stooping and twisting. Walk in house for first week them may start to get out slowly increasing distance up to one mile by 3 weeks post op. Keep incision dry for 3 days, may use tegaderm or similar water impervious dressing.  

## 2017-08-28 NOTE — Transfer of Care (Signed)
Immediate Anesthesia Transfer of Care Note  Patient: Jason [name]  Procedure(s) Performed: BILATERAL PARTIAL HEMILAMINECTOMIES L4-5 WITH MICRODISCECTOMY (Bilateral Back)  Patient Location: PACU  Anesthesia Type:General  Level of Consciousness: awake, alert  and patient cooperative  Airway & Oxygen Therapy: Patient Spontanous Breathing and Patient connected to nasal cannula oxygen  Post-op Assessment: Report given to RN, Post -op Vital signs reviewed and stable and Patient moving all extremities X 4  Post vital signs: Reviewed and stable  Last Vitals:  Vitals:   08/28/17 0618  BP: (!) 162/98  Pulse: 71  Resp: 20  Temp: 36.9 C  SpO2: 99%    Last Pain:  Vitals:   08/28/17 0650  TempSrc:   PainSc: 7       Patients Stated Pain Goal: 3 (77/41/42 3953)  Complications: No apparent anesthesia complications

## 2017-08-28 NOTE — Evaluation (Signed)
Physical Therapy Evaluation Patient Details Name: Jason [name] MRN: [id number] DOB: 07/13/1981 Today's Date: 08/28/2017   History of Present Illness  Pt is a 36 y/o male s/p L4-5 hemilaminectomy and microdiscectomy. PMH includes anxiety, sickle cell trait, migrane, and chronic low back pain.   Clinical Impression  Pt is s/p surgery above with deficits below. PTA, pt was independent with functional mobility. Upon eval, pt very limited by pain in L posterior thigh region and in back. Pt required assist for LLE advancement and exhibited decreased WB on LLE during gait. Relied heavily on UE support when taking a step with LLE. Distance limited by pain. Anticipate pt will progress well once pain controlled. Recommending HHPT at d/c to increase independence and safety with functional mobility. Will have assist from family at home at d/c. Will need DME below to increase safety with mobility at home. Will continue to follow acutely and progress mobility according to pt tolerance.     Follow Up Recommendations Home health PT;Supervision/Assistance - 24 hour    Equipment Recommendations  Rolling walker with 5" wheels;3in1 (PT)    Recommendations for Other Services       Precautions / Restrictions Precautions Precautions: Back Precaution Booklet Issued: Yes (comment) Precaution Comments: Reviewed back precautions with pt.  Restrictions Weight Bearing Restrictions: No      Mobility  Bed Mobility Overal bed mobility: Needs Assistance Bed Mobility: Rolling;Sit to Sidelying;Sidelying to Sit Rolling: Min guard Sidelying to sit: Min guard     Sit to sidelying: Min guard General bed mobility comments: Min guard to ensure log roll technique. Verbal cues for sequencing. Use of bed rails for rolling. Increased time required secondary to pain in L posterior thigh.   Transfers Overall transfer level: Needs assistance Equipment used: Rolling walker (2 wheeled) Transfers: Sit to/from Stand Sit  to Stand: Min assist         General transfer comment: Min A for lift assist and steadying. Verbal cues for safe hand placement. Unable to bear full weight on LLE in standing secondary to increased pain.   Ambulation/Gait Ambulation/Gait assistance: Min assist Ambulation Distance (Feet): 5 Feet Assistive device: Rolling walker (2 wheeled) Gait Pattern/deviations: Step-to pattern;Decreased step length - right;Decreased step length - left;Decreased weight shift to left;Narrow base of support Gait velocity: Decreased Gait velocity interpretation: Below normal speed for age/gender General Gait Details: Slow, unsteady gait. Unable to bear weight on LLE and required assist for LLE advancement. Walking on L toe and relying heavily on UE support secondary to increased pain in LLE. Verbal cues for upright posture throughout. Distance limited secondary to pain.   Stairs            Wheelchair Mobility    Modified Rankin (Stroke Patients Only)       Balance Overall balance assessment: Needs assistance Sitting-balance support: No upper extremity supported;Feet supported Sitting balance-Leahy Scale: Good     Standing balance support: Bilateral upper extremity supported;During functional activity Standing balance-Leahy Scale: Poor Standing balance comment: Reliant on RW for stability.                              Pertinent Vitals/Pain Pain Assessment: Faces Faces Pain Scale: Hurts whole lot Pain Location: L posterior thigh and back  Pain Descriptors / Indicators: Aching;Constant;Operative site guarding;Grimacing Pain Intervention(s): Limited activity within patient's tolerance;Monitored during session;RN gave pain meds during session;Repositioned    Home Living Family/patient expects to be discharged to:: Private residence  Living Arrangements: Spouse/significant other;Parent;Children Available Help at Discharge: Family;Available 24 hours/day Type of Home: House Home  Access: Stairs to enter Entrance Stairs-Rails: None Entrance Stairs-Number of Steps: 1(threshold step ) Home Layout: Two level;Laundry or work area in basement;Able to live on main level with [employer]: None      Prior Function Level of Independence: Independent               Hand Dominance        Extremity/Trunk Assessment   Upper Extremity Assessment Upper Extremity Assessment: Defer to OT evaluation    Lower Extremity Assessment Lower Extremity Assessment: LLE deficits/detail LLE Deficits / Details: Reports numbness which he has at baseline, and also reports constant pain which is more intense than normal on posterior thigh. Had difficulty advancing LLE forward during gait, however, was able to lift LLE back into bed with return to sidelying.     Cervical / Trunk Assessment Cervical / Trunk Assessment: Other exceptions Cervical / Trunk Exceptions: s/p back surgery   Communication   Communication: No difficulties  Cognition Arousal/Alertness: Awake/alert Behavior During Therapy: WFL for tasks assessed/performed Overall Cognitive Status: Within Functional Limits for tasks assessed                                        General Comments General comments (skin integrity, edema, etc.): Pt's mother present during session. Educated about generalized walking program to perform at home.     Exercises     Assessment/Plan    PT Assessment Patient needs continued PT services  PT Problem List Decreased strength;Decreased activity tolerance;Decreased balance;Decreased mobility;Decreased knowledge of use of DME;Impaired sensation;Decreased knowledge of precautions;Pain       PT Treatment Interventions DME instruction;Gait training;Stair training;Therapeutic activities;Functional mobility training;Balance training;Therapeutic exercise;Neuromuscular re-education;Patient/family education    PT Goals (Current goals can be found in the  Care Plan section)  Acute Rehab PT Goals Patient Stated Goal: to decrease pain in LLE  PT Goal Formulation: With patient Time For Goal Achievement: 09/04/17 Potential to Achieve Goals: Good    Frequency Min 5X/week   Barriers to discharge        Co-evaluation               AM-PAC PT "6 Clicks" Daily Activity  Outcome Measure Difficulty turning over in bed (including adjusting bedclothes, sheets and blankets)?: A Little Difficulty moving from lying on back to sitting on the side of the bed? : A Little Difficulty sitting down on and standing up from a chair with arms (e.g., wheelchair, bedside commode, etc,.)?: Unable Help needed moving to and from a bed to chair (including a wheelchair)?: A Little Help needed walking in hospital room?: A Little Help needed climbing 3-5 steps with a railing? : A Lot 6 Click Score: 15    End of Session Equipment Utilized During Treatment: Gait belt Activity Tolerance: Patient limited by pain Patient left: in bed;with call bell/phone within reach;with family/visitor present Nurse Communication: Mobility status PT Visit Diagnosis: Unsteadiness on feet (R26.81);Muscle weakness (generalized) (M62.81);Other symptoms and signs involving the nervous system (R29.898);Other abnormalities of gait and mobility (R26.89);Pain Pain - Right/Left: Left Pain - part of body: Leg(back )    Time: 4098-1191 PT Time Calculation (min) (ACUTE ONLY): 25 min   Charges:   PT Evaluation $PT Eval Moderate Complexity: 1 Mod PT Treatments $Gait Training: 8-22 mins   PT G Codes:  PT G-Codes **NOT FOR INPATIENT CLASS** Functional Assessment Tool Used: AM-PAC 6 Clicks Basic Mobility;Clinical judgement Functional Limitation: Mobility: Walking and moving around Mobility: Walking and Moving Around Current Status (T0354): At least 40 percent but less than 60 percent impaired, limited or restricted Mobility: Walking and Moving Around Goal Status [phone]): At least 1  percent but less than 20 percent impaired, limited or restricted    Leighton Ruff, PT, DPT  Acute Rehabilitation Services  Pager: [phone] 08/28/2017, 2:53 PM

## 2017-08-28 NOTE — Anesthesia Preprocedure Evaluation (Signed)
Anesthesia Evaluation  Patient identified by MRN, date of birth, ID band Patient awake    Reviewed: Allergy & Precautions, NPO status , Patient's Chart, lab work & pertinent test results  Airway Mallampati: II  TM Distance: >3 FB Neck ROM: Full    Dental  (+) Teeth Intact, Poor Dentition, Dental Advisory Given,    Pulmonary Current Smoker,    breath sounds clear to auscultation       Cardiovascular  Rhythm:Regular Rate:Normal     Neuro/Psych    GI/Hepatic   Endo/Other    Renal/GU      Musculoskeletal   Abdominal   Peds  Hematology   Anesthesia Other Findings   Reproductive/Obstetrics                             Anesthesia Physical Anesthesia Plan  ASA: II  Anesthesia Plan: General   Post-op Pain Management:    Induction: Intravenous  PONV Risk Score and Plan: Ondansetron and Dexamethasone  Airway Management Planned: Oral ETT  Additional Equipment:   Intra-op Plan:   Post-operative Plan: Extubation in OR  Informed Consent: I have reviewed the patients History and Physical, chart, labs and discussed the procedure including the risks, benefits and alternatives for the proposed anesthesia with the patient or authorized representative who has indicated his/her understanding and acceptance.   Dental advisory given  Plan Discussed with: CRNA and Anesthesiologist  Anesthesia Plan Comments:         Anesthesia Quick Evaluation

## 2017-08-28 NOTE — Anesthesia Procedure Notes (Signed)
Procedure Name: Intubation Date/Time: 08/28/2017 8:01 AM Performed by: Lowella Dell, CRNA Pre-anesthesia Checklist: Patient identified, Emergency Drugs available, Suction available and Patient being monitored Patient Re-evaluated:Patient Re-evaluated prior to induction Oxygen Delivery Method: Circle System Utilized Preoxygenation: Pre-oxygenation with 100% oxygen Induction Type: IV induction Ventilation: Mask ventilation without difficulty and Oral airway inserted - appropriate to patient size Laryngoscope Size: Mac and 4 Grade View: Grade I Tube type: Oral Number of attempts: 1 Airway Equipment and Method: Stylet Placement Confirmation: ETT inserted through vocal cords under direct vision,  positive ETCO2 and breath sounds checked- equal and bilateral Secured at: 22 cm Tube secured with: Tape Dental Injury: Teeth and Oropharynx as per pre-operative assessment  Comments: Bottom lip cracked d/t dryness prior to induction. Lubrication applied.

## 2017-08-28 NOTE — Progress Notes (Signed)
Patient states that he had a sip of a frappucino at 0330.  Dr. Linna Caprice notified.

## 2017-08-28 NOTE — Op Note (Signed)
08/28/2017  10:05 AM  PATIENT:  Jason [name]  36 y.o. 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   PROCEDURE:The patient was met in the holding area, and the appropriate lumbar level L4-5 identified and marked with "x" and my initials.The patient was then transported to OR and was placed under general anesthesia without difficulty. The patient received appropriate preoperative antibiotic prophylaxis ancef 2 gm. No catheter was placed. The patient after intubation atraumatically was transferred to the operating room table, prone position, Wilson frame, sliding OR table. All pressure points were well padded. The arms in 90-90 well-padded at the elbows. Standard prep with DuraPrep solution lower dorsal spine to the mid sacral segment. Draped in the usual manner iodine Vi-Drape was used. Time-out procedure was called and correct. 2x 18-gauge spinal needle was then inserted at the expected L4-5 level. C-arm was draped sterilely to the field and used to identify the spinal needles positions. The needle was at the lower aspect of the lamina of L4. Skin superior to this was then infiltrated with MARCAINE1/2% 1:1 EXPAREL 1.3% total of 20 cc used. An incision approximately an inch inch and a half in length was then made through skin and subcutaneous layers in line with the midline just superior to the spinal needle entry point. An incision made into the right and left lumbosacral fascia approximately an inch in length .  Smallest Cobb was then introduced into the incision site and used to  carefully perform subperiosteal movement of the left paralumbar muscles off of the posterior lamina of the expected L4-5 level.  The depth measured off of the dilators at about 40 mm and two 40 mm retractors and placed on the scaffolding for the [facility] equipment and guided down to and docking on the posterior aspect of the lamina at the expected L4-L5 level. This was sterilely attached to the articulating arm and it's up right which had been attached the OR table sterilely. Cross table lateral was identified a Penfield #4 at the L4-5 facet and the retractors at the appropriate level L4-5. Using loope magnification the L4-5 interspace carefully debrided the small amount of muscle attachment here and high-speed bur used to drill the medial aspect of the inferior articular process of L4 bilaterally approximately 15%. 3 mm Kerrison then used to enter the spinal canal over the superior aspect of the L5 lamina carefully using the Kerrison to debris the attachment as a curet. Foraminotomy was then performed over the L5 nerve roots. The medial 10% superior articular process of L5 and then resected using a 3 mm Kerrison. This allowed for identification of the thecal sac.Attention then turned to the right L4-5 level which was easily visualized with sterile OR microscope and the MIS retractors. Soft tissues debrided about the Left L4-5 area over the posterior aspect of the L4-5 interspace. High-speed bur and then used to carefully drill inferior 3 or 4 mm of the right side L4 lamina and on the medial aspect of the left L4 inferior articular process of 3 mm. The superior margin of the L5 lamina then carefully  debrided with curette and 37m kerrison then used to enter the spinal canal over the superior aspect of the L5 lamina resecting bone over the superior aspect and freeing up the attachment of ligamentum flavum here. Ligamentum flavum then debrided with the 3 mm Kerrison left L5 nerve root and the lateral recess  decompressed using 2 and 3 mm Kerrisons sizing hypertrophic reflected ligamentum flavum extending superiorly. From was resected off the ventral aspect of the inferior margin of the L4 lamina. Hockey-stick nerve probe could then be passed out the L4 neuroforamen of the L5 neuroforamen. Venous bleeding encountered. Irrigation was carried out down to this bleeding controlled with Gelfoam. Gelfoam was then removed. Irrigation and careful examination demonstrated no active bleeding present.2 mm Kerrison then used to enter the spinal canal over the superior aspect of the L5 lamina carefully using the Kerrison to debris the attachment as a curet. Foraminotomy was then performed over the right L5 nerve root. The L5 nerve root was then carefully retracted medially using first a derricho retractor which did not have enough depth so that a right angle MIS retractor was used. Bleeding from epidural veins required bipolar electrocautery and Gelfoam with cottonoids. The disc on the right side at L4-51 was identified and ipalpated with a #4 Penfield and observed to be bulging, firm without herniation. The disc then examined on the left side and found to be soft and protruded. The lateral left thecal sac at L4-5 then protected with a derricho and the left posterior disc then incised with a #15 blade scalpel. With this disc material extruded and the MIS pituitary rongeurs were used to debride the disc of degenerated and herniated disc material centrally. Care was taken to retract the L5 nerve root lateral thecal sac allowing for the disc to be excised across the midline to the left side. Up-biting pituitary rongeurs were used and the disc space debrided until no further loose fragments were found to be present. A Woodson retractor was used to examine the spinal canal and the area posterior to the L4-5 disc space showed no significant remaining disc protrusion. The L4 and L5 neuroforamen probe freely with no sign remaining nerve  compression.Thrombin-soaked Gelfoam used to control this following this then the sac and the  right L5 nerve root were mobilized medially and the L4-5 disc examined and found not to be herniated. Lateral recess decompression was carried out. Retractors were then carefully removed bleeding was then controlled using bipolar electrocautery. Small amount of bleeding within the soft tissue mass the laminotomy area was controlled using bipolar electrocautery. Irrigation was carried out using copious amounts of irrigant solution. All Gelfoam were then removed. No significant active bleeding present at the time of removal. All instruments sponge counts were correct traction system was then carefully removed carefully and with this withdrawal and only bipolar electrocautery of any small bleeders.Gelfoam foam was removed from the right and left sides and irrigation was performed. No active bleeding was present. This patient has a history of low platelet count. Subcutaneous tissue and fascia further infiltrated with MARCAINE1/2% 1:1 EXPAREL 1.3%, total amount: 30 ml. Lumbodorsal fascia was then carefully approximated with interrupted 0 Vicryl sutures, UR 6 needle deep subcutaneous layers were approximated with interrupted 0 Vicryl sutures on UR 6 the appear subcutaneous layers approximated with interrupted 2-0 Vicryl sutures and the skin closed with a running subcutaneous stitch of 4-0 Vicryl. Dermabond was applied allowed to dry and then Mepilex bandage applied. Patient was then carefully returned to supine position  on a stretcher, reactivated and extubated. He was then returned to recovery room in satisfactory condition.     Benjiman Core PA-C perform the duties of assistant surgeon during this case. He was present from the beginning of the case to the end of the case assisting in transfer the patient from his stretcher to the OR table and back to the stretcher at the end of the case. Assisted in careful retraction and  suction of the laminectomy site delicate neural structures operating under the operating room microscope. He performed closure of the incision from the fascia to the skin applying the dressing.    [name]  08/28/2017, 10:05 AM

## 2017-08-28 NOTE — Progress Notes (Signed)
Pt's mother complained about the size of the rooms on 3C and requested that her son be moved to another room. DD was notified and she came to speak with the Pt's mother. Pt transferred to 5N24. Report called to RN. Pt transferred by wheelchair @ 1540 by SWOT RN. Holli Humbles, RN

## 2017-08-28 NOTE — Brief Op Note (Signed)
08/28/2017  9:50 AM  PATIENT:  Jason [name]  35 y.o. 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 Linna Caprice  EBL:  200 mL   BLOOD ADMINISTERED:none  DRAINS: none   LOCAL MEDICATIONS USED:  MARCAINE 0.5% 1:1 EXPAREL 1.3% Amount: 30 ml  SPECIMEN:  No Specimen  DISPOSITION OF SPECIMEN:  N/A  COUNTS:  YES  TOURNIQUET:  * No tourniquets in log *  DICTATION: .Dragon Dictation  PLAN OF CARE: Admit for overnight observation  PATIENT DISPOSITION:  PACU - hemodynamically stable.   Delay start of Pharmacological VTE agent (>24hrs) due to surgical blood loss or risk of bleeding: yes

## 2017-08-28 NOTE — Anesthesia Postprocedure Evaluation (Signed)
Anesthesia Post Note  Patient: Jason [name]  Procedure(s) Performed: BILATERAL PARTIAL HEMILAMINECTOMIES L4-5 WITH MICRODISCECTOMY (Bilateral Back)     Patient location during evaluation: PACU Anesthesia Type: General Level of consciousness: awake, awake and alert and oriented Pain management: pain level controlled Vital Signs Assessment: post-procedure vital signs reviewed and stable Respiratory status: spontaneous breathing, nonlabored ventilation and respiratory function stable Cardiovascular status: blood pressure returned to baseline Anesthetic complications: no    Last Vitals:  Vitals:   08/28/17 1240 08/28/17 1500  BP: (!) 135/92 110/68  Pulse: 70 66  Resp: 16 16  Temp: 37.1 C 36.9 C  SpO2: 97% 96%    Last Pain:  Vitals:   08/28/17 1510  TempSrc:   PainSc: 8                  Seaira Byus COKER

## 2017-08-29 ENCOUNTER — Encounter (HOSPITAL_COMMUNITY): Payer: Self-pay | Admitting: Specialist

## 2017-08-29 ENCOUNTER — Other Ambulatory Visit: Payer: Self-pay

## 2017-08-29 DIAGNOSIS — Z9889 Other specified postprocedural states: Secondary | ICD-10-CM

## 2017-08-29 LAB — CBC
HEMATOCRIT: 35.9 % — AB (ref 39.0–52.0)
Hemoglobin: 12.2 g/dL — ABNORMAL LOW (ref 13.0–17.0)
MCH: 27.2 pg (ref 26.0–34.0)
MCHC: 34 g/dL (ref 30.0–36.0)
MCV: 80.1 fL (ref 78.0–100.0)
PLATELETS: 267 10*3/uL (ref 150–400)
RBC: 4.48 MIL/uL (ref 4.22–5.81)
RDW: 15.2 % (ref 11.5–15.5)
WBC: 12.7 10*3/uL — AB (ref 4.0–10.5)

## 2017-08-29 MED ORDER — DEXAMETHASONE SODIUM PHOSPHATE 10 MG/ML IJ SOLN
10.0000 mg | Freq: Four times a day (QID) | INTRAMUSCULAR | Status: AC
  Administered 2017-08-29 (×2): 10 mg via INTRAVENOUS
  Filled 2017-08-29 (×2): qty 1

## 2017-08-29 MED ORDER — OXYCODONE HCL 5 MG PO TABS: 5.0000 mg | ORAL_TABLET | ORAL | 0 refills | Status: DC | PRN

## 2017-08-29 MED ORDER — METHOCARBAMOL 500 MG PO TABS: 500.0000 mg | ORAL_TABLET | Freq: Three times a day (TID) | ORAL | 1 refills | Status: DC | PRN

## 2017-08-29 MED ORDER — DOCUSATE SODIUM 100 MG PO CAPS: 100.0000 mg | ORAL_CAPSULE | Freq: Two times a day (BID) | ORAL | 0 refills | Status: DC

## 2017-08-29 NOTE — Progress Notes (Signed)
Physical Therapy Treatment Patient Details Name: Jason [name] MRN: [id number] DOB: 04-05-1981 Today's Date: 08/29/2017    History of Present Illness Pt is a 36 y/o male s/p L4-5 hemilaminectomy and microdiscectomy. PMH includes anxiety, sickle cell trait, migrane, and chronic low back pain.     PT Comments    Patient is making progress toward mobility goals. Pt tolerated increased gait distance and able to safely negotiate single step simulating home entrance. Pt does continue to demonstrate L LE weakness with knee buckling at times and c/o L LE pain. Continue to progress as tolerated.     Follow Up Recommendations  Home health PT;Supervision/Assistance - 24 hour     Equipment Recommendations  Rolling walker with 5" wheels;3in1 (PT)    Recommendations for Other Services       Precautions / Restrictions Precautions Precautions: Back Precaution Booklet Issued: Yes (comment) Precaution Comments: Reviewed back precautions with pt Restrictions Weight Bearing Restrictions: No    Mobility  Bed Mobility Overal bed mobility: Needs Assistance Bed Mobility: Rolling;Sidelying to Sit Rolling: Min guard Sidelying to sit: Min guard       General bed mobility comments: pt demonstrated good log roll technique  Transfers Overall transfer level: Needs assistance Equipment used: Rolling walker (2 wheeled) Transfers: Sit to/from Stand Sit to Stand: Min assist;Min guard         General transfer comment: cues for safe hand placement and technique to decreased excessive trunk flexion; assist for balance upon standing due to L knee weakness and pt maintaining L kne flexion in standing  Ambulation/Gait Ambulation/Gait assistance: Min assist;+2 safety/equipment;Mod assist(chair follow) Ambulation Distance (Feet): 100 Feet Assistive device: Rolling walker (2 wheeled) Gait Pattern/deviations: Decreased step length - right;Decreased step length - left;Decreased weight shift to  left;Narrow base of support;Step-through pattern;Trunk flexed Gait velocity: Decreased   General Gait Details: cues for posture, sequencing, and multimodal cues for L quad activation during stance phase; L knee instability noted and pt requiring increased assist at times    Stairs Stairs: Yes   Stair Management: No rails;Step to pattern;Backwards;With walker Number of Stairs: 1 General stair comments: cues for sequencing and technique; assist to steady  Wheelchair Mobility    Modified Rankin (Stroke Patients Only)       Balance Overall balance assessment: Needs assistance Sitting-balance support: No upper extremity supported;Feet supported Sitting balance-Leahy Scale: Good     Standing balance support: Bilateral upper extremity supported;During functional activity Standing balance-Leahy Scale: Poor Standing balance comment: Reliant on RW for stability.                             Cognition Arousal/Alertness: Awake/alert Behavior During Therapy: WFL for tasks assessed/performed Overall Cognitive Status: Within Functional Limits for tasks assessed                                        Exercises      General Comments        Pertinent Vitals/Pain Pain Assessment: 0-10 Pain Score: 7  Faces Pain Scale: Hurts whole lot Pain Location: L posterior thigh and back  Pain Descriptors / Indicators: Aching;Constant;Grimacing;Guarding Pain Intervention(s): Limited activity within patient's tolerance;Monitored during session;Premedicated before session;Repositioned    Home Living Family/patient expects to be discharged to:: Private residence  Prior Function            PT Goals (current goals can now be found in the care plan section) Acute Rehab PT Goals Patient Stated Goal: to decrease pain in LLE  PT Goal Formulation: With patient Time For Goal Achievement: 09/04/17 Potential to Achieve Goals: Good Progress  towards PT goals: Progressing toward goals    Frequency    Min 5X/week      PT Plan Current plan remains appropriate    Co-evaluation PT/OT/SLP Co-Evaluation/Treatment: Yes Reason for Co-Treatment: For patient/therapist safety;To address functional/ADL transfers   OT goals addressed during session: ADL's and self-care;Proper use of Adaptive equipment and DME      AM-PAC PT "6 Clicks" Daily Activity  Outcome Measure  Difficulty turning over in bed (including adjusting bedclothes, sheets and blankets)?: A Lot Difficulty moving from lying on back to sitting on the side of the bed? : A Lot Difficulty sitting down on and standing up from a chair with arms (e.g., wheelchair, bedside commode, etc,.)?: Unable Help needed moving to and from a bed to chair (including a wheelchair)?: A Little Help needed walking in hospital room?: A Little Help needed climbing 3-5 steps with a railing? : A Lot 6 Click Score: 13    End of Session Equipment Utilized During Treatment: Gait belt Activity Tolerance: Patient tolerated treatment well Patient left: with call bell/phone within reach;with family/visitor present;in chair Nurse Communication: Mobility status PT Visit Diagnosis: Unsteadiness on feet (R26.81);Muscle weakness (generalized) (M62.81);Other symptoms and signs involving the nervous system (R29.898);Other abnormalities of gait and mobility (R26.89);Pain Pain - Right/Left: Left Pain - part of body: Leg(back )     Time: 4742-5956 PT Time Calculation (min) (ACUTE ONLY): 41 min  Charges:  $Gait Training: 8-22 mins                    G Codes:       Earney Navy, PTA Pager: [phone]     Darliss Cheney 08/29/2017, 3:29 PM

## 2017-08-29 NOTE — Progress Notes (Signed)
     Subjective: 1 Day Post-Op Procedure(s) (LRB): BILATERAL PARTIAL HEMILAMINECTOMIES L4-5 WITH MICRODISCECTOMY (Bilateral) Awake, alert and oriented x 4. I have some discomfort the back of the left leg and calf. Some numbness in the same area. No BM, no nausea. Taking his regular scheduled oxycontin 20 mg q 12 and oxyIR 30 mg  q 4 hours and he is tolerating the discomfort with the use of ice packs. The room on 3C too small for family, and the family requested a transfer. Moved from [facility] bed 7 to 5 North Bed 24. PT recommends a rolling walker. Only the 3-in-1 ordered post op will order DME walker with wheels.  Patient reports pain as moderate.    Objective:   VITALS:  Temp:  [97.9 F (36.6 C)-98.7 F (37.1 C)] 98.4 F (36.9 C) (11/13 0542) Pulse Rate:  [62-102] 62 (11/13 0542) Resp:  [10-17] 12 (11/13 0542) BP: (105-142)/(68-99) 117/69 (11/13 0542) SpO2:  [96 %-100 %] 96 % (11/13 0542)  Neurologically intact ABD soft Neurovascular intact Sensation intact distally Intact pulses distally Dorsiflexion/Plantar flexion intact Incision: no drainage and changed dressing to opside with 2x2. mild swelling. no drainage. EHL 5-/5 left side.    LABS Recent Labs    08/28/17 0624 08/29/17 0528  HGB 14.0 12.2*  WBC 7.1 12.7*  PLT 258 267   Recent Labs    08/28/17 0624  NA 138  K 4.7  CL 100*  CO2 31  BUN 5*  CREATININE 0.96  GLUCOSE 84   Recent Labs    08/28/17 0624  INR 0.96     Assessment/Plan: 1 Day Post-Op Procedure(s) (LRB): BILATERAL PARTIAL HEMILAMINECTOMIES L4-5 WITH MICRODISCECTOMY (Bilateral) Left L5 root neurogenic pain likely secondary to swelling and nerve retraction.   Will give IV decadron.  Advance diet Up with therapy D/C IV fluids Discharge home with home health Will place orders and return to see this patient this afternoon to  See if he is ready for discharge.   Jason [name] 08/29/2017, 8:45 AMPatient ID: Jason [name], male   DOB: Apr 21, 1981,  36 y.o.   MRN: [id number]

## 2017-08-29 NOTE — Evaluation (Signed)
Occupational Therapy Evaluation Patient Details Name: Jason [name] MRN: [id number] DOB: Feb 17, 1981 Today's Date: 08/29/2017    History of Present Illness Pt is a 36 y/o male s/p L4-5 hemilaminectomy and microdiscectomy. PMH includes anxiety, sickle cell trait, migrane, and chronic low back pain.    Clinical Impression   Pt with decline in function and safety with ADLs and ADL mobility with decreased strength, balance and endurance. Pt is limited by pain and L foot "dragging" although pt more mobile that yesterday. Pt educated on ADL A/E for home use, transfers to toilet and shower using DME. Pt would benefit form acute OT services to address impairments to maximize level of function and safety    Follow Up Recommendations  Home health OT;Supervision/Assistance - 24 hour    Equipment Recommendations  3 in 1 bedside commode;Tub/shower seat;Other (comment)(ADL A/E kit)    Recommendations for Other Services       Precautions / Restrictions Precautions Precautions: Back Precaution Comments: Reviewed back precautions with pt.  Restrictions Weight Bearing Restrictions: No      Mobility Bed Mobility Overal bed mobility: Needs Assistance Bed Mobility: Rolling;Sit to Sidelying;Sidelying to Sit Rolling: Min guard Sidelying to sit: Min guard       General bed mobility comments: Min guard to ensure log roll technique. Verbal cues for sequencing. Use of bed rails for rolling. Increased time required secondary to pain in L posterior thigh.   Transfers Overall transfer level: Needs assistance Equipment used: Rolling walker (2 wheeled) Transfers: Sit to/from Stand Sit to Stand: Min assist;Min guard              Balance Overall balance assessment: Needs assistance Sitting-balance support: No upper extremity supported;Feet supported Sitting balance-Leahy Scale: Good     Standing balance support: Bilateral upper extremity supported;During functional activity Standing  balance-Leahy Scale: Poor                             ADL either performed or assessed with clinical judgement   ADL Overall ADL's : Needs assistance/impaired     Grooming: Wash/dry hands;Wash/dry face;Sitting;Min guard   Upper Body Bathing: Supervision/ safety;Set up   Lower Body Bathing: Moderate assistance   Upper Body Dressing : Supervision/safety;Set up   Lower Body Dressing: Moderate assistance   Toilet Transfer: Minimal assistance;Min guard;BSC;Ambulation;RW   Toileting- Clothing Manipulation and Hygiene: Sit to/from stand;Minimal assistance   Tub/ Shower Transfer: Minimal assistance;Min guard   Functional mobility during ADLs: Minimal assistance;Min guard;Cueing for safety;Rolling walker General ADL Comments: Pt educated on ADL A/E for home use (reacher, bath sponge, shoe horn, sock aid)     Vision Baseline Vision/History: (contacts) Patient Visual Report: No change from baseline       Perception     Praxis      Pertinent Vitals/Pain Pain Assessment: 0-10 Pain Score: 7  Faces Pain Scale: Hurts whole lot Pain Location: L posterior thigh and back  Pain Descriptors / Indicators: Aching;Constant;Operative site guarding;Grimacing Pain Intervention(s): Limited activity within patient's tolerance;Monitored during session;Premedicated before session;Repositioned     Hand Dominance Right   Extremity/Trunk Assessment Upper Extremity Assessment Upper Extremity Assessment: Overall WFL for tasks assessed   Lower Extremity Assessment Lower Extremity Assessment: Defer to PT evaluation   Cervical / Trunk Assessment Cervical / Trunk Assessment: Other exceptions Cervical / Trunk Exceptions: s/p back surgery    Communication Communication Communication: No difficulties   Cognition Arousal/Alertness: Awake/alert Behavior During Therapy: WFL for tasks assessed/performed Overall  Cognitive Status: Within Functional Limits for tasks assessed                                      General Comments   pt pleasant and cooperative    Exercises     Shoulder Instructions      Home Living Family/patient expects to be discharged to:: Private residence Living Arrangements: Spouse/significant other;Parent;Children Available Help at Discharge: Family;Available 24 hours/day Type of Home: House Home Access: Stairs to enter [employer] of Steps: 1 Entrance Stairs-Rails: None Home Layout: Two level;Laundry or work area in basement;Able to live on main level with bedroom/bathroom     Bathroom Shower/Tub: Occupational psychologist: Radio producer: None          Prior Functioning/Environment Level of Independence: Independent                 OT Problem List: Decreased activity tolerance;Decreased knowledge of use of DME or AE;Impaired balance (sitting and/or standing);Pain;Decreased knowledge of precautions      OT Treatment/Interventions: Self-care/ADL training;DME and/or AE instruction;Therapeutic activities;Therapeutic exercise;Patient/family education    OT Goals(Current goals can be found in the care plan section) Acute Rehab OT Goals Patient Stated Goal: to decrease pain in LLE  OT Goal Formulation: With patient Time For Goal Achievement: 09/05/17 Potential to Achieve Goals: Good ADL Goals Pt Will Perform Grooming: with supervision;with set-up;with caregiver independent in assisting;standing Pt Will Perform Lower Body Bathing: with min assist;with caregiver independent in assisting;with adaptive equipment Pt Will Perform Lower Body Dressing: with min assist;with caregiver independent in assisting;with adaptive equipment Pt Will Transfer to Toilet: with min guard assist;with supervision;ambulating Pt Will Perform Toileting - Clothing Manipulation and hygiene: with min assist;with caregiver independent in assisting Pt Will Perform Tub/Shower Transfer: with min guard assist;with  supervision;grab bars;rolling walker;3 in 1;ambulating;with caregiver independent in assisting  OT Frequency: Min 2X/week   Barriers to D/C:    no barriers       Co-evaluation PT/OT/SLP Co-Evaluation/Treatment: Yes Reason for Co-Treatment: For patient/therapist safety;To address functional/ADL transfers   OT goals addressed during session: ADL's and self-care;Proper use of Adaptive equipment and DME      AM-PAC PT "6 Clicks" Daily Activity     Outcome Measure Help from another person eating meals?: None Help from another person taking care of personal grooming?: A Little Help from another person toileting, which includes using toliet, bedpan, or urinal?: A Little Help from another person bathing (including washing, rinsing, drying)?: A Little Help from another person to put on and taking off regular upper body clothing?: A Little Help from another person to put on and taking off regular lower body clothing?: A Lot 6 Click Score: 18   End of Session Equipment Utilized During Treatment: Gait belt;Rolling walker;Other (comment)(3 in 1)  Activity Tolerance: Patient tolerated treatment well Patient left: in chair;with call bell/phone within reach;with family/visitor present  OT Visit Diagnosis: Unsteadiness on feet (R26.81);Muscle weakness (generalized) (M62.81);Pain                Time: 1126-1207 OT Time Calculation (min): 41 min Charges:  OT General Charges $OT Visit: 1 Visit OT Evaluation $OT Eval Moderate Complexity: 1 Mod OT Treatments $Therapeutic Activity: 8-22 mins G-Codes: OT G-codes **NOT FOR INPATIENT CLASS** Functional Assessment Tool Used: AM-PAC 6 Clicks Daily Activity     Britt Bottom 08/29/2017, 2:13 PM

## 2017-08-29 NOTE — Progress Notes (Signed)
     Subjective: 1 Day Post-Op Procedure(s) (LRB): BILATERAL PARTIAL HEMILAMINECTOMIES L4-5 WITH MICRODISCECTOMY (Bilateral)  Patient reports pain as marked.    Objective:   VITALS:  Temp:  [98.3 F (36.8 C)-98.5 F (36.9 C)] 98.4 F (36.9 C) (11/13 0542) Pulse Rate:  [62-66] 62 (11/13 0542) Resp:  [12-16] 12 (11/13 0542) BP: (110-120)/(68-82) 117/69 (11/13 0542) SpO2:  [96 %-100 %] 96 % (11/13 0542)  Neurologically intact ABD soft Neurovascular intact Sensation intact distally Intact pulses distally Dorsiflexion/Plantar flexion intact Incision: no drainage   LABS Recent Labs    08/28/17 0624 08/29/17 0528  HGB 14.0 12.2*  WBC 7.1 12.7*  PLT 258 267   Recent Labs    08/28/17 0624  NA 138  K 4.7  CL 100*  CO2 31  BUN 5*  CREATININE 0.96  GLUCOSE 84   Recent Labs    08/28/17 0624  INR 0.96  PT worked with patient and indicate concern of left leg weakness. Left DTRs  Knee 2+ and symmetric, Ankles 2+ and symmetric. Surgery was at L4-5, I do not expect quadriceps weakness Yet patient complains of the left leg wanting to give away, possibly a pain response but not explained by level of surgery or Findings at the time of surgery.   Assessment/Plan: 1 Day Post-Op Procedure(s) (LRB): BILATERAL PARTIAL HEMILAMINECTOMIES L4-5 WITH MICRODISCECTOMY (Bilateral)  Will keep and continue with PT and OT to improve his level of function prior to discharge.  D/C IVF, alarm appears to be continuous, he is tolerating pos.   Advance diet Up with therapy D/C IV fluids Plan for discharge tomorrow Discharge home with home health  [name] 08/29/2017, 1:47 PMPatient ID: Jason [name], male   DOB: 1981-05-06, 36 y.o.   MRN: [id number]

## 2017-08-29 NOTE — Care Management Note (Addendum)
Case Management Note  Patient Details  Name: Jason [name] MRN: [id number] Date of Birth: [birth year]  Subjective/Objective:   Bilateral partial hemilaminectomies with microdiscectomy                  Action/Plan: Discharge Planning: NCM spoke to pt and offered choice for HH/list provided. Pt agreeable to [facility] for HH. Contacted [facility] for HHPT/OT and RW and 3n1 for home. Pt states mother or fiance will be at home to assist with care.    [facility] cannot provided [place] OT. Contacted [facility] with new referral and they are able to service both HHPT/OT.  PCP Biagio Borg MD  Expected Discharge Date:  08/30/2017              Expected Discharge Plan:  [place]  In-House Referral:  NA  Discharge planning Services  CM Consult  Post Acute Care Choice:  Home Health Choice offered to:  Patient  DME Arranged:  3-N-1, Walker rolling DME Agency:  [place] Arranged:  PT, OT [facility] Agency:  [place]  Status of Service:  Completed, signed off  If discussed at [place] of Stay Meetings, dates discussed:    Additional Comments:  Erenest Rasher, RN 08/29/2017, 12:23 PM

## 2017-08-30 MED ORDER — METHYLPREDNISOLONE 4 MG PO TBPK
4.0000 mg | ORAL_TABLET | ORAL | Status: AC
  Administered 2017-08-30: 4 mg via ORAL
  Filled 2017-08-30: qty 21

## 2017-08-30 MED ORDER — DIPHENHYDRAMINE HCL 25 MG PO CAPS
25.0000 mg | ORAL_CAPSULE | Freq: Every day | ORAL | Status: DC
  Administered 2017-08-30: 25 mg via ORAL
  Filled 2017-08-30: qty 1

## 2017-08-30 MED ORDER — METHYLPREDNISOLONE 4 MG PO TBPK: 4.0000 mg | ORAL_TABLET | Freq: Four times a day (QID) | ORAL | Status: DC

## 2017-08-30 MED ORDER — METHYLPREDNISOLONE 4 MG PO TBPK: 8.0000 mg | ORAL_TABLET | Freq: Every evening | ORAL | Status: DC

## 2017-08-30 MED ORDER — METHYLPREDNISOLONE 4 MG PO TBPK
8.0000 mg | ORAL_TABLET | Freq: Every evening | ORAL | Status: AC
  Administered 2017-08-31: 8 mg via ORAL

## 2017-08-30 MED ORDER — METHYLPREDNISOLONE 4 MG PO TBPK
4.0000 mg | ORAL_TABLET | Freq: Three times a day (TID) | ORAL | Status: DC
  Administered 2017-08-31 (×2): 4 mg via ORAL

## 2017-08-30 MED ORDER — METHYLPREDNISOLONE 4 MG PO TBPK
8.0000 mg | ORAL_TABLET | Freq: Every morning | ORAL | Status: AC
  Administered 2017-08-30: 8 mg via ORAL
  Filled 2017-08-30: qty 21

## 2017-08-30 MED ORDER — METHYLPREDNISOLONE 4 MG PO TBPK
4.0000 mg | ORAL_TABLET | ORAL | Status: AC
  Administered 2017-08-30: 4 mg via ORAL

## 2017-08-30 MED ORDER — MAGNESIUM HYDROXIDE 400 MG/5ML PO SUSP
30.0000 mL | Freq: Once | ORAL | Status: AC
  Administered 2017-08-30: 30 mL via ORAL
  Filled 2017-08-30: qty 30

## 2017-08-30 NOTE — Progress Notes (Addendum)
Occupational Therapy Treatment Patient Details Name: Jason [name] MRN: [id number] DOB: 1981-04-27 Today's Date: 08/30/2017    History of present illness Pt is a 36 y/o male s/p L4-5 hemilaminectomy and microdiscectomy. PMH includes anxiety, sickle cell trait, migrane, and chronic low back pain.    OT comments  Pt demonstrating good progress toward OT goals. He demonstrated improved ability to complete ambulating toilet transfers with min guard assist and RW this session. Pt continues to demonstrate B LE weakness (L>R) and instability while standing at sink requiring at least single UE support during grooming tasks. Verbally reviewed techniques for use of AE for LB ADL and provided kit as pt demonstrated understanding during previous session 08/29/17. He would continue to benefit from OT services while admitted and D/C recommendation remains appropriate.    Follow Up Recommendations  Home health OT;Supervision/Assistance - 24 hour    Equipment Recommendations  3 in 1 bedside commode;Tub/shower seat;Other (comment)(ADL AE kit)    Recommendations for Other Services      Precautions / Restrictions Precautions Precautions: Back Precaution Comments: Reviewed back precautions with pt. Able to recall 3/3. Restrictions Weight Bearing Restrictions: No       Mobility Bed Mobility Overal bed mobility: Needs Assistance Bed Mobility: Rolling;Sidelying to Sit Rolling: Supervision Sidelying to sit: Supervision       General bed mobility comments: Supervision for safety with VC's for reminder to use log roll technique.   Transfers Overall transfer level: Needs assistance Equipment used: Rolling walker (2 wheeled) Transfers: Sit to/from Stand Sit to Stand: Min guard         General transfer comment: Min guard assist for safety. Pt with B LE weakness (L>R) impacting balance and stability in standing and during functional mobility.     Balance Overall balance assessment: Needs  assistance Sitting-balance support: No upper extremity supported;Feet supported Sitting balance-Leahy Scale: Good     Standing balance support: Bilateral upper extremity supported;During functional activity Standing balance-Leahy Scale: Poor Standing balance comment: Reliant on at least single UE support while standing at sink for grooming tasks.                            ADL either performed or assessed with clinical judgement   ADL Overall ADL's : Needs assistance/impaired     Grooming: Min guard;Standing Grooming Details (indicate cue type and reason): Close min guard as pt fatigues easily and with B knee buckling at times.                  Toilet Transfer: Min Agricultural engineer Details (indicate cue type and reason): Close min guard assist as pt with B LE weakness.         Functional mobility during ADLs: Min guard;Rolling walker;Cueing for safety General ADL Comments: Pt requiring cues to slow down and utilize energy conservation strategies. Pt unable to statically stand without UE support at sink during grooming tasks requiring adaptive methods to manipulate toothbrush/toothpaste.      Vision   Vision Assessment?: No apparent visual deficits Additional Comments: Reports wearing contacts at baseline.    Perception     Praxis      Cognition Arousal/Alertness: Awake/alert Behavior During Therapy: WFL for tasks assessed/performed Overall Cognitive Status: Within Functional Limits for tasks assessed  Exercises     Shoulder Instructions       General Comments      Pertinent Vitals/ Pain       Pain Assessment: 0-10 Pain Score: 7  Pain Location: abdomen and back Pain Descriptors / Indicators: Aching;Constant;Grimacing;Guarding Pain Intervention(s): Limited activity within patient's tolerance;Monitored during session;Repositioned;Patient requesting pain meds-RN  notified  Home Living                                          Prior Functioning/Environment              Frequency  Min 2X/week        Progress Toward Goals  OT Goals(current goals can now be found in the care plan section)  Progress towards OT goals: Progressing toward goals  Acute Rehab OT Goals Patient Stated Goal: decrease pain and go home OT Goal Formulation: With patient Time For Goal Achievement: 09/05/17 Potential to Achieve Goals: Good  Plan      Co-evaluation                 AM-PAC PT "6 Clicks" Daily Activity     Outcome Measure   Help from another person eating meals?: None Help from another person taking care of personal grooming?: A Little Help from another person toileting, which includes using toliet, bedpan, or urinal?: A Little Help from another person bathing (including washing, rinsing, drying)?: A Little Help from another person to put on and taking off regular upper body clothing?: A Little Help from another person to put on and taking off regular lower body clothing?: A Lot 6 Click Score: 18    End of Session Equipment Utilized During Treatment: Gait belt;Rolling walker  OT Visit Diagnosis: Unsteadiness on feet (R26.81);Muscle weakness (generalized) (M62.81);Pain   Activity Tolerance Patient tolerated treatment well   Patient Left in chair;with call bell/phone within reach;with family/visitor present   Nurse Communication          Time: 7737-3668 OT Time Calculation (min): 18 min  Charges: OT General Charges $OT Visit: 1 Visit OT Treatments $Self Care/Home Management : 8-22 mins  Norman Herrlich, MS OTR/L  Pager: [place] 08/30/2017, 9:41 AM

## 2017-08-30 NOTE — Progress Notes (Signed)
     Subjective: 2 Days Post-Op Procedure(s) (LRB): BILATERAL PARTIAL HEMILAMINECTOMIES L4-5 WITH MICRODISCECTOMY (Bilateral)Some weakness left leg, PT and OT noted improved gait though some persistent right greater than left leg weakness. No BM, complaints of left upper quadrant discomfort.   Patient reports pain as moderate.    Objective:   VITALS:  Temp:  [98 F (36.7 C)-98.6 F (37 C)] 98 F (36.7 C) (11/14 0433) Pulse Rate:  [67-75] 67 (11/14 0433) Resp:  [12-16] 12 (11/14 0433) BP: (107-136)/(60-81) 107/60 (11/14 0433) SpO2:  [97 %-100 %] 97 % (11/14 0433)  Neurologically intact ABD soft Neurovascular intact Sensation intact distally Intact pulses distally Dorsiflexion/Plantar flexion intact Incision: scant drainage   LABS Recent Labs    08/28/17 0624 08/29/17 0528  HGB 14.0 12.2*  WBC 7.1 12.7*  PLT 258 267   Recent Labs    08/28/17 0624  NA 138  K 4.7  CL 100*  CO2 31  BUN 5*  CREATININE 0.96  GLUCOSE 84   Recent Labs    08/28/17 0624  INR 0.96     Assessment/Plan: 2 Days Post-Op Procedure(s) (LRB): BILATERAL PARTIAL HEMILAMINECTOMIES L4-5 WITH MICRODISCECTOMY (Bilateral)  Chronic pain syndrome. Bilateral leg weakness post op bilateral lateral recess decompression with left microdiscectomy.  Advance diet Up with therapy D/C IV fluids Plan for discharge tomorrow Discharge home with home health  Discussed with patient plan to allow him to go home tomorrow after therapy.  Expect pain will be present for weeks and therapy with [facility] referral should be effective in improving his function.   [name] 08/30/2017, 1:58 PMPatient ID: Jason [name], male   DOB: Dec 12, 1980, 36 y.o.   MRN: [id number]

## 2017-08-30 NOTE — Progress Notes (Signed)
Physical Therapy Treatment Patient Details Name: Jason [name] MRN: [id number] DOB: [birth year] Today's Date: 08/30/2017    History of Present Illness Pt is a 36 y/o male s/p L4-5 hemilaminectomy and microdiscectomy. PMH includes anxiety, sickle cell trait, migrane, and chronic low back pain.     PT Comments    Patient demonstrated improved posture while ambulating. Pt continues to demonstrate L LE weakness however less instability noted this session. Pt tolerated increased gait distance, is able to recall precautions, and demonstrated good log roll technique. Current plan remains appropriate.   Follow Up Recommendations  Home health PT;Supervision/Assistance - 24 hour     Equipment Recommendations  Rolling walker with 5" wheels;3in1 (PT)    Recommendations for Other Services       Precautions / Restrictions Precautions Precautions: Back Precaution Booklet Issued: Yes (comment) Precaution Comments: Reviewed back precautions with pt. Able to recall 3/3. Restrictions Weight Bearing Restrictions: No    Mobility  Bed Mobility Overal bed mobility: Needs Assistance Bed Mobility: Rolling;Sit to Sidelying Rolling: Supervision Sidelying to sit: Supervision     Sit to sidelying: Supervision General bed mobility comments: carry over of log roll technique; supervision for safety  Transfers Overall transfer level: Needs assistance Equipment used: Rolling walker (2 wheeled) Transfers: Sit to/from Stand Sit to Stand: Min guard         General transfer comment: min guard for safety; cues for safe hand placement  Ambulation/Gait Ambulation/Gait assistance: Min assist Ambulation Distance (Feet): (139ft X 2) Assistive device: Rolling walker (2 wheeled) Gait Pattern/deviations: Decreased step length - right;Decreased step length - left;Step-through pattern;Trunk flexed Gait velocity: Decreased   General Gait Details: cues for posture; pt continues to demonstrate L LE  instability however improved versus previous session   Stairs            Wheelchair Mobility    Modified Rankin (Stroke Patients Only)       Balance Overall balance assessment: Needs assistance Sitting-balance support: No upper extremity supported;Feet supported Sitting balance-Leahy Scale: Good     Standing balance support: Bilateral upper extremity supported;During functional activity Standing balance-Leahy Scale: Poor Standing balance comment: Reliant on at least single UE support while standing at sink for grooming tasks.                             Cognition Arousal/Alertness: Awake/alert Behavior During Therapy: WFL for tasks assessed/performed Overall Cognitive Status: Within Functional Limits for tasks assessed                                        Exercises      General Comments        Pertinent Vitals/Pain Pain Assessment: Faces Pain Score: 7  Faces Pain Scale: Hurts little more Pain Location: L side abdomen and back Pain Descriptors / Indicators: Aching;Grimacing;Guarding Pain Intervention(s): Limited activity within patient's tolerance;Monitored during session;Repositioned    Home Living                      Prior Function            PT Goals (current goals can now be found in the care plan section) Acute Rehab PT Goals Patient Stated Goal: decrease pain and go home PT Goal Formulation: With patient Time For Goal Achievement: 09/04/17 Potential to Achieve Goals: Good Progress towards PT goals:  Progressing toward goals    Frequency    Min 5X/week      PT Plan Current plan remains appropriate    Co-evaluation              AM-PAC PT "6 Clicks" Daily Activity  Outcome Measure  Difficulty turning over in bed (including adjusting bedclothes, sheets and blankets)?: A Lot Difficulty moving from lying on back to sitting on the side of the bed? : A Lot Difficulty sitting down on and standing  up from a chair with arms (e.g., wheelchair, bedside commode, etc,.)?: A Lot Help needed moving to and from a bed to chair (including a wheelchair)?: A Little Help needed walking in hospital room?: A Little Help needed climbing 3-5 steps with a railing? : A Little 6 Click Score: 15    End of Session Equipment Utilized During Treatment: Gait belt Activity Tolerance: Patient tolerated treatment well Patient left: with call bell/phone within reach;in bed;with nursing/sitter in room Nurse Communication: Mobility status PT Visit Diagnosis: Unsteadiness on feet (R26.81);Muscle weakness (generalized) (M62.81);Other symptoms and signs involving the nervous system (R29.898);Other abnormalities of gait and mobility (R26.89);Pain Pain - Right/Left: Left Pain - part of body: Leg(back )     Time: 4825-0037 PT Time Calculation (min) (ACUTE ONLY): 16 min  Charges:  $Gait Training: 8-22 mins                    G Codes:       Earney Navy, PTA Pager: [phone]     Darliss Cheney 08/30/2017, 1:12 PM

## 2017-08-31 MED ORDER — METHYLPREDNISOLONE 4 MG PO TBPK: ORAL_TABLET | ORAL | 0 refills | Status: DC

## 2017-08-31 MED ORDER — OXYCODONE HCL ER 20 MG PO T12A: 20.0000 mg | EXTENDED_RELEASE_TABLET | Freq: Two times a day (BID) | ORAL | 0 refills | Status: DC

## 2017-08-31 MED ORDER — OXYCODONE HCL 30 MG PO TABS: 30.0000 mg | ORAL_TABLET | ORAL | 0 refills | Status: DC | PRN

## 2017-08-31 NOTE — Progress Notes (Addendum)
OT Cancellation Note  Patient Details Name: Jason [name] MRN: [id number] DOB: 07-30-1981   Cancelled Treatment:    Reason Eval/Treat Not Completed: Other (comment). Have attempted x 2 to see pt this a.m. And pt not available. Will try back later as time allows  Britt Bottom 08/31/2017, 11:12 AM

## 2017-08-31 NOTE — Discharge Summary (Signed)
Physician Discharge Summary      Patient ID: Jason [name] MRN: [id number] DOB/AGE: 19-Dec-1980 36 y.o.  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 Anxiety   . Back injury   . Cervical disc disease   . Chronic back pain   . Chronic low back pain 03/29/2016  . GERD (gastroesophageal reflux disease)   . Insomnia   . Lumbar disc disease   . MIGRAINE, COMMON 09/23/2010   Qualifier: Diagnosis of  By: Jenny Reichmann MD, Hunt Oris   . Pneumonia   . Sickle cell trait ([place])     Surgeries: Procedure(s): BILATERAL PARTIAL HEMILAMINECTOMIES L4-5 WITH MICRODISCECTOMY on 08/28/2017   Consultants:   Discharged Condition: Improved  Hospital Course: Jason [name] is an 36 y.o. male who was admitted 08/28/2017 with a chief complaint of No chief complaint on file. , and found to have a diagnosis of <principal problem not specified>.  He was brought to the operating room on 08/28/2017 and underwent the above named procedures.    He was  given perioperative antibiotics:  Anti-infectives (From admission, onward)   Start     Dose/Rate Route Frequency Ordered Stop   08/28/17 1600  ceFAZolin (ANCEF) IVPB 2g/100 mL premix     2 g 200 mL/hr over 30 Minutes Intravenous Every 8 hours 08/28/17 1237 08/29/17 0249   08/28/17 0630  ceFAZolin (ANCEF) 2-4 GM/100ML-% IVPB    Comments:  Forte, Lindsi   : cabinet override      08/28/17 0630 08/28/17 0805   08/28/17 0623  ceFAZolin (ANCEF) IVPB 2g/100 mL premix     2 g 200 mL/hr over 30 Minutes Intravenous On call to O.R. 08/28/17 8315 08/28/17 0820    He recovered in the PACU and was transferred to [facility] overnight stay unit. His mother expressed concern of the size of  the rooms and requested a transfer and he was then transferred to [facility]. He had  Normal vital signs and tolerated po nourishment and was able to stand at bedside with assistance. PT note left leg discomfort and weakness with standing on the left leg. His regular medications were restarted including oxycontin 20 mg q 12 hours and oxy IR 30mg  po every 4 hours.  He tolerated the medications well. POD#1 afebrile and VSS. He was seen in the AM and dressing assessed and clean dressing applied, no sign of infection. PT and OT notes indicated poor  Ability to ambulate due to left leg weakness and left leg pain. Expected 24 hour discharge was cancelled and his admission status changed to inpatient for continued rehabilitation post laminectomy. He was given decadron for probable perioperative neuropathic pain like due to nerve irritation and swelling post decompression, He was reassured.  POD #2 there was improved ability to ambulate, motor demonstrated weak left foot DF 5-/5, his reflexes at the knee and ankle were brisk and symmetric. The decadron discontinue a medrol dose pak  Was started. He was voiding without difficulty. He reported than his appt with Dr. Primus Bravo was scheduled for 11/15, tomorrow so that I requested that he contact Dr. Primus Bravo his pain management specialist to inform him that he is in the hospital and to try and reschedule an appt  for in one or two weeks. Current schedule of medication was relieving his pain. I recommend working with PT for one more day but plan for discharge. On POD#3 he was awake, alert and oriented x 4. Dressing with scant drainage. Motor was intact and strong in both legs. He was taking and tolerating po meds. He reported being able to move his bowels normally. PT and OT noted progress. He was discharged home on POD#3.   He was given sequential compression devices, early ambulation, and chemoprophylaxis of aspirin for DVT prophylaxis.  He benefited maximally from their  hospital stay and there were no complications.    Recent vital signs:  Vitals:   08/31/17 0606 08/31/17 1235  BP: 120/70 119/75  Pulse: 66 68  Resp: 18 18  Temp: 98.8 F (37.1 C) 98.1 F (36.7 C)  SpO2: 98% 100%    Recent laboratory studies:  Results for orders placed or performed during the hospital encounter of 08/28/17  Surgical pcr screen  Result Value Ref Range   MRSA, PCR NEGATIVE NEGATIVE   Staphylococcus aureus NEGATIVE NEGATIVE  CBC  Result Value Ref Range   WBC 7.1 4.0 - 10.5 K/uL   RBC 5.16 4.22 - 5.81 MIL/uL   Hemoglobin 14.0 13.0 - 17.0 g/dL   HCT 41.7 39.0 - 52.0 %   MCV 80.8 78.0 - 100.0 fL   MCH 27.1 26.0 - 34.0 pg   MCHC 33.6 30.0 - 36.0 g/dL   RDW 15.0 11.5 - 15.5 %   Platelets 258 150 - 400 K/uL  Comprehensive metabolic panel  Result Value Ref Range   Sodium 138 135 - 145 mmol/L   Potassium 4.7 3.5 - 5.1 mmol/L   Chloride 100 (L) 101 - 111 mmol/L   CO2 31 22 - 32 mmol/L   Glucose, Bld 84 65 - 99 mg/dL   BUN 5 (L) 6 - 20 mg/dL   Creatinine, Ser 0.96 0.61 - 1.24 mg/dL   Calcium 9.6 8.9 - 10.3 mg/dL   Total Protein 6.7 6.5 - 8.1 g/dL   Albumin 3.9 3.5 - 5.0 g/dL   AST 21 15 - 41 U/L   ALT 16 (L) 17 - 63 U/L   Alkaline Phosphatase 47 38 - 126 U/L   Total Bilirubin 0.5 0.3 - 1.2 mg/dL   GFR calc non Af Amer >60 >60 mL/min   GFR calc Af Amer >60 >60 mL/min   Anion gap 7 5 - 15  Protime-INR  Result Value Ref Range   Prothrombin Time 12.7 11.4 - 15.2 seconds   INR 0.96   APTT  Result Value Ref Range   aPTT 29 24 - 36 seconds  CBC  Result Value Ref Range   WBC 12.7 (H) 4.0 - 10.5 K/uL   RBC 4.48 4.22 - 5.81 MIL/uL   Hemoglobin 12.2 (L) 13.0 - 17.0 g/dL   HCT 35.9 (L) 39.0 - 52.0 %   MCV 80.1 78.0 - 100.0 fL   MCH 27.2 26.0 - 34.0 pg   MCHC 34.0 30.0 - 36.0 g/dL   RDW 15.2 11.5 - 15.5 %   Platelets 267 150 - 400 K/uL    Discharge Medications:   Allergies as of 08/31/2017      Reactions   Other Hives, Swelling   Antibiotic that was  given when he had pneumonia       Medication List    TAKE these medications   ALPRAZolam 0.25 MG tablet Commonly known as:  XANAX Take 0.25  mg 3 (three) times daily by mouth.   diphenhydrAMINE 25 MG tablet Commonly known as:  BENADRYL Take 25 mg at bedtime by mouth.   docusate sodium 100 MG capsule Commonly known as:  COLACE Take 1 capsule (100 mg total) 2 (two) times daily by mouth.   gabapentin 300 MG capsule Commonly known as:  NEURONTIN Take 300 mg 3 (three) times daily by mouth.   LUBRICANT EYE DROPS 0.4-0.3 % Soln Generic drug:  Polyethyl Glycol-Propyl Glycol Place 1-2 drops 3 (three) times daily as needed into both eyes (for dry/irritated eyes).   methocarbamol 500 MG tablet Commonly known as:  ROBAXIN Take 1 tablet (500 mg total) every 8 (eight) hours as needed by mouth for muscle spasms.   methylPREDNISolone 4 MG Tbpk tablet Commonly known as:  MEDROL DOSEPAK Take as directed.Start on day #3.   oxycodone 30 MG immediate release tablet Commonly known as:  ROXICODONE Take 1 tablet (30 mg total) by mouth 6 (six) times daily. What changed:  Another medication with the same name was added. Make sure you understand how and when to take each.   OXYCONTIN 20 mg 12 hr tablet Generic drug:  oxyCODONE Take 20 mg by mouth 2 (two) times daily. What changed:  Another medication with the same name was added. Make sure you understand how and when to take each.   oxycodone 30 MG immediate release tablet Commonly known as:  ROXICODONE Take 1 tablet (30 mg total) every 4 (four) hours as needed by mouth for severe pain. What changed:  You were already taking a medication with the same name, and this prescription was added. Make sure you understand how and when to take each.   oxyCODONE 20 mg 12 hr tablet Commonly known as:  OXYCONTIN Take 1 tablet (20 mg total) every 12 (twelve) hours by mouth. What changed:  You were already taking a medication with the same name, and this  prescription was added. Make sure you understand how and when to take each.            Durable Medical Equipment  (From admission, onward)        Start     Ordered   08/29/17 0854  For home use only DME Walker rolling  Once    Question:  Patient needs a walker to treat with the following condition  Answer:  Status post lumbar laminectomy   08/29/17 0854   08/28/17 1238  DME 3 n 1  Once     08/28/17 1237      Diagnostic Studies: Dg Lumbar Spine 2-3 Views  Result Date: 08/28/2017 CLINICAL DATA:  Localization radiographs prior lumbar surgery. EXAM: LUMBAR SPINE - 2-3 VIEW COMPARISON:  Lumbar MRI of July 08, 2017. FINDINGS: The image labeled "Film 1" demonstrates metallic needles projecting over the L4 spinous process and L5 spinous process. The image labeled "Film 2" demonstrates a tissue spreader device in the superficial soft tissues at the level of the L4-5 disc space with the metallic trocar projecting 1.8 cm posterior to the posterior margin of the L4-5 disc. IMPRESSION: Intraoperative localization radiographs with findings as described. Electronically Signed   By: David  Martinique M.D.   On: 08/28/2017 08:49    Disposition: 01-Home or Self Care    Follow-up Information    Jessy Oto, MD Follow up in 2 week(s).   Specialty:  Orthopedic Surgery Why:  For wound re-check Contact information: [place] [address] [phone]  Care, [facility] Follow up.   Specialty:  Home Health Services Why:  Home Health Physical Therapy and Occupational Therapy-agency will call to arrange initial appointment Contact information: [place] [address] [phone]            Signed: [name] 08/31/2017, 3:07 PM

## 2017-08-31 NOTE — Progress Notes (Signed)
     Subjective: 3 Days Post-Op Procedure(s) (LRB): BILATERAL PARTIAL HEMILAMINECTOMIES L4-5 WITH MICRODISCECTOMY (Bilateral) Awake alert and oriented x 4. I'm ready to go. Wife asks when will the weakness improve. Reflexes are normal, nerve roots are intact and there was no complication. I expect the weakness with improve over next 4-6 weeks. Likely it is due to peroperative swelling. Continue the steroid dose pak. Patient reports pain as moderate.  Patient reportedly was to be seen by Dr. Primus Bravo today in pain management, it is not likely that he will be able to make his appointment so I will contact Dr. Primus Bravo and inform him of the prescription for 2 more weeks of OxyIR 30mg  #90 one every 4 hours prn pain, and Oxycontin 20 mg #30 one every 12 hours.  Objective:   VITALS:  Temp:  [98.3 F (36.8 C)-98.8 F (37.1 C)] 98.8 F (37.1 C) (11/15 0606) Pulse Rate:  [66-83] 66 (11/15 0606) Resp:  [16-20] 18 (11/15 0606) BP: (110-137)/(64-81) 120/70 (11/15 0606) SpO2:  [96 %-98 %] 98 % (11/15 0606)  Neurologically intact ABD soft Neurovascular intact Sensation intact distally Intact pulses distally Dorsiflexion/Plantar flexion intact Incision: scant drainage No cellulitis present   LABS Recent Labs    08/29/17 0528  HGB 12.2*  WBC 12.7*  PLT 267   No results for input(s): NA, K, CL, CO2, BUN, CREATININE, GLUCOSE in the last 72 hours. No results for input(s): LABPT, INR in the last 72 hours.   Assessment/Plan: 3 Days Post-Op Procedure(s) (LRB): BILATERAL PARTIAL HEMILAMINECTOMIES L4-5 WITH MICRODISCECTOMY (Bilateral)  Advance diet Up with therapy Discharge home with home health  Jason [name] 08/31/2017, 8:43 AMPatient ID: Jason [name], male   DOB: [birth year], 36 y.o.   MRN: [id number]

## 2017-08-31 NOTE — Progress Notes (Signed)
Discharged home with family to have home heath services. Equipments deliveered to room prior to discharge. Significant other present at the time of D/C. Market researcher given and explained. All questions answered. Verbalized understanding. Francois Elk Ladora Daniel, BSN, RN

## 2017-09-02 ENCOUNTER — Emergency Department (HOSPITAL_COMMUNITY)
Admission: EM | Admit: 2017-09-02 | Discharge: 2017-09-02 | Disposition: A | Discharge status: Home / Self Care | Payer: Self-pay | Attending: Emergency Medicine | Admitting: Emergency Medicine

## 2017-09-02 ENCOUNTER — Emergency Department (HOSPITAL_COMMUNITY): Payer: Self-pay

## 2017-09-02 ENCOUNTER — Encounter (HOSPITAL_COMMUNITY): Payer: Self-pay | Admitting: Pharmacy Technician

## 2017-09-02 DIAGNOSIS — W01190A Fall on same level from slipping, tripping and stumbling with subsequent striking against furniture, initial encounter: Secondary | ICD-10-CM | POA: Insufficient documentation

## 2017-09-02 DIAGNOSIS — M5442 Lumbago with sciatica, left side: Secondary | ICD-10-CM | POA: Insufficient documentation

## 2017-09-02 DIAGNOSIS — F419 Anxiety disorder, unspecified: Secondary | ICD-10-CM | POA: Insufficient documentation

## 2017-09-02 DIAGNOSIS — F17200 Nicotine dependence, unspecified, uncomplicated: Secondary | ICD-10-CM | POA: Insufficient documentation

## 2017-09-02 DIAGNOSIS — D573 Sickle-cell trait: Secondary | ICD-10-CM | POA: Insufficient documentation

## 2017-09-02 DIAGNOSIS — Z79899 Other long term (current) drug therapy: Secondary | ICD-10-CM | POA: Insufficient documentation

## 2017-09-02 DIAGNOSIS — Y92009 Unspecified place in unspecified non-institutional (private) residence as the place of occurrence of the external cause: Secondary | ICD-10-CM

## 2017-09-02 DIAGNOSIS — W19XXXA Unspecified fall, initial encounter: Secondary | ICD-10-CM

## 2017-09-02 MED ORDER — MORPHINE SULFATE (PF) 4 MG/ML IV SOLN
4.0000 mg | INTRAVENOUS | Status: DC | PRN
  Administered 2017-09-02: 4 mg via INTRAVENOUS
  Filled 2017-09-02: qty 1

## 2017-09-02 MED ORDER — ONDANSETRON HCL 4 MG/2ML IJ SOLN
4.0000 mg | Freq: Once | INTRAMUSCULAR | Status: AC
  Administered 2017-09-02: 4 mg via INTRAVENOUS
  Filled 2017-09-02: qty 2

## 2017-09-02 MED ORDER — METHOCARBAMOL 500 MG PO TABS
1000.0000 mg | ORAL_TABLET | Freq: Once | ORAL | Status: AC
  Administered 2017-09-02: 1000 mg via ORAL
  Filled 2017-09-02: qty 2

## 2017-09-02 MED ORDER — KETOROLAC TROMETHAMINE 30 MG/ML IJ SOLN
30.0000 mg | Freq: Once | INTRAMUSCULAR | Status: AC
  Administered 2017-09-02: 30 mg via INTRAVENOUS
  Filled 2017-09-02: qty 1

## 2017-09-02 MED ORDER — METHOCARBAMOL 500 MG PO TABS: 500.0000 mg | ORAL_TABLET | Freq: Three times a day (TID) | ORAL | 0 refills | Status: DC | PRN

## 2017-09-02 NOTE — Discharge Instructions (Signed)
Follow-up with your surgeon as previously scheduled.  Continue your pain medicine regimen.  New prescription Robaxin-muscle relaxant

## 2017-09-02 NOTE — ED Notes (Signed)
Patient transported to X-ray 

## 2017-09-02 NOTE — ED Provider Notes (Signed)
Jason [name] Provider Note   CSN: [id number] Arrival date & time:        History   Chief Complaint Chief Complaint  Patient presents with  . Fall    HPI Jason [name] is a 36 y.o. male. Chief complaint is postop back surgery, fall back pain.  HPI: 36 year old male. He underwent L45 laminectomy and microdiscectomy for progressive radicular symptoms left greater than right. It well. Discharged 2 days ago. Did have some continued but improved left leg weakness noted by his surgeon. Was placed on Decadron at discharge. He has been more active at home and in the hospital. He has continued with some left leg weakness although he states his symptoms are better. He was up and around somewhat today. He took a nap and got up this afternoon his left leg was weak and he fell. He struck his left side of his back against his bed. Is able to get himself up and is having progressive pain. He is on chronic pain medicine regimen and follows with pain management. He had good symptom control prior to his episode today. No bowel or bladder symptoms. No radicular symptoms. No RLE sx. .  Past Medical History:  Diagnosis Date  . Anxiety   . Back injury   . Cervical disc disease   . Chronic back pain   . Chronic low back pain 03/29/2016  . GERD (gastroesophageal reflux disease)   . Insomnia   . Lumbar disc disease   . MIGRAINE, COMMON 09/23/2010   Qualifier: Diagnosis of  By: Jenny Reichmann MD, Hunt Oris   . Pneumonia   . Sickle cell trait [facility])     Patient Active Problem List   Diagnosis Date Noted  . S/P lumbar laminectomy 08/29/2017  . Spinal stenosis of lumbar region 08/28/2017    Class: Chronic  . Status post lumbar microdiscectomy 08/28/2017  . 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 Gross hematuria 04/01/2016  . Depression 04/01/2016  . Chronic low back pain 03/29/2016  . Cough 08/05/2015  .  Encounter for well adult exam with abnormal findings 02/17/2015  . Lumbar disc disease   . Cervical disc disease   . GERD (gastroesophageal reflux disease)   . Anxiety   . Insomnia   . CONSTIPATION 10/05/2010  . MIGRAINE, COMMON 09/23/2010  . Abdominal pain, other specified site 09/23/2010    Past Surgical History:  Procedure Laterality Date  . BILATERAL PARTIAL HEMILAMINECTOMIES L4-5 WITH MICRODISCECTOMY Bilateral 08/28/2017   Performed by Jessy Oto, MD at [place]  . NO PAST SURGERIES         Home Medications    Prior to Admission medications   Medication Sig Start Date End Date Taking? Authorizing Provider  ALPRAZolam (XANAX) 0.25 MG tablet Take 0.25 mg 3 (three) times daily by mouth.   Yes [provider]  diphenhydrAMINE (BENADRYL) 25 MG tablet Take 25 mg at bedtime by mouth.   Yes [provider]  gabapentin (NEURONTIN) 300 MG capsule Take 300 mg 3 (three) times daily by mouth. 06/05/17  Yes [provider]  methylPREDNISolone (MEDROL DOSEPAK) 4 MG TBPK tablet Take as directed.Start on day #3. 08/31/17  Yes Jessy Oto, MD  oxyCODONE (OXYCONTIN) 20 mg 12 hr tablet Take 1 tablet (20 mg total) every 12 (twelve) hours by mouth. 08/31/17  Yes Jessy Oto, MD  oxycodone (ROXICODONE) 30 MG immediate release tablet Take  1 tablet (30 mg total) by mouth 6 (six) times daily. 03/29/16  Yes Biagio Borg, MD  Polyethyl Glycol-Propyl Glycol (LUBRICANT EYE DROPS) 0.4-0.3 % SOLN Place 1-2 drops 3 (three) times daily as needed into both eyes (for dry/irritated eyes).   Yes [provider]  docusate sodium (COLACE) 100 MG capsule Take 1 capsule (100 mg total) 2 (two) times daily by mouth. Patient not taking: Reported on 09/02/2017 08/29/17   Jessy Oto, MD  methocarbamol (ROBAXIN) 500 MG tablet Take 1 tablet (500 mg total) 3 (three) times daily between meals as needed by mouth. 09/02/17   Tanna Furry, MD    Family History Family History    Problem Relation Age of Onset  . Congestive Heart Failure Father   . Hypertension Mother   . Hyperlipidemia Mother     Social History Social History   Tobacco Use  . Smoking status: Current Every Day Smoker    Packs/day: 1.00  . Smokeless tobacco: Never Used  Substance Use Topics  . Alcohol use: No    Frequency: Never    Comment: former use - heavy drinker 8 years ago  . Drug use: No     Allergies   Other   Review of Systems Review of Systems  Constitutional: Negative for appetite change, chills, diaphoresis, fatigue and fever.  HENT: Negative for mouth sores, sore throat and trouble swallowing.   Eyes: Negative for visual disturbance.  Respiratory: Negative for cough, chest tightness, shortness of breath and wheezing.   Cardiovascular: Negative for chest pain.  Gastrointestinal: Negative for abdominal distention, abdominal pain, diarrhea, nausea and vomiting.  Endocrine: Negative for polydipsia, polyphagia and polyuria.  Genitourinary: Negative for dysuria, frequency and hematuria.  Musculoskeletal: Positive for back pain and gait problem.  Skin: Negative for color change, pallor and rash.  Neurological: Positive for weakness. Negative for dizziness, syncope, light-headedness and headaches.  Hematological: Does not bruise/bleed easily.  Psychiatric/Behavioral: Negative for behavioral problems and confusion.     Physical Exam Updated Vital Signs BP (!) 126/98   Pulse (!) 57   Temp 98 F (36.7 C) (Oral)   Resp 16   SpO2 99%   Physical Exam  Constitutional: He is oriented to person, place, and time. He appears well-developed and well-nourished. No distress.  HENT:  Head: Normocephalic.  Eyes: Conjunctivae are normal. Pupils are equal, round, and reactive to light. No scleral icterus.  Neck: Normal range of motion. Neck supple. No thyromegaly present.  Cardiovascular: Normal rate and regular rhythm. Exam reveals no gallop and no friction rub.  No murmur  heard. Pulmonary/Chest: Effort normal and breath sounds normal. No respiratory distress. He has no wheezes. He has no rales.  Abdominal: Soft. Bowel sounds are normal. He exhibits no distension. There is no tenderness. There is no rebound.  Musculoskeletal: Normal range of motion.  Neurological: He is alert and oriented to person, place, and time.  Normla symmetric strength to flex/.extend hip and knees, dorsi/plantar flex ankles. Normal symmetric sensation to all distributions to LEs Patellar and achilles reflexes 1-2+. Downgoing Babinski   Skin: Skin is warm and dry. No rash noted.  Psychiatric: He has a normal mood and affect. His behavior is normal.     ED Treatments / Results  Labs (all labs ordered are listed, but only abnormal results are displayed) Labs Reviewed - No data to display  EKG  EKG Interpretation None       Radiology Dg Lumbar Spine Complete  Result Date: 09/02/2017 CLINICAL  DATA:  Left leg weakness resulting in fall today. Pain along the posterior left hip and left lumbar region. History of partial hemi laminectomies at L4-5 with microdiscectomy 5 days ago. EXAM: LUMBAR SPINE - COMPLETE 4+ VIEW COMPARISON:  03/22/2016 and intraoperative lumbar radiographs from 08/28/2017. FINDINGS: Normal lumbar segmentation and alignment. Slight disc space narrowing L4-5 and L5-S1 without pars defects. No acute fracture or suspicious osseous lesions. IMPRESSION: No acute osseous abnormality of the lumbar spine. Slight stable disc space narrowing L4-5 and L5-S1. Electronically Signed   By: Ashley Royalty M.D.   On: 09/02/2017 17:26    Procedures Procedures (including critical care time)  Medications Ordered in ED Medications  morphine 4 MG/ML injection 4 mg (4 mg Intravenous Given 09/02/17 1728)  ondansetron (ZOFRAN) injection 4 mg (4 mg Intravenous Given 09/02/17 1728)  methocarbamol (ROBAXIN) tablet 1,000 mg (1,000 mg Oral Given 09/02/17 1728)  ketorolac (TORADOL) 30 MG/ML  injection 30 mg (30 mg Intravenous Given 09/02/17 1728)     Initial Impression / Assessment and Plan / ED Course  I have reviewed the triage vital signs and the nursing notes.  Pertinent labs & imaging results that were available during my care of the patient were reviewed by me and considered in my medical decision making (see chart for details).    Plan sent control. Plain film x-rays rule out compression. No signs of neurological compromise. We'll reevaluate after imaging.  Final Clinical Impressions(s) / ED Diagnoses   Final diagnoses:  Fall in home, initial encounter  Acute left-sided low back pain with left-sided sciatica    X-ray show no acute abnormality's. Repeat neurological exam shows no change. Pain is improving per patient. He has family here to take him home. Plans discharge home. We'll add Robaxin as needed. Surgical follow-up as planned.  ED Discharge Orders        Ordered    methocarbamol (ROBAXIN) 500 MG tablet  3 times daily between meals PRN     09/02/17 1756       Tanna Furry, MD 09/02/17 1757

## 2017-09-02 NOTE — ED Triage Notes (Signed)
Pt arrives to the ED from home with reports of L flank pain post fall. Pt with surgery to L4 L5 last week and discharged home Thursday. Pt with weakness and sensory deficits to LLE causing fall today. Pt had deficits upon discharge Thursday. Pt given 173mcg fentanyl en route. VSS.

## 2017-09-19 ENCOUNTER — Other Ambulatory Visit (INDEPENDENT_AMBULATORY_CARE_PROVIDER_SITE_OTHER): Payer: Self-pay | Admitting: Specialist

## 2017-09-19 ENCOUNTER — Ambulatory Visit (INDEPENDENT_AMBULATORY_CARE_PROVIDER_SITE_OTHER): Payer: PRIVATE HEALTH INSURANCE | Admitting: Orthopaedic Surgery

## 2017-09-19 ENCOUNTER — Encounter (INDEPENDENT_AMBULATORY_CARE_PROVIDER_SITE_OTHER): Payer: Self-pay | Admitting: Orthopaedic Surgery

## 2017-09-19 VITALS — BP 135/86 | HR 84 | Ht 68.0 in | Wt 150.0 lb

## 2017-09-19 DIAGNOSIS — M5126 Other intervertebral disc displacement, lumbar region: Secondary | ICD-10-CM

## 2017-09-19 DIAGNOSIS — M5116 Intervertebral disc disorders with radiculopathy, lumbar region: Secondary | ICD-10-CM

## 2017-09-19 MED ORDER — CEPHALEXIN 500 MG PO CAPS: 500.0000 mg | ORAL_CAPSULE | Freq: Four times a day (QID) | ORAL | 0 refills | Status: DC

## 2017-09-19 NOTE — Progress Notes (Signed)
   Post-Op Visit Note   Patient: Jason [name]           Date of Birth: 1981/06/23           MRN: [id number] Visit Date: 09/19/2017 PCP: Biagio Borg, MD   Assessment & Plan:  Chief Complaint:  Chief Complaint  Patient presents with  . Lower Back - Wound Check   Visit Diagnoses:  1. 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  Patient will follow up with Dr. [name] for wound check.  Follow-Up Instructions: Return in about 2 weeks (around 10/05/2017) for Dr [name] for wound check..   Orders:  No orders of the defined types were placed in this encounter.  No orders of the defined types were placed in this encounter.   Imaging: No results found.  PMFS History: Patient Active Problem List   Diagnosis Date Noted  . S/P lumbar laminectomy 08/29/2017  . Spinal stenosis of lumbar region 08/28/2017    Class: Chronic  . Status post lumbar microdiscectomy 08/28/2017  . 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 Gross hematuria 04/01/2016  . Depression 04/01/2016  . Chronic low back pain 03/29/2016  . Cough 08/05/2015  . Encounter for well adult exam with abnormal findings 02/17/2015  . Lumbar disc disease   . Cervical disc disease   . GERD (gastroesophageal reflux disease)   . Anxiety   . Insomnia   . CONSTIPATION 10/05/2010  . MIGRAINE, COMMON 09/23/2010  . Abdominal pain, other specified site 09/23/2010   Past Medical History:  Diagnosis Date  . Anxiety   . Back injury   . Cervical disc disease   . Chronic back pain   . Chronic low back pain 03/29/2016  . GERD (gastroesophageal reflux disease)   . Insomnia   . Lumbar disc disease   . MIGRAINE, COMMON 09/23/2010   Qualifier: Diagnosis of  By: Jenny Reichmann MD, Hunt Oris   . Pneumonia   . Sickle cell trait ([facility])     Family History  Problem Relation Age of Onset  . Congestive Heart Failure Father   .  Hypertension Mother   . Hyperlipidemia Mother     Past Surgical History:  Procedure Laterality Date  . BACK SURGERY    . LUMBAR LAMINECTOMY/DECOMPRESSION MICRODISCECTOMY Bilateral 08/28/2017   Procedure: BILATERAL PARTIAL HEMILAMINECTOMIES L4-5 WITH MICRODISCECTOMY;  Surgeon: Jessy Oto, MD;  Location: [facility];  Service: Orthopedics;  Laterality: Bilateral;  . NO PAST SURGERIES     Social History   Occupational History  . Not on file  Tobacco Use  . Smoking status: Current Every Day Smoker    Packs/day: 1.00  . Smokeless tobacco: Never Used  Substance and Sexual Activity  . Alcohol use: No    Frequency: Never    Comment: former use - heavy drinker 8 years ago  . Drug use: No  . Sexual activity: Not on file

## 2017-09-21 ENCOUNTER — Other Ambulatory Visit: Payer: Self-pay

## 2017-09-21 ENCOUNTER — Encounter (HOSPITAL_COMMUNITY): Payer: Self-pay | Admitting: Emergency Medicine

## 2017-09-21 ENCOUNTER — Emergency Department (HOSPITAL_COMMUNITY)
Admission: EM | Admit: 2017-09-21 | Discharge: 2017-09-22 | Disposition: A | Discharge status: Home / Self Care | Payer: Self-pay | Attending: Emergency Medicine | Admitting: Emergency Medicine

## 2017-09-21 DIAGNOSIS — L03319 Cellulitis of trunk, unspecified: Secondary | ICD-10-CM | POA: Insufficient documentation

## 2017-09-21 DIAGNOSIS — L02219 Cutaneous abscess of trunk, unspecified: Secondary | ICD-10-CM | POA: Insufficient documentation

## 2017-09-21 DIAGNOSIS — F17210 Nicotine dependence, cigarettes, uncomplicated: Secondary | ICD-10-CM | POA: Insufficient documentation

## 2017-09-21 DIAGNOSIS — Z79899 Other long term (current) drug therapy: Secondary | ICD-10-CM | POA: Insufficient documentation

## 2017-09-21 MED ORDER — SULFAMETHOXAZOLE-TRIMETHOPRIM 800-160 MG PO TABS: 1.0000 | ORAL_TABLET | Freq: Two times a day (BID) | ORAL | 0 refills | Status: AC

## 2017-09-21 MED ORDER — NAPROXEN 375 MG PO TABS
375.0000 mg | ORAL_TABLET | Freq: Two times a day (BID) | ORAL | 0 refills | Status: DC
Start: 2017-09-21 — End: 2018-12-11

## 2017-09-21 NOTE — ED Triage Notes (Signed)
Patient reports skin abscess with drainage at mid abdomen onset last week , denies fever or chills .

## 2017-09-21 NOTE — ED Provider Notes (Signed)
[facility] EMERGENCY DEPARTMENT Provider Note   CSN: [id number] Arrival date & time: 09/21/17  2109     History   Chief Complaint Chief Complaint  Patient presents with  . Abscess    abdomen    HPI Enes Rokosz is a 36 y.o. male.  Avin Gibbons is a 36 y.o. Male who presents to the ED complaining of an abscess to his abdomen with onset several days ago. He reports he has pain, swelling and pus like discharge from the site of his lower abdomen ongoing for several days.  He reports he was pushing on the area earlier and expressed a large amount of pus.  No treatments attempted prior to arrival.  He reports pain around this area in his abdomen but otherwise no abdominal pain.  No nausea, vomiting or diarrhea.  No fevers.  He reports he has had several abscesses like this before around the same area.  He reports they have either gotten better with antibiotics or drainage.  He is not currently on any antibiotics.  He denies fevers, vomiting, diarrhea, or other complaints.   The history is provided by the patient and medical records. No language interpreter was used.  Abscess  Associated symptoms: no fever, no nausea and no vomiting     Past Medical History:  Diagnosis Date  . Anxiety   . Back injury   . Cervical disc disease   . Chronic back pain   . Chronic low back pain 03/29/2016  . GERD (gastroesophageal reflux disease)   . Insomnia   . Lumbar disc disease   . MIGRAINE, COMMON 09/23/2010   Qualifier: Diagnosis of  By: Jenny Reichmann MD, Hunt Oris   . Pneumonia   . Sickle cell trait [facility])     Patient Active Problem List   Diagnosis Date Noted  . S/P lumbar laminectomy 08/29/2017  . Spinal stenosis of lumbar region 08/28/2017    Class: Chronic  . Status post lumbar microdiscectomy 08/28/2017  . 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 Gross hematuria 04/01/2016  . Depression 04/01/2016  .  Chronic low back pain 03/29/2016  . Cough 08/05/2015  . Encounter for well adult exam with abnormal findings 02/17/2015  . Lumbar disc disease   . Cervical disc disease   . GERD (gastroesophageal reflux disease)   . Anxiety   . Insomnia   . CONSTIPATION 10/05/2010  . MIGRAINE, COMMON 09/23/2010  . Abdominal pain, other specified site 09/23/2010    Past Surgical History:  Procedure Laterality Date  . BACK SURGERY    . LUMBAR LAMINECTOMY/DECOMPRESSION MICRODISCECTOMY Bilateral 08/28/2017   Procedure: BILATERAL PARTIAL HEMILAMINECTOMIES L4-5 WITH MICRODISCECTOMY;  Surgeon: Jessy Oto, MD;  Location: [place];  Service: Orthopedics;  Laterality: Bilateral;  . NO PAST SURGERIES         Home Medications    Prior to Admission medications   Medication Sig Start Date End Date Taking? Authorizing Provider  ALPRAZolam (XANAX) 0.25 MG tablet Take 0.25 mg 3 (three) times daily by mouth.    [provider]  cephALEXin (KEFLEX) 500 MG capsule Take 1 capsule (500 mg total) by mouth 4 (four) times daily. 09/19/17   Jessy Oto, MD  diphenhydrAMINE (BENADRYL) 25 MG tablet Take 25 mg at bedtime by mouth.    [provider]  docusate sodium (COLACE) 100 MG capsule Take 1 capsule (100 mg total) 2 (two) times daily by mouth. Patient  not taking: Reported on 09/02/2017 08/29/17   Jessy Oto, MD  gabapentin (NEURONTIN) 300 MG capsule Take 300 mg 3 (three) times daily by mouth. 06/05/17   [provider]  methocarbamol (ROBAXIN) 500 MG tablet Take 1 tablet (500 mg total) 3 (three) times daily between meals as needed by mouth. 09/02/17   Tanna Furry, MD  methylPREDNISolone (MEDROL DOSEPAK) 4 MG TBPK tablet Take as directed.Start on day #3. 08/31/17   Jessy Oto, MD  naproxen (NAPROSYN) 375 MG tablet Take 1 tablet (375 mg total) by mouth 2 (two) times daily with a meal. 09/21/17   Waynetta Pean, PA-C  oxyCODONE (OXYCONTIN) 20 mg 12 hr tablet Take 1 tablet (20 mg total)  every 12 (twelve) hours by mouth. Patient not taking: Reported on 09/19/2017 08/31/17   Jessy Oto, MD  oxycodone (ROXICODONE) 30 MG immediate release tablet Take 1 tablet (30 mg total) by mouth 6 (six) times daily. 03/29/16   Biagio Borg, MD  Polyethyl Glycol-Propyl Glycol (LUBRICANT EYE DROPS) 0.4-0.3 % SOLN Place 1-2 drops 3 (three) times daily as needed into both eyes (for dry/irritated eyes).    [provider]  sulfamethoxazole-trimethoprim (BACTRIM DS,SEPTRA DS) 800-160 MG tablet Take 1 tablet by mouth 2 (two) times daily for 7 days. 09/21/17 09/28/17  Waynetta Pean, PA-C    Family History Family History  Problem Relation Age of Onset  . Congestive Heart Failure Father   . Hypertension Mother   . Hyperlipidemia Mother     Social History Social History   Tobacco Use  . Smoking status: Current Every Day Smoker    Packs/day: 1.00  . Smokeless tobacco: Never Used  Substance Use Topics  . Alcohol use: No    Frequency: Never    Comment: former use - heavy drinker 8 years ago  . Drug use: No     Allergies   Other   Review of Systems Review of Systems  Constitutional: Negative for chills and fever.  Gastrointestinal: Negative for diarrhea, nausea and vomiting.  Genitourinary: Negative for dysuria.  Musculoskeletal: Negative for back pain.  Skin: Positive for color change and wound.     Physical Exam Updated Vital Signs BP (!) 116/100 (BP Location: Right Arm)   Pulse 95   Temp 98.6 F (37 C) (Oral)   Resp 18   SpO2 100%   Physical Exam  Constitutional: He appears well-developed and well-nourished. No distress.  Nontoxic appearing.  HENT:  Head: Normocephalic and atraumatic.  Eyes: Right eye exhibits no discharge. Left eye exhibits no discharge.  Pulmonary/Chest: Effort normal. No respiratory distress.  Abdominal: Soft. He exhibits no distension. There is no guarding. No hernia.  About a 3 cm area of induration to his abdomen just to the right  of his umbilicus. Overlying erythema without surrounding or streaking erythema. Mild TTP overlying this area, but no other abdominal TTP.   Neurological: He is alert. Coordination normal.  Skin: Skin is warm and dry. Capillary refill takes less than 2 seconds. No rash noted. He is not diaphoretic. No pallor.  See abdominal   Psychiatric: He has a normal mood and affect. His behavior is normal.  Nursing note and vitals reviewed.    ED Treatments / Results  Labs (all labs ordered are listed, but only abnormal results are displayed) Labs Reviewed - No data to display  EKG  EKG Interpretation None       Radiology No results found.  Procedures Procedures (including critical care time)  EMERGENCY DEPARTMENT US SOFT TISSUE INTERPRETATION "Study: Limited Soft Tissue Ultrasound"  INDICATIONS: Soft tissue infection Multiple views of the body part were obtained in real-time with a multi-frequency linear probe  PERFORMED BY: Myself IMAGES ARCHIVED?: Yes SIDE:Right  BODY PART:Abdominal wall INTERPRETATION:  Abcess present and Cellulitis present     Medications Ordered in ED Medications - No data to display   Initial Impression / Assessment and Plan / ED Course  I have reviewed the triage vital signs and the nursing notes.  Pertinent labs & imaging results that were available during my care of the patient were reviewed by me and considered in my medical decision making (see chart for details).   This is a 36 y.o. Male who presents to the ED complaining of an abscess to his abdomen with onset several days ago. He reports he has pain, swelling and pus like discharge from the site of his lower abdomen ongoing for several days.  He reports he was pushing on the area earlier and expressed a large amount of pus.  No treatments attempted prior to arrival.  He reports pain around this area in his abdomen but otherwise no abdominal pain.  No nausea, vomiting or diarrhea.  No fevers.  He  reports he has had several abscesses like this before around the same area.  He reports they have either gotten better with antibiotics or drainage.  On exam the patient is afebrile nontoxic-appearing.  He has about a 3 cm area of induration noted to his abdomen just to the right of his umbilicus.  There is overlying erythema without surrounding or streaking erythema.  On ultrasound there appears to be cellulitis and possibly a small fluid collection. The abscess appears superficial.  Patient is then squeezing the area and obtain some pus.  I also unable to express some pus with palpation of the area.  No fluctuance noted.  Small amount of pus is obtained after ultrasound.  I discussed doing an incision and drainage at the bedside to express any remaining pus.  Patient declines. The abscess is slightly open and I am unable to express any further pus with palpation.  Will do Bactrim and I encouraged warm soaks and wound care.  Strict and specific return precautions discussed. I advised the patient to follow-up with their primary care provider this week. I advised the patient to return to the emergency department with new or worsening symptoms or new concerns. The patient verbalized understanding and agreement with plan.    Final Clinical Impressions(s) / ED Diagnoses   Final diagnoses:  Cellulitis and abscess of trunk    ED Discharge Orders        Ordered    sulfamethoxazole-trimethoprim (BACTRIM DS,SEPTRA DS) 800-160 MG tablet  2 times daily     09/21/17 2343    naproxen (NAPROSYN) 375 MG tablet  2 times daily with meals     09/21/17 2343       Waynetta Pean, PA-C 09/21/17 2352    Ezequiel Essex, MD 09/22/17 [phone]

## 2017-09-28 ENCOUNTER — Ambulatory Visit (INDEPENDENT_AMBULATORY_CARE_PROVIDER_SITE_OTHER): Payer: PRIVATE HEALTH INSURANCE | Admitting: Specialist

## 2017-10-03 ENCOUNTER — Encounter (INDEPENDENT_AMBULATORY_CARE_PROVIDER_SITE_OTHER): Payer: Self-pay | Admitting: Orthopaedic Surgery

## 2017-10-28 ENCOUNTER — Ambulatory Visit: Payer: Self-pay | Admitting: Internal Medicine

## 2017-10-30 ENCOUNTER — Telehealth (INDEPENDENT_AMBULATORY_CARE_PROVIDER_SITE_OTHER): Payer: Self-pay | Admitting: Radiology

## 2017-10-30 ENCOUNTER — Ambulatory Visit (INDEPENDENT_AMBULATORY_CARE_PROVIDER_SITE_OTHER): Payer: PRIVATE HEALTH INSURANCE | Admitting: Specialist

## 2017-10-30 NOTE — Telephone Encounter (Signed)
I called and lmom for patient to let him know that we need to see him back for postop care.  I advised that we have only seen him one time since surgery.  I advised him to call us back to sched a follow up appt with either Jeneen Rinks or Dr. [name]

## 2017-10-30 NOTE — Telephone Encounter (Signed)
Just now saw this message, I have him scheduled with Dr. [name] for 02/13, if he needs to be seen sooner send me another message and I can look at [facility] schedule. Or either patient wanted to be put on a cancellation list.

## 2017-10-30 NOTE — Telephone Encounter (Signed)
I placed him on cancellation list

## 2017-10-30 NOTE — Telephone Encounter (Signed)
error 

## 2017-11-06 ENCOUNTER — Other Ambulatory Visit: Payer: Self-pay

## 2017-11-06 ENCOUNTER — Encounter (HOSPITAL_COMMUNITY): Payer: Self-pay | Admitting: Emergency Medicine

## 2017-11-06 ENCOUNTER — Ambulatory Visit (HOSPITAL_COMMUNITY)
Admission: EM | Admit: 2017-11-06 | Discharge: 2017-11-06 | Disposition: A | Discharge status: Home / Self Care | Payer: BLUE CROSS/BLUE SHIELD | Attending: Family Medicine | Admitting: Family Medicine

## 2017-11-06 DIAGNOSIS — M79661 Pain in right lower leg: Secondary | ICD-10-CM
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
## 2017-11-06 NOTE — Discharge Instructions (Signed)
Please return if symptoms not improving in 1 week. Also return if symptoms worsening, increased swelling or pain in the meantime.  I expect slow gradual improvement over the next 1-2 weeks.  Continue pain medicine/anti-inflammatories, Alternate ice and heat.

## 2017-11-07 NOTE — ED Provider Notes (Signed)
[place]    CSN: [id number] Arrival date & time: 11/06/17  1400     History   Chief Complaint Chief Complaint  Patient presents with  . Leg Pain    HPI Jason [name] is a 38 y.o. male presenting with calf pain. He fell 2 days ago in [employer]- slipped on a puddle and fell backwards. Since he has had pain in right calf/lower leg. He had back surgery recently in November, denies back pain, states this is different from when his back pain would radiate into his legs. Denies numbness, tingling. Pain 7/10. Denies SOB, cough. Pain causing a limp. Denies popping sensation.  HPI  Past Medical History:  Diagnosis Date  . Anxiety   . Back injury   . Cervical disc disease   . Chronic back pain   . Chronic low back pain 03/29/2016  . GERD (gastroesophageal reflux disease)   . Insomnia   . Lumbar disc disease   . MIGRAINE, COMMON 09/23/2010   Qualifier: Diagnosis of  By: Jenny Reichmann MD, Hunt Oris   . Pneumonia   . Sickle cell trait [facility])     Patient Active Problem List   Diagnosis Date Noted  . S/P lumbar laminectomy 08/29/2017  . Spinal stenosis of lumbar region 08/28/2017    Class: Chronic  . Status post lumbar microdiscectomy 08/28/2017  . 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 Gross hematuria 04/01/2016  . Depression 04/01/2016  . Chronic low back pain 03/29/2016  . Cough 08/05/2015  . Encounter for well adult exam with abnormal findings 02/17/2015  . Lumbar disc disease   . Cervical disc disease   . GERD (gastroesophageal reflux disease)   . Anxiety   . Insomnia   . CONSTIPATION 10/05/2010  . MIGRAINE, COMMON 09/23/2010  . Abdominal pain, other specified site 09/23/2010    Past Surgical History:  Procedure Laterality Date  . BACK SURGERY    . LUMBAR LAMINECTOMY/DECOMPRESSION MICRODISCECTOMY Bilateral 08/28/2017   Procedure: BILATERAL PARTIAL HEMILAMINECTOMIES L4-5 WITH MICRODISCECTOMY;   Surgeon: Jessy Oto, MD;  Location: [place];  Service: Orthopedics;  Laterality: Bilateral;  . NO PAST SURGERIES         Home Medications    Prior to Admission medications   Medication Sig Start Date End Date Taking? Authorizing Provider  ALPRAZolam (XANAX) 0.25 MG tablet Take 0.25 mg 3 (three) times daily by mouth.    [provider]  cephALEXin (KEFLEX) 500 MG capsule Take 1 capsule (500 mg total) by mouth 4 (four) times daily. 09/19/17   Jessy Oto, MD  diphenhydrAMINE (BENADRYL) 25 MG tablet Take 25 mg at bedtime by mouth.    [provider]  docusate sodium (COLACE) 100 MG capsule Take 1 capsule (100 mg total) 2 (two) times daily by mouth. Patient not taking: Reported on 09/02/2017 08/29/17   Jessy Oto, MD  gabapentin (NEURONTIN) 300 MG capsule Take 300 mg 3 (three) times daily by mouth. 06/05/17   [provider]  methocarbamol (ROBAXIN) 500 MG tablet Take 1 tablet (500 mg total) 3 (three) times daily between meals as needed by mouth. 09/02/17   Tanna Furry, MD  methylPREDNISolone (MEDROL DOSEPAK) 4 MG TBPK tablet Take as directed.Start on day #3. 08/31/17   Jessy Oto, MD  naproxen (NAPROSYN) 375 MG tablet Take 1 tablet (375 mg total) by mouth 2 (two) times daily with a meal. 09/21/17   Waynetta Pean, PA-C  oxyCODONE (OXYCONTIN) 20 mg 12 hr tablet Take 1 tablet (20 mg total) every 12 (twelve) hours by mouth. Patient not taking: Reported on 09/19/2017 08/31/17   Jessy Oto, MD  oxycodone (ROXICODONE) 30 MG immediate release tablet Take 1 tablet (30 mg total) by mouth 6 (six) times daily. 03/29/16   Biagio Borg, MD  Polyethyl Glycol-Propyl Glycol (LUBRICANT EYE DROPS) 0.4-0.3 % SOLN Place 1-2 drops 3 (three) times daily as needed into both eyes (for dry/irritated eyes).    [provider]    Family History Family History  Problem Relation Age of Onset  . Congestive Heart Failure Father   . Hypertension Mother   . Hyperlipidemia  Mother     Social History Social History   Tobacco Use  . Smoking status: Current Every Day Smoker    Packs/day: 1.00  . Smokeless tobacco: Never Used  Substance Use Topics  . Alcohol use: No    Frequency: Never    Comment: former use - heavy drinker 8 years ago  . Drug use: No     Allergies   Other   Review of Systems Review of Systems  Constitutional: Negative for fever.  Musculoskeletal: Positive for gait problem and myalgias. Negative for back pain and joint swelling.  Skin: Negative for color change, pallor and wound.  Neurological: Negative for dizziness, weakness, light-headedness, numbness and headaches.     Physical Exam Triage Vital Signs ED Triage Vitals  Enc Vitals Group     BP 11/06/17 1457 107/68     Pulse Rate 11/06/17 1457 76     Resp 11/06/17 1457 16     Temp 11/06/17 1457 98.3 F (36.8 C)     Temp src --      SpO2 11/06/17 1457 100 %     Weight --      Height --      Head Circumference --      Peak Flow --      Pain Score 11/06/17 1459 7     Pain Loc --      Pain Edu? --      Excl. in [place]? --    No data found.  Updated Vital Signs BP 107/68   Pulse 76   Temp 98.3 F (36.8 C)   Resp 16   SpO2 100%    Physical Exam  Constitutional: He is oriented to person, place, and time. He appears well-developed and well-nourished. No distress.  HENT:  Head: Normocephalic and atraumatic.  Cardiovascular: Normal rate.  Pulmonary/Chest: Effort normal. No respiratory distress.  Musculoskeletal: He exhibits tenderness. He exhibits no edema or deformity.  Mild tenderness to palpation of right calf into achilles- does not appear to be more swollen or erythematous compared to right. No tenderness to palpation along antrior shin/tibia or lateral/fibula. No palpable cord. Full active ROM of knee and ankle.   Antalgic gait.   Neurological: He is alert and oriented to person, place, and time.     UC Treatments / Results  Labs (all labs ordered are  listed, but only abnormal results are displayed) Labs Reviewed - No data to display  EKG  EKG Interpretation None       Radiology No results found.  Procedures Procedures (including critical care time)  Medications Ordered in UC Medications - No data to display   Initial Impression / Assessment and Plan / UC Course  I have reviewed the triage vital signs and the nursing notes.  Pertinent labs & imaging  results that were available during my care of the patient were reviewed by me and considered in my medical decision making (see chart for details).     Likely calf strain/contusion with possible achilles tendonitis. Unlikely DVT given fall, no asymmetrical swelling. Imagine deferred palpation along tibia/fibula non tender. Full ROM at knee and ankle. Conservative management with pain/anti-inflammatories. Discussed strict return precautions. Patient verbalized understanding and is agreeable with plan.   Final Clinical Impressions(s) / UC Diagnoses   Final diagnoses:  Right calf pain    ED Discharge Orders    None       Controlled Substance Prescriptions [place] Controlled Substance Registry consulted? Not Applicable   Janith Lima, [place] 11/07/17 [phone]

## 2017-11-29 ENCOUNTER — Ambulatory Visit (INDEPENDENT_AMBULATORY_CARE_PROVIDER_SITE_OTHER): Payer: BLUE CROSS/BLUE SHIELD | Admitting: Specialist

## 2017-11-29 ENCOUNTER — Encounter (INDEPENDENT_AMBULATORY_CARE_PROVIDER_SITE_OTHER): Payer: Self-pay | Admitting: Specialist

## 2017-11-29 ENCOUNTER — Ambulatory Visit (INDEPENDENT_AMBULATORY_CARE_PROVIDER_SITE_OTHER): Payer: BLUE CROSS/BLUE SHIELD

## 2017-11-29 DIAGNOSIS — Z9889 Other specified postprocedural states: Secondary | ICD-10-CM

## 2017-11-29 DIAGNOSIS — G8929 Other chronic pain: Secondary | ICD-10-CM

## 2017-11-29 DIAGNOSIS — M5442 Lumbago with sciatica, left side: Secondary | ICD-10-CM | POA: Diagnosis not present

## 2017-11-29 MED ORDER — METHOCARBAMOL 500 MG PO TABS: 500.0000 mg | ORAL_TABLET | Freq: Three times a day (TID) | ORAL | 0 refills | Status: DC | PRN

## 2017-11-29 NOTE — Patient Instructions (Addendum)
Plan:Avoid frequent bending and stooping  No lifting greater than 10 lbs. May use ice or moist heat for pain. Weight loss is of benefit. Will order EMG/NCV of the legs due to the acute weakness of 3 weeks duration to assess for any changes. Physical Therapy,McKenzie exercises, LE strengthening, Hamstring stretching.

## 2017-11-29 NOTE — Progress Notes (Signed)
Post-Op Visit Note   Patient: Jason [name]           Date of Birth: August 06, 1981           MRN: [id number] Visit Date: 11/29/2017 PCP: Biagio Borg, MD   Assessment & Plan:3 months post bilateral L4-5 partial hemilaminectomies.   Chief Complaint:  Chief Complaint  Patient presents with  . Lower Back - Follow-up   Visit Diagnoses:  1. S/P lumbar microdiscectomy   2. Chronic low back pain with left-sided sciatica, unspecified back pain laterality   Reports a fall 3 weeks ago in a [employer] store when he slipped on an area of wet floor.  He was seen at [facility] at [facility] and was evaluated.   He has no volitional motor in left foot DF today, Plantar flexion is also decreased. Ankle reflexes are symmetric and normal. Knee reflexes are normal.  Plain radiographs with mild DDD L4-5, minimal retrolisthesis L4-5, no fracture or dislocation.  Calf circumferences are equal and thigh circumferences are equal.   Plan:Avoid frequent bending and stooping  No lifting greater than 10 lbs. May use ice or moist heat for pain. Weight loss is of benefit. Will order EMG/NCV of the legs due to the acute weakness of 3 weeks duration to assess for any changes. Physical Therapy,McKenzie exercises, LE strengthening, Hamstring stretching.    Follow-Up Instructions: No Follow-up on file.   Orders:  Orders Placed This Encounter  Procedures  . XR Lumbar Spine 2-3 Views   No orders of the defined types were placed in this encounter.   Imaging: No results found.  PMFS History: Patient Active Problem List   Diagnosis Date Noted  . Spinal stenosis of lumbar region 08/28/2017    Priority: High    Class: Chronic  . 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 S/P lumbar laminectomy 08/29/2017  . Status post lumbar microdiscectomy 08/28/2017  . Gross hematuria 04/01/2016  . Depression 04/01/2016  .  Chronic low back pain 03/29/2016  . Cough 08/05/2015  . Encounter for well adult exam with abnormal findings 02/17/2015  . Lumbar disc disease   . Cervical disc disease   . GERD (gastroesophageal reflux disease)   . Anxiety   . Insomnia   . CONSTIPATION 10/05/2010  . MIGRAINE, COMMON 09/23/2010  . Abdominal pain, other specified site 09/23/2010   Past Medical History:  Diagnosis Date  . Anxiety   . Back injury   . Cervical disc disease   . Chronic back pain   . Chronic low back pain 03/29/2016  . GERD (gastroesophageal reflux disease)   . Insomnia   . Lumbar disc disease   . MIGRAINE, COMMON 09/23/2010   Qualifier: Diagnosis of  By: Jenny Reichmann MD, Hunt Oris   . Pneumonia   . Sickle cell trait (HCC)     Family History  Problem Relation Age of Onset  . Congestive Heart Failure Father   . Hypertension Mother   . Hyperlipidemia Mother     Past Surgical History:  Procedure Laterality Date  . BACK SURGERY    . LUMBAR LAMINECTOMY/DECOMPRESSION MICRODISCECTOMY Bilateral 08/28/2017   Procedure: BILATERAL PARTIAL HEMILAMINECTOMIES L4-5 WITH MICRODISCECTOMY;  Surgeon: Jessy Oto, MD;  Location: [place];  Service: Orthopedics;  Laterality: Bilateral;  . NO PAST SURGERIES     Social History   Occupational History  . Not on file  Tobacco  Use  . Smoking status: Current Every Day Smoker    Packs/day: 1.00  . Smokeless tobacco: Never Used  Substance and Sexual Activity  . Alcohol use: No    Frequency: Never    Comment: former use - heavy drinker 8 years ago  . Drug use: No  . Sexual activity: Not on file

## 2017-12-27 ENCOUNTER — Ambulatory Visit: Payer: BLUE CROSS/BLUE SHIELD | Attending: Specialist | Admitting: Physical Therapy

## 2017-12-29 ENCOUNTER — Ambulatory Visit (INDEPENDENT_AMBULATORY_CARE_PROVIDER_SITE_OTHER): Payer: Self-pay | Admitting: Specialist

## 2018-03-15 ENCOUNTER — Telehealth: Payer: Self-pay | Admitting: Internal Medicine

## 2018-03-15 ENCOUNTER — Ambulatory Visit (INDEPENDENT_AMBULATORY_CARE_PROVIDER_SITE_OTHER): Payer: Self-pay | Admitting: Internal Medicine

## 2018-03-15 ENCOUNTER — Ambulatory Visit (INDEPENDENT_AMBULATORY_CARE_PROVIDER_SITE_OTHER): Payer: BLUE CROSS/BLUE SHIELD | Admitting: Surgery

## 2018-03-15 ENCOUNTER — Ambulatory Visit (INDEPENDENT_AMBULATORY_CARE_PROVIDER_SITE_OTHER): Payer: Self-pay

## 2018-03-15 ENCOUNTER — Encounter: Payer: Self-pay | Admitting: Internal Medicine

## 2018-03-15 VITALS — BP 126/85 | HR 58 | Ht 68.0 in | Wt 149.0 lb

## 2018-03-15 VITALS — BP 116/80 | HR 90 | Temp 98.7°F | Ht 68.0 in | Wt 145.0 lb

## 2018-03-15 DIAGNOSIS — M545 Low back pain, unspecified: Secondary | ICD-10-CM

## 2018-03-15 DIAGNOSIS — M542 Cervicalgia: Secondary | ICD-10-CM

## 2018-03-15 DIAGNOSIS — G8929 Other chronic pain: Secondary | ICD-10-CM

## 2018-03-15 MED ORDER — METHYLPREDNISOLONE 4 MG PO TABS: ORAL_TABLET | ORAL | 0 refills | Status: DC

## 2018-03-15 MED ORDER — OXYCODONE HCL 30 MG PO TABS: ORAL_TABLET | ORAL | 0 refills | Status: DC

## 2018-03-15 NOTE — Patient Instructions (Signed)
You were refilled x 17 days for the oxycodone  You will be contacted regarding the referral for: pain management   You should still see the old pain management as you mentioned as the new referral can take several months  I cannot refill further medication after this  Please continue all other medications as before, and refills have been done if requested.  Please have the pharmacy call with any other refills you may need.  Please keep your appointments with your specialists as you may have planned
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
## 2018-03-15 NOTE — Telephone Encounter (Signed)
Copied from [place] [id number]. Topic: Quick Communication - See Telephone Encounter >> Mar 15, 2018  5:45 PM Rutherford Nail, NT wrote: CRM for notification. See Telephone encounter for: 03/15/18. Patient calling and states that the pharmacy states that someone at the office needs to call and verify the prescription oxycodone (ROXICODONE) 30 MG immediate release tablet. Please advise.  CB#: [phone]
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
## 2018-03-16 ENCOUNTER — Encounter: Payer: Self-pay | Admitting: Internal Medicine

## 2018-03-16 NOTE — Telephone Encounter (Signed)
John, pharmacist, doesn't feel comfortable dispensing the medication. He would like to know why the oxycodone is being increased to 30mg  6 times a day when he has previously taking 15mg  4 times PRN daily prescribed by Dr. Ileana Roup. He stated "I don't want the kid to overdose". I told him that I understood and would check with PCP to verify if that is what he wants.   I also double check the database after checking his chart and seeing a lot of oxycodone (roxicodone), oxycodone (oxycotin), and oxycondone-acetaminophen (percocet) prescriptions. I have printed out the list form the database and placed on PCP desk to review.

## 2018-03-16 NOTE — Telephone Encounter (Signed)
Database checked, and appears pt stated an untruth about his oxycodone dosing at time of OV, c/w drug seeking behavior  This necessarily will result in patient dismissal

## 2018-03-16 NOTE — Telephone Encounter (Signed)
Pharmacist informed not to fill prescription.   Dismissal form places on Managers desk.
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
## 2018-03-17 NOTE — Assessment & Plan Note (Signed)
Med refilled to next planned pain clinic refill, no further refills from this office, will plan to refer pt to new pain management at his request as he wishes to transition

## 2018-03-19 ENCOUNTER — Telehealth: Payer: Self-pay | Admitting: Internal Medicine

## 2018-03-19 NOTE — Telephone Encounter (Signed)
Dismissal letter/form sent to [facility] for processing.

## 2018-03-19 NOTE — Telephone Encounter (Signed)
Dismissal letter and form sent to [facility].

## 2018-03-20 ENCOUNTER — Telehealth: Payer: Self-pay | Admitting: Internal Medicine

## 2018-03-20 NOTE — Telephone Encounter (Signed)
Patient dismissed from [facility] by Cathlean Cower MD, effective Mar 16, 2018. Dismissal letter sent out by certified / registered mail.  daj

## 2018-03-27 NOTE — Telephone Encounter (Signed)
Received signed domestic return receipt verifying delivery of certified letter on March 23, 2018 . Article number [id number] [id number]

## 2018-04-16 ENCOUNTER — Encounter (INDEPENDENT_AMBULATORY_CARE_PROVIDER_SITE_OTHER): Payer: Self-pay | Admitting: Specialist

## 2018-04-16 ENCOUNTER — Ambulatory Visit (INDEPENDENT_AMBULATORY_CARE_PROVIDER_SITE_OTHER): Payer: BLUE CROSS/BLUE SHIELD | Admitting: Specialist

## 2018-04-16 ENCOUNTER — Ambulatory Visit (INDEPENDENT_AMBULATORY_CARE_PROVIDER_SITE_OTHER): Payer: Self-pay

## 2018-04-16 VITALS — BP 128/78 | HR 106 | Ht 68.0 in | Wt 149.0 lb

## 2018-04-16 DIAGNOSIS — M25551 Pain in right hip: Secondary | ICD-10-CM

## 2018-04-16 DIAGNOSIS — M25552 Pain in left hip: Secondary | ICD-10-CM | POA: Diagnosis not present

## 2018-04-16 DIAGNOSIS — M5442 Lumbago with sciatica, left side: Secondary | ICD-10-CM

## 2018-04-16 MED ORDER — METHOCARBAMOL 500 MG PO TABS: 500.0000 mg | ORAL_TABLET | Freq: Three times a day (TID) | ORAL | 0 refills | Status: DC | PRN

## 2018-04-16 NOTE — Patient Instructions (Addendum)
Avoid frequent bending and stooping  No lifting greater than 10 lbs. May use ice or moist heat for pain. Weight loss is of benefit. Handicap license is approved. MRI of the lumbar spine orderes.
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
## 2018-05-05 ENCOUNTER — Ambulatory Visit
Admission: RE | Admit: 2018-05-05 | Discharge: 2018-05-05 | Disposition: A | Discharge status: Home / Self Care | Payer: Self-pay | Source: Ambulatory Visit | Attending: Specialist | Admitting: Specialist

## 2018-05-05 DIAGNOSIS — M25552 Pain in left hip: Principal | ICD-10-CM

## 2018-05-05 DIAGNOSIS — M5442 Lumbago with sciatica, left side: Secondary | ICD-10-CM

## 2018-05-05 DIAGNOSIS — M25551 Pain in right hip: Secondary | ICD-10-CM

## 2018-05-05 MED ORDER — GADOBENATE DIMEGLUMINE 529 MG/ML IV SOLN
14.0000 mL | Freq: Once | INTRAVENOUS | Status: AC | PRN
  Administered 2018-05-05: 14 mL via INTRAVENOUS

## 2018-05-31 ENCOUNTER — Encounter (INDEPENDENT_AMBULATORY_CARE_PROVIDER_SITE_OTHER): Payer: Self-pay | Admitting: Specialist

## 2018-05-31 ENCOUNTER — Ambulatory Visit (INDEPENDENT_AMBULATORY_CARE_PROVIDER_SITE_OTHER): Payer: BLUE CROSS/BLUE SHIELD | Admitting: Specialist

## 2018-05-31 VITALS — BP 113/74 | HR 59 | Ht 68.0 in | Wt 149.0 lb

## 2018-05-31 DIAGNOSIS — M5136 Other intervertebral disc degeneration, lumbar region: Secondary | ICD-10-CM | POA: Diagnosis not present

## 2018-05-31 DIAGNOSIS — M5116 Intervertebral disc disorders with radiculopathy, lumbar region: Secondary | ICD-10-CM

## 2018-05-31 DIAGNOSIS — M5126 Other intervertebral disc displacement, lumbar region: Secondary | ICD-10-CM | POA: Diagnosis not present

## 2018-05-31 NOTE — Progress Notes (Signed)
Office Visit Note   Patient: Jason [name]           Date of Birth: 06/18/1981           MRN: [id number] Visit Date: 05/31/2018              Requested by: Biagio Borg, MD [place] [place], [place] [address] PCP: Patient, No Pcp Per   Assessment & Plan: Visit Diagnoses:  1. Degenerative disc disease, lumbar   2. 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 Avoid prolong standing and walking. Order for a new walker with wheels. Surgery scheduling secretary Kandice Hams, will call you in the next week to schedule for surgery.  Surgery recommended is a two level lumbar fusion L5-S1 and L4-5 this would be done with rods, screws and cages with local bone graft and allograft (donor bone graft). Take hydrocodone for for pain. Risk of surgery includes risk of infection 1 in 300 patients, bleeding 2-3% chance you would need a transfusion.   Risk to the nerves is one in 10,000. You will need to use a brace for 3 months and wean from the brace on the 4th month. Expect improved walking and standing tolerance. Expect relief of leg pain but numbness may persist depending on the length and degree of pressure that has been present.  Follow-Up Instructions: Return in about 4 weeks (around 06/28/2018).   Orders:  No orders of the defined types were placed in this encounter.  No orders of the defined types were placed in this encounter.     Procedures: No procedures performed   Clinical Data: No additional findings.   Subjective: Chief Complaint  Patient presents with  . Right Hip - Follow-up  . Left Hip - Follow-up    37 year old male seen today with ongoing bilateral hip pain and numbness and tingling into the backs of the thighs and calves. No worse with bending or stooping. Some times with walking and standing he experiences increasing pain in the legs.  Had trouble getting out of a chair when trying to stand, the last time was last week. No bowel or bladder  Problems. Cough or sneeze is not painful. Some weakness in the legs, he almost lost his balance a couple of days ago.    Review of Systems  Constitutional: Negative.   HENT: Negative.   Eyes: Negative.   Respiratory: Negative.   Cardiovascular: Negative.   Gastrointestinal: Negative.   Endocrine: Negative.   Genitourinary: Negative.   Musculoskeletal: Negative.   Skin: Negative.   Allergic/Immunologic: Negative.   Neurological: Negative.   Hematological: Negative.   Psychiatric/Behavioral: Negative.      Objective: Vital Signs: BP 113/74 (BP Location: Left Arm, Patient Position: Sitting)   Pulse (!) 59   Ht 5\' 8"  (1.727 m)   Wt 149 lb (67.6 kg)   BMI 22.66 kg/m   Physical Exam  Constitutional: He is oriented to person, place, and time. He appears well-developed and well-nourished.  HENT:  Head: Normocephalic and atraumatic.  Eyes: Pupils are equal, round, and reactive to light. EOM are normal.  Neck: Normal range of motion. Neck supple.  Pulmonary/Chest: Effort normal and breath sounds normal.  Abdominal: Soft. Bowel sounds are normal.  Neurological: He is alert and oriented to person, place, and time.  Skin: Skin is warm and dry.  Psychiatric: He has  a normal mood and affect. His behavior is normal. Judgment and thought content normal.    Back Exam   Tenderness  The patient is experiencing tenderness in the lumbar.  Range of Motion  Extension:  20 abnormal  Flexion: abnormal  Lateral bend right: normal  Lateral bend left: normal  Rotation right: normal  Rotation left: normal   Muscle Strength  Right Quadriceps:  5/5  Left Quadriceps:  5/5  Right Hamstrings:  5/5  Left Hamstrings:  5/5   Tests  Straight leg raise right: negative Straight leg raise left: negative  Reflexes  Patellar: normal Achilles: normal Babinski's sign: normal   Other    Toe walk: normal Heel walk: normal Sensation: normal Gait: normal  Erythema: no back redness Scars: present      Specialty Comments:  Patient needs to verify address.  Imaging: No results found.   PMFS History: Patient Active Problem List   Diagnosis Date Noted  . Spinal stenosis of lumbar region 08/28/2017    Priority: High    Class: Chronic  . 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 S/P lumbar laminectomy 08/29/2017  . Status post lumbar microdiscectomy 08/28/2017  . Gross hematuria 04/01/2016  . Depression 04/01/2016  . Chronic low back pain 03/29/2016  . Cough 08/05/2015  . Encounter for well adult exam with abnormal findings 02/17/2015  . Lumbar disc disease   . Cervical disc disease   . GERD (gastroesophageal reflux disease)   . Anxiety   . Insomnia   . CONSTIPATION 10/05/2010  . MIGRAINE, COMMON 09/23/2010  . Abdominal pain, other specified site 09/23/2010   Past Medical History:  Diagnosis Date  . Anxiety   . Back injury   . Cervical disc disease   . Chronic back pain   . Chronic low back pain 03/29/2016  . GERD (gastroesophageal reflux disease)   . Insomnia   . Lumbar disc disease   . MIGRAINE, COMMON 09/23/2010   Qualifier: Diagnosis of  By: Jenny Reichmann MD, Hunt Oris   . Pneumonia   . Sickle cell trait (HCC)     Family History  Problem Relation Age of Onset  . Congestive Heart Failure Father   . Hypertension Mother   . Hyperlipidemia Mother     Past Surgical History:  Procedure Laterality Date  . BACK SURGERY    . LUMBAR LAMINECTOMY/DECOMPRESSION MICRODISCECTOMY Bilateral 08/28/2017   Procedure: BILATERAL PARTIAL HEMILAMINECTOMIES L4-5 WITH MICRODISCECTOMY;  Surgeon: Jessy Oto, MD;  Location: [facility];  Service: Orthopedics;  Laterality: Bilateral;  . NO PAST SURGERIES     Social History   Occupational History  . Not on file  Tobacco Use  . Smoking  status: Current Every Day Smoker    Packs/day: 1.00  . Smokeless tobacco: Never Used  Substance and Sexual Activity  . Alcohol use: No    Frequency: Never    Comment: former use - heavy drinker 8 years ago  . Drug use: No  . Sexual activity: Not on file

## 2018-05-31 NOTE — Patient Instructions (Signed)
Avoid bending, stooping and avoid lifting weights greater than 10 lbs. Avoid prolong standing and walking. Order for a new walker with wheels. Surgery scheduling secretary Kandice Hams, will call you in the next week to schedule for surgery.  Surgery recommended is a two level lumbar fusion L5-S1 and L4-5 this would be done with rods, screws and cages with local bone graft and allograft (donor bone graft). Take hydrocodone for for pain. Risk of surgery includes risk of infection 1 in 300 patients, bleeding 2-3% chance you would need a transfusion.   Risk to the nerves is one in 10,000. You will need to use a brace for 3 months and wean from the brace on the 4th month. Expect improved walking and standing tolerance. Expect relief of leg pain but numbness may persist depending on the length and degree of pressure that has been present.

## 2018-11-17 DIAGNOSIS — I2699 Other pulmonary embolism without acute cor pulmonale: Secondary | ICD-10-CM

## 2018-11-17 HISTORY — DX: Other pulmonary embolism without acute cor pulmonale: I26.99

## 2018-11-21 ENCOUNTER — Telehealth (INDEPENDENT_AMBULATORY_CARE_PROVIDER_SITE_OTHER): Payer: Self-pay | Admitting: Specialist

## 2018-11-21 NOTE — Telephone Encounter (Signed)
Pt needs a refill of his medication for Oxycodone.

## 2018-11-21 NOTE — Telephone Encounter (Signed)
Pt needs a refill of his medication for Oxycodone. [phone]

## 2018-11-27 ENCOUNTER — Emergency Department (HOSPITAL_COMMUNITY): Payer: Self-pay

## 2018-11-27 ENCOUNTER — Inpatient Hospital Stay (HOSPITAL_COMMUNITY)
Admission: EM | Admit: 2018-11-27 | Discharge: 2018-11-30 | DRG: 175 | Disposition: A | Discharge status: Home / Self Care | Payer: Self-pay | Attending: Internal Medicine | Admitting: Internal Medicine

## 2018-11-27 ENCOUNTER — Encounter (HOSPITAL_COMMUNITY): Payer: Self-pay | Admitting: *Deleted

## 2018-11-27 ENCOUNTER — Other Ambulatory Visit: Payer: Self-pay

## 2018-11-27 DIAGNOSIS — Z79899 Other long term (current) drug therapy: Secondary | ICD-10-CM

## 2018-11-27 DIAGNOSIS — Z8701 Personal history of pneumonia (recurrent): Secondary | ICD-10-CM

## 2018-11-27 DIAGNOSIS — G8929 Other chronic pain: Secondary | ICD-10-CM | POA: Diagnosis present

## 2018-11-27 DIAGNOSIS — T402X5A Adverse effect of other opioids, initial encounter: Secondary | ICD-10-CM | POA: Diagnosis present

## 2018-11-27 DIAGNOSIS — N280 Ischemia and infarction of kidney: Secondary | ICD-10-CM | POA: Diagnosis present

## 2018-11-27 DIAGNOSIS — I110 Hypertensive heart disease with heart failure: Secondary | ICD-10-CM

## 2018-11-27 DIAGNOSIS — Y92009 Unspecified place in unspecified non-institutional (private) residence as the place of occurrence of the external cause: Secondary | ICD-10-CM

## 2018-11-27 DIAGNOSIS — I509 Heart failure, unspecified: Secondary | ICD-10-CM

## 2018-11-27 DIAGNOSIS — K5900 Constipation, unspecified: Secondary | ICD-10-CM

## 2018-11-27 DIAGNOSIS — I2699 Other pulmonary embolism without acute cor pulmonale: Secondary | ICD-10-CM | POA: Diagnosis present

## 2018-11-27 DIAGNOSIS — M549 Dorsalgia, unspecified: Secondary | ICD-10-CM

## 2018-11-27 DIAGNOSIS — Z87440 Personal history of urinary (tract) infections: Secondary | ICD-10-CM

## 2018-11-27 DIAGNOSIS — Z79891 Long term (current) use of opiate analgesic: Secondary | ICD-10-CM

## 2018-11-27 DIAGNOSIS — Z8042 Family history of malignant neoplasm of prostate: Secondary | ICD-10-CM

## 2018-11-27 DIAGNOSIS — R1032 Left lower quadrant pain: Secondary | ICD-10-CM

## 2018-11-27 DIAGNOSIS — M545 Low back pain: Secondary | ICD-10-CM | POA: Diagnosis present

## 2018-11-27 DIAGNOSIS — F17210 Nicotine dependence, cigarettes, uncomplicated: Secondary | ICD-10-CM | POA: Diagnosis present

## 2018-11-27 DIAGNOSIS — E785 Hyperlipidemia, unspecified: Secondary | ICD-10-CM

## 2018-11-27 DIAGNOSIS — R319 Hematuria, unspecified: Secondary | ICD-10-CM

## 2018-11-27 DIAGNOSIS — I2694 Multiple subsegmental pulmonary emboli without acute cor pulmonale: Principal | ICD-10-CM | POA: Diagnosis present

## 2018-11-27 DIAGNOSIS — J189 Pneumonia, unspecified organism: Secondary | ICD-10-CM | POA: Diagnosis present

## 2018-11-27 DIAGNOSIS — E611 Iron deficiency: Secondary | ICD-10-CM | POA: Diagnosis present

## 2018-11-27 DIAGNOSIS — K5903 Drug induced constipation: Secondary | ICD-10-CM | POA: Diagnosis present

## 2018-11-27 DIAGNOSIS — R1031 Right lower quadrant pain: Secondary | ICD-10-CM

## 2018-11-27 DIAGNOSIS — D573 Sickle-cell trait: Secondary | ICD-10-CM | POA: Diagnosis present

## 2018-11-27 HISTORY — DX: Personal history of urinary calculi: Z87.442

## 2018-11-27 HISTORY — DX: Family history of other specified conditions: Z84.89

## 2018-11-27 LAB — URINALYSIS, ROUTINE W REFLEX MICROSCOPIC
Bilirubin Urine: NEGATIVE
Glucose, UA: NEGATIVE mg/dL
Ketones, ur: 5 mg/dL — AB
Leukocytes, UA: NEGATIVE
Nitrite: NEGATIVE
Protein, ur: NEGATIVE mg/dL
Specific Gravity, Urine: 1.021 (ref 1.005–1.030)
pH: 5 (ref 5.0–8.0)

## 2018-11-27 LAB — COMPREHENSIVE METABOLIC PANEL
ALBUMIN: 4 g/dL (ref 3.5–5.0)
ALT: 25 U/L (ref 0–44)
AST: 27 U/L (ref 15–41)
Alkaline Phosphatase: 49 U/L (ref 38–126)
Anion gap: 12 (ref 5–15)
BUN: 8 mg/dL (ref 6–20)
CO2: 23 mmol/L (ref 22–32)
Calcium: 9.1 mg/dL (ref 8.9–10.3)
Chloride: 101 mmol/L (ref 98–111)
Creatinine, Ser: 1.16 mg/dL (ref 0.61–1.24)
GFR calc Af Amer: >60 mL/min (ref >60)
GFR calc non Af Amer: >60 mL/min (ref >60)
GLUCOSE: 130 mg/dL — AB (ref 70–99)
Potassium: 3.9 mmol/L (ref 3.5–5.1)
SODIUM: 136 mmol/L (ref 135–145)
Total Bilirubin: 1 mg/dL (ref 0.3–1.2)
Total Protein: 7.7 g/dL (ref 6.5–8.1)

## 2018-11-27 LAB — I-STAT TROPONIN, ED: Troponin i, poc: 0 ng/mL (ref 0.00–0.08)

## 2018-11-27 LAB — LACTIC ACID, PLASMA: Lactic Acid, Venous: 1.3 mmol/L (ref 0.5–1.9)

## 2018-11-27 LAB — CBC
HCT: 44.7 % (ref 39.0–52.0)
Hemoglobin: 15 g/dL (ref 13.0–17.0)
MCH: 26.8 pg (ref 26.0–34.0)
MCHC: 33.6 g/dL (ref 30.0–36.0)
MCV: 79.8 fL — ABNORMAL LOW (ref 80.0–100.0)
Platelets: 226 10*3/uL (ref 150–400)
RBC: 5.6 MIL/uL (ref 4.22–5.81)
RDW: 14.6 % (ref 11.5–15.5)
WBC: 11.4 10*3/uL — ABNORMAL HIGH (ref 4.0–10.5)
nRBC: 0 % (ref 0.0–0.2)

## 2018-11-27 LAB — LIPASE, BLOOD: Lipase: 20 U/L (ref 11–51)

## 2018-11-27 LAB — CK: Total CK: 151 U/L (ref 49–397)

## 2018-11-27 LAB — HEPARIN LEVEL (UNFRACTIONATED): Heparin Unfractionated: 0.16 IU/mL — ABNORMAL LOW (ref 0.30–0.70)

## 2018-11-27 MED ORDER — LACTULOSE ENEMA
300.0000 mL | Freq: Once | ORAL | Status: DC | PRN
  Filled 2018-11-27: qty 300

## 2018-11-27 MED ORDER — PNEUMOCOCCAL VAC POLYVALENT 25 MCG/0.5ML IJ INJ
0.5000 mL | INJECTION | INTRAMUSCULAR | Status: DC
  Filled 2018-11-27: qty 0.5

## 2018-11-27 MED ORDER — LACTULOSE 10 GM/15ML PO SOLN
30.0000 g | Freq: Once | ORAL | Status: DC
  Filled 2018-11-27: qty 45

## 2018-11-27 MED ORDER — SENNOSIDES-DOCUSATE SODIUM 8.6-50 MG PO TABS
1.0000 | ORAL_TABLET | Freq: Two times a day (BID) | ORAL | Status: DC
  Administered 2018-11-27 – 2018-11-30 (×4): 1 via ORAL
  Filled 2018-11-27 (×5): qty 1

## 2018-11-27 MED ORDER — SODIUM CHLORIDE 0.9% FLUSH
3.0000 mL | Freq: Once | INTRAVENOUS | Status: AC
  Administered 2018-11-27: 3 mL via INTRAVENOUS

## 2018-11-27 MED ORDER — HEPARIN (PORCINE) 25000 UT/250ML-% IV SOLN
1300.0000 [IU]/h | INTRAVENOUS | Status: DC
  Administered 2018-11-27 (×2): 1050 [IU]/h via INTRAVENOUS
  Administered 2018-11-28: 1300 [IU]/h via INTRAVENOUS
  Filled 2018-11-27 (×2): qty 250

## 2018-11-27 MED ORDER — HEPARIN BOLUS VIA INFUSION
2000.0000 [IU] | Freq: Once | INTRAVENOUS | Status: AC
  Administered 2018-11-27: 2000 [IU] via INTRAVENOUS
  Filled 2018-11-27: qty 2000

## 2018-11-27 MED ORDER — IOPAMIDOL (ISOVUE-370) INJECTION 76%
80.0000 mL | Freq: Once | INTRAVENOUS | Status: AC | PRN
  Administered 2018-11-27: 80 mL via INTRAVENOUS

## 2018-11-27 MED ORDER — MORPHINE SULFATE (PF) 4 MG/ML IV SOLN
4.0000 mg | Freq: Once | INTRAVENOUS | Status: AC
  Administered 2018-11-27: 4 mg via INTRAVENOUS
  Filled 2018-11-27: qty 1

## 2018-11-27 MED ORDER — POLYETHYL GLYCOL-PROPYL GLYCOL 0.4-0.3 % OP SOLN: 1.0000 [drp] | Freq: Three times a day (TID) | OPHTHALMIC | Status: DC | PRN

## 2018-11-27 MED ORDER — PROMETHAZINE HCL 25 MG PO TABS: 12.5000 mg | ORAL_TABLET | Freq: Four times a day (QID) | ORAL | Status: DC | PRN

## 2018-11-27 MED ORDER — POLYVINYL ALCOHOL 1.4 % OP SOLN
1.0000 [drp] | OPHTHALMIC | Status: DC | PRN
  Filled 2018-11-27: qty 15

## 2018-11-27 MED ORDER — FENTANYL CITRATE (PF) 100 MCG/2ML IJ SOLN
50.0000 ug | INTRAMUSCULAR | Status: DC | PRN
  Administered 2018-11-27 – 2018-11-29 (×9): 50 ug via INTRAVENOUS
  Filled 2018-11-27 (×10): qty 2

## 2018-11-27 MED ORDER — HEPARIN BOLUS VIA INFUSION
4000.0000 [IU] | Freq: Once | INTRAVENOUS | Status: AC
  Administered 2018-11-27: 4000 [IU] via INTRAVENOUS
  Filled 2018-11-27: qty 4000

## 2018-11-27 MED ORDER — KETOROLAC TROMETHAMINE 30 MG/ML IJ SOLN
30.0000 mg | Freq: Once | INTRAMUSCULAR | Status: AC
  Administered 2018-11-27: 30 mg via INTRAVENOUS
  Filled 2018-11-27: qty 1

## 2018-11-27 MED ORDER — ACETAMINOPHEN 325 MG PO TABS
650.0000 mg | ORAL_TABLET | Freq: Four times a day (QID) | ORAL | Status: DC | PRN
  Administered 2018-11-27 – 2018-11-30 (×7): 650 mg via ORAL
  Filled 2018-11-27 (×7): qty 2

## 2018-11-27 MED ORDER — PEG 3350-KCL-NA BICARB-NACL 420 G PO SOLR
4000.0000 mL | Freq: Once | ORAL | Status: AC
  Administered 2018-11-27: 4000 mL via ORAL
  Filled 2018-11-27: qty 4000

## 2018-11-27 MED ORDER — OXYCODONE HCL 5 MG PO TABS
20.0000 mg | ORAL_TABLET | ORAL | Status: DC | PRN
  Administered 2018-11-27 – 2018-11-30 (×15): 20 mg via ORAL
  Filled 2018-11-27 (×17): qty 4

## 2018-11-27 MED ORDER — GABAPENTIN 300 MG PO CAPS
300.0000 mg | ORAL_CAPSULE | Freq: Three times a day (TID) | ORAL | Status: DC
  Administered 2018-11-27 – 2018-11-30 (×10): 300 mg via ORAL
  Filled 2018-11-27 (×10): qty 1

## 2018-11-27 MED ORDER — INFLUENZA VAC SPLIT QUAD 0.5 ML IM SUSY
0.5000 mL | PREFILLED_SYRINGE | INTRAMUSCULAR | Status: DC
  Filled 2018-11-27: qty 0.5

## 2018-11-27 MED ORDER — IOPAMIDOL (ISOVUE-370) INJECTION 76%
INTRAVENOUS | Status: AC
  Filled 2018-11-27: qty 100

## 2018-11-27 MED ORDER — SODIUM CHLORIDE 0.9 % IV BOLUS
1000.0000 mL | Freq: Once | INTRAVENOUS | Status: AC
  Administered 2018-11-27: 1000 mL via INTRAVENOUS

## 2018-11-27 MED ORDER — LACTATED RINGERS IV SOLN
INTRAVENOUS | Status: DC
  Administered 2018-11-27: 21:00:00 via INTRAVENOUS

## 2018-11-27 MED ORDER — LIDOCAINE 5 % EX PTCH
1.0000 | MEDICATED_PATCH | CUTANEOUS | Status: DC
  Administered 2018-11-27 – 2018-11-30 (×4): 1 via TRANSDERMAL
  Filled 2018-11-27 (×4): qty 1

## 2018-11-27 NOTE — ED Provider Notes (Signed)
[name] Provider Note   CSN: [id number] Arrival date & time: 11/27/18  6712     History   Chief Complaint Chief Complaint  Patient presents with  . Abdominal Pain    HPI Jason [name] is a 38 y.o. male with history of chronic back pain, GERD, migraines presents for evaluation of acute onset, progressively worsening upper abdominal pain for 3 days.  He reports pain is sharp, worsens with breathing and certain movements. Radiates from the LUQ to the epigastric region. Denies chest pain.  Denies nausea or vomiting.  Denies urinary symptoms but endorses constipation.  Reports last bowel movement was a week ago but does state that this is not unusual for him.  Has been taking his home medicines for chronic back pain but has not been taking anything else for his symptoms and reports no improvement.  Denies recent travel or surgeries, hemoptysis, prior history of DVT or PE, leg swelling, or testosterone replacement.  The history is provided by the patient.    Past Medical History:  Diagnosis Date  . Anxiety   . Back injury   . Cervical disc disease   . Chronic back pain   . Chronic low back pain 03/29/2016  . GERD (gastroesophageal reflux disease)   . Insomnia   . Lumbar disc disease   . MIGRAINE, COMMON 09/23/2010   Qualifier: Diagnosis of  By: Jenny Reichmann MD, Hunt Oris   . Pneumonia   . Sickle cell trait [facility])     Patient Active Problem List   Diagnosis Date Noted  . S/P lumbar laminectomy 08/29/2017  . Spinal stenosis of lumbar region 08/28/2017    Class: Chronic  . Status post lumbar microdiscectomy 08/28/2017  . 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 Gross hematuria 04/01/2016  . Depression 04/01/2016  . Chronic low back pain 03/29/2016  . Cough 08/05/2015  . Encounter for well adult exam with abnormal findings 02/17/2015  . Lumbar disc disease   . Cervical disc  disease   . GERD (gastroesophageal reflux disease)   . Anxiety   . Insomnia   . CONSTIPATION 10/05/2010  . MIGRAINE, COMMON 09/23/2010  . Abdominal pain, other specified site 09/23/2010    Past Surgical History:  Procedure Laterality Date  . BACK SURGERY    . LUMBAR LAMINECTOMY/DECOMPRESSION MICRODISCECTOMY Bilateral 08/28/2017   Procedure: BILATERAL PARTIAL HEMILAMINECTOMIES L4-5 WITH MICRODISCECTOMY;  Surgeon: Jessy Oto, MD;  Location: [facility];  Service: Orthopedics;  Laterality: Bilateral;  . NO PAST SURGERIES          Home Medications    Prior to Admission medications   Medication Sig Start Date End Date Taking? Authorizing Provider  gabapentin (NEURONTIN) 300 MG capsule Take 300 mg 3 (three) times daily by mouth. 06/05/17  Yes [provider]  oxycodone (ROXICODONE) 30 MG immediate release tablet 1 tab by mouth 6 times per day Patient taking differently: Take 20 mg by mouth every 4 (four) hours as needed for pain.  03/15/18  Yes Biagio Borg, MD  Polyethyl Glycol-Propyl Glycol (LUBRICANT EYE DROPS) 0.4-0.3 % SOLN Place 1-2 drops 3 (three) times daily as needed into both eyes (for dry/irritated eyes).   Yes [provider]  methocarbamol (ROBAXIN) 500 MG tablet Take 1 tablet (500 mg total) by mouth 3 (three) times daily between meals as needed. Patient not taking: Reported on 11/27/2018 04/16/18   Jessy Oto, MD  naproxen (NAPROSYN) 375 MG tablet Take 1 tablet (375 mg total) by mouth 2 (two) times daily with a meal. Patient not taking: Reported on 11/27/2018 09/21/17   Waynetta Pean, PA-C  oxyCODONE (OXYCONTIN) 20 mg 12 hr tablet Take 1 tablet (20 mg total) every 12 (twelve) hours by mouth. Patient not taking: Reported on 11/27/2018 08/31/17   Jessy Oto, MD    Family History Family History  Problem Relation Age of Onset  . Congestive Heart Failure Father   . Hypertension Mother   . Hyperlipidemia Mother     Social History Social History    Tobacco Use  . Smoking status: Current Every Day Smoker    Packs/day: 1.00  . Smokeless tobacco: Never Used  Substance Use Topics  . Alcohol use: No    Frequency: Never    Comment: former use - heavy drinker 8 years ago  . Drug use: No     Allergies   Other   Review of Systems Review of Systems  Constitutional: Negative for chills and fever.  Respiratory: Positive for shortness of breath.   Cardiovascular: Negative for chest pain.  Gastrointestinal: Positive for abdominal pain and constipation. Negative for diarrhea, nausea and vomiting.  Genitourinary: Negative for dysuria, frequency, hematuria and urgency.  All other systems reviewed and are negative.    Physical Exam Updated Vital Signs BP 130/76   Pulse (!) 55   Temp 98.8 F (37.1 C) (Oral)   Resp (!) 22   Ht 5\' 8"  (1.727 m)   Wt 68 kg   SpO2 99%   BMI 22.81 kg/m   Physical Exam Vitals signs and nursing note reviewed.  Constitutional:      General: He is not in acute distress.    Appearance: He is well-developed.     Comments: Appears uncomfortable, hyperventilating  HENT:     Head: Normocephalic and atraumatic.  Eyes:     General:        Right eye: No discharge.        Left eye: No discharge.     Conjunctiva/sclera: Conjunctivae normal.  Neck:     Musculoskeletal: Normal range of motion and neck supple.     Vascular: No JVD.     Trachea: No tracheal deviation.  Cardiovascular:     Rate and Rhythm: Normal rate.     Heart sounds: Normal heart sounds.  Pulmonary:     Effort: Pulmonary effort is normal.     Breath sounds: Normal breath sounds.  Chest:     Chest wall: Tenderness present.       Comments: Left inferior lateral chest wall tenderness with no deformity, crepitus, ecchymosis, or flail segment noted. Abdominal:     General: Abdomen is flat. Bowel sounds are normal. There is no distension.     Tenderness: There is abdominal tenderness in the epigastric area and left upper quadrant.  There is guarding (voluntary). There is no right CVA tenderness, left CVA tenderness or rebound. Negative signs include Murphy's sign.  Skin:    General: Skin is warm and dry.     Findings: No erythema.  Neurological:     Mental Status: He is alert.  Psychiatric:        Behavior: Behavior normal.      ED Treatments / Results  Labs (all labs ordered are listed, but only abnormal results are displayed) Labs Reviewed  COMPREHENSIVE METABOLIC PANEL - Abnormal; Notable for the following components:      Result Value  Glucose, Bld 130 (*)    All other components within normal limits  CBC - Abnormal; Notable for the following components:   WBC 11.4 (*)    MCV 79.8 (*)    All other components within normal limits  URINALYSIS, ROUTINE W REFLEX MICROSCOPIC - Abnormal; Notable for the following components:   Color, Urine AMBER (*)    APPearance HAZY (*)    Hgb urine dipstick LARGE (*)    Ketones, ur 5 (*)    Bacteria, UA RARE (*)    All other components within normal limits  LIPASE, BLOOD  I-STAT TROPONIN, ED    EKG None  Radiology Ct Angio Chest Pe W And/or Wo Contrast  Result Date: 11/27/2018 CLINICAL DATA:  Shortness of breath EXAM: CT ANGIOGRAPHY CHEST WITH CONTRAST TECHNIQUE: Multidetector CT imaging of the chest was performed using the standard protocol during bolus administration of intravenous contrast. Multiplanar CT image reconstructions and MIPs were obtained to evaluate the vascular anatomy. CONTRAST:  87mL ISOVUE-370 IOPAMIDOL (ISOVUE-370) INJECTION 76% COMPARISON:  Chest radiograph November 27, 2018. FINDINGS: Cardiovascular: There are multiple pulmonary emboli arising in lower lobe pulmonary arterial branches bilaterally, more severe on the left than on the right. There are also foci of right upper lobe pulmonary embolism. The main pulmonary arteries show no evident embolus. The right ventricle to left ventricular diameter ratio is less than 0.9, not indicative of right  heart strain. There is no thoracic aortic aneurysm. No dissection is evident; the contrast bolus in the aorta is not sufficient to exclude dissection as a differential consideration by radiography. Visualized great vessels appear unremarkable. No pericardial effusion or pericardial thickening evident. Mediastinum/Nodes: Thyroid appears normal. There is no appreciable thoracic adenopathy. No esophageal lesions are evident. Lungs/Pleura: There are foci of bullous disease in the upper lobes. There is focal consolidation in the posterior segment right lower lobe. Note that this area of consolidation appears somewhat wedge-shaped, and there are pulmonary emboli in this area suggesting that this area may represent pulmonary infarct. There is patchy consolidation along the anterior and lateral left base which also may represent areas of infarct. There may be both pneumonia and infarct superimposed. Note that there is slight lower lobe bronchiectatic change bilaterally. Upper Abdomen: Visualized upper abdominal structures appear unremarkable. Musculoskeletal: There are no blastic or lytic bone lesions. No chest wall lesions are evident. Review of the MIP images confirms the above findings. IMPRESSION: 1. Multiple pulmonary emboli without appreciable right heart strain. 2. No thoracic aortic aneurysm. No dissection seen. Note that the contrast bolus is not sufficient in the aorta to assess meaningfully for potential aortic dissection. 3. Somewhat wedge-shaped airspace opacity in the posterolateral right base. Suspect pulmonary infarct with questionable superimposed pneumonia. Somewhat less well-defined lateral left base opacity. Question infarct with potential superimposed pneumonia as well in this area. 4.  No appreciable thoracic adenopathy. Critical Value/emergent results were called by telephone at the time of interpretation on 11/27/2018 at 11:13 am to [employer], PA, who verbally acknowledged these results.  Electronically Signed   By: Lowella Grip III M.D.   On: 11/27/2018 11:13   Dg Abdomen Acute W/chest  Result Date: 11/27/2018 CLINICAL DATA:  Patient reports stomach pains for 3 days. Denies any NVD. Reports constipation for 1 week. Denies any sob. Hx of sickle cell trait. No prior heart or lung, or abdominal surgeries. 20 year current smoker. EXAM: DG ABDOMEN ACUTE W/ 1V CHEST COMPARISON:  11/29/2017 and previous FINDINGS: Lungs are clear. Heart size normal.  No free air. No effusion. Stomach and small bowel are nondilated. Moderate colonic fecal material, rectum decompressed. Bilateral pelvic phleboliths. Regional bones unremarkable. IMPRESSION: 1. No acute cardiopulmonary disease. 2. Nonobstructive bowel gas pattern with moderate colonic fecal material. Electronically Signed   By: Lucrezia Europe M.D.   On: 11/27/2018 08:58   Ct Renal Stone Study  Result Date: 11/27/2018 CLINICAL DATA:  38 year old male with history of flank pain. Stone disease. EXAM: CT ABDOMEN AND PELVIS WITHOUT CONTRAST TECHNIQUE: Multidetector CT imaging of the abdomen and pelvis was performed following the standard protocol without IV contrast. COMPARISON:  CT the abdomen and pelvis 12/20/2015. FINDINGS: Lower chest: Airspace consolidation in the periphery of the lower lobes of the lungs bilaterally. Hepatobiliary: No definite suspicious cystic or solid hepatic lesions are confidently identified on today's noncontrast CT examination. Unenhanced appearance of the gallbladder is normal. Pancreas: No pancreatic mass or peripancreatic fluid or inflammatory changes are noted on today's noncontrast CT examination. Spleen: Unremarkable. Adrenals/Urinary Tract: There are no abnormal calcifications within the collecting system of either kidney, along the course of either ureter, or within the lumen of the urinary bladder. No hydroureteronephrosis or perinephric stranding to suggest urinary tract obstruction at this time. The unenhanced  appearance of the kidneys is unremarkable bilaterally. Unenhanced appearance of the urinary bladder is normal. Bilateral adrenal glands are normal in appearance. Stomach/Bowel: Unenhanced appearance of the stomach is normal. No pathologic dilatation of small bowel or colon. The appendix is not confidently identified and may be surgically absent. Regardless, there are no inflammatory changes noted adjacent to the cecum to suggest the presence of an acute appendicitis at this time. Vascular/Lymphatic: No atherosclerotic calcifications noted in the abdominal aorta or pelvic vasculature. No lymphadenopathy noted in the abdomen or pelvis. Reproductive: Prostate gland and seminal vesicles are unremarkable in appearance. Other: No significant volume of ascites.  No pneumoperitoneum. Musculoskeletal: There are no aggressive appearing lytic or blastic lesions noted in the visualized portions of the skeleton. IMPRESSION: 1. No acute findings are noted in the abdomen or pelvis to account for the patient's symptoms. 2. However, there are areas of peripheral airspace consolidation in the visualized lung bases concerning for multilobar pneumonia. Electronically Signed   By: Vinnie Langton M.D.   On: 11/27/2018 09:45    Procedures .Critical Care Performed by: Renita Papa, PA-C Authorized by: Renita Papa, PA-C   Critical care provider statement:    Critical care time (minutes):  45   Critical care was necessary to treat or prevent imminent or life-threatening deterioration of the following conditions:  Respiratory failure   Critical care was time spent personally by me on the following activities:  Discussions with consultants, evaluation of patient's response to treatment, examination of patient, ordering and performing treatments and interventions, ordering and review of laboratory studies, ordering and review of radiographic studies, pulse oximetry, re-evaluation of patient's condition, obtaining history from  patient or surrogate and review of old charts   I assumed direction of critical care for this patient from another provider in my specialty: no     (including critical care time)  Medications Ordered in ED Medications  lidocaine (LIDODERM) 5 % 1 patch (1 patch Transdermal Patch Applied 11/27/18 0822)  morphine 4 MG/ML injection 4 mg (has no administration in time range)  sodium chloride flush (NS) 0.9 % injection 3 mL (3 mLs Intravenous Given 11/27/18 0821)  ketorolac (TORADOL) 30 MG/ML injection 30 mg (30 mg Intravenous Given 11/27/18 0821)  sodium chloride 0.9 %  bolus 1,000 mL (1,000 mLs Intravenous New Bag/Given 11/27/18 1108)  iopamidol (ISOVUE-370) 76 % injection 80 mL (80 mLs Intravenous Contrast Given 11/27/18 1053)     Initial Impression / Assessment and Plan / ED Course  I have reviewed the triage vital signs and the nursing notes.  Pertinent labs & imaging results that were available during my care of the patient were reviewed by me and considered in my medical decision making (see chart for details).     Patient presenting for evaluation of pleuritic left-sided chest pain and upper abdominal pain for 3 days.  He is initially borderline febrile and mildly tachycardic on presentation with stable SPO2 saturations.  He appears very uncomfortable.  Will obtain acute abdomen and chest radiographs to rule out free air, pneumonia, or radiolucent stones and reassess. 8:42 AM Spoke with Theadora Rama, RN at the patient's pain management clinic in [place] where he sees Dr. Mirna Mires.  She reports that he was recently switched to Suboxone sublingual films due to medication noncompliance/drugs found on UDS on multiple occasions. She reports he missed his last appointment with Dr. Mirna Mires but has an appointment scheduled in 2 days.   Lab work reviewed by me shows mild nonspecific leukocytosis, no anemia, no metabolic derangements.  UA does show some blood but is equivocal for UTI.  He has no urinary  symptoms.  He had some pelvic phleboliths on x-ray which can sometimes be mistaken for kidney stones so CT renal stone study was ordered which showed no acute findings in the abdomen or pelvis but there were areas of peripheral airspace consolidation in the lung bases concerning for multi lobar pneumonia.  Given tachycardia, pleuritic chest pain, and abnormal findings on renal stone study, a CTA of the chest was obtained which showed multiple pulmonary emboli with no evidence of right heart strain but evidence to suggest pulmonary infarct versus pneumonia.  He has not been complaining of any cough and has not been truly febrile here so will avoid initiating antibiotics at this time.  Will give heparin bolus and start gtt. No evidence of right heart strain on EKG reviewed by me.  Troponin is negative.Internal medicine teaching service to admit.  Final Clinical Impressions(s) / ED Diagnoses   Final diagnoses:  Multiple subsegmental pulmonary emboli without acute cor pulmonale    ED Discharge Orders    None       Debroah Baller 11/27/18 1719    Blanchie Dessert, MD 11/27/18 2047

## 2018-11-27 NOTE — Progress Notes (Signed)
ANTICOAGULATION CONSULT NOTE - Initial Consult  Pharmacy Consult for heparin Indication: pulmonary embolus  Patient Measurements: Height: 5\' 8"  (172.7 cm) Weight: 150 lb (68 kg) IBW/kg (Calculated) : 68.4 Heparin Dosing Weight: 68 kg  Vital Signs: Temp: 98.8 F (37.1 C) (02/11 0811) Temp Source: Oral (02/11 0811) BP: 130/76 (02/11 1141) Pulse Rate: 55 (02/11 1141)  Labs: Recent Labs    11/27/18 0550  HGB 15.0  HCT 44.7  PLT 226  CREATININE 1.16     Medical History: Past Medical History:  Diagnosis Date  . Anxiety   . Back injury   . Cervical disc disease   . Chronic back pain   . Chronic low back pain 03/29/2016  . GERD (gastroesophageal reflux disease)   . Insomnia   . Lumbar disc disease   . MIGRAINE, COMMON 09/23/2010   Qualifier: Diagnosis of  By: Jenny Reichmann MD, Hunt Oris   . Pneumonia   . Sickle cell trait [facility])     Assessment: 38 yo male with abdominal pain. He reports small amounts of hemoptysis prior to admission. CTA is positive for multiple PE - without R heart strain. SCr 1.1, h/h plts wnl.   Goal of Therapy:  Heparin level 0.3-0.7 units/ml Monitor platelets by anticoagulation protocol: Yes    Plan:  -Heparin bolus 4000 units x1 then 1050 units/hr -Daily HL, CBC -Check level in 6 hours   Harvel Quale 11/27/2018,12:06 PM

## 2018-11-27 NOTE — ED Notes (Signed)
Jason [name] Tray Ordered @ 1322-per RN-called by Levada Dy

## 2018-11-27 NOTE — H&P (Signed)
Date: 11/27/2018               Patient Name:  Jason [name] MRN: [id number]  DOB: Jan 16, 1981 Age / Sex: 38 y.o., male   PCP: Patient, No Pcp Per         Medical Service: Internal Medicine Teaching Service         Attending Physician: Dr. Sid Falcon, MD    First Contact: Dr. Annie Paras Pager: [phone]  Second Contact: Dr. Shan Levans Pager: [phone]       After Hours (After 5p/  First Contact Pager: [phone]  weekends / holidays): Second Contact Pager: [phone]   Chief Complaint: abdominal pain   History of Present Illness: Jason [name] is a 38 yo man with a medical history of sickle cell trait and chronic back pain s/p lumbar laminectomy in 2018 who presented to the ED with lower abdominal pain that started three days ago. He describes the pain as sharp and pleuritic. The pain does not change with eating. He denies nausea or vomiting and has been able to "nibble" on foods in the past few days. He has not had a bowel movement since the pain started. He normally has a bowel movement once weekly, and he noticed that his last BM was very dark. He reports that his home oxycodone (prescribed for his back pain) did not help with the abdominal pain. He reports very infrequent alcohol use. He has no history of abdominal pain like this, but he does have a history of constipation that has required enemas in the past. He states that he has been hospitalized previously for an abdominal infection that required antibiotics. This was about 2 years ago elsewhere in [place] and he does not know what kind of infection it was. He has never had a colonoscopy.   He denies chest pain or dyspnea. He endorses scant hemoptysis last night with blood-streaked green sputum. He otherwise does not have a cough. He denies lower extremity swelling or pain. He has no history of blood clots. Denies history of malignancy, recent surgery, prolonged immobilization, or family history of blood clots. He denies injection  drug use.     He endorses his baseline back pain in the lumbar spine. He also has baseline neck pain that occurs when he turns his neck. He denies any new back pain.   Upon arrival to the ED, patient was afebrile and hemodynamically stable. He was tachypneic with RRs in the 20s. Labs were significant for mild leukocytosis to 11.4 and negative lipase. UA showed large hemoglobin with 11-20 RBCs and 5 ketones. EKG showed early repolarization and no signs of ischemia or right heart strain. CXR without opacities or edema. Abdominal xray with moderate colonic fecal material and no obstruction. CTA showed multiple pulmonary emboli without appreciable right heart strain. CT renal showed no acute findings in the abdomen or pelvis. The patient received 1L NS, toradol, and morphine. He was started on heparin. He continues to have pain, which he reports responded better to toradol than morphine.   Meds:  Current Meds  Medication Sig  . gabapentin (NEURONTIN) 300 MG capsule Take 300 mg 3 (three) times daily by mouth.  Marland Kitchen oxycodone (ROXICODONE) 30 MG immediate release tablet 1 tab by mouth 6 times per day (Patient taking differently: Take 20 mg by mouth every 4 (four) hours as needed for pain. )  . Polyethyl Glycol-Propyl Glycol (LUBRICANT EYE DROPS) 0.4-0.3 % SOLN Place 1-2 drops 3 (three) times daily as  needed into both eyes (for dry/irritated eyes).    Allergies: Allergies as of 11/27/2018 - Review Complete 11/27/2018  Allergen Reaction Noted  . Other Hives and Swelling 03/09/2016   Past Medical History:  Diagnosis Date  . Anxiety   . Back injury   . Cervical disc disease   . Chronic back pain   . Chronic low back pain 03/29/2016  . GERD (gastroesophageal reflux disease)   . Insomnia   . Lumbar disc disease   . MIGRAINE, COMMON 09/23/2010   Qualifier: Diagnosis of  By: Jenny Reichmann MD, Hunt Oris   . Pneumonia   . Sickle cell trait [facility])    Past Surgical History:  Procedure Laterality Date  . BACK SURGERY     . LUMBAR LAMINECTOMY/DECOMPRESSION MICRODISCECTOMY Bilateral 08/28/2017   Procedure: BILATERAL PARTIAL HEMILAMINECTOMIES L4-5 WITH MICRODISCECTOMY;  Surgeon: Jessy Oto, MD;  Location: [place];  Service: Orthopedics;  Laterality: Bilateral;  . NO PAST SURGERIES      Family History:  Family History  Problem Relation Age of Onset  . Congestive Heart Failure Father   . Hypertension Mother   . Hyperlipidemia Mother   Uncle: prostate cancer No known family history of blood clots.   Social History: Lives with his girlfriend. Currently unemployed because of his chronic back pain. Currently smokes one pack of cigarettes per day, has done this for 20 years. Reports infrequent etoh use. Denies illicit drug use. Denies ever using injection drugs.   Review of Systems: A complete ROS was negative except as per HPI.   Physical Exam: Blood pressure 120/81, pulse 65, temperature 98.8 F (37.1 C), temperature source Oral, resp. rate (!) 23, height 5\' 8"  (1.727 m), weight 68 kg, SpO2 98 %.  Constitutional: Lying in a fetal position, appears distressed  Eyes: Pupils are equal, round, and reactive to light. EOM are normal.  Cardiovascular: Normal rate and regular rhythm. No murmurs, rubs, or gallops. Pulmonary/Chest: Effort normal. Clear to auscultation bilaterally. No wheezes, rales, or rhonchi. Abdominal: Bowel sounds present. Soft, non-distended. TTP in the RLQ and LLQ. No rebound tenderness. Ext: No lower extremity edema. Skin: Warm and dry. No rashes or wounds.   EKG: personally reviewed my interpretation is NSR with early repolarization.   CXR: personally reviewed my interpretation is no edema or opacities.  Assessment & Plan by Problem: Active Problems:   Pulmonary embolism [facility])  Jason [name] is a 38 yo man with a medical history of sickle cell trait and chronic back pain s/p lumbar laminectomy in 2018 who presented with three days of pleuritic RLQ and LLQ abdominal pain, subjective  fevers, and chills. He was afebrile and hemodynamically stable on arrival. CTA chest showed multiple pulmonary emboli. He was started on heparin and admitted for further evaluation and management.  Multiple Pulmonary Emboli - Hemodynamically stable. No signs of right heart strain on CTA chest or EKG.  - He has no prior history of blood clots and no known family history of blood clots.  - Endorses one episode of hemoptysis. Denies dyspnea and leg swelling.  - Unknown etiology. No provoking factors. Hypercoagulable workup would not be comprehensive/accurate while on heparin.  - Patient has a mild leukocytosis, but he is afebrile. Denies illicit drug use. Based on CT chest, the emboli do not look to be septic in nature.  Plan - Telemetry  - Continue heparin  - LR 64ml/hr  Abdominal pain  - This is the patient's main concern today. He has ttp in the LLQ and  RLQ. No rebound or peritoneal signs. - CT abdomen was negative for acute abdominal process, including nephrolithiasis, appendicitis, diverticulitis, and ischemic bowel. 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 Abdominal xray showed moderate fecal burden in the colon. His pain may be due solely to constipation.   - He has no n/v or diarrhea to suggest gastroenteritis - Lipase is wnl. No concern for pancreatitis Plan - Pain control with home oxycodone 20mg  q4hrs PRN plus fentanyl 57mcg IV q4hrs PRN for breakthrough pain  - Bowel prep for constipation - Lactulose enema prn  - Senokot BID  - Phenergan prn for nausea - Regular diet  - Lactic acid - Continue to monitor - Consider repeat imaging if his pain does not improve with BM  Hematuria - Patient has a history of both gross and microscopic hematuria dating back to 2013 - He has been treated for UTI in the past with no improvement in his UA. He is unsure if he's ever had a kidney stone. No concern for infection at this time. No stone seen on CT abdomen/pelvis.  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  Signed: Corinne Ports, MD 11/27/2018, 1:41 PM  Pager: [phone]

## 2018-11-27 NOTE — ED Triage Notes (Signed)
C/o abd. Pain onset 3 days denies n/v or diarrhea. C/o dark stools

## 2018-11-27 NOTE — Progress Notes (Signed)
ANTICOAGULATION CONSULT NOTE - Follow-Up Consult  Pharmacy Consult for heparin Indication: pulmonary embolus  Patient Measurements: Height: 5\' 8"  (172.7 cm) Weight: 150 lb (68 kg) IBW/kg (Calculated) : 68.4 Heparin Dosing Weight: 68 kg  Vital Signs: Temp: 98.2 F (36.8 C) (02/11 1727) Temp Source: Oral (02/11 1727) BP: 107/73 (02/11 1727) Pulse Rate: 84 (02/11 1727)  Labs: Recent Labs    11/27/18 0550 11/27/18 1307  HGB 15.0  --   HCT 44.7  --   PLT 226  --   CREATININE 1.16  --   CKTOTAL  --  151     Medical History: Past Medical History:  Diagnosis Date  . Anxiety   . Back injury   . Cervical disc disease   . Chronic back pain   . Chronic low back pain 03/29/2016  . Family history of adverse reaction to anesthesia   . GERD (gastroesophageal reflux disease)   . History of kidney stones    per mother- "there was a diagnoses at some point"  . Insomnia   . Lumbar disc disease   . MIGRAINE, COMMON 09/23/2010   Qualifier: Diagnosis of  By: Jenny Reichmann MD, Hunt Oris   . Pneumonia   . Sickle cell trait [facility])     Assessment: 38 yo male with abdominal pain. He reports small amounts of hemoptysis prior to admission. CTA is positive for multiple PE - without R heart strain. SCr 1.1, h/h plts wnl. Pharmacy consulted for Heparin dosing.  Heparin level this evening is SUBtherapeutic (HL 0.16, goal of 0.3-0.7). It is noted that the patient has a history of hematuria - none currently reported per RN and no issues with the drip. Will bolus and increase.   Goal of Therapy:  Heparin level 0.3-0.7 units/ml Monitor platelets by anticoagulation protocol: Yes   Plan:  - Heparin bolus of 2000 units x 1 - Increase Heparin drip rate to 1300 units/hr (13 ml/hr) - Will continue to monitor for any signs/symptoms of bleeding and will follow up with heparin level in 6 hours   Thank you for allowing pharmacy to be a part of this patient's care.  Alycia Rossetti, PharmD, BCPS Clinical  Pharmacist Clinical phone for 11/27/2018: [phone] 11/27/2018 8:37 PM   **Pharmacist phone directory can now be found on [email or web address] ([email or web address]).  Listed under [place].

## 2018-11-28 ENCOUNTER — Inpatient Hospital Stay (HOSPITAL_COMMUNITY): Payer: Self-pay

## 2018-11-28 ENCOUNTER — Ambulatory Visit (INDEPENDENT_AMBULATORY_CARE_PROVIDER_SITE_OTHER): Payer: Self-pay | Admitting: Surgery

## 2018-11-28 ENCOUNTER — Telehealth (INDEPENDENT_AMBULATORY_CARE_PROVIDER_SITE_OTHER): Payer: Self-pay | Admitting: Specialist

## 2018-11-28 DIAGNOSIS — I2699 Other pulmonary embolism without acute cor pulmonale: Secondary | ICD-10-CM

## 2018-11-28 DIAGNOSIS — R1013 Epigastric pain: Secondary | ICD-10-CM

## 2018-11-28 DIAGNOSIS — E611 Iron deficiency: Secondary | ICD-10-CM

## 2018-11-28 LAB — CBC WITH DIFFERENTIAL/PLATELET
Abs Immature Granulocytes: 0.04 10*3/uL (ref 0.00–0.07)
BASOS ABS: 0 10*3/uL (ref 0.0–0.1)
Basophils Relative: 0 %
Eosinophils Absolute: 0 10*3/uL (ref 0.0–0.5)
Eosinophils Relative: 0 %
HCT: 40.3 % (ref 39.0–52.0)
Hemoglobin: 13.3 g/dL (ref 13.0–17.0)
Immature Granulocytes: 0 %
LYMPHS ABS: 2 10*3/uL (ref 0.7–4.0)
Lymphocytes Relative: 17 %
MCH: 26.3 pg (ref 26.0–34.0)
MCHC: 33 g/dL (ref 30.0–36.0)
MCV: 79.8 fL — ABNORMAL LOW (ref 80.0–100.0)
Monocytes Absolute: 2 10*3/uL — ABNORMAL HIGH (ref 0.1–1.0)
Monocytes Relative: 17 %
NRBC: 0 % (ref 0.0–0.2)
Neutro Abs: 7.4 10*3/uL (ref 1.7–7.7)
Neutrophils Relative %: 66 %
Platelets: 201 10*3/uL (ref 150–400)
RBC: 5.05 MIL/uL (ref 4.22–5.81)
RDW: 14.5 % (ref 11.5–15.5)
WBC: 11.5 10*3/uL — ABNORMAL HIGH (ref 4.0–10.5)

## 2018-11-28 LAB — RAPID URINE DRUG SCREEN, HOSP PERFORMED
Amphetamines: NOT DETECTED
Barbiturates: NOT DETECTED
Benzodiazepines: NOT DETECTED
Cocaine: POSITIVE — AB
Opiates: NOT DETECTED
Tetrahydrocannabinol: NOT DETECTED

## 2018-11-28 LAB — COMPREHENSIVE METABOLIC PANEL
ALBUMIN: 3.2 g/dL — AB (ref 3.5–5.0)
ALT: 16 U/L (ref 0–44)
AST: 16 U/L (ref 15–41)
Alkaline Phosphatase: 39 U/L (ref 38–126)
Anion gap: 6 (ref 5–15)
BUN: 12 mg/dL (ref 6–20)
CO2: 29 mmol/L (ref 22–32)
Calcium: 8.4 mg/dL — ABNORMAL LOW (ref 8.9–10.3)
Chloride: 103 mmol/L (ref 98–111)
Creatinine, Ser: 1.22 mg/dL (ref 0.61–1.24)
GFR calc Af Amer: >60 mL/min (ref >60)
GFR calc non Af Amer: >60 mL/min (ref >60)
Glucose, Bld: 122 mg/dL — ABNORMAL HIGH (ref 70–99)
Potassium: 4.2 mmol/L (ref 3.5–5.1)
Sodium: 138 mmol/L (ref 135–145)
Total Bilirubin: 1 mg/dL (ref 0.3–1.2)
Total Protein: 6.6 g/dL (ref 6.5–8.1)

## 2018-11-28 LAB — ECHOCARDIOGRAM COMPLETE
Height: 68 in
Weight: 2400 oz

## 2018-11-28 LAB — CBC
HEMATOCRIT: 39.9 % (ref 39.0–52.0)
Hemoglobin: 13.1 g/dL (ref 13.0–17.0)
MCH: 25.8 pg — ABNORMAL LOW (ref 26.0–34.0)
MCHC: 32.8 g/dL (ref 30.0–36.0)
MCV: 78.5 fL — ABNORMAL LOW (ref 80.0–100.0)
Platelets: 198 10*3/uL (ref 150–400)
RBC: 5.08 MIL/uL (ref 4.22–5.81)
RDW: 14.2 % (ref 11.5–15.5)
WBC: 11.5 10*3/uL — ABNORMAL HIGH (ref 4.0–10.5)
nRBC: 0 % (ref 0.0–0.2)

## 2018-11-28 LAB — IRON AND TIBC
Iron: 20 ug/dL — ABNORMAL LOW (ref 45–182)
Saturation Ratios: 8 % — ABNORMAL LOW (ref 17.9–39.5)
TIBC: 265 ug/dL (ref 250–450)
UIBC: 245 ug/dL

## 2018-11-28 LAB — FERRITIN: Ferritin: 165 ng/mL (ref 24–336)

## 2018-11-28 LAB — HIV ANTIBODY (ROUTINE TESTING W REFLEX): HIV Screen 4th Generation wRfx: NONREACTIVE

## 2018-11-28 LAB — PROCALCITONIN: Procalcitonin: 0.42 ng/mL

## 2018-11-28 LAB — HEPARIN LEVEL (UNFRACTIONATED)
Heparin Unfractionated: 0.37 IU/mL (ref 0.30–0.70)
Heparin Unfractionated: 0.18 IU/mL — ABNORMAL LOW (ref 0.30–0.70)

## 2018-11-28 MED ORDER — KETOROLAC TROMETHAMINE 30 MG/ML IJ SOLN
30.0000 mg | Freq: Once | INTRAMUSCULAR | Status: AC | PRN
  Administered 2018-11-28: 30 mg via INTRAVENOUS
  Filled 2018-11-28: qty 1

## 2018-11-28 MED ORDER — LACTATED RINGERS IV SOLN
INTRAVENOUS | Status: AC
  Administered 2018-11-28: 09:00:00 via INTRAVENOUS

## 2018-11-28 MED ORDER — RIVAROXABAN 15 MG PO TABS
15.0000 mg | ORAL_TABLET | Freq: Two times a day (BID) | ORAL | Status: DC
  Administered 2018-11-28 – 2018-11-30 (×5): 15 mg via ORAL
  Filled 2018-11-28 (×5): qty 1

## 2018-11-28 MED ORDER — KETOROLAC TROMETHAMINE 30 MG/ML IJ SOLN
30.0000 mg | Freq: Three times a day (TID) | INTRAMUSCULAR | Status: AC | PRN
  Administered 2018-11-28 – 2018-11-29 (×2): 30 mg via INTRAVENOUS
  Filled 2018-11-28 (×2): qty 1

## 2018-11-28 NOTE — Progress Notes (Signed)
Subjective: Overnight, Mr. Benyo continued to have pain despite opioids. He received a dose of toradol. He also spiked a fever to 102. He was given tylenol and blood cultures were drawn. This morning he is afebrile. He reports that he continues to have severe abdominal pain that has moved from the RLQ and LLQ to umbilical/epigastric region. He has had about two cups full of the golytely solution. He has not yet had a bowel movement. He has new pleuritic right-sided low back pain.   Objective:  Vital signs in last 24 hours: Vitals:   11/27/18 2234 11/27/18 2310 11/28/18 0121 11/28/18 0501  BP:  104/68  111/79  Pulse:  74  65  Resp:  18    Temp: (!) 102 F (38.9 C) (!) 100.4 F (38 C) 99.1 F (37.3 C) 99.3 F (37.4 C)  TempSrc: Oral Oral Oral Oral  SpO2:  98%  99%  Weight:      Height:       General: Resting in bed, intermittent distress Cardiac: RRR, no murmurs Pulmonary: CTAB, normal effort on room air Abdominal: Voluntary guarding. Bowel sounds present. Soft, non-distended. Tender in the umbilical/epigastric region. Ext: no edema  Assessment/Plan:  Active Problems:   Pulmonary embolism [facility])  Mr. Erhardt is a 38 yo man with a medical history of sickle cell trait and chronic back pain s/p lumbar laminectomy in 2018 who presented with three days of pleuritic RLQ and LLQ abdominal pain, subjective fevers, and chills. He was afebrile and hemodynamically stable on arrival. CTA chest showed multiple pulmonary emboli with a suspected pulmonary infarct in the right posterolateral base. He was started on heparin and admitted for further evaluation and management.  Multiple Pulmonary Emboli - Hemodynamically stable. - Unknown etiology. No provoking factors. Hypercoagulable workup would not be comprehensive/accurate while on heparin. Patient has sickle cell trait, which may confer an increased risk of PE. However, there are no management changes recommended for PE in a sickle cell  trait patient vs in a non-sickle cell trait patient.  - Patient has been febrile to 102.6. His white count is stable today at 11.5. The fever may be caused by the pulmonary emboli and pulmonary infarction. However, cannot rule out septic emboli. TTE was negative for vegetations. Blood cultures with NG x 12 hrs. Will hold off on antibiotics for now.  Plan - Telemetry  - Continue to monitor fever curve  - Transition from heparin to Xarelto 15mg  BID. BID dosing for the first 21 days and then daily dosing. - F/u blood cultures  - LR 149ml/hr for 10 hrs  Abdominal pain  - [name] pain has migrated upwards to the umbilical/epigastric region - Patient has not yet had a bowel movement.  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 His iron level is low at 20 with a saturation of 8%.  - Will hold off on any iron supplement until infection has been ruled out  Chronic back pain - Continue home oxycodone 20mg  q4hrs prn and gabapentin 300mg  TID  Dispo: Anticipated discharge in approximately 2-3 days.   , Andree Elk, MD 11/28/2018, 6:28 AM Pager: [phone]

## 2018-11-28 NOTE — Progress Notes (Signed)
Pharmacist rounding with IMTP/B1/[facility]. Discussed options for transitioning from parenteral (IV) heparin to oral agents (DOACs). Options for single-oral approach are:  rivaroxaban 15mg  PO BID x 21 days, transitioning to 20mg  PO QD on Day 22. If the patient is NPO, apixaban 10mg  PO BID x 7 days, transitioning to 5mg  PO BID for duration of therapy (six months based upon package insert FDA approval), since the requirement for "food" to enhance absorption is not a requirement for apixaban. Whichever DOAC you choose, I would recommend continuing unfractionated heparin for one hour after first dose of DOAC is administered, since it takes approximately 1-2 hours for absorption of the DOAC.

## 2018-11-28 NOTE — Care Management Note (Signed)
Case Management Note  Patient Details  Name: Jason [name] MRN: [id number] Date of Birth: 1981-07-25  Subjective/Objective:  38 yo male presented with abdominal pain, found to have a PE.                 Action/Plan: CM met with patient to discuss dispositional needs. Patient lived at home and was independent with ADLs PTA. Patient verified as having no established PCP or active health insurance, but agreeable to [facility] f/u appointment arranged at: [facility] on 12/05/18 @ 1400, patient can utilize [facility] pharmacy service for discounted Rx for $4-$10; AVS updated. Patient will transition home on Xarelto, with a Xarelto 30-day free card provided; patient will use [facility] pharmacy for his Rx needs prior to transitioning home. CM team will continue to follow.   Expected Discharge Date:                  Expected Discharge Plan:  Home/Self Care  In-House Referral:  NA  Discharge planning Services  CM Consult, Follow-up appt scheduled, [facility], Medication Assistance(Xarelto card )  Post Acute Care Choice:  NA Choice offered to:  NA  DME Arranged:  N/A DME Agency:  NA  HH Arranged:  NA HH Agency:  NA  Status of Service:  In process, will continue to follow  If discussed at Long Length of Stay Meetings, dates discussed:    Additional Comments:  Midge Minium RN, BSN, NCM-BC, ACM-RN [phone] 11/28/2018, 2:25 PM

## 2018-11-28 NOTE — Progress Notes (Signed)
  Echocardiogram 2D Echocardiogram has been performed.  Jason [name] 11/28/2018, 10:47 AM

## 2018-11-28 NOTE — Progress Notes (Signed)
Patient off floor for Echo.

## 2018-11-28 NOTE — Progress Notes (Signed)
MD notified about patients temperature.

## 2018-11-28 NOTE — Telephone Encounter (Signed)
Patient mother Jason [name] called to state patient was admitted to hospital last night. He has blood clots in his lungs and pain in the hip and lower back and having trouble walking.   Patient mothers Jason [name] called and asked if [name] a some point could stop by to see him on [address]

## 2018-11-28 NOTE — Progress Notes (Signed)
ANTICOAGULATION CONSULT NOTE - Follow-Up Consult  Pharmacy Consult for heparin Indication: pulmonary embolus  Patient Measurements: Height: 5\' 8"  (172.7 cm) Weight: 150 lb (68 kg) IBW/kg (Calculated) : 68.4 Heparin Dosing Weight: 68 kg  Vital Signs: Temp: 99.3 F (37.4 C) (02/12 0501) Temp Source: Oral (02/12 0501) BP: 111/79 (02/12 0501) Pulse Rate: 65 (02/12 0501)  Labs: Recent Labs    11/27/18 0550 11/27/18 1307 11/27/18 1918 11/28/18 0349  HGB 15.0  --   --  13.1  HCT 44.7  --   --  39.9  PLT 226  --   --  198  HEPARINUNFRC  --   --  0.16* 0.37  CREATININE 1.16  --   --  1.22  CKTOTAL  --  151  --   --      Medical History: Past Medical History:  Diagnosis Date  . Anxiety   . Back injury   . Cervical disc disease   . Chronic back pain   . Chronic low back pain 03/29/2016  . Family history of adverse reaction to anesthesia   . GERD (gastroesophageal reflux disease)   . History of kidney stones    per mother- "there was a diagnoses at some point"  . Insomnia   . Lumbar disc disease   . MIGRAINE, COMMON 09/23/2010   Qualifier: Diagnosis of  By: Jenny Reichmann MD, Hunt Oris   . Pneumonia   . Sickle cell trait [facility])     Assessment: 38 yo male with abdominal pain. He reports small amounts of hemoptysis prior to admission. CTA is positive for multiple PE - without R heart strain. SCr 1.1, h/h plts wnl. Pharmacy consulted for Heparin dosing.  Heparin level this morning is therapeutic (HL 0.37, goal of 0.3-0.7). It is noted that the patient has a history of hematuria - none currently reported per RN and no issues with the drip.   Goal of Therapy:  Heparin level 0.3-0.7 units/ml Monitor platelets by anticoagulation protocol: Yes   Plan:  - Continue Heparin drip rate at 1300 units/hr (13 ml/hr) - Will continue to monitor for any signs/symptoms of bleeding and will follow up with heparin level in 6 hours   Sharad Vaneaton A. Levada Dy, PharmD, [place] Pager: [phone] Please utilize Amion for appropriate phone number to reach the unit pharmacist ([place])

## 2018-11-28 NOTE — Progress Notes (Signed)
Report called to Verdis Frederickson on [address]. Family aware of patient being transferred.

## 2018-11-28 NOTE — Progress Notes (Signed)
Pt arrived on the unit. A/o, VS stable, RA. C/o pain on the left chest area. Lidocaine patch is on Lt chest side. Family at the bedside, updated.Belongings at the bedside

## 2018-11-29 ENCOUNTER — Telehealth (INDEPENDENT_AMBULATORY_CARE_PROVIDER_SITE_OTHER): Payer: Self-pay | Admitting: Specialist

## 2018-11-29 LAB — BASIC METABOLIC PANEL
Anion gap: 8 (ref 5–15)
BUN: 6 mg/dL (ref 6–20)
CO2: 26 mmol/L (ref 22–32)
Calcium: 8.1 mg/dL — ABNORMAL LOW (ref 8.9–10.3)
Chloride: 104 mmol/L (ref 98–111)
Creatinine, Ser: 1 mg/dL (ref 0.61–1.24)
GFR calc Af Amer: >60 mL/min (ref >60)
GFR calc non Af Amer: >60 mL/min (ref >60)
Glucose, Bld: 108 mg/dL — ABNORMAL HIGH (ref 70–99)
Potassium: 3.5 mmol/L (ref 3.5–5.1)
Sodium: 138 mmol/L (ref 135–145)

## 2018-11-29 LAB — CBC
HCT: 37.1 % — ABNORMAL LOW (ref 39.0–52.0)
Hemoglobin: 12.2 g/dL — ABNORMAL LOW (ref 13.0–17.0)
MCH: 26.3 pg (ref 26.0–34.0)
MCHC: 32.9 g/dL (ref 30.0–36.0)
MCV: 80 fL (ref 80.0–100.0)
Platelets: 214 10*3/uL (ref 150–400)
RBC: 4.64 MIL/uL (ref 4.22–5.81)
RDW: 14.4 % (ref 11.5–15.5)
WBC: 8.5 10*3/uL (ref 4.0–10.5)
nRBC: 0 % (ref 0.0–0.2)

## 2018-11-29 LAB — TROPONIN I: Troponin I: <0.03 ng/mL (ref <0.03)

## 2018-11-29 MED ORDER — AZITHROMYCIN 250 MG PO TABS: ORAL_TABLET | ORAL | 0 refills | Status: DC

## 2018-11-29 MED ORDER — RIVAROXABAN 15 MG PO TABS: 15.0000 mg | ORAL_TABLET | Freq: Two times a day (BID) | ORAL | 0 refills | Status: DC

## 2018-11-29 MED ORDER — FERROUS SULFATE 325 (65 FE) MG PO TABS
325.0000 mg | ORAL_TABLET | Freq: Every day | ORAL | Status: DC
  Administered 2018-11-30: 325 mg via ORAL
  Filled 2018-11-29: qty 1

## 2018-11-29 MED ORDER — CEFDINIR 300 MG PO CAPS
300.0000 mg | ORAL_CAPSULE | Freq: Two times a day (BID) | ORAL | Status: DC
  Administered 2018-11-29 – 2018-11-30 (×3): 300 mg via ORAL
  Filled 2018-11-29 (×4): qty 1

## 2018-11-29 MED ORDER — AZITHROMYCIN 250 MG PO TABS
250.0000 mg | ORAL_TABLET | Freq: Every day | ORAL | Status: DC
  Administered 2018-11-30: 250 mg via ORAL
  Filled 2018-11-29: qty 1

## 2018-11-29 MED ORDER — AZITHROMYCIN 250 MG PO TABS
500.0000 mg | ORAL_TABLET | Freq: Every day | ORAL | Status: AC
  Administered 2018-11-29: 500 mg via ORAL
  Filled 2018-11-29: qty 2

## 2018-11-29 MED ORDER — POLYETHYLENE GLYCOL 3350 17 G PO PACK: 17.0000 g | PACK | Freq: Every day | ORAL | 0 refills | Status: DC | PRN

## 2018-11-29 MED ORDER — CEFDINIR 300 MG PO CAPS: 300.0000 mg | ORAL_CAPSULE | Freq: Two times a day (BID) | ORAL | 0 refills | Status: DC

## 2018-11-29 MED ORDER — HYDROMORPHONE HCL 2 MG PO TABS: 2.0000 mg | ORAL_TABLET | Freq: Four times a day (QID) | ORAL | 0 refills | Status: AC | PRN

## 2018-11-29 MED ORDER — RIVAROXABAN (XARELTO) VTE STARTER PACK (15 & 20 MG): ORAL_TABLET | ORAL | 0 refills | Status: DC

## 2018-11-29 MED ORDER — SENNOSIDES-DOCUSATE SODIUM 8.6-50 MG PO TABS: 1.0000 | ORAL_TABLET | Freq: Two times a day (BID) | ORAL | Status: DC | PRN

## 2018-11-29 MED ORDER — HYDROMORPHONE HCL 2 MG PO TABS
2.0000 mg | ORAL_TABLET | Freq: Four times a day (QID) | ORAL | Status: DC | PRN
  Administered 2018-11-29 – 2018-11-30 (×2): 2 mg via ORAL
  Filled 2018-11-29 (×2): qty 1

## 2018-11-29 MED FILL — HYDROmorphone HCL 2 MG TABS: 2 | 5 days supply | Qty: 20 | Fill #0

## 2018-11-29 MED FILL — XARELTO STARTER PACK: 15 & 20 | 30 days supply | Qty: 51 | Fill #0

## 2018-11-29 MED FILL — AZITHROMYCIN 250 MG TABLET: 250 | 4 days supply | Qty: 4 | Fill #0

## 2018-11-29 MED FILL — CEFDINIR 300 MG CAPSULE: 300 | 4 days supply | Qty: 9 | Fill #0

## 2018-11-29 NOTE — Discharge Instructions (Signed)
Information on my medicine - XARELTO (rivaroxaban)  This medication education was reviewed with me or my healthcare representative as part of my discharge preparation.    WHY WAS XARELTO PRESCRIBED FOR YOU? Xarelto was prescribed to treat blood clots that may have been found in the veins of your legs (deep vein thrombosis) or in your lungs (pulmonary embolism) and to reduce the risk of them occurring again.  What do you need to know about Xarelto? The starting dose is one 15 mg tablet taken TWICE daily with food for the FIRST 21 DAYS then on (enter date)  12/19/18  the dose is changed to one 20 mg tablet taken ONCE A DAY with your evening meal.  DO NOT stop taking Xarelto without talking to the health care provider who prescribed the medication.  Refill your prescription for 20 mg tablets before you run out.  After discharge, you should have regular check-up appointments with your healthcare provider that is prescribing your Xarelto.  In the future your dose may need to be changed if your kidney function changes by a significant amount.  What do you do if you miss a dose? If you are taking Xarelto TWICE DAILY and you miss a dose, take it as soon as you remember. You may take two 15 mg tablets (total 30 mg) at the same time then resume your regularly scheduled 15 mg twice daily the next day.  If you are taking Xarelto ONCE DAILY and you miss a dose, take it as soon as you remember on the same day then continue your regularly scheduled once daily regimen the next day. Do not take two doses of Xarelto at the same time.   Important Safety Information Xarelto is a blood thinner medicine that can cause bleeding. You should call your healthcare provider right away if you experience any of the following: ? Bleeding from an injury or your nose that does not stop. ? Unusual colored urine (red or dark brown) or unusual colored stools (red or black). ? Unusual bruising for unknown reasons. ? A  serious fall or if you hit your head (even if there is no bleeding).  Some medicines may interact with Xarelto and might increase your risk of bleeding while on Xarelto. To help avoid this, consult your healthcare provider or pharmacist prior to using any new prescription or non-prescription medications, including herbals, vitamins, non-steroidal anti-inflammatory drugs (NSAIDs) and supplements.  This website has more information on Xarelto: [email or web address].[email or web address]

## 2018-11-29 NOTE — Discharge Summary (Signed)
Name: Jason [name] MRN: [id number] DOB: August 19, 1981 38 y.o. PCP: Patient, No Pcp Per  Date of Admission: 11/27/2018  5:31 AM Date of Discharge: 11/30/2018 Attending Physician: Dr. Daryll Drown  Discharge Diagnosis: 1. Multiple pulmonary emboli 2. Constipation 3. Hematuria 4. Iron deficiency 5. Chronic back pain  Discharge Medications: Allergies as of 11/30/2018      Reactions   Other Hives, Swelling   Antibiotic that was given when he had pneumonia       Medication List    STOP taking these medications   methocarbamol 500 MG tablet Commonly known as:  ROBAXIN     TAKE these medications   azithromycin 250 MG tablet Commonly known as:  ZITHROMAX Take one tablet daily.   azithromycin 250 MG tablet Commonly known as:  ZITHROMAX Take 1 tablet daily   cefdinir 300 MG capsule Commonly known as:  OMNICEF Take 1 capsule (300 mg total) by mouth every 12 (twelve) hours.   cefdinir 300 MG capsule Commonly known as:  OMNICEF Take 1 capsule (300 mg total) by mouth 2 (two) times daily.   ferrous sulfate 325 (65 FE) MG tablet Take 1 tablet (325 mg total) by mouth daily with breakfast. Start taking on:  December 01, 2018   gabapentin 300 MG capsule Commonly known as:  NEURONTIN Take 300 mg 3 (three) times daily by mouth.   HYDROmorphone 2 MG tablet Commonly known as:  DILAUDID Take 1 tablet (2 mg total) by mouth every 6 (six) hours as needed for up to 5 days for severe pain.   LUBRICANT EYE DROPS 0.4-0.3 % Soln Generic drug:  Polyethyl Glycol-Propyl Glycol Place 1-2 drops 3 (three) times daily as needed into both eyes (for dry/irritated eyes).   naproxen 375 MG tablet Commonly known as:  NAPROSYN Take 1 tablet (375 mg total) by mouth 2 (two) times daily with a meal.   oxycodone 30 MG immediate release tablet Commonly known as:  ROXICODONE 1 tab by mouth 6 times per day What changed:    how much to take  how to take this  when to take this  reasons to take  this  additional instructions  Another medication with the same name was removed. Continue taking this medication, and follow the directions you see here.   polyethylene glycol packet Commonly known as:  MIRALAX / GLYCOLAX Take 17 g by mouth daily as needed.   Rivaroxaban 15 & 20 MG Tbpk Take as directed on package: Start with one 15mg  tablet by mouth twice a day with food. On Day 22, switch to one 20mg  tablet once a day with food.   senna-docusate 8.6-50 MG tablet Commonly known as:  Senokot-S Take 1 tablet by mouth 2 (two) times daily as needed for mild constipation.       Disposition and follow-up:   JasonJason [name] was discharged from [facility] in Good condition.  At the hospital follow up visit please address:  1. 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  Labs / imaging needed at time of follow-up: consider hypercoagulability work-up  3.  Pending labs/ test needing follow-up: none  Follow-up Appointments: Follow-up Information    [place] Go  on 12/05/2018.   Why:  at 2pm for your hospital follow-up appointment and for establishing a primary care doctor; you can obtain discounted prescriptions for $4-$10.  Contact information: [place] [id number] [phone]          Hospital Course by problem list: 1. Multiple pulmonary emboli and possible CAP: [name] is a 38 yo man with a medical history of sickle cell trait and chronic back pain s/p lumbar laminectomy in 2018 who presented with three days of pleuritic RLQ and LLQ abdominal pain, subjective fevers, and chills. He  was hemodynamically stable on arrival. CTA chest showed multiple pulmonary emboliwith a suspected pulmonary infarct in the right posterolateral base without any signs of right heart strain. He was started on heparin and transitioned to Xarelto. He was also treated with additional analgesia for his pleuritic chest and abdominal pain. Unknown etiology. No provoking factors. Patient has sickle cell trait, which may confer an increased risk of PE. However, there are no management changes recommended for PE in a sickle cell trait patient vs in a non-sickle cell trait patient. He would benefit from a hypercoagulability workup as an outpatient. [name] continued to spike fevers during his hospitalization. The fevers were likely caused by the pulmonary emboli and pulmonary infarction rather than septic emboli or pneumonia. TTE was negative for vegetations. Blood cultures with no growth. However, because his original chest CT showed possible pneumonia and he was at higher risk of infection due to infarcted long, initiated treatment for CAP with 5 day courses of Azithromycin and Omnicef. He was still requiring breakthrough pain medication at discharge and was sent out with a 5-day course of PO dilaudid. He was discharged home with an appointment to [facility]   2. Constipation: Abdominal pain on admission likely 2/2 constipation in the setting of chronic opioid use. Patient was able to move his bowels after aggressive bowel regimen. His abdominal pain then resolved. He was discharged home on miralax and senokot.  3. Hematuria: Microscopic and gross hematuria since 2013. 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 He should f/u with urology as an outpatient.  4. Iron deficiency: Patient not anemic, but iron level low at 20. Started PO iron supplement.  5. Chronic back pain: Continued home oxycodone 20mg  q4hrs prn and  gabapentin 300mg  TID.  Discharge Vitals:   BP (!) 142/82 (BP Location: Left Arm)   Pulse 91   Temp (!) 102.5 F (39.2 C) (Oral)   Resp (!) 24   Ht 5\' 8"  (1.727 m)   Wt 68 kg   SpO2 97%   BMI 22.81 kg/m   Pertinent Labs, Studies, and Procedures:  CBC Latest Ref Rng & Units 11/29/2018 11/28/2018 11/28/2018  WBC 4.0 - 10.5 K/uL 8.5 11.5(H) 11.5(H)  Hemoglobin 13.0 - 17.0 g/dL 12.2(L) 13.3 13.1  Hematocrit 39.0 - 52.0 % 37.1(L) 40.3 39.9  Platelets 150 - 400 K/uL 214 201 198   CMP Latest Ref Rng & Units 11/29/2018 11/28/2018 11/27/2018  Glucose 70 - 99 mg/dL 108(H) 122(H) 130(H)  BUN 6 - 20 mg/dL 6 12 8   Creatinine 0.61 - 1.24 mg/dL 1.00 1.22 1.16  Sodium 135 - 145 mmol/L 138 138 136  Potassium 3.5 - 5.1 mmol/L 3.5 4.2 3.9  Chloride 98 - 111 mmol/L 104 103 101  CO2 22 - 32 mmol/L 26 29 23   Calcium 8.9 - 10.3 mg/dL 8.1(L)  8.4(L) 9.1  Total Protein 6.5 - 8.1 g/dL - 6.6 7.7  Total Bilirubin 0.3 - 1.2 mg/dL - 1.0 1.0  Alkaline Phos 38 - 126 U/L - 39 49  AST 15 - 41 U/L - 16 27  ALT 0 - 44 U/L - 16 25   Chest/Abd Xray 11/27/2018 1. No acute cardiopulmonary disease. 2. Nonobstructive bowel gas pattern with moderate colonic fecal Material.  CT renal stone study 11/27/2018 1. No acute findings are noted in the abdomen or pelvis to account for the patient's symptoms. 2. However, there are areas of peripheral airspace consolidation in the visualized lung bases concerning for multilobar pneumonia.  CTA Chest 11/27/2018 1. Multiple pulmonary emboli without appreciable right heart strain. 2. No thoracic aortic aneurysm. No dissection seen. Note that the contrast bolus is not sufficient in the aorta to assess meaningfully for potential aortic dissection. 3. Somewhat wedge-shaped airspace opacity in the posterolateral right base. Suspect pulmonary infarct with questionable superimposed pneumonia. Somewhat less well-defined lateral left base opacity. Question infarct with potential  superimposed pneumonia as well in this area. 4.  No appreciable thoracic adenopathy.  Discharge Instructions: Discharge Instructions    Discharge instructions   Complete by:  As directed    It was a pleasure taking care of you while you were in the hospital, Jason [name].  1. You were hospitalized for blood clots in your lungs. You were treated with blood thinners and pain medications. You should continue to take Xarelto 15mg  twice daily for the next twenty days. After twenty days, the dosing will be once a day. You will be on this medication for at least 3 months.   2. Please follow-up with [facility] to establish care. Our team has made an appointment for you there. They will determine how long you'll be on the Xarelto for.   3. Continue taking your home pain medications. I have prescribed a 5 day supply of oral dilaudid for breakthrough pain. You can take this every 6 hours as needed if you continue to have severe pain. Only take this medication when you need to. It will take days to weeks for the pain to completely resolve.   4. We will also treat you for a pneumonia since you have been having high fevers. Continue taking azithromycin and omnicef for a total of 7 days (your last day will be 2/19).  5. It is in your best interest to quit smoking and avoid drugs like cocaine.   6. You can take senokot or miralax over the counter for constipation to prevent abdominal pain like you had when you were first admitted.  Feel free to call our clinic at [phone] if you have any questions.  Thanks, Dr. Annie Paras   Increase activity slowly   Complete by:  As directed       Signed: Necie Wilcoxson, Andree Elk, MD 11/30/2018, 2:51 PM   Pager: [phone]

## 2018-11-29 NOTE — Care Management Note (Signed)
Case Management Note  Patient Details  Name: Jason [name] MRN: [id number] Date of Birth: 1981/02/10  Subjective/Objective:    From home, has a follow up apt with [facility], NCM assisted with [facility] and [place] will bring meds to patient room prior to dc. He is also on xarelto, will need to do patient ast at [facility] clinic , and while waiting on patient ast will be able to get refill for 10.00 at clinic.                 Action/Plan: DC home when ready.   Expected Discharge Date:  11/29/18               Expected Discharge Plan:  Home/Self Care  In-House Referral:  NA  Discharge planning Services  CM Consult, Follow-up appt scheduled, [facility], Medication Assistance, [facility](Xarelto card )  Post Acute Care Choice:  NA Choice offered to:  NA  DME Arranged:  N/A DME Agency:  NA  HH Arranged:  NA HH Agency:  NA  Status of Service:  Completed, signed off  If discussed at [place] of Stay Meetings, dates discussed:    Additional Comments:  Zenon Mayo, RN 11/29/2018, 12:23 PM

## 2018-11-29 NOTE — Telephone Encounter (Signed)
I called and spoke with patients mom, he is being discharged this afternoon and has an appt on Monday to see Dr. [name]

## 2018-11-29 NOTE — Progress Notes (Signed)
Pt c/o of sharp chest pain, MD notified (Dorrell). PRN pain medication given, will reassess. No new orders at this time.

## 2018-11-29 NOTE — Telephone Encounter (Signed)
I called and advised Jason [name] that we have not been prescribing this medication for him that it looks like Dr. Lily Peer has been prescribing him these meds.  She states that he is still in the hospital but he is supposed to be getting discharged this afternoon.  They provider at the hospital did give him Oxycodone 20 mg.  Patient has an appt on Monday to see Dr. [name]

## 2018-11-29 NOTE — Progress Notes (Signed)
Subjective: No overnight events. This morning, Jason [name] spiked another fever to 102 and developed sharp pleuritic chest pain on the right lower chest wall. When the team saw him he reported that his pain was better, but still present. He denies any abdominal pain today, he has had bowel movements. It was explained to the patient that his pain is consistent with his pulmonary emboli and that it will take a while to resolve completely.   Objective:  Vital signs in last 24 hours: Vitals:   11/28/18 1003 11/28/18 1236 11/28/18 1613 11/28/18 2300  BP:  108/68 103/82 106/78  Pulse:  90 64 74  Resp:  18    Temp: (!) 102.6 F (39.2 C) 99.2 F (37.3 C) 98.3 F (36.8 C) 98.7 F (37.1 C)  TempSrc: Oral Oral Oral Oral  SpO2:  97% 100% 94%  Weight:      Height:       Gen: lying comfortably in bed, no distress CV: RRR, no murmurs Pulm: CTAB, normal effort on room air Abd: soft, non-distended, non-tender  Assessment/Plan:  Active Problems:   Pulmonary embolism [facility])  Jason [name] is a 38 yo man with a medical history of sickle cell trait and chronic back pain s/p lumbar laminectomy in 2018 who presented with three days of pleuritic RLQ and LLQ abdominal pain, subjective fevers, and chills. He was afebrile and hemodynamically stable on arrival. CTA chest showed multiple pulmonary emboli with a suspected pulmonary infarct in the right posterolateral base. He was started on heparin and admitted for further evaluation and management.  Multiple Pulmonary Emboli - Hemodynamically stable. - His chest and back pain are pleuritic and consistent with PE. Repeat EKG was NSR without signs of right heart strain. Troponin negative.  - Unknown etiology. No provoking factors. Hypercoagulable workup would not be comprehensive/accurate while on heparin. Patient has sickle cell trait, which may confer an increased risk of PE. However, there are no management changes recommended for PE in a sickle cell  trait patient vs in a non-sickle cell trait patient. He would benefit from a hypercoagulability workup as an outpatient. - Patient has been afebrile for about 24 hours. His leukocytosis has resolved, decreasing from 11.5 to 8.5. The fever is likely caused by the pulmonary emboli and pulmonary infarction rather than septic emboli or pneumonia. TTE was negative for vegetations. Blood cultures with no growth to date. However, because patient has continued to spike fevers, his original chest CT showed possible pneumonia, and he is at higher risk of infection due to infarcted long, will initiate treatment for CAP.  - Will transition IV breakthrough fentanyl to PO dilaudid Plan - Telemetry  - Start azithromycin and omnicef for CAP - Continue Xarelto 15mg  BID. BID dosing for the first 21 days and then 20mg  daily after that. 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 His iron level is low at 20 with a saturation of 8%.  - Will hold off on any iron supplement  until infection has been ruled out   Chronic back pain - Continue home oxycodone 20mg  q4hrs prn and gabapentin 300mg  TID  Dispo: Anticipated discharge approximately today or tomorrow depending on whether patient's pain is controlled on the new all PO regimen.  Stokes Rattigan, Andree Elk, MD 11/29/2018, 6:07 AM Pager: [phone]

## 2018-11-29 NOTE — Telephone Encounter (Signed)
Pt mother called in again checking on the pt's medication refill she requested for Oxycodone 30MG  And she hasn't received a call stating the medication is ready for pick up, and her son needs that due to the surgery he just had done. [phone]

## 2018-11-30 DIAGNOSIS — Z7901 Long term (current) use of anticoagulants: Secondary | ICD-10-CM

## 2018-11-30 DIAGNOSIS — Z881 Allergy status to other antibiotic agents status: Secondary | ICD-10-CM

## 2018-11-30 DIAGNOSIS — Z79899 Other long term (current) drug therapy: Secondary | ICD-10-CM

## 2018-11-30 MED ORDER — AZITHROMYCIN 250 MG PO TABS: ORAL_TABLET | ORAL | 0 refills | Status: DC

## 2018-11-30 MED ORDER — CEFDINIR 300 MG PO CAPS: 300.0000 mg | ORAL_CAPSULE | Freq: Two times a day (BID) | ORAL | 0 refills | Status: DC

## 2018-11-30 MED ORDER — FERROUS SULFATE 325 (65 FE) MG PO TABS: 325.0000 mg | ORAL_TABLET | Freq: Every day | ORAL | 3 refills | Status: DC

## 2018-11-30 NOTE — Progress Notes (Signed)
   Subjective: Patient today says that he feels dizzy.  Says he is having some pleuritic epigastric abd pain and continued pleuritic chest discomfort.  He denies SOB at rest or with exertion. He has continued to have some mild hemoptysis. He feels okay with discharge home today. He was advised to see a medical provider if his hemoptysis worsens or if he begins to experience dyspnea.   Objective:  Vital signs in last 24 hours: Vitals:   11/29/18 0805 11/29/18 1400 11/29/18 1807 11/29/18 2243  BP: 118/80  132/87 130/90  Pulse: 90  (!) 53 91  Resp:    18  Temp: (!) 102.2 F (39 C) 100.3 F (37.9 C) 99.1 F (37.3 C) (!) 101.3 F (38.5 C)  TempSrc: Oral Oral Oral Oral  SpO2: 93%  100% 99%  Weight:      Height:       Gen: lying comfortably in bed, no distress CV: RRR, no murmurs Pulm: CTAB, normal effort on room air Abd: soft, non-distended, mild ttp in the epigastric region   Assessment/Plan:  Active Problems:   Pulmonary embolism [facility]  Mr. Duley is a 38 yo man with a medical history of sickle cell trait and chronic back pain s/p lumbar laminectomy in 2018 who presented with three days of pleuritic RLQ and LLQ abdominal pain, subjective fevers, and chills. He was afebrile and hemodynamically stable on arrival. CTA chest showed multiple pulmonary emboliwith a suspected pulmonary infarct in the right posterolateral base. He was started on heparin and admitted for further evaluation and management.  Multiple Pulmonary Emboli - Hemodynamically stable. - Pain is still pleuritic and present. He only required one dose of breakthrough dilaudid overnight. It was explained to him that this pain and mild hemoptysis may take a while to resolve as the blood clots are resorbed.  - Febrile to 101.3 this morning. Treating empirically for CAP. Will extend abx course to 7 days. Blood cultures without growth for 48 hours.  Plan - Telemetry - Continue azithromycin and omnicef (day  2/7) -Continue Xarelto 15mg  BID. BID dosing for the first 21 days and then 20mg  daily after that. - F/u blood cultures - Pain control with home oxycodone 20mg  q4hrs PRN plus dilaudid 2mg  q6hrs PRN for breakthrough pain  - He is medically stable for discharge home today. He has f[facility]u planned with [facility].   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 His iron level is low at 20 with a saturation of 8%.  - Start iron supplement today   Chronic back pain - Continue home oxycodone20mg  q4hrs prnand gabapentin 300mg  TID  Dispo: Anticipated discharge today  Dorrell, Andree Elk, MD 11/30/2018, 6:17 AM Pager: [phone]

## 2018-12-02 LAB — CULTURE, BLOOD (ROUTINE X 2)
Culture: NO GROWTH
Culture: NO GROWTH
SPECIAL REQUESTS: ADEQUATE
Special Requests: ADEQUATE

## 2018-12-03 ENCOUNTER — Ambulatory Visit (INDEPENDENT_AMBULATORY_CARE_PROVIDER_SITE_OTHER): Payer: Self-pay | Admitting: Specialist

## 2018-12-03 ENCOUNTER — Encounter (INDEPENDENT_AMBULATORY_CARE_PROVIDER_SITE_OTHER): Payer: Self-pay | Admitting: Specialist

## 2018-12-03 ENCOUNTER — Encounter (INDEPENDENT_AMBULATORY_CARE_PROVIDER_SITE_OTHER): Payer: Self-pay

## 2018-12-03 ENCOUNTER — Ambulatory Visit (INDEPENDENT_AMBULATORY_CARE_PROVIDER_SITE_OTHER): Payer: Self-pay

## 2018-12-03 ENCOUNTER — Emergency Department (HOSPITAL_COMMUNITY): Payer: Self-pay

## 2018-12-03 ENCOUNTER — Encounter (HOSPITAL_COMMUNITY): Payer: Self-pay | Admitting: Emergency Medicine

## 2018-12-03 ENCOUNTER — Telehealth (INDEPENDENT_AMBULATORY_CARE_PROVIDER_SITE_OTHER): Payer: Self-pay | Admitting: Radiology

## 2018-12-03 ENCOUNTER — Emergency Department (HOSPITAL_COMMUNITY)
Admission: EM | Admit: 2018-12-03 | Discharge: 2018-12-03 | Disposition: A | Discharge status: Home / Self Care | Payer: Self-pay | Attending: Emergency Medicine | Admitting: Emergency Medicine

## 2018-12-03 ENCOUNTER — Other Ambulatory Visit: Payer: Self-pay

## 2018-12-03 VITALS — BP 131/84 | HR 103 | Ht 68.0 in | Wt 150.0 lb

## 2018-12-03 DIAGNOSIS — Z87891 Personal history of nicotine dependence: Secondary | ICD-10-CM | POA: Insufficient documentation

## 2018-12-03 DIAGNOSIS — Z5321 Procedure and treatment not carried out due to patient leaving prior to being seen by health care provider: Secondary | ICD-10-CM

## 2018-12-03 DIAGNOSIS — K529 Noninfective gastroenteritis and colitis, unspecified: Secondary | ICD-10-CM | POA: Insufficient documentation

## 2018-12-03 DIAGNOSIS — M542 Cervicalgia: Secondary | ICD-10-CM

## 2018-12-03 DIAGNOSIS — M5136 Other intervertebral disc degeneration, lumbar region: Secondary | ICD-10-CM

## 2018-12-03 LAB — CBC WITH DIFFERENTIAL/PLATELET
Abs Immature Granulocytes: 0.14 10*3/uL — ABNORMAL HIGH (ref 0.00–0.07)
BASOS PCT: 0 %
Basophils Absolute: 0.1 10*3/uL (ref 0.0–0.1)
Eosinophils Absolute: 0.3 10*3/uL (ref 0.0–0.5)
Eosinophils Relative: 2 %
HCT: 46.1 % (ref 39.0–52.0)
Hemoglobin: 15 g/dL (ref 13.0–17.0)
Immature Granulocytes: 1 %
Lymphocytes Relative: 15 %
Lymphs Abs: 2 10*3/uL (ref 0.7–4.0)
MCH: 26.4 pg (ref 26.0–34.0)
MCHC: 32.5 g/dL (ref 30.0–36.0)
MCV: 81 fL (ref 80.0–100.0)
Monocytes Absolute: 1.3 10*3/uL — ABNORMAL HIGH (ref 0.1–1.0)
Monocytes Relative: 10 %
Neutro Abs: 9.9 10*3/uL — ABNORMAL HIGH (ref 1.7–7.7)
Neutrophils Relative %: 72 %
PLATELETS: 536 10*3/uL — AB (ref 150–400)
RBC: 5.69 MIL/uL (ref 4.22–5.81)
RDW: 14.6 % (ref 11.5–15.5)
WBC: 13.7 10*3/uL — ABNORMAL HIGH (ref 4.0–10.5)
nRBC: 0 % (ref 0.0–0.2)

## 2018-12-03 LAB — COMPREHENSIVE METABOLIC PANEL
ALT: 35 U/L (ref 0–44)
AST: 27 U/L (ref 15–41)
Albumin: 3.8 g/dL (ref 3.5–5.0)
Alkaline Phosphatase: 58 U/L (ref 38–126)
Anion gap: 13 (ref 5–15)
BUN: 5 mg/dL — AB (ref 6–20)
CO2: 19 mmol/L — ABNORMAL LOW (ref 22–32)
Calcium: 9.8 mg/dL (ref 8.9–10.3)
Chloride: 109 mmol/L (ref 98–111)
Creatinine, Ser: 1.16 mg/dL (ref 0.61–1.24)
GFR calc Af Amer: >60 mL/min (ref >60)
GFR calc non Af Amer: >60 mL/min (ref >60)
Glucose, Bld: 158 mg/dL — ABNORMAL HIGH (ref 70–99)
Potassium: 4.1 mmol/L (ref 3.5–5.1)
Sodium: 141 mmol/L (ref 135–145)
TOTAL PROTEIN: 8.5 g/dL — AB (ref 6.5–8.1)
Total Bilirubin: 0.6 mg/dL (ref 0.3–1.2)

## 2018-12-03 LAB — INFLUENZA PANEL BY PCR (TYPE A & B)
Influenza A By PCR: NEGATIVE
Influenza B By PCR: NEGATIVE

## 2018-12-03 LAB — LIPASE, BLOOD: Lipase: 23 U/L (ref 11–51)

## 2018-12-03 LAB — LACTIC ACID, PLASMA: Lactic Acid, Venous: 2.1 mmol/L (ref 0.5–1.9)

## 2018-12-03 LAB — TROPONIN I

## 2018-12-03 MED ORDER — PROMETHAZINE HCL 25 MG PO TABS: 25.0000 mg | ORAL_TABLET | Freq: Four times a day (QID) | ORAL | 0 refills | Status: DC | PRN

## 2018-12-03 MED ORDER — ONDANSETRON 4 MG PO TBDP: 4.0000 mg | ORAL_TABLET | Freq: Three times a day (TID) | ORAL | 0 refills | Status: DC | PRN

## 2018-12-03 MED ORDER — HYDROMORPHONE HCL 1 MG/ML IJ SOLN
1.0000 mg | Freq: Once | INTRAMUSCULAR | Status: AC
  Administered 2018-12-03: 1 mg via INTRAVENOUS
  Filled 2018-12-03: qty 1

## 2018-12-03 MED ORDER — IOPAMIDOL (ISOVUE-370) INJECTION 76%
100.0000 mL | Freq: Once | INTRAVENOUS | Status: AC | PRN
  Administered 2018-12-03: 100 mL via INTRAVENOUS

## 2018-12-03 MED ORDER — ONDANSETRON HCL 4 MG/2ML IJ SOLN
4.0000 mg | Freq: Once | INTRAMUSCULAR | Status: AC
  Administered 2018-12-03: 4 mg via INTRAVENOUS
  Filled 2018-12-03: qty 2

## 2018-12-03 MED ORDER — SODIUM CHLORIDE 0.9 % IV BOLUS
1000.0000 mL | Freq: Once | INTRAVENOUS | Status: AC
  Administered 2018-12-03: 1000 mL via INTRAVENOUS

## 2018-12-03 MED ORDER — LACTATED RINGERS IV BOLUS
1000.0000 mL | Freq: Once | INTRAVENOUS | Status: AC
  Administered 2018-12-03: 1000 mL via INTRAVENOUS

## 2018-12-03 MED ORDER — IOPAMIDOL (ISOVUE-370) INJECTION 76%
INTRAVENOUS | Status: AC
  Filled 2018-12-03: qty 100

## 2018-12-03 NOTE — Discharge Instructions (Signed)
If you develop worsening, continued, or recurrent abdominal pain, uncontrolled vomiting, fever, chest or back pain, or any other new/concerning symptoms then return to the ER for evaluation.  

## 2018-12-03 NOTE — ED Triage Notes (Addendum)
Pt in from home with weakness, fevers, sob and n/v/d. States recently dx with PE's, also started Azithromycin for possible PNA. N/v/d x 3 days, states sob is worse today, sats 99% on RA. Also c/o fevers and incr weakness and LLQ abd pain

## 2018-12-03 NOTE — ED Provider Notes (Addendum)
[facility] DEPART[facility] Provider Note   CSN: [id number] Arrival date & time:        History   Chief Complaint Chief Complaint  Patient presents with  . Weakness  . Fever  . Abdominal Pain    HPI Jason [name] is a 38 y.o. male.  HPI  39 year old male presents with chest pain or shortness of breath.  Brought in by EMS.  Patient was recently diagnosed with PE and started on antibiotics and Xarelto.  He states that he is been continuing to have back pain and abdominal pain since.  Now starting to cough up some blood over the last 2 days.  Still feels short of breath and with chest pain.  He is also developed diarrhea which she had previously, but went away, and has come back.  He was prescribed Dilaudid for pain and states he is out of this over the last day or so.  He reports fevers at home though he has not checked his temperature. Started having vomiting yesterday.  Past Medical History:  Diagnosis Date  . Anxiety   . Back injury   . Cervical disc disease   . Chronic back pain   . Chronic low back pain 03/29/2016  . Family history of adverse reaction to anesthesia   . GERD (gastroesophageal reflux disease)   . History of kidney stones    per mother- "there was a diagnoses at some point"  . Insomnia   . Lumbar disc disease   . MIGRAINE, COMMON 09/23/2010   Qualifier: Diagnosis of  By: Jenny Reichmann MD, Hunt Oris   . Pneumonia   . Sickle cell trait [facility])     Patient Active Problem List   Diagnosis Date Noted  . Pulmonary embolism (Early) 11/27/2018  . S/P lumbar laminectomy 08/29/2017  . Spinal stenosis of lumbar region 08/28/2017    Class: Chronic  . Status post lumbar microdiscectomy 08/28/2017  . 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 Gross hematuria 04/01/2016  . Depression 04/01/2016  . Chronic low back pain 03/29/2016  . Cough 08/05/2015  . Encounter for well adult exam with  abnormal findings 02/17/2015  . Lumbar disc disease   . Cervical disc disease   . GERD (gastroesophageal reflux disease)   . Anxiety   . Insomnia   . CONSTIPATION 10/05/2010  . MIGRAINE, COMMON 09/23/2010  . Abdominal pain, other specified site 09/23/2010    Past Surgical History:  Procedure Laterality Date  . BACK SURGERY    . LUMBAR LAMINECTOMY/DECOMPRESSION MICRODISCECTOMY Bilateral 08/28/2017   Procedure: BILATERAL PARTIAL HEMILAMINECTOMIES L4-5 WITH MICRODISCECTOMY;  Surgeon: Jessy Oto, MD;  Location: [facility];  Service: Orthopedics;  Laterality: Bilateral;  . NO PAST SURGERIES          Home Medications    Prior to Admission medications   Medication Sig Start Date End Date Taking? Authorizing Provider  azithromycin (ZITHROMAX) 250 MG tablet Take one tablet daily. Patient taking differently: Take 250 mg by mouth daily.  11/29/18  Yes Dorrell, Andree Elk, MD  cefdinir (OMNICEF) 300 MG capsule Take 1 capsule (300 mg total) by mouth every 12 (twelve) hours. 11/29/18  Yes Dorrell, Andree Elk, MD  HYDROmorphone (DILAUDID) 2 MG tablet Take 1 tablet (2 mg total) by mouth every 6 (six) hours as needed for up to 5 days for severe pain. 11/29/18 12/04/18 Yes Dorrell, Andree Elk, MD  Rivaroxaban 15 & 20 MG  TBPK Take as directed on package: Start with one 15mg  tablet by mouth twice a day with food. On Day 22, switch to one 20mg  tablet once a day with food. 11/29/18  Yes Dorrell, Andree Elk, MD  azithromycin (ZITHROMAX) 250 MG tablet Take 1 tablet daily Patient not taking: Reported on 12/03/2018 11/30/18   Dorrell, Andree Elk, MD  cefdinir (OMNICEF) 300 MG capsule Take 1 capsule (300 mg total) by mouth 2 (two) times daily. Patient not taking: Reported on 12/03/2018 11/30/18   Dorrell, Andree Elk, MD  ferrous sulfate 325 (65 FE) MG tablet Take 1 tablet (325 mg total) by mouth daily with breakfast. 12/01/18   Dorrell, Andree Elk, MD  naproxen (NAPROSYN) 375 MG tablet Take 1 tablet (375 mg total) by mouth 2  (two) times daily with a meal. Patient not taking: Reported on 11/27/2018 09/21/17   Waynetta Pean, PA-C  ondansetron (ZOFRAN ODT) 4 MG disintegrating tablet Take 1 tablet (4 mg total) by mouth every 8 (eight) hours as needed for nausea or vomiting. 12/03/18   Sherwood Gambler, MD  oxycodone (ROXICODONE) 30 MG immediate release tablet 1 tab by mouth 6 times per day Patient not taking: Reported on 12/03/2018 03/15/18   Biagio Borg, MD  polyethylene glycol [facility] Floria Raveling) packet Take 17 g by mouth daily as needed. 11/29/18   Dorrell, Andree Elk, MD  promethazine (PHENERGAN) 25 MG tablet Take 1 tablet (25 mg total) by mouth every 6 (six) hours as needed for nausea or vomiting. 12/03/18   Sherwood Gambler, MD  senna-docusate (SENOKOT-S) 8.6-50 MG tablet Take 1 tablet by mouth 2 (two) times daily as needed for mild constipation. 11/29/18   Dorrell, Andree Elk, MD    Family History Family History  Problem Relation Age of Onset  . Congestive Heart Failure Father   . Hypertension Mother   . Hyperlipidemia Mother     Social History Social History   Tobacco Use  . Smoking status: Former Smoker    Packs/day: 1.00  . Smokeless tobacco: Never Used  Substance Use Topics  . Alcohol use: No    Frequency: Never    Comment: former use - heavy drinker 8 years ago  . Drug use: No     Allergies   Other   Review of Systems Review of Systems  Constitutional: Positive for fever (subjective).  Respiratory: Positive for cough and shortness of breath.   Cardiovascular: Positive for chest pain.  Gastrointestinal: Positive for abdominal pain and diarrhea.  Musculoskeletal: Positive for back pain.  All other systems reviewed and are negative.    Physical Exam Updated Vital Signs BP 122/87   Temp 97.6 F (36.4 C) (Oral)   Resp (!) 21   Wt 68 kg   SpO2 99%   BMI 22.79 kg/m   Physical Exam Vitals signs and nursing note reviewed.  Constitutional:      Appearance: He is well-developed. He is  not diaphoretic.  HENT:     Head: Normocephalic and atraumatic.     Right Ear: External ear normal.     Left Ear: External ear normal.     Nose: Nose normal.  Eyes:     General:        Right eye: No discharge.        Left eye: No discharge.  Neck:     Musculoskeletal: Neck supple.  Cardiovascular:     Rate and Rhythm: Regular rhythm. Tachycardia present.     Heart sounds: Normal heart sounds.  Pulmonary:     Effort: Pulmonary effort is normal. Tachypnea present. No accessory muscle usage or respiratory distress.     Breath sounds: Normal breath sounds. No wheezing or rales.  Abdominal:     Palpations: Abdomen is soft.     Tenderness: There is abdominal tenderness in the right upper quadrant and left upper quadrant.  Skin:    General: Skin is warm and dry.  Neurological:     Mental Status: He is alert.  Psychiatric:        Mood and Affect: Mood is not anxious.      ED Treatments / Results  Labs (all labs ordered are listed, but only abnormal results are displayed) Labs Reviewed  COMPREHENSIVE METABOLIC PANEL - Abnormal; Notable for the following components:      Result Value   CO2 19 (*)    Glucose, Bld 158 (*)    BUN 5 (*)    Total Protein 8.5 (*)    All other components within normal limits  CBC WITH DIFFERENTIAL/PLATELET - Abnormal; Notable for the following components:   WBC 13.7 (*)    Platelets 536 (*)    Neutro Abs 9.9 (*)    Monocytes Absolute 1.3 (*)    Abs Immature Granulocytes 0.14 (*)    All other components within normal limits  LACTIC ACID, PLASMA - Abnormal; Notable for the following components:   Lactic Acid, Venous 2.1 (*)    All other components within normal limits  C DIFFICILE QUICK SCREEN W PCR REFLEX  LIPASE, BLOOD  TROPONIN I  INFLUENZA PANEL BY PCR (TYPE A & B)  LACTIC ACID, PLASMA    EKG EKG Interpretation  Date/Time:  Monday December 03 2018 09:18:14 EST Ventricular Rate:  108 PR Interval:    QRS Duration: 78 QT  Interval:  339 QTC Calculation: 455 R Axis:   80 Text Interpretation:  Sinus tachycardia Biatrial enlargement rate is faster compared to Nov 29 2018 Confirmed by Sherwood Gambler [phone]) on 12/03/2018 9:27:26 AM Also confirmed by Sherwood Gambler [phone]), editor Hattie Perch ([phone])  on 12/03/2018 11:00:17 AM   Radiology Dg Chest 2 View  Result Date: 12/03/2018 CLINICAL DATA:  Cough and dyspnea EXAM: CHEST - 2 VIEW COMPARISON:  Chest CT 6 days ago. FINDINGS: Lateral view is degraded by obliquity. Mild blunting of the lateral left costophrenic sulcus. Mild haziness over the right diaphragm. These are areas of lung infarct seen on recent chest CT. No edema or cardiomegaly. Vertical density overlapping the upper aorta is attributed to rotation and was also seen on 11/27/2018 chest x-ray. IMPRESSION: Mild hazy density at areas of lung infarct on recent chest CT. No new abnormality. Electronically Signed   By: Monte Fantasia M.D.   On: 12/03/2018 10:40   Ct Angio Chest Pe W And/or Wo Contrast  Result Date: 12/03/2018 CLINICAL DATA:  Fevers, shortness of breath, and nausea, vomiting, and diarrhea. Recent pulmonary emboli. EXAM: CT ANGIOGRAPHY CHEST CT ABDOMEN AND PELVIS WITH CONTRAST TECHNIQUE: Multidetector CT imaging of the chest was performed using the standard protocol during bolus administration of intravenous contrast. Multiplanar CT image reconstructions and MIPs were obtained to evaluate the vascular anatomy. Multidetector CT imaging of the abdomen and pelvis was performed using the standard protocol during bolus administration of intravenous contrast. CONTRAST:  161mL ISOVUE-370 IOPAMIDOL (ISOVUE-370) INJECTION 76% COMPARISON:  CTA chest and CT abdomen pelvis dated November 27, 2018. FINDINGS: CTA CHEST FINDINGS Cardiovascular: Bilateral lower lobe acute segmental pulmonary emboli are again identified, with  overall improved clot burden when compared to prior study. For example, emboli within the  left posterior basal segmental pulmonary artery have completely resolved. No new pulmonary emboli. No evidence of right heart strain. No thoracic aortic aneurysm or dissection. Normal heart size. No significant pericardial effusion. Mediastinum/Nodes: No enlarged mediastinal, hilar, or axillary lymph nodes. Thyroid gland, trachea, and esophagus demonstrate no significant findings. Lungs/Pleura: Unchanged upper lobe paraseptal emphysema. Slightly increased density of the right posterior basal and left lateral basal segmental pulmonary infarcts. No pleural effusion or pneumothorax. Musculoskeletal: No chest wall abnormality. No acute or significant osseous findings. Review of the MIP images confirms the above findings. CT ABDOMEN and PELVIS FINDINGS Hepatobiliary: No focal liver abnormality is seen. No gallstones, gallbladder wall thickening, or biliary dilatation. Pancreas: Unremarkable. No pancreatic ductal dilatation or surrounding inflammatory changes. Spleen: Normal in size without focal abnormality. Adrenals/Urinary Tract: Adrenal glands are unremarkable. Tiny subcentimeter low-density lesion in the right kidney is too small to characterize. Kidneys are otherwise normal, without renal calculi, focal lesion, or hydronephrosis. Bladder is unremarkable for the degree of distention. Stomach/Bowel: Stomach is within normal limits. Appendix is not identified, but there are no signs of inflammation at the base of the cecum. There are a few prominent fluid-filled loops of small bowel. No evidence of bowel wall thickening, obstruction, or inflammatory changes. Vascular/Lymphatic: No significant vascular findings are present. No enlarged abdominal or pelvic lymph nodes. Reproductive: Prostate is unremarkable. Other: Trace free fluid in the pelvis.  No pneumoperitoneum. Musculoskeletal: No acute or significant osseous findings. Review of the MIP images confirms the above findings. IMPRESSION: Chest: 1. Improved clot burden  within the bilateral lower lobe segmental pulmonary arteries with expected evolution of left lateral basal and right posterior basal segmental pulmonary infarcts. 2.  Emphysema (ICD10-J43.9). Abdomen and pelvis: 1. Several prominent fluid-filled loops of small bowel may reflect enteritis. No obstruction. 2. Trace free fluid in the pelvis is nonspecific, but likely reactive. Electronically Signed   By: Titus Dubin M.D.   On: 12/03/2018 13:10   Ct Abdomen Pelvis W Contrast  Result Date: 12/03/2018 CLINICAL DATA:  Fevers, shortness of breath, and nausea, vomiting, and diarrhea. Recent pulmonary emboli. EXAM: CT ANGIOGRAPHY CHEST CT ABDOMEN AND PELVIS WITH CONTRAST TECHNIQUE: Multidetector CT imaging of the chest was performed using the standard protocol during bolus administration of intravenous contrast. Multiplanar CT image reconstructions and MIPs were obtained to evaluate the vascular anatomy. Multidetector CT imaging of the abdomen and pelvis was performed using the standard protocol during bolus administration of intravenous contrast. CONTRAST:  151mL ISOVUE-370 IOPAMIDOL (ISOVUE-370) INJECTION 76% COMPARISON:  CTA chest and CT abdomen pelvis dated November 27, 2018. FINDINGS: CTA CHEST FINDINGS Cardiovascular: Bilateral lower lobe acute segmental pulmonary emboli are again identified, with overall improved clot burden when compared to prior study. For example, emboli within the left posterior basal segmental pulmonary artery have completely resolved. No new pulmonary emboli. No evidence of right heart strain. No thoracic aortic aneurysm or dissection. Normal heart size. No significant pericardial effusion. Mediastinum/Nodes: No enlarged mediastinal, hilar, or axillary lymph nodes. Thyroid gland, trachea, and esophagus demonstrate no significant findings. Lungs/Pleura: Unchanged upper lobe paraseptal emphysema. Slightly increased density of the right posterior basal and left lateral basal segmental  pulmonary infarcts. No pleural effusion or pneumothorax. Musculoskeletal: No chest wall abnormality. No acute or significant osseous findings. Review of the MIP images confirms the above findings. CT ABDOMEN and PELVIS FINDINGS Hepatobiliary: No focal liver abnormality is seen. No gallstones, gallbladder wall thickening,  or biliary dilatation. Pancreas: Unremarkable. No pancreatic ductal dilatation or surrounding inflammatory changes. Spleen: Normal in size without focal abnormality. Adrenals/Urinary Tract: Adrenal glands are unremarkable. Tiny subcentimeter low-density lesion in the right kidney is too small to characterize. Kidneys are otherwise normal, without renal calculi, focal lesion, or hydronephrosis. Bladder is unremarkable for the degree of distention. Stomach/Bowel: Stomach is within normal limits. Appendix is not identified, but there are no signs of inflammation at the base of the cecum. There are a few prominent fluid-filled loops of small bowel. No evidence of bowel wall thickening, obstruction, or inflammatory changes. Vascular/Lymphatic: No significant vascular findings are present. No enlarged abdominal or pelvic lymph nodes. Reproductive: Prostate is unremarkable. Other: Trace free fluid in the pelvis.  No pneumoperitoneum. Musculoskeletal: No acute or significant osseous findings. Review of the MIP images confirms the above findings. IMPRESSION: Chest: 1. Improved clot burden within the bilateral lower lobe segmental pulmonary arteries with expected evolution of left lateral basal and right posterior basal segmental pulmonary infarcts. 2.  Emphysema (ICD10-J43.9). Abdomen and pelvis: 1. Several prominent fluid-filled loops of small bowel may reflect enteritis. No obstruction. 2. Trace free fluid in the pelvis is nonspecific, but likely reactive. Electronically Signed   By: Titus Dubin M.D.   On: 12/03/2018 13:10    Procedures Procedures (including critical care time)  Medications  Ordered in ED Medications  iopamidol (ISOVUE-370) 76 % injection (has no administration in time range)  HYDROmorphone (DILAUDID) injection 1 mg (1 mg Intravenous Given 12/03/18 1029)  sodium chloride 0.9 % bolus 1,000 mL (0 mLs Intravenous Stopped 12/03/18 1142)  ondansetron (ZOFRAN) injection 4 mg (4 mg Intravenous Given 12/03/18 1029)  lactated ringers bolus 1,000 mL (0 mLs Intravenous Stopped 12/03/18 1459)  HYDROmorphone (DILAUDID) injection 1 mg (1 mg Intravenous Given 12/03/18 1204)  iopamidol (ISOVUE-370) 76 % injection 100 mL (100 mLs Intravenous Contrast Given 12/03/18 1222)     Initial Impression / Assessment and Plan / ED Course  I have reviewed the triage vital signs and the nursing notes.  Pertinent labs & imaging results that were available during my care of the patient were reviewed by me and considered in my medical decision making (see chart for details).     Patient is tachycardic on arrival and was given IV fluids and pain/nausea control.  With his vomiting and diarrhea, most likely this is all gastroenteritis.  However with recent admission, he was worked up for other acute causes such as pneumonia or influenza.  CT abdomen pelvis shows enteritis but no other findings.  PE burden is improved.  Likely still has the infarct but no other pneumonia.  He is already on antibiotics.  He was unable to give a sample for C. difficile but this is probably more gastroenteritis with the vomiting component.  No colitis seen on CT.  He is feeling much better and is tolerating oral fluids.  Mild lactic acidosis probably more from dehydration rather than sepsis.  He will need to increase fluids at home and follow-up closely with PCP.  He noted some blood when coughing but he has not been coughing here and has had no blood in either emesis or cough at this time.  Final Clinical Impressions(s) / ED Diagnoses   Final diagnoses:  Acute gastroenteritis    ED Discharge Orders         Ordered     ondansetron (ZOFRAN ODT) 4 MG disintegrating tablet  Every 8 hours PRN     12/03/18 1433  promethazine (PHENERGAN) 25 MG tablet  Every 6 hours PRN     12/03/18 1433           Sherwood Gambler, MD 12/03/18 1552    Sherwood Gambler, MD 12/03/18 [phone]

## 2018-12-03 NOTE — ED Notes (Signed)
Patient transported to CT 

## 2018-12-03 NOTE — ED Notes (Signed)
Patient given ginger ale-tolerated well

## 2018-12-03 NOTE — Telephone Encounter (Signed)
Patient was in the office today to be seen by Dr. [name], however while trying to get xrays he was shivering and shaking so bad that we could not get the xrays, then he began to vomit and at this point we made the decision that he should go home and rest and that we would reschedule his appointment, r/s to 12/06/2018 @ 9am. Patient was in the ER prior to his visit today for a stomach virus.

## 2018-12-03 NOTE — ED Notes (Signed)
Got patient on the monitor did ekg shown to Dr Goldston patient is resting with call bell in reach 

## 2018-12-04 DIAGNOSIS — E872 Acidosis, unspecified: Secondary | ICD-10-CM | POA: Insufficient documentation

## 2018-12-05 ENCOUNTER — Telehealth (INDEPENDENT_AMBULATORY_CARE_PROVIDER_SITE_OTHER): Payer: Self-pay | Admitting: Specialist

## 2018-12-05 ENCOUNTER — Inpatient Hospital Stay: Payer: Self-pay | Admitting: Internal Medicine

## 2018-12-05 DIAGNOSIS — D473 Essential (hemorrhagic) thrombocythemia: Secondary | ICD-10-CM | POA: Insufficient documentation

## 2018-12-05 DIAGNOSIS — D75839 Thrombocytosis, unspecified: Secondary | ICD-10-CM | POA: Insufficient documentation

## 2018-12-05 NOTE — Telephone Encounter (Signed)
I called and spoke to [facility] and she states that Patryk is at [facility] in the hospital and has been there for 2 days.  She will call me back once he has been discharged and I will get him worked in to see Dr. [name] as soon as I can.

## 2018-12-05 NOTE — Telephone Encounter (Signed)
Pt mother came into the office and asked to speak to [place] but I advised that she was with patients at the moment. She has some concerns she'd like to go over with christy about her son's condition.  [phone]

## 2018-12-06 ENCOUNTER — Ambulatory Visit (INDEPENDENT_AMBULATORY_CARE_PROVIDER_SITE_OTHER): Payer: Self-pay | Admitting: Specialist

## 2018-12-11 ENCOUNTER — Other Ambulatory Visit: Payer: Self-pay

## 2018-12-11 ENCOUNTER — Ambulatory Visit: Payer: Self-pay | Admitting: Internal Medicine

## 2018-12-11 ENCOUNTER — Encounter: Payer: Self-pay | Admitting: Internal Medicine

## 2018-12-11 VITALS — BP 130/72 | HR 98 | Temp 98.1°F | Ht 70.8 in | Wt 152.2 lb

## 2018-12-11 DIAGNOSIS — G8921 Chronic pain due to trauma: Secondary | ICD-10-CM

## 2018-12-11 DIAGNOSIS — I2601 Septic pulmonary embolism with acute cor pulmonale: Secondary | ICD-10-CM

## 2018-12-11 DIAGNOSIS — D72828 Other elevated white blood cell count: Secondary | ICD-10-CM

## 2018-12-11 DIAGNOSIS — M5442 Lumbago with sciatica, left side: Secondary | ICD-10-CM

## 2018-12-11 DIAGNOSIS — K5903 Drug induced constipation: Secondary | ICD-10-CM

## 2018-12-11 DIAGNOSIS — M5412 Radiculopathy, cervical region: Secondary | ICD-10-CM

## 2018-12-11 DIAGNOSIS — F1410 Cocaine abuse, uncomplicated: Secondary | ICD-10-CM | POA: Insufficient documentation

## 2018-12-11 DIAGNOSIS — M5441 Lumbago with sciatica, right side: Secondary | ICD-10-CM

## 2018-12-11 DIAGNOSIS — M542 Cervicalgia: Secondary | ICD-10-CM

## 2018-12-11 DIAGNOSIS — R3129 Other microscopic hematuria: Secondary | ICD-10-CM

## 2018-12-11 DIAGNOSIS — Z712 Person consulting for explanation of examination or test findings: Secondary | ICD-10-CM

## 2018-12-11 DIAGNOSIS — G8929 Other chronic pain: Secondary | ICD-10-CM

## 2018-12-11 LAB — POCT URINALYSIS DIPSTICK
Bilirubin, UA: NEGATIVE
Glucose, UA: NEGATIVE
Ketones, UA: NEGATIVE
Leukocytes, UA: NEGATIVE
Nitrite, UA: NEGATIVE
Protein, UA: NEGATIVE
SPEC GRAV UA: 1.025 (ref 1.010–1.025)
UROBILINOGEN UA: 0.2 U/dL
pH, UA: 6 (ref 5.0–8.0)

## 2018-12-11 MED ORDER — GABAPENTIN 100 MG PO CAPS: ORAL_CAPSULE | ORAL | 0 refills | Status: DC

## 2018-12-11 MED ORDER — DOCUSATE SODIUM 100 MG PO CAPS: 100.0000 mg | ORAL_CAPSULE | Freq: Every day | ORAL | 0 refills | Status: DC | PRN

## 2018-12-11 NOTE — Patient Instructions (Addendum)
For chronic pain I will start you on gabapentin, until you are seen by the pain Dr.    Pulmonary Embolism  A pulmonary embolism (PE) is a sudden blockage or decrease of blood flow in one lung or both lungs. Most blockages come from a blood clot that forms in a lower leg, thigh, or arm vein (deep vein thrombosis, DVT) and travels to the lungs. A clot is blood that has thickened into a gel or solid. PE is a dangerous and life-threatening condition that needs to be treated right away. What are the causes? This condition is usually caused by a blood clot that forms in a vein and moves to the lungs. In rare cases, it may be caused by air, fat, part of a tumor, or other tissue that moves through the veins and into the lungs. What increases the risk? The following factors may make you more likely to develop this condition:  Traumatic injury, such as breaking a hip or leg.  Spinal cord injury.  Orthopedic surgery, especially hip or knee replacement.  Any major surgery.  Stroke.  Having DVT.  Blood clots or blood clotting disease.  Long-term (chronic) lung or heart disease.  Taking medicines that contain estrogen. These include birth control pills and hormone replacement therapy.  Cancer and chemotherapy.  Having a central venous catheter.  Pregnancy and the period of time after delivery (postpartum).  Being older than age 60.  Being overweight.  Smoking. What are the signs or symptoms? Symptoms of this condition usually start suddenly and include:  Shortness of breath during activity or at rest.  Coughing or coughing up blood or blood-tinged mucus.  Chest pain that is often worse with deep breaths.  Rapid or irregular heartbeat.  Feeling light-headed or dizzy.  Fainting.  Feeling anxious.  Fever.  Sweating.  Pain and swelling in a leg. This is a symptom of DVT, which can lead to PE. How is this diagnosed? This condition may be diagnosed based on:  Your medical  history.  A physical exam.  Blood tests.  CT pulmonary angiogram. This test checks blood flow in and around your lungs.  Ventilation-perfusion scan, also called a lung VQ scan. This test measures air flow and blood flow to the lungs.  Ultrasound of the legs. How is this treated? Treatment for this condition depends on many factors, such as the cause of your PE, your risk for bleeding or developing more clots, and other medical conditions you have. Treatment aims to remove, dissolve, or stop blood clots from forming or growing larger. Treatment may include:  Medicines, such as: ? Blood thinning medicines (anticoagulants) to stop clots from forming or growing. ? Medicines that dissolve clots (thrombolytics).  Procedures, such as: ? Using a flexible tube to remove a blood clot (embolectomy) or deliver medicine to destroy it (catheter-directed thrombolysis). ? Inserting a filter into a large vein that carries blood to the heart (inferior vena cava). This filter (vena cava filter) catches blood clots before they reach the lungs. ? Surgery to remove the clot (surgical embolectomy). This is rare. You may need a combination of immediate, long-term (up to 3 months after diagnosis), and extended (more than 3 months after diagnosis) treatments. Your treatment may continue for several months (maintenance therapy). You and your health care provider will work together to choose the treatment program that is best for you. Follow these instructions at home: Medicines  Take over-the-counter and prescription medicines only as told by your health care provider.  If you are taking an anticoagulant medicine: ? Take the medicine every day at the same time each day. ? Understand what foods and drugs interact with your medicine. ? Understand the side effects of this medicine, including excessive bruising or bleeding. Ask your health care provider or pharmacist about other side effects. General  instructions  Wear a medical alert bracelet or carry a medical alert card that says you have had a PE and lists what medicines you take.  Ask your health care provider when you may return to your normal activities. Avoid sitting or lying for a long time without moving.  Maintain a healthy weight. Ask your health care provider what weight is healthy for you.  Do not use any products that contain nicotine or tobacco, such as cigarettes and e-cigarettes. If you need help quitting, ask your health care provider.  Talk with your health care provider about any travel plans. It is important to make sure that you are still able to take your medicine while on trips.  Keep all follow-up visits as told by your health care provider. This is important. Contact a health care provider if:  You missed a dose of your blood thinner medicine. Get help right away if:  You have: ? New or increased pain, swelling, warmth, or redness in an arm or leg. ? Numbness or tingling in an arm or leg. ? Shortness of breath during activity or at rest. ? A fever. ? Chest pain. ? A rapid or irregular heartbeat. ? A severe headache. ? Vision changes. ? A serious fall or accident, or you hit your head. ? Stomach (abdominal) pain. ? Blood in your vomit, stool, or urine. ? A cut that will not stop bleeding.  You cough up blood.  You feel light-headed or dizzy.  You cannot move your arms or legs.  You are confused or have memory loss. These symptoms may represent a serious problem that is an emergency. Do not wait to see if the symptoms will go away. Get medical help right away. Call your local emergency services (911 in the [place] Do not drive yourself to the hospital. Summary  A pulmonary embolism (PE) is a sudden blockage or decrease of blood flow in one lung or both lungs. PE is a dangerous and life-threatening condition that needs to be treated right away.  Treatments for this condition usually include  medicines to thin your blood (anticoagulants) or medicines to break apart blood clots (thrombolytics).  If you are given blood thinners, it is important to take the medicine every single day at the same time each day.  If you have signs of PE or DVT, call your local emergency services (911 in the [place] This information is not intended to replace advice given to you by your health care provider. Make sure you discuss any questions you have with your health care provider. Document Released: 09/30/2000 Document Revised: 05/18/2018 Document Reviewed: 11/16/2017 Elsevier Interactive Patient Education  2019 [employer].

## 2018-12-11 NOTE — Progress Notes (Signed)
Subjective:     Patient ID: Jason [name] , male    DOB: 12/18/1980 , 38 y.o.   MRN: [id number]   Chief Complaint  Patient presents with  . New Patient (Initial Visit)    HPI Here for FU PE and septicemia this month. Has completed his antibiotics and is just starting the Xeralto pack 3 days ago. Has not seen a hematologist for PE cause.  States he is only taking Oxycodone 30 mg, but PDMP shows was given dilauded 2/13. The last time PDMP shows oxycodone rx is 12/05/17 and the  20 mg was 10/02/18.  Has chronic low back pain since after disc surgery and has  L cervical radiculopathy for which he states is supposed to have surgery, but has not make apt. For FU. States his PCP dismissed him due to suspecting pt for Dr Lisette Grinder when he was supposed to be seen at a pain clinic. But per records from the Letter his PCP sent him, he was not truthful about leaving his medication in an unattended vehicle.     Past Medical History:  Diagnosis Date  . Anxiety   . Back injury   . Cervical disc disease   . Chronic back pain   . Chronic low back pain 03/29/2016  . Family history of adverse reaction to anesthesia   . GERD (gastroesophageal reflux disease)   . History of kidney stones    per mother- "there was a diagnoses at some point"  . Insomnia   . Lumbar disc disease   . MIGRAINE, COMMON 09/23/2010   Qualifier: Diagnosis of  By: Jenny Reichmann MD, Hunt Oris   . Pneumonia   . Sickle cell trait (HCC)      Family History  Problem Relation Age of Onset  . Congestive Heart Failure Father   . Hypertension Mother   . Hyperlipidemia Mother   . Stroke Mother     Outpatient Encounter Medications as of 12/11/2018  Medication Sig Note  . ondansetron (ZOFRAN ODT) 4 MG disintegrating tablet Take 1 tablet (4 mg total) by mouth every 8 (eight) hours as needed for nausea or vomiting.   Marland Kitchen oxycodone (ROXICODONE) 30 MG immediate release tablet 1 tab by mouth 6 times per day 11/27/2018: LF 10/03/19 30DS  .  Rivaroxaban 15 & 20 MG TBPK Take as directed on package: Start with one 15mg  tablet by mouth twice a day with food. On Day 22, switch to one 20mg  tablet once a day with food. 12/03/2018: Last filled 11/29/88 for 30 days dose pack  . docusate sodium (COLACE) 100 MG capsule Take 1 capsule (100 mg total) by mouth daily as needed.   . gabapentin (NEURONTIN) 100 MG capsule One qhs for 3 days, then one bid x 5 days, then one tid for neuropathic pain only until you are seen by pain Dr.   . [DISCONTINUED] azithromycin (ZITHROMAX) 250 MG tablet Take one tablet daily. (Patient not taking: Reported on 12/11/2018) 12/03/2018: Filled 09/28/18 for 4 tabs  . [DISCONTINUED] azithromycin (ZITHROMAX) 250 MG tablet Take 1 tablet daily (Patient not taking: Reported on 12/03/2018)   . [DISCONTINUED] cefdinir (OMNICEF) 300 MG capsule Take 1 capsule (300 mg total) by mouth every 12 (twelve) hours. (Patient not taking: Reported on 12/11/2018)   . [DISCONTINUED] cefdinir (OMNICEF) 300 MG capsule Take 1 capsule (300 mg total) by mouth 2 (two) times daily. (Patient not taking: Reported on 12/03/2018)   . [DISCONTINUED] ferrous sulfate 325 (65 FE) MG tablet Take 1 tablet (325 mg  total) by mouth daily with breakfast. (Patient not taking: Reported on 12/11/2018)   . [DISCONTINUED] naproxen (NAPROSYN) 375 MG tablet Take 1 tablet (375 mg total) by mouth 2 (two) times daily with a meal. (Patient not taking: Reported on 11/27/2018)   . [DISCONTINUED] polyethylene glycol (MIRALAX / GLYCOLAX) packet Take 17 g by mouth daily as needed. (Patient not taking: Reported on 12/11/2018)   . [DISCONTINUED] promethazine (PHENERGAN) 25 MG tablet Take 1 tablet (25 mg total) by mouth every 6 (six) hours as needed for nausea or vomiting. (Patient not taking: Reported on 12/11/2018)   . [DISCONTINUED] senna-docusate (SENOKOT-S) 8.6-50 MG tablet Take 1 tablet by mouth 2 (two) times daily as needed for mild constipation. (Patient not taking: Reported on 12/11/2018)     No facility-administered encounter medications on file as of 12/11/2018.      Allergies  Allergen Reactions  . Other Hives and Swelling    Antibiotic that was given when he had pneumonia      Review of Systems  Constitutional: Negative for appetite change.  HENT: Negative for congestion.   Eyes: Negative for visual disturbance.  Respiratory: Positive for cough. Negative for chest tightness, shortness of breath and wheezing.        Still gets sone hemoptysis  Cardiovascular: Positive for chest pain. Negative for palpitations and leg swelling.  Gastrointestinal: Positive for constipation. Negative for abdominal pain.  Endocrine: Negative for cold intolerance, heat intolerance, polydipsia and polyphagia.  Genitourinary: Negative for difficulty urinating, dysuria, flank pain and frequency.  Musculoskeletal: Positive for arthralgias, back pain, neck pain and neck stiffness. Negative for joint swelling.       Has L  Neck radiculopathy, chronic L shoulder pain from his L neck disc disease.   Skin: Negative for rash.  Neurological: Positive for headaches. Negative for dizziness and weakness.     Today's Vitals   12/11/18 1210  BP: 130/72  Pulse: 98  Temp: 98.1 F (36.7 C)  TempSrc: Oral  SpO2: 96%  Weight: 152 lb 3.2 oz (69 kg)  Height: 5' 10.8" (1.798 m)   Body mass index is 21.35 kg/m.   Objective:  Physical Exam Vitals signs and nursing note reviewed.  Constitutional:      Appearance: He is normal weight.     Comments: Pt seems sedated, had his shirt inside out.   HENT:     Right Ear: External ear normal.     Left Ear: External ear normal.  Neck:     Musculoskeletal: Neck supple.     Comments: Has decreased ROM of head rotation to his L due to pain, and did not want to shrug his shoulders saying it hurt him.  Cardiovascular:     Rate and Rhythm: Normal rate.     Heart sounds: Normal heart sounds. No murmur.  Pulmonary:     Effort: Pulmonary effort is normal.   Neurological:     Mental Status: He is alert.  Psychiatric:        Judgment: Judgment normal.     Comments: He seems sedated     Assessment And Plan:     1. Other elevated white blood cell (WBC) count- new. Blood culture was negative.  - CBC no Diff - POCT Urinalysis Dipstick (81002) 2. 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 I started him on neurotin until he is seen by  The pain clinic.  - Ambulatory referral to [place] 12+Alcohol+CRT, Ur  4. Acute low back pain with bilateral  sciatica, unspecified back pain laterality- chronic - Ambulatory referral to Pain Clinic  5. Cervicalgia- chronic.  - Ambulatory referral to Pain Clinic  6. Acute septic pulmonary embolism with acute cor pulmonale ([place])- currently under treatment. FU 1 month - Ambulatory referral to Hematology  7. Microscopic hematuria- new, may be his blood thinner.  - Culture, Urine 8- constipation- advised to increase water and fiber. I placed him on Colace.    I read all his hospital notes, meds were reconciled, and labs were also reviewed.  9- Cervical radiculopathy- chronic. Will continue FU with his neurosurgeon.  Michiah Mudry RODRIGUEZ-SOUTHWORTH, PA-C

## 2018-12-12 ENCOUNTER — Telehealth: Payer: Self-pay | Admitting: Internal Medicine

## 2018-12-12 LAB — CMP14 + ANION GAP
ALT: 80 IU/L — ABNORMAL HIGH (ref 0–44)
ANION GAP: 17 mmol/L (ref 10.0–18.0)
AST: 32 IU/L (ref 0–40)
Albumin/Globulin Ratio: 1.5 (ref 1.2–2.2)
Albumin: 3.9 g/dL — ABNORMAL LOW (ref 4.0–5.0)
Alkaline Phosphatase: 64 IU/L (ref 39–117)
BUN/Creatinine Ratio: 6 — ABNORMAL LOW (ref 9–20)
BUN: 4 mg/dL — AB (ref 6–20)
Bilirubin Total: <0.2 mg/dL (ref 0.0–1.2)
CO2: 23 mmol/L (ref 20–29)
Calcium: 9.3 mg/dL (ref 8.7–10.2)
Chloride: 99 mmol/L (ref 96–106)
Creatinine, Ser: 0.71 mg/dL — ABNORMAL LOW (ref 0.76–1.27)
GFR calc Af Amer: 139 mL/min/{1.73_m2} (ref >59)
GFR calc non Af Amer: 120 mL/min/{1.73_m2} (ref >59)
Globulin, Total: 2.6 g/dL (ref 1.5–4.5)
Glucose: 111 mg/dL — ABNORMAL HIGH (ref 65–99)
Potassium: 3.9 mmol/L (ref 3.5–5.2)
Sodium: 139 mmol/L (ref 134–144)
Total Protein: 6.5 g/dL (ref 6.0–8.5)

## 2018-12-12 LAB — CBC
HEMATOCRIT: 34.3 % — AB (ref 37.5–51.0)
HEMOGLOBIN: 11.5 g/dL — AB (ref 13.0–17.7)
MCH: 26.4 pg — ABNORMAL LOW (ref 26.6–33.0)
MCHC: 33.5 g/dL (ref 31.5–35.7)
MCV: 79 fL (ref 79–97)
Platelets: 675 10*3/uL — ABNORMAL HIGH (ref 150–450)
RBC: 4.35 x10E6/uL (ref 4.14–5.80)
RDW: 14.3 % (ref 11.6–15.4)
WBC: 7.6 10*3/uL (ref 3.4–10.8)

## 2018-12-12 NOTE — Telephone Encounter (Signed)
Called patient to schedule hosp f/u after receiving a call from [facility] that he needed a hosp f/u he was discharged from hospital on 12/07/18. No answer left message on voicemail to give the office a call to sch a hosp f/u with Jason [name]

## 2018-12-13 ENCOUNTER — Encounter: Payer: Self-pay | Admitting: Internal Medicine

## 2018-12-13 DIAGNOSIS — M542 Cervicalgia: Secondary | ICD-10-CM | POA: Insufficient documentation

## 2018-12-13 DIAGNOSIS — M5441 Lumbago with sciatica, right side: Secondary | ICD-10-CM | POA: Insufficient documentation

## 2018-12-13 DIAGNOSIS — M5412 Radiculopathy, cervical region: Secondary | ICD-10-CM | POA: Insufficient documentation

## 2018-12-13 DIAGNOSIS — M5442 Lumbago with sciatica, left side: Secondary | ICD-10-CM

## 2018-12-13 DIAGNOSIS — G8921 Chronic pain due to trauma: Secondary | ICD-10-CM | POA: Insufficient documentation

## 2018-12-13 LAB — URINE CULTURE: Organism ID, Bacteria: NO GROWTH

## 2018-12-14 ENCOUNTER — Telehealth: Payer: Self-pay

## 2018-12-14 NOTE — Telephone Encounter (Signed)
-----   Message from Shelby Mattocks, [place] sent at 12/12/2018  3:00 PM EST ----- Please inform him that his his labs shows he is malnourished and needs to increase his protein intake and eat vegetables and fruits, and whole grains. One of his liver enzymes is elevated, so needs to avoid Tylenol and alcohol. His sugar is elevated, so needs to come back to have diabetes test done( HGBA1C). Has mild anemia, so needs to come in for visit to have a rectal exam to check for microscopic blood. His platelets are high( they help with clotting), but the hematologist will follow up on this.

## 2018-12-14 NOTE — Telephone Encounter (Signed)
Left messages for pt and mother for results to be given. A letter was mailed 12/14/18 with instructions to call back to set up appt

## 2018-12-15 LAB — DRUG SCREEN 12+ALCOHOL+CRT, UR
Amphetamines, Urine: NEGATIVE ng/mL
BENZODIAZ UR QL: POSITIVE ng/mL — AB
Barbiturate: NEGATIVE ng/mL
Cannabinoids: NEGATIVE ng/mL
Cocaine (Metabolite): POSITIVE — AB
Creatinine, Urine: 149.1 mg/dL (ref 20.0–300.0)
Ethanol, Urine: NEGATIVE %
Meperidine: NEGATIVE ng/mL
Methadone: NEGATIVE ng/mL
OPIATE SCREEN URINE: NEGATIVE ng/mL
OXYCODONE+OXYMORPHONE UR QL SCN: POSITIVE — AB
PROPOXYPHENE: NEGATIVE ng/mL
Phencyclidine: NEGATIVE ng/mL
Tramadol: NEGATIVE ng/mL

## 2018-12-18 ENCOUNTER — Ambulatory Visit: Payer: Self-pay | Admitting: Internal Medicine

## 2018-12-28 ENCOUNTER — Ambulatory Visit: Payer: Self-pay | Admitting: Internal Medicine

## 2019-01-01 ENCOUNTER — Encounter (INDEPENDENT_AMBULATORY_CARE_PROVIDER_SITE_OTHER): Payer: Self-pay

## 2019-01-01 ENCOUNTER — Other Ambulatory Visit: Payer: Self-pay

## 2019-01-01 ENCOUNTER — Ambulatory Visit: Payer: Self-pay | Admitting: Internal Medicine

## 2019-01-05 LAB — HEMOGLOBIN A1C
ESTIMATED AVERAGE GLUCOSE: 126 mg/dL
Hgb A1c MFr Bld: 6 % — ABNORMAL HIGH (ref 4.8–5.6)

## 2019-01-05 LAB — SPECIMEN STATUS REPORT

## 2019-01-08 ENCOUNTER — Ambulatory Visit: Payer: Self-pay | Admitting: Internal Medicine

## 2019-01-08 ENCOUNTER — Encounter: Payer: Self-pay | Admitting: Internal Medicine

## 2019-01-08 ENCOUNTER — Other Ambulatory Visit: Payer: Self-pay

## 2019-01-08 VITALS — BP 110/72 | HR 77 | Temp 98.3°F | Ht 70.8 in | Wt 154.0 lb

## 2019-01-08 DIAGNOSIS — I2601 Septic pulmonary embolism with acute cor pulmonale: Secondary | ICD-10-CM

## 2019-01-08 DIAGNOSIS — F1120 Opioid dependence, uncomplicated: Secondary | ICD-10-CM | POA: Insufficient documentation

## 2019-01-08 DIAGNOSIS — D473 Essential (hemorrhagic) thrombocythemia: Secondary | ICD-10-CM

## 2019-01-08 DIAGNOSIS — G8929 Other chronic pain: Secondary | ICD-10-CM

## 2019-01-08 DIAGNOSIS — Z79899 Other long term (current) drug therapy: Secondary | ICD-10-CM

## 2019-01-08 DIAGNOSIS — D75839 Thrombocytosis, unspecified: Secondary | ICD-10-CM

## 2019-01-08 DIAGNOSIS — I2782 Chronic pulmonary embolism: Secondary | ICD-10-CM

## 2019-01-08 LAB — CMP14 + ANION GAP
A/G RATIO: 2 (ref 1.2–2.2)
ALT: 14 IU/L (ref 0–44)
AST: 13 IU/L (ref 0–40)
Albumin: 4.6 g/dL (ref 4.0–5.0)
Alkaline Phosphatase: 54 IU/L (ref 39–117)
Anion Gap: 17 mmol/L (ref 10.0–18.0)
BILIRUBIN TOTAL: 0.3 mg/dL (ref 0.0–1.2)
BUN/Creatinine Ratio: 11 (ref 9–20)
BUN: 10 mg/dL (ref 6–20)
CHLORIDE: 98 mmol/L (ref 96–106)
CO2: 24 mmol/L (ref 20–29)
Calcium: 9.4 mg/dL (ref 8.7–10.2)
Creatinine, Ser: 0.94 mg/dL (ref 0.76–1.27)
GFR calc Af Amer: 119 mL/min/{1.73_m2} (ref >59)
GFR calc non Af Amer: 103 mL/min/{1.73_m2} (ref >59)
Globulin, Total: 2.3 g/dL (ref 1.5–4.5)
Glucose: 85 mg/dL (ref 65–99)
Potassium: 4.2 mmol/L (ref 3.5–5.2)
Sodium: 139 mmol/L (ref 134–144)
Total Protein: 6.9 g/dL (ref 6.0–8.5)

## 2019-01-08 MED ORDER — RIVAROXABAN 20 MG PO TABS: 20.0000 mg | ORAL_TABLET | Freq: Every day | ORAL | 2 refills | Status: DC

## 2019-01-08 MED ORDER — GABAPENTIN 100 MG PO CAPS: ORAL_CAPSULE | ORAL | 0 refills | Status: DC

## 2019-01-08 NOTE — Progress Notes (Signed)
Subjective:     Patient ID: Jason [name] , male    DOB: 1981/01/13 , 38 y.o.   MRN: [id number]   Chief Complaint  Patient presents with  . Follow-up    restoration of [facility]-    HPI Pt comes in today for follow Xeralto, he has increased his dose from 15 mg to 20 mg as instructed by the pack of xeralto tablets her had been given. Has not set up an apt with a hematologist since he is waiting on his insurance to kick in.  Has gone re-established with his prior pain Dr. And his next apt is 01/15/19. Has not been taking Oxycodone in 2 months.  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 When I asked him if he buys pills from the streets and he admitted he did the week he was here, " I bough 3 of them"   Past Medical History:  Diagnosis Date  . Anxiety   . Back injury   . Cervical disc disease   . Chronic back pain   . Chronic low back pain 03/29/2016  . Family history of adverse reaction to anesthesia   . GERD (gastroesophageal reflux disease)   . History of kidney stones    per mother- "there was a diagnoses at some point"  . Insomnia   . Lumbar disc disease   . MIGRAINE, COMMON 09/23/2010   Qualifier: Diagnosis of  By: Jenny Reichmann MD, Hunt Oris   . Pneumonia   . Sickle cell trait (HCC)      Family History  Problem Relation Age of Onset  . Congestive Heart Failure Father   . Hypertension Mother   . Hyperlipidemia Mother   . Stroke Mother      Current Outpatient Medications:  .  gabapentin (NEURONTIN) 100 MG capsule, One qhs for 3 days, then one bid x 5 days, then one tid for neuropathic pain only until you are seen by pain Dr., Disp: 90 capsule, Rfl: 0 .  oxycodone (ROXICODONE) 30 MG immediate release tablet, 1 tab by mouth 6 times per day, Disp: 102 tablet, Rfl: 0 .  Rivaroxaban 15 & 20 MG  TBPK, Take as directed on package: Start with one 15mg  tablet by mouth twice a day with food. On Day 22, switch to one 20mg  tablet once a day with food., Disp: 51 each, Rfl: 0 .  docusate sodium (COLACE) 100 MG capsule, Take 1 capsule (100 mg total) by mouth daily as needed. (Patient not taking: Reported on 01/08/2019), Disp: 1 capsule, Rfl: 0 .  ferrous sulfate 325 (65 FE) MG tablet, Take 1 tablet by mouth 2 (two) times daily., Disp: , Rfl:  .  ondansetron (ZOFRAN ODT) 4 MG disintegrating tablet, Take 1 tablet (4 mg total) by mouth every 8 (eight) hours as needed for nausea or vomiting. (Patient not taking: Reported on 01/08/2019), Disp: 10 tablet, Rfl: 0   Allergies  Allergen Reactions  . Other Hives and Swelling    Antibiotic that was given when he had pneumonia      Review of Systems  Constitutional: Negative for appetite change, chills, diaphoresis and fever.  HENT: Negative for congestion.   Respiratory: Negative for cough and shortness of breath.   Cardiovascular: Negative for leg swelling.  Gastrointestinal: Negative for blood in stool, constipation, diarrhea, nausea and vomiting.  Genitourinary: Negative for dysuria.  Musculoskeletal: Positive for neck pain and neck stiffness.       Still has chronic L chest and neck pain and will see neurosurgeon once he gets his insurance card  Skin: Negative for rash.       Has some open skin areas as he had picked on his skin on R upper arm, they are clean and no signs of infection noted.   Hematological: Negative for adenopathy.    Today's Vitals   01/08/19 1128  BP: 110/72  Pulse: 77  Temp: 98.3 F (36.8 C)  TempSrc: Oral  SpO2: 95%  Weight: 154 lb (69.9 kg)  Height: 5' 10.8" (1.798 m)   Body mass index is 21.6 kg/m.  His weight is up 2 lbs Objective:  Physical Exam Vitals signs and nursing note reviewed.  Constitutional:      Appearance: He is normal weight.     Comments: He was sitting holding his L chest and shoulder and not  moving L arm, but when I asked him to come to the exam table to be examined, he removed his jacket with no guarding or signs of pain  HENT:     Head: Normocephalic.     Right Ear: External ear normal.     Left Ear: External ear normal.  Eyes:     General: No scleral icterus.    Conjunctiva/sclera: Conjunctivae normal.  Neck:     Musculoskeletal: Neck supple.  Pulmonary:     Effort: Pulmonary effort is normal.  Lymphadenopathy:     Cervical: No cervical adenopathy.  Skin:    General: Skin is warm and dry.  Neurological:     Mental Status: He is alert and oriented to person, place, and time.     Gait: Gait normal.  Psychiatric:        Mood and Affect: Mood normal.        Behavior: Behavior normal.        Thought Content: Thought content normal.        Judgment: Judgment normal.     Assessment And Plan:    1. Thrombocytosis ([place]) chronic - Referral to Chronic Care Management Services  2. Long-term use of high-risk medication- chronic - CMP14 + Anion Gap  3. 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  4- Chronic pain- I increased his Neurotin to 200 mg tid until he sees his pain Dr    next week.   Fu with me in 3 months. I made referral to Case Manager team.     Shelby Mattocks, PA-C

## 2019-01-09 DIAGNOSIS — F1011 Alcohol abuse, in remission: Secondary | ICD-10-CM | POA: Insufficient documentation

## 2019-01-11 ENCOUNTER — Ambulatory Visit: Payer: Self-pay

## 2019-01-11 DIAGNOSIS — D75839 Thrombocytosis, unspecified: Secondary | ICD-10-CM

## 2019-01-11 DIAGNOSIS — G8921 Chronic pain due to trauma: Secondary | ICD-10-CM

## 2019-01-11 DIAGNOSIS — D473 Essential (hemorrhagic) thrombocythemia: Secondary | ICD-10-CM

## 2019-01-11 NOTE — Chronic Care Management (AMB) (Signed)
  Chronic Care Management    Clinical Social Work General Note  01/11/2019 Name: Jason [name] MRN: [id number] DOB: 22-Feb-1981  Jason [name] is a 38 y.o. year old male who is a primary care patient of [place], Sunday Spillers, [place]. The CCM was consulted to assist the patient with chronic disease management and care coordination.  [name] was given information about Chronic Care Management services today including:  1. CCM service includes personalized support from designated clinical staff supervised by his physician, including individualized plan of care and coordination with other care providers 2. 24/7 contact phone numbers for assistance for urgent and routine care needs. 3. Service will only be billed when office clinical staff spend 20 minutes or more in a month to coordinate care. 4. Only one practitioner may furnish and bill the service in a calendar month. 5. The patient may stop CCM services at any time (effective at the end of the month) by phone call to the office staff. 6. The patient will be responsible for cost sharing (co-pay) of up to 20% of the service fee (after annual deductible is met).  Patient agreed to services and verbal consent obtained.   Review of patient status, including review of consultants reports, relevant laboratory and other test results, and collaboration with appropriate care team members and the patient's provider was performed as part of comprehensive patient evaluation and provision of chronic care management services.    SDOH (Social Determinants of Health) screening performed today. See Care Plan Entry related to challenges with: Financial Strain  Tobacco Use. The patient is interested in smoking cessation materials. SW notified the patients primary care providers as well as the CCM RN CM Glenard Haring Little regarding patient interest in quitting. The patient reports he currently lives with his mother and does not have income. He has submitted an  application to Medicaid within the last 10 days. He has plans to apply for disability. SW educated the patient on application process. The patient declines SW involvement indicating his goals are related to medication assistance and health condition. SW alerted the CCM team of patients interested in pharmacy and nurse case management services. SW to sign off on patients case.    Follow Up Plan: CCM team members to follow up with the patient to address nursing and pharmacy needs.       Daneen Schick, BSW, CDP [facility] Care Management Social Worker [phone]  Total time spent performing care coordination and/or care management activities with the patient by phone or face to face = 25 minutes.

## 2019-01-11 NOTE — Patient Instructions (Signed)
Social Worker Visit Information    Materials provided: Verbal education about chronic care management program provided by phone  Mr. Vieth was given information about Chronic Care Management services today including:  1. CCM service includes personalized support from designated clinical staff supervised by his physician, including individualized plan of care and coordination with other care providers 2. 24/7 contact phone numbers for assistance for urgent and routine care needs. 3. Service will only be billed when office clinical staff spend 20 minutes or more in a month to coordinate care. 4. Only one practitioner may furnish and bill the service in a calendar month. 5. The patient may stop CCM services at any time (effective at the end of the month) by phone call to the office staff. 6. The patient will be responsible for cost sharing (co-pay) of up to 20% of the service fee (after annual deductible is met).  Patient agreed to services and verbal consent obtained.   The patient verbalized understanding of instructions provided today and declined a print copy of patient instruction materials.   Follow up plan: The patient will be outreached by a member of the CCM team within the next 7-10 days.   Daneen Schick, BSW, CDP [facility] Care Management Social Worker [phone]

## 2019-01-21 ENCOUNTER — Telehealth: Payer: Self-pay

## 2019-01-21 ENCOUNTER — Other Ambulatory Visit: Payer: Self-pay

## 2019-01-21 ENCOUNTER — Ambulatory Visit: Payer: Self-pay

## 2019-01-21 DIAGNOSIS — G8929 Other chronic pain: Secondary | ICD-10-CM

## 2019-01-21 DIAGNOSIS — M5412 Radiculopathy, cervical region: Secondary | ICD-10-CM

## 2019-01-21 DIAGNOSIS — G8921 Chronic pain due to trauma: Secondary | ICD-10-CM

## 2019-01-22 NOTE — Chronic Care Management (AMB) (Signed)
  Care Management   Initial Visit Note  01/21/2019 Name: Jason [name] MRN: [id number] DOB: 1981-07-30  Subjective: "I need to get my pain under control"   Objective: Lab Results  Component Value Date   HGBA1C 6.0 (H) 12/11/2018   Lab Results  Component Value Date   LDLCALC 79 03/23/2016   CREATININE 0.94 01/08/2019   BP Readings from Last 3 Encounters:  01/08/19 110/72  12/11/18 130/72  12/03/18 122/87    Assessment: Jason [name] is a 39 y.o. year old male who sees Rodriguez-Southworth, Sunday Spillers, [place] for primary care. Sunday Spillers asked the CCM team to consult the patient for assistance with chronic disease management related to uncontrolled chronic pain and care coordination needs.    Review of patient status, including review of consultants reports, relevant laboratory and other test results, and collaboration with appropriate care team members and the patient's provider was performed as part of comprehensive patient evaluation and provision of chronic care management services.    I spoke with Jason [name] by telephone to assess for CM needs.   Goals Addressed      Patient Stated   . 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 Film/video editor.  . Transportation barriers . Difficulty obtaining medications  Nurse Case Manager Clinical Goal(s):  Marland Kitchen Over the next 30 days, patient will verbalize understanding of plan for ongoing pain managment treatment plan.  . Over the next 30 days, patient will have received resources pertaining to free legal services to assist with applying for SSDI, and resources for [employer] financial assistance program.   Interventions:   Telephone CCM RN initial outreach completed with patient . Evaluation of current treatment plan related to pain management and patient's adherence to plan as established by provider .  Reviewed medications with patient and discussed past MD visits, tests, and other treatments related to his spinal impairment and chronic pain management . Assessed for progress with Medicaid application process . Collaborated with [place] regarding resources needed for free legal aid to assist with SSDI and other financial assistance that may be available through the [place] . Placed outbound call to [facility] [phone], left a voice message requesting a return call to address questions about their referral process . Discussed plans with patient for ongoing care management follow up and provided patient with direct contact information for care management team . Scheduled a CCM f/u call with patient  Patient Self Care Activities:  . Currently UNABLE TO independently ambulate short distances, or perform physical manual labor . Self administers medications as prescribed . Attends all scheduled provider appointments . Calls pharmacy for medication refills . Performs ADL's independently with difficulty  . Performs IADL's independently with difficulty . Calls provider office for new concerns or questions  Initial goal documentation       Follow up Plan:  CCM RN will follow up with patient again in 5-7 days.    Barb Merino, RN,CCM Care Management Coordinator [place] [facility]  Direct Phone: [phone]

## 2019-01-22 NOTE — Patient Instructions (Signed)
Visit Information  Goals Addressed      Patient Stated   . 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 Film/video editor.  . Transportation barriers . Difficulty obtaining medications  Nurse Case Manager Clinical Goal(s):  Marland Kitchen Over the next 30 days, patient will verbalize understanding of plan for ongoing pain managment treatment plan.  . Over the next 30 days, patient will have received resources pertaining to free legal services to assist with applying for SSDI, and resources for [employer] financial assistance program.   Interventions:   Telephone CCM RN initial outreach completed with patient . Evaluation of current treatment plan related to pain management and patient's adherence to plan as established by provider . Reviewed medications with patient and discussed past MD visits, tests, and other treatments related to his spinal impairment and chronic pain management . Assessed for progress with Medicaid application process . Collaborated with [place] regarding resources needed for free legal aid to assist with SSDI and other financial assistance that may be available through the [place] . Placed outbound call to [facility] [phone], left a voice message requesting a return call to address questions about their referral process . Discussed plans with patient for ongoing care management follow up and provided patient with direct contact information for care management team . Scheduled a CCM f/u call with patient  Patient Self Care Activities:  . Currently UNABLE TO independently ambulate short distances, or perform physical manual labor . Self administers medications as prescribed . Attends all scheduled provider appointments . Calls pharmacy for medication refills . Performs ADL's independently with difficulty   . Performs IADL's independently with difficulty . Calls provider office for new concerns or questions  Initial goal documentation       The patient verbalized understanding of instructions provided today and declined a print copy of patient instruction materials.   The CM team will reach out to the patient again over the next 5-7 days.   Barb Merino, RN,CCM Care Management Coordinator [place] [facility]  Direct Phone: [phone]

## 2019-01-24 ENCOUNTER — Telehealth: Payer: Self-pay

## 2019-01-24 ENCOUNTER — Ambulatory Visit: Payer: Self-pay

## 2019-01-24 DIAGNOSIS — M5412 Radiculopathy, cervical region: Secondary | ICD-10-CM

## 2019-01-24 DIAGNOSIS — G8921 Chronic pain due to trauma: Secondary | ICD-10-CM

## 2019-01-24 DIAGNOSIS — G8929 Other chronic pain: Secondary | ICD-10-CM

## 2019-01-24 NOTE — Chronic Care Management (AMB) (Signed)
  Care Management   Note  01/24/2019 Name: Jason [name] MRN: [id number] DOB: 07-11-1981    Chronic Care Management   Outreach Note  01/24/2019 Name: Jason [name] MRN: [id number] DOB: 1981-02-24  Referred by: Shelby Mattocks, PA-C Reason for referral : Care Coordination (CM Telephone outreach to provider)   An unsuccessful telephone outreach was attempted today. The patient was referred to the case management team by Audery Amel PA-C for assistance with uncontrolled chronic pain and care coordination.   Follow Up Plan: The CM team will reach out to the patient again over the next 5-7 days.    Barb Merino, RN,CCM Care Management Coordinator [place] [facility]  Direct Phone: [phone]

## 2019-01-25 ENCOUNTER — Telehealth: Payer: Self-pay

## 2019-01-28 ENCOUNTER — Telehealth: Payer: Self-pay

## 2019-01-28 ENCOUNTER — Encounter (INDEPENDENT_AMBULATORY_CARE_PROVIDER_SITE_OTHER): Payer: Self-pay | Admitting: Specialist

## 2019-01-28 NOTE — Progress Notes (Signed)
Patient left without being seen.

## 2019-01-28 NOTE — Patient Instructions (Signed)
Patient left without being seen.

## 2019-01-31 ENCOUNTER — Telehealth: Payer: Self-pay

## 2019-02-01 ENCOUNTER — Ambulatory Visit (HOSPITAL_COMMUNITY)
Admission: EM | Admit: 2019-02-01 | Discharge: 2019-02-01 | Disposition: A | Discharge status: Home / Self Care | Payer: Self-pay | Attending: Family Medicine | Admitting: Family Medicine

## 2019-02-01 ENCOUNTER — Encounter (HOSPITAL_COMMUNITY): Payer: Self-pay

## 2019-02-01 DIAGNOSIS — L089 Local infection of the skin and subcutaneous tissue, unspecified: Secondary | ICD-10-CM

## 2019-02-01 DIAGNOSIS — T148XXA Other injury of unspecified body region, initial encounter: Secondary | ICD-10-CM

## 2019-02-01 DIAGNOSIS — L03221 Cellulitis of neck: Secondary | ICD-10-CM

## 2019-02-01 MED ORDER — SULFAMETHOXAZOLE-TRIMETHOPRIM 800-160 MG PO TABS: 1.0000 | ORAL_TABLET | Freq: Two times a day (BID) | ORAL | 0 refills | Status: AC

## 2019-02-01 NOTE — ED Provider Notes (Signed)
[facility]    CSN: [id number] Arrival date & time: 02/01/19  1402     History   Chief Complaint Chief Complaint  Patient presents with  . Wound Check    HPI Jason [name] is a 38 y.o. male history of polysubstance abuse, previous PE on Xarelto, presenting today for evaluation of multiple sores.  Patient states that over the past 2 weeks he has had sores to his right arm, upper back as well as forehead.  He is worried that they are getting infectious as they have become painful.  He also notes that they are very itchy and he frequently picks at home.  He admits to pulling the scabs off of these areas. Notes that they have been draining mainly a clear fluid.  Has had some neck discomfort, but denies difficulty moving neck, denies difficulty moving upper extremities.  He does admit to oxycodone prior to arrival.  Denies recent or previous IV drug use.  Denies weakness, dizziness or lightheadedness.  Denies fevers. HPI  Past Medical History:  Diagnosis Date  . Anxiety   . Back injury   . Cervical disc disease   . Chronic back pain   . Chronic low back pain 03/29/2016  . Family history of adverse reaction to anesthesia   . GERD (gastroesophageal reflux disease)   . History of kidney stones    per mother- "there was a diagnoses at some point"  . Insomnia   . Lumbar disc disease   . MIGRAINE, COMMON 09/23/2010   Qualifier: Diagnosis of  By: Jenny Reichmann MD, Hunt Oris   . Pneumonia   . Sickle cell trait [facility])     Patient Active Problem List   Diagnosis Date Noted  . 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 Uncomplicated opioid dependence ([place]) 01/08/2019  . Cervicalgia 12/13/2018  . Acute low back pain with bilateral sciatica 12/13/2018  . Chronic pain due to trauma 12/13/2018  . Cervical radiculopathy 12/13/2018  . Cocaine abuse, episodic ([place]) 12/11/2018  . Thrombocytosis ([place]) 12/05/2018  . Lactic acidosis 12/04/2018  . Pulmonary embolism ([place]) 11/27/2018  . S/P  lumbar laminectomy 08/29/2017  . Spinal stenosis of lumbar region 08/28/2017    Class: Chronic  . Status post lumbar microdiscectomy 08/28/2017  . 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 Gross hematuria 04/01/2016  . Depression 04/01/2016  . Chronic low back pain 03/29/2016  . Cough 08/05/2015  . Encounter for well adult exam with abnormal findings 02/17/2015  . Lumbar disc disease   . Cervical disc disease   . GERD (gastroesophageal reflux disease)   . Anxiety   . Insomnia   . CONSTIPATION 10/05/2010  . MIGRAINE, COMMON 09/23/2010  . Abdominal pain, other specified site 09/23/2010    Past Surgical History:  Procedure Laterality Date  . BACK SURGERY    . LUMBAR LAMINECTOMY/DECOMPRESSION MICRODISCECTOMY Bilateral 08/28/2017   Procedure: BILATERAL PARTIAL HEMILAMINECTOMIES L4-5 WITH MICRODISCECTOMY;  Surgeon: Jessy Oto, MD;  Location: [place];  Service: Orthopedics;  Laterality: Bilateral;  . NO PAST SURGERIES         Home Medications    Prior to Admission medications   Medication Sig Start Date End Date Taking? Authorizing Provider  ferrous sulfate 325 (65 FE) MG tablet Take 1 tablet by mouth 2 (two) times daily. 12/07/18   [provider]  gabapentin (NEURONTIN) 100 MG capsule 2 tid until you see the pain Dr.Then they will  need to take over. 01/08/19   Rodriguez-Southworth, Sunday Spillers, PA-C  rivaroxaban (XARELTO) 20 MG TABS tablet Take 1 tablet (20 mg total) by mouth daily with supper. 01/08/19   Rodriguez-Southworth, Sunday Spillers, PA-C  Rivaroxaban 15 & 20 MG TBPK Take as directed on package: Start with one 15mg  tablet by mouth twice a day with food. On Day 22, switch to one 20mg  tablet once a day with food. Patient not taking: Reported on 01/21/2019 11/29/18   Dorrell, Andree Elk, MD  sulfamethoxazole-trimethoprim (BACTRIM DS) 800-160 MG tablet Take 1 tablet by mouth 2 (two) times daily for 7 days. 02/01/19  02/08/19  , Elesa Hacker, PA-C    Family History Family History  Problem Relation Age of Onset  . Congestive Heart Failure Father   . Hypertension Mother   . Hyperlipidemia Mother   . Stroke Mother     Social History Social History   Tobacco Use  . Smoking status: Current Every Day Smoker    Packs/day: 1.00    Years: 24.00    Pack years: 24.00    Types: Cigarettes  . Smokeless tobacco: Never Used  . Tobacco comment: notified primary care provider and CCM RNCM  Substance Use Topics  . Alcohol use: Yes    Frequency: Never    Comment: former use - heavy drinker 8 years ago  . Drug use: No     Allergies   Other   Review of Systems Review of Systems  Constitutional: Negative for fatigue and fever.  Eyes: Negative for redness, itching and visual disturbance.  Respiratory: Negative for shortness of breath.   Cardiovascular: Negative for chest pain and leg swelling.  Gastrointestinal: Negative for nausea and vomiting.  Musculoskeletal: Positive for neck pain. Negative for arthralgias, myalgias and neck stiffness.  Skin: Positive for color change and wound. Negative for rash.  Neurological: Negative for dizziness, syncope, weakness, light-headedness and headaches.     Physical Exam Triage Vital Signs ED Triage Vitals [02/01/19 1419]  Enc Vitals Group     BP 116/82     Pulse Rate (!) 106     Resp 16     Temp 99.2 F (37.3 C)     Temp Source Oral     SpO2 99 %     Weight      Height      Head Circumference      Peak Flow      Pain Score 7     Pain Loc      Pain Edu?      Excl. in [place]?    No data found.  Updated Vital Signs BP 116/82 (BP Location: Left Arm)   Pulse (!) 106   Temp 99.2 F (37.3 C) (Oral)   Resp 16   SpO2 99%  Heart rate rechecked and was 96 Visual Acuity Right Eye Distance:   Left Eye Distance:   Bilateral Distance:    Right Eye Near:   Left Eye Near:    Bilateral Near:     Physical Exam Vitals signs and nursing note  reviewed.  Constitutional:      Appearance: He is well-developed.     Comments: Patient appears drowsy, frequently closing eyes  HENT:     Head: Normocephalic and atraumatic.     Nose: Nose normal.  Eyes:     Conjunctiva/sclera: Conjunctivae normal.  Neck:     Musculoskeletal: Neck supple.  Cardiovascular:     Rate and Rhythm: Normal rate.  Pulmonary:  Effort: Pulmonary effort is normal. No respiratory distress.  Abdominal:     General: There is no distension.  Musculoskeletal: Normal range of motion.     Comments: Full active range of neck and upper extremities  Skin:    General: Skin is warm and dry.     Comments: See pictures below Right upper arm with approximately 1 to 2 cm lesions, 2 upper lesions with drainage of clearish yellowish fluid; minimal surrounding erythema  Upper right thoracic back with 5 to 6 cm abrasion with surrounding erythema and warmth  Mid forehead with multiple 0.5 to 1 cm abrasions, minimal surrounding erythema  Patient frequently picking at these areas throughout visit  Neurological:     Mental Status: He is oriented to person, place, and time.            UC Treatments / Results  Labs (all labs ordered are listed, but only abnormal results are displayed) Labs Reviewed - No data to display  EKG None  Radiology No results found.  Procedures Procedures (including critical care time)  Medications Ordered in UC Medications - No data to display  Initial Impression / Assessment and Plan / UC Course  I have reviewed the triage vital signs and the nursing notes.  Pertinent labs & imaging results that were available during my care of the patient were reviewed by me and considered in my medical decision making (see chart for details).  Clinical Course as of Feb 01 1447  Fri Feb 01, 2019  1437 HR 96, O2 96   [HW]    Clinical Course User Index [HW] ,  C, PA-C    Lesion on back concerning for cellulitis/developing  abscess.  Other lesions likely more from picking and do not currently look actively infected.  Full active range of motion of neck and extremities, no fever, blood pressure stable and tachycardia improved during visit.  Less concerning for sepsis at this time, drowsiness likely related to oxycodone use prior to her arrival.  Will initiate on Bactrim to treat for cellulitis/abscess and cover for MRSA.  Denies IV drug use.  Patient will be discharged with his mother, discussed not feeling safe with him driving himself home given his alertness.  Advised to follow-up in emergency room if developing any increased neck stiffness, pain, fever, lightheadedness, dizziness or or weakness.Discussed strict return precautions. Patient verbalized understanding and is agreeable with plan.  Final Clinical Impressions(s) / UC Diagnoses   Final diagnoses:  Wound infection  Cellulitis of neck     Discharge Instructions     Please begin taking Bactrim twice daily for the next week Do not pick at wounds  Follow up in emergency room if developing fevers, weakness, dizziness   ED Prescriptions    Medication Sig Dispense Auth. Provider   sulfamethoxazole-trimethoprim (BACTRIM DS) 800-160 MG tablet Take 1 tablet by mouth 2 (two) times daily for 7 days. 14 tablet , [place] C, PA-C     Controlled Substance Prescriptions [place] Controlled Substance Registry consulted? Not Applicable   Janith Lima, [place] 02/01/19 1505

## 2019-02-01 NOTE — ED Triage Notes (Signed)
Pt states took oxycodone before he came

## 2019-02-01 NOTE — ED Triage Notes (Addendum)
Pt c/o open sores to upper back, forehead and rt upper arm x2wks. Pt falling asleep sitting up during triage picking at his face

## 2019-02-01 NOTE — Discharge Instructions (Signed)
Please begin taking Bactrim twice daily for the next week Do not pick at wounds  Follow up in emergency room if developing fevers, weakness, dizziness

## 2019-02-06 ENCOUNTER — Telehealth: Payer: Self-pay | Admitting: Internal Medicine

## 2019-02-06 NOTE — Telephone Encounter (Signed)
I called pt to check on his since he was at the urgent care on 4/17 for skin infection and he states he is doing better, the skin lesions are healing and has not had a fever in 4 days. He is tolerating his antibiotic well. He did not have any further questions.

## 2019-02-11 ENCOUNTER — Telehealth: Payer: Self-pay

## 2019-02-12 ENCOUNTER — Ambulatory Visit: Payer: Self-pay

## 2019-02-12 ENCOUNTER — Other Ambulatory Visit: Payer: Self-pay

## 2019-02-12 ENCOUNTER — Telehealth: Payer: Self-pay

## 2019-02-12 DIAGNOSIS — G8921 Chronic pain due to trauma: Secondary | ICD-10-CM

## 2019-02-12 DIAGNOSIS — M5412 Radiculopathy, cervical region: Secondary | ICD-10-CM

## 2019-02-12 NOTE — Chronic Care Management (AMB) (Signed)
Care Management   Follow Up Note   02/12/2019 Name: Jason [name] MRN: [id number] DOB: 1981/02/17  Referred by: Jason Mattocks, PA-C Reason for referral : Care Coordination (CM Telephone Follow Up )   Jason [name] is a 38 y.o. year old male who is a primary care patient of Jason [name], Jason [name], Jason [name]. The CCM team was consulted for assistance with chronic disease management and care coordination needs.    Review of patient status, including review of consultants reports, relevant laboratory and other test results, and collaboration with appropriate care team members and the patient's provider was performed as part of comprehensive patient evaluation and provision of chronic care management services.    I spoke to [name] by telephone today to f/u on his plan for pain management and recent ED visit.   Goals Addressed      Patient Stated    "I need to get my pain under control"  (pt-stated)       Current Barriers:   Knowledge Deficits related to treatment options related to spinal impairment  Financial Constraints.   Transportation barriers  Difficulty obtaining medications  Nurse Case Manager Clinical Goal(s):   Over the next 30 days, patient will verbalize understanding of plan for ongoing pain managment treatment plan.   Over the next 30 days, patient will have received resources pertaining to free legal services to assist with applying for SSDI, and resources for [employer] financial assistance program.   Interventions:   Completed CM Telephone outreach with patient  Evaluation of current treatment plan related to pain management and patient's adherence to plan as established by provider  Discussed patient's recent ED visit concerning multiple open wounds noted to his forehead, back and arms  Assessed for status of wounds and adherence to applying Bactrim as directed (pt reports the wounds are healing as evidence by, pt states  the wounds are getting smaller, wounds are without drainage, he reports adherence with following the recommendations)  Assessed for known etiology for open wounds (pt reports having a fall about a month ago, states wounds did not heal from fall)  Assessed for progress with Medicaid application process (pt reports the application was hand delivered to Jason [name] office, he has not received a response)  Assessed for current pain management status and treatment management (pt continues to take 1 30 mg tablet of Oxycodone daily from his mother's supply, he will follow up with Jason [name] tomorrow, (he confirms having transportation)  Provided patient with the phone number for Jason [name] and Jason [name] for free counseling to assist with SSDI application (pt recorded the phone number's during our call)  Discussed plans with patient for ongoing care management follow up and provided patient with direct contact information for care management team  Confirmed patient has the contact # for RN CM and discussed nurse hours of availability  Scheduled a CM Telephone f/u call for about 1 week  Patient Self Care Activities:   Currently UNABLE TO independently ambulate short distances, or perform physical manual labor  Self administers medications as prescribed  Attends all scheduled provider appointments  Calls pharmacy for medication refills  Performs ADL's independently with difficulty   Performs IADL's independently with difficulty  Calls provider office for Jason concerns or questions  Please see past updates related to this goal by clicking on the "Past Updates" button in the selected goal         The CM team will reach out to the  patient again over the next 7-10 days.    Jason Merino, RN,CCM Care Management Coordinator Jason [name]  Direct Phone: [phone]

## 2019-02-12 NOTE — Patient Instructions (Signed)
Visit Information  Goals Addressed      Patient Stated   . 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 Film/video editor.  . Transportation barriers . Difficulty obtaining medications  Nurse Case Manager Clinical Goal(s):  Marland Kitchen Over the next 30 days, patient will verbalize understanding of plan for ongoing pain managment treatment plan.  . Over the next 30 days, patient will have received resources pertaining to free legal services to assist with applying for SSDI, and resources for [employer] financial assistance program.   Interventions:   Completed CM Telephone outreach with patient . Evaluation of current treatment plan related to pain management and patient's adherence to plan as established by provider . Discussed patient's recent ED visit concerning multiple open wounds noted to his forehead, back and arms . Assessed for status of wounds and adherence to applying Bactrim as directed (pt reports the wounds are healing as evidence by, pt states the wounds are getting smaller, wounds are without drainage, he reports adherence with following the recommendations) . Assessed for known etiology for open wounds (pt reports having a fall about a month ago, states wounds did not heal from fall) . Assessed for progress with Medicaid application process (pt reports the application was hand delivered to [facility] office, he has not received a response) . Assessed for current pain management status and treatment management (pt continues to take 1 30 mg tablet of Oxycodone daily from his mother's supply, he will follow up with [facility] tomorrow, (he confirms having transportation) . Provided patient with the phone number for [facility] and [place] for free counseling to assist with SSDI application (pt recorded the phone number's during  our call) . Discussed plans with patient for ongoing care management follow up and provided patient with direct contact information for care management team . Confirmed patient has the contact # for RN CM and discussed nurse hours of availability . Scheduled a CM Telephone f/u call for about 1 week  Patient Self Care Activities:  . Currently UNABLE TO independently ambulate short distances, or perform physical manual labor . Self administers medications as prescribed . Attends all scheduled provider appointments . Calls pharmacy for medication refills . Performs ADL's independently with difficulty  . Performs IADL's independently with difficulty . Calls provider office for new concerns or questions  Please see past updates related to this goal by clicking on the "Past Updates" button in the selected goal        The patient verbalized understanding of instructions provided today and declined a print copy of patient instruction materials.   The CM team will reach out to the patient again over the next 7-10 days.   Barb Merino, RN,CCM Care Management Coordinator [place] [facility]  Direct Phone: [phone]

## 2019-02-20 ENCOUNTER — Telehealth: Payer: Self-pay

## 2019-02-28 ENCOUNTER — Telehealth: Payer: Self-pay

## 2019-03-13 ENCOUNTER — Telehealth: Payer: Self-pay

## 2019-04-08 ENCOUNTER — Ambulatory Visit: Payer: Self-pay

## 2019-04-08 ENCOUNTER — Telehealth: Payer: Self-pay

## 2019-04-08 ENCOUNTER — Other Ambulatory Visit: Payer: Self-pay

## 2019-04-08 DIAGNOSIS — M5412 Radiculopathy, cervical region: Secondary | ICD-10-CM

## 2019-04-08 DIAGNOSIS — I2782 Chronic pulmonary embolism: Secondary | ICD-10-CM

## 2019-04-08 DIAGNOSIS — G8921 Chronic pain due to trauma: Secondary | ICD-10-CM

## 2019-04-08 DIAGNOSIS — I2601 Septic pulmonary embolism with acute cor pulmonale: Secondary | ICD-10-CM

## 2019-04-10 ENCOUNTER — Ambulatory Visit: Payer: Self-pay | Admitting: Internal Medicine

## 2019-04-11 ENCOUNTER — Other Ambulatory Visit: Payer: Self-pay

## 2019-04-11 ENCOUNTER — Ambulatory Visit: Payer: Self-pay | Admitting: Nurse Practitioner

## 2019-04-11 ENCOUNTER — Encounter: Payer: Self-pay | Admitting: Nurse Practitioner

## 2019-04-11 ENCOUNTER — Ambulatory Visit: Payer: Self-pay | Admitting: Internal Medicine

## 2019-04-11 VITALS — BP 110/74 | HR 99 | Temp 98.8°F | Ht 67.4 in | Wt 150.0 lb

## 2019-04-11 DIAGNOSIS — G8921 Chronic pain due to trauma: Secondary | ICD-10-CM

## 2019-04-11 DIAGNOSIS — I2782 Chronic pulmonary embolism: Secondary | ICD-10-CM

## 2019-04-11 DIAGNOSIS — I2601 Septic pulmonary embolism with acute cor pulmonale: Secondary | ICD-10-CM

## 2019-04-11 DIAGNOSIS — M545 Low back pain: Secondary | ICD-10-CM

## 2019-04-11 MED ORDER — RIVAROXABAN 20 MG PO TABS: 20.0000 mg | ORAL_TABLET | Freq: Every day | ORAL | 2 refills | Status: DC

## 2019-04-11 NOTE — Progress Notes (Signed)
Subjective:     Patient ID: Jason [name] , male    DOB: Feb 19, 1981 , 38 y.o.   MRN: [id number]   Chief Complaint  Patient presents with  . Back Pain    patient would like a referral to pain management  . Leg Pain    HPI  Needs referral to [facility].  Went to [facility] pain management.  Pain is related to an injury.  Initially was at [facility] then wen to [facility] and lost insurance then went to [place].    Back Pain This is a chronic problem. The current episode started more than 1 year ago (10 years ago). The quality of the pain is described as shooting. The pain radiates to the right thigh, right foot and right knee. The pain is at a severity of 6/10. The pain is the same all the time. Pertinent negatives include no headaches. Risk factors include sedentary lifestyle.     Past Medical History:  Diagnosis Date  . Anxiety   . Back injury   . Cervical disc disease   . Chronic back pain   . Chronic low back pain 03/29/2016  . Family history of adverse reaction to anesthesia   . GERD (gastroesophageal reflux disease)   . History of kidney stones    per mother- "there was a diagnoses at some point"  . Insomnia   . Lumbar disc disease   . MIGRAINE, COMMON 09/23/2010   Qualifier: Diagnosis of  By: Jenny Reichmann MD, Hunt Oris   . Pneumonia   . Sickle cell trait (HCC)      Family History  Problem Relation Age of Onset  . Congestive Heart Failure Father   . Hypertension Mother   . Hyperlipidemia Mother   . Stroke Mother      Current Outpatient Medications:  .  oxyCODONE (OXYCONTIN) 15 mg 12 hr tablet, Take 15 mg by mouth every 12 (twelve) hours., Disp: , Rfl:  .  oxycodone (ROXICODONE) 30 MG immediate release tablet, Take 30 mg by mouth 3 (three) times daily., Disp: , Rfl:  .  rivaroxaban (XARELTO) 20 MG TABS tablet, Take 1 tablet (20 mg total) by mouth daily with supper., Disp: 30 tablet, Rfl: 2   Allergies  Allergen Reactions  . Morphine And  Related Swelling  . Other Hives and Swelling    Antibiotic that was given when he had pneumonia      Review of Systems  Respiratory: Negative.   Cardiovascular: Negative.   Musculoskeletal: Positive for back pain.  Neurological: Negative for dizziness and headaches.     Today's Vitals   04/11/19 1549  BP: 110/74  Pulse: 99  Temp: 98.8 F (37.1 C)  TempSrc: Oral  Weight: 150 lb (68 kg)  Height: 5' 7.4" (1.712 m)  PainSc: 6   PainLoc: Back   Body mass index is 23.22 kg/m.   Objective:  Physical Exam Constitutional:      Appearance: Normal appearance.  Cardiovascular:     Rate and Rhythm: Normal rate and regular rhythm.     Pulses: Normal pulses.     Heart sounds: Normal heart sounds. No murmur.  Musculoskeletal:        General: Tenderness (low back tenderness) present.  Skin:    General: Skin is warm and dry.     Capillary Refill: Capillary refill takes less than 2 seconds.  Neurological:     General: No focal deficit present.     Mental Status: He is alert  and oriented to person, place, and time.  Psychiatric:        Mood and Affect: Mood normal.        Behavior: Behavior normal.        Thought Content: Thought content normal.        Judgment: Judgment normal.         Assessment And Plan:     1. Chronic pain due to trauma  He would like a referral to [facility]   He has been to [place] recently but did not want to remain there   2. 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  Continue with xarelto as directed - rivaroxaban (XARELTO) 20 MG TABS tablet; Take 1 tablet (20 mg total) by mouth daily with supper.  Dispense: 30 tablet; Refill: 2   Minette Brine, FNP    THE PATIENT IS ENCOURAGED TO PRACTICE SOCIAL DISTANCING DUE TO THE COVID-19 PANDEMIC.

## 2019-04-11 NOTE — Patient Instructions (Signed)
Visit Information  Goals Addressed      Patient Stated   . "I am trying to get approved for Medicaid" (pt-stated)       Current Barriers:  . Film/video editor.  . Difficulty obtaining medications . Chronic Disease Management support and education needs related to community resources, pain management and SSDI/Medicaid resources and assistance  Nurse Case Manager Clinical Goal(s):  Marland Kitchen Over the next 60 days, patient will verbalize having received a final response from Medicaid with his approval/denial.   CCM RN CM Interventions:  04/08/19 completed call with patient  . Assessed for progress with Medicaid application process - pt reports he received a call from the [facility] office asking for additional information in which he provided over the phone - pt states he was told he would receive notification of denial/approval in the mail  . Discussed plans with patient for ongoing care management follow up and provided patient with direct contact information for care management team  Patient Self Care Activities:  . Self administers medications as prescribed . Calls pharmacy for medication refills . Performs ADL's independently . Performs IADL's independently . Calls provider office for new concerns or questions  Initial goal documentation     . "I have some new open wounds on my arms" (pt-stated)       Current Barriers:  . Impaired Skin Integrity  . Risk for Infection   Nurse Case Manager Clinical Goal(s):  Marland Kitchen Over the next 30 days, patient will work with provider to address needs related to treatment management for impaired skin integrity and open wounds to bilateral forearms.   CCM RN CM Interventions:  04/08/19 completed call with patient  . Evaluation of current treatment plan related to open wounds on bilateral forearms and patient's adherence to plan as established by provider. . Provided education to patient re: importance of keeping skin clean, dry and covered to help  prevent infection. Nash Dimmer with provider Minette Brine, FNP  regarding patient reported new open wounds to his forearms bilaterally . Discussed plans with patient for ongoing care management follow up and provided patient with direct contact information for care management team . Discussed patient has a f/u with provider Minette Brine, [place] on 04/11/19; patient declines to have appointment moved up to an earlier time  Patient Self Care Activities:  . Self administers medications as prescribed . Calls pharmacy for medication refills . Performs ADL's independently . Performs IADL's independently . Calls provider office for new concerns or questions  Initial goal documentation     . 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 Film/video editor.  . Transportation barriers . Difficulty obtaining medications  Nurse Case Manager Clinical Goal(s):  Marland Kitchen Over the next 30 days, patient will verbalize understanding of plan for ongoing pain managment  treatment plan. Goal Not Met . Over the next 30 days, patient will have received resources pertaining to free legal services to assist with applying for SSDI, and resources for [employer] financial assistance program. Goal Met  . 04/08/19 Over the next 60 days, patient will be re-established with a Pain Clinic and verbalize having effective pain management.   CCM RN CM Interventions:  04/08/19 completed call with patient   . Evaluation of current treatment plan related to pain management and patient's adherence to plan as established by provider - patient has been receiving  Pain management from [facility] - he was prescribed Morphine verses Oxycodone and reports he experienced bothersome SE . Discussed patient stopped by PCP office today to request a referral to [facility] - he previously received services  from this clinic and states he received effective pain management  . Discussed patient has a f/u appointment with Minette Brine, FNP scheduled for 04/11/19 @ 3:30 PM - patient states he plans to keep the appointment  . Discussed plans with patient for ongoing care management follow up and provided patient with direct contact information for care management team . Sent in-basket message to provider Minette Brine, FNP with an update on patient's request for a Pain referral to Dr. Lennox Grumbles  Patient Self Care Activities:  . Currently UNABLE TO independently ambulate short distances, or perform physical manual labor . Self administers medications as prescribed . Attends all scheduled provider appointments . Calls pharmacy for medication refills . Performs ADL's independently with difficulty  . Performs IADL's independently with difficulty . Calls provider office for new concerns or questions  Please see past updates related to this goal by clicking on the "Past Updates" button in the selected goal        The patient verbalized understanding of instructions provided today and declined a print copy of patient instruction materials.   Telephone follow up appointment with care management team member scheduled for: 04/29/19  Barb Merino, RN, BSN, CCM Care Management Coordinator [place] [facility]  Direct Phone: [phone]

## 2019-04-11 NOTE — Chronic Care Management (AMB) (Signed)
Care Management   Follow Up Note   04/08/2019 Name: Jason [name] MRN: [id number] DOB: 1981-08-17  Referred by: Shelby Mattocks, PA-C Reason for referral : Care Coordination (CM RNCM Telephone Follow Up )   Grove Defina is a 38 y.o. year old male who is a primary care patient of [place], Sunday Spillers, [place]. The CCM team was consulted for assistance with chronic disease management and care coordination needs.    Review of patient status, including review of consultants reports, relevant laboratory and other test results, and collaboration with appropriate care team members and the patient's provider was performed as part of comprehensive patient evaluation and provision of chronic care management services.    I spoke with Mr. Doyle by telephone today for a CCM follow up.   Goals Addressed      Patient Stated   . "I am trying to get approved for Medicaid" (pt-stated)       Current Barriers:  . Film/video editor.  . Difficulty obtaining medications . Chronic Disease Management support and education needs related to community resources, pain management and SSDI/Medicaid resources and assistance  Nurse Case Manager Clinical Goal(s):  Marland Kitchen Over the next 60 days, patient will verbalize having received a final response from Medicaid with his approval/denial.   CCM RN CM Interventions:  04/08/19 completed call with patient  . Assessed for progress with Medicaid application process - pt reports he received a call from the [facility] office asking for additional information in which he provided over the phone - pt states he was told he would receive notification of denial/approval in the mail  . Discussed plans with patient for ongoing care management follow up and provided patient with direct contact information for care management team  Patient Self Care Activities:  . Self administers medications as prescribed . Calls pharmacy for medication refills .  Performs ADL's independently . Performs IADL's independently . Calls provider office for new concerns or questions  Initial goal documentation     . "I have some new open wounds on my arms" (pt-stated)       Current Barriers:  . Impaired Skin Integrity  . Risk for Infection   Nurse Case Manager Clinical Goal(s):  Marland Kitchen Over the next 30 days, patient will work with provider to address needs related to treatment management for impaired skin integrity and open wounds to bilateral forearms.   CCM RN CM Interventions:  04/08/19 completed call with patient  . Evaluation of current treatment plan related to open wounds on bilateral forearms and patient's adherence to plan as established by provider. . Provided education to patient re: importance of keeping skin clean, dry and covered to help prevent infection. Nash Dimmer with provider Minette Brine, FNP  regarding patient reported new open wounds to his forearms bilaterally . Discussed plans with patient for ongoing care management follow up and provided patient with direct contact information for care management team . Discussed patient has a f/u with provider Minette Brine, [place] on 04/11/19; patient declines to have appointment moved up to an earlier time  Patient Self Care Activities:  . Self administers medications as prescribed . Calls pharmacy for medication refills . Performs ADL's independently . Performs IADL's independently . Calls provider office for new concerns or questions  Initial goal documentation     . 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 Film/video editor.  Marland Kitchen  Transportation barriers . Difficulty obtaining medications  Nurse Case Manager Clinical Goal(s):  Marland Kitchen Over the next 30 days, patient will verbalize understanding of plan for ongoing pain managment  treatment plan. Goal Not Met . Over the  next 30 days, patient will have received resources pertaining to free legal services to assist with applying for SSDI, and resources for [employer] financial assistance program. Goal Met  . 04/08/19 Over the next 60 days, patient will be re-established with a Pain Clinic and verbalize having effective pain management.   CCM RN CM Interventions:  04/08/19 completed call with patient   . Evaluation of current treatment plan related to pain management and patient's adherence to plan as established by provider - patient has been receiving Pain management from [facility] - he was prescribed Morphine verses Oxycodone and reports he experienced bothersome SE . Discussed patient stopped by PCP office today to request a referral to [facility] - he previously received services from this clinic and states he received effective pain management  . Discussed patient has a f/u appointment with Minette Brine, FNP scheduled for 04/11/19 @ 3:30 PM - patient states he plans to keep the appointment  . Discussed plans with patient for ongoing care management follow up and provided patient with direct contact information for care management team . Sent in-basket message to provider Minette Brine, FNP with an update on patient's request for a Pain referral to Dr. Lennox Grumbles  Patient Self Care Activities:  . Currently UNABLE TO independently ambulate short distances, or perform physical manual labor . Self administers medications as prescribed . Attends all scheduled provider appointments . Calls pharmacy for medication refills . Performs ADL's independently with difficulty  . Performs IADL's independently with difficulty . Calls provider office for new concerns or questions  Please see past updates related to this goal by clicking on the "Past Updates" button in the selected goal         Telephone follow up appointment with care management team member scheduled for: 04/29/19   Barb Merino,  RN, BSN, CCM Care Management Coordinator [place] [facility]  Direct Phone: [phone]

## 2019-04-16 ENCOUNTER — Encounter: Payer: Self-pay | Admitting: Nurse Practitioner

## 2019-04-29 ENCOUNTER — Telehealth: Payer: Self-pay

## 2019-05-02 ENCOUNTER — Ambulatory Visit: Payer: Self-pay

## 2019-05-02 ENCOUNTER — Other Ambulatory Visit: Payer: Self-pay | Admitting: Nurse Practitioner

## 2019-05-02 DIAGNOSIS — G8921 Chronic pain due to trauma: Secondary | ICD-10-CM

## 2019-05-02 DIAGNOSIS — I2601 Septic pulmonary embolism with acute cor pulmonale: Secondary | ICD-10-CM

## 2019-05-02 DIAGNOSIS — I2782 Chronic pulmonary embolism: Secondary | ICD-10-CM

## 2019-05-02 DIAGNOSIS — M5412 Radiculopathy, cervical region: Secondary | ICD-10-CM

## 2019-05-02 NOTE — Progress Notes (Signed)
     Re 

## 2019-05-03 NOTE — Patient Instructions (Signed)
Visit Information  Goals Addressed      Patient Stated   . "I am trying to get approved for Medicaid" (pt-stated)       Current Barriers:  . Film/video editor.  . Difficulty obtaining medications . Chronic Disease Management support and education needs related to community resources, pain management and SSDI/Medicaid resources and assistance  Nurse Case Manager Clinical Goal(s):  Marland Kitchen Over the next 60 days, patient will verbalize having received a final response from Medicaid with his approval/denial.   CCM RN CM Interventions:  05/02/19 call completed with patient   . Assessed for progress with Medicaid application process - pt reports he is still waiting for the final decision regarding his Medicaid approval/denial . Assessed for progress toward applying for SSDI - patient has hired legal aid and has started this process . Provided patient with additional resources to check for eligibility for assistance with receiving medical care through [facility] program and or the [employer] available for residents of [facility] who have low income and or are uninsured - provided patient with the website address and the contact numbers for each of these resources . Discussed plans with patient for ongoing care management follow up and provided patient with direct contact information for care management team  Patient Self Care Activities performs with difficulty . Self administers medications as prescribed . Calls pharmacy for medication refills . Performs ADL's independently . Performs IADL's independently . Calls provider office for new concerns or questions  Please see past updates related to this goal by clicking on the "Past Updates" button in the selected goal      . 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 Film/video editor.   . Transportation barriers . Difficulty obtaining medications  Nurse Case Manager Clinical Goal(s):  Marland Kitchen Over the next 30 days, patient will verbalize understanding of plan for ongoing pain managment  treatment plan. Goal Not Met . Over the next 30 days, patient will have received resources pertaining to free legal services to assist with applying for SSDI, and resources for [employer] financial assistance program. Goal Met  . 04/08/19 Over the next 60 days, patient will be re-established with a Pain Clinic and verbalize having effective pain management.   CCM RN CM Interventions:  05/02/19 completed call with patient   . Evaluation of current treatment plan related to pain management and patient's adherence to plan as established by provider - patient has been receiving Pain management from [facility] - he has asked provider Minette Brine, FNP for a referral to [facility] - discussed this referral has not been completed however Janece made not of it in his OV note from 04/11/19  . Discussed RNCM will f/u with provider and notify Mr. Nannini once the referral has been sent o Reviewed current pain management regimen with patient - he continues to be on the following pain regimen:  oxyCODONE (OXYCONTIN) 15 mg 12 hr tablet, Take 15 mg by mouth every 12 (twelve) hours oxycodone (ROXICODONE) 30 MG immediate release tablet, Take 30 mg by mouth 3 (three) times daily . Sent message to provider Minette Brine, FNP requesting an update on when and if the referral to [facility] will be sent - reply from [facility] received stating she approved the referral and it will be sent asap . Discussed  plans with patient for ongoing care management follow up and provided patient with direct contact information for care management team  Patient Self Care Activities:  . Self administers medications as prescribed . Attends all scheduled provider appointments . Calls pharmacy for medication  refills . Performs ADL's independently with difficulty  . Performs IADL's independently with difficulty . Calls provider office for new concerns or questions  Please see past updates related to this goal by clicking on the "Past Updates" button in the selected goal        The patient verbalized understanding of instructions provided today and declined a print copy of patient instruction materials.   Follow up with provider re: referral to [place], RN, BSN, CCM Care Management Coordinator [place] [facility]  Direct Phone: [phone]

## 2019-05-03 NOTE — Chronic Care Management (AMB) (Signed)
Care Management   Follow Up Note   05/02/2019 Name: Saladin Petrelli MRN: [id number] DOB: May 10, 1981  Referred by: Shelby Mattocks, PA-C Reason for referral : Care Coordination ([place] Telephone Follow up)   Dennard Vezina is a 38 y.o. year old male who is a primary care patient of [place], Sunday Spill[place], [place]. The CCM team was consulted for assistance with chronic disease management and care coordination needs.    Review of patient status, including review of consultants reports, relevant laboratory and other test results, and collaboration with appropriate care team members and the patient's provider was performed as part of comprehensive patient evaluation and provision of chronic care management services.    I spoke with Mr. Soltis by telephone today for a CM follow up.   Goals Addressed      Patient Stated   . "I am trying to get approved for Medicaid" (pt-stated)       Current Barriers:  . Film/video editor.  . Difficulty obtaining medications . Chronic Disease Management support and education needs related to community resources, pain management and SSDI/Medicaid resources and assistance  Nurse Case Manager Clinical Goal(s):  Marland Kitchen Over the next 60 days, patient will verbalize having received a final response from Medicaid with his approval/denial.   CCM RN CM Interventions:  05/02/19 call completed with patient   . Assessed for progress with Medicaid application process - pt reports he is still waiting for the final decision regarding his Medicaid approval/denial . Assessed for progress toward applying for SSDI - patient has hired legal aid and has started this process . Provided patient with additional resources to check for eligibility for assistance with receiving medical care through [facility] program and or the [employer] available for residents of [facility] who have low income and or are uninsured - provided patient  with the website address and the contact numbers for each of these resources . Discussed plans with patient for ongoing care management follow up and provided patient with direct contact information for care management team  Patient Self Care Activities performs with difficulty . Self administers medications as prescribed . Calls pharmacy for medication refills . Performs ADL's independently . Performs IADL's independently . Calls provider office for new concerns or questions  Please see past updates related to this goal by clicking on the "Past Updates" button in the selected goal      . 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 Film/video editor.  . Transportation barriers . Difficulty obtaining medications  Nurse Case Manager Clinical Goal(s):  Marland Kitchen Over the next 30 days, patient will verbalize understanding of plan for ongoing pain managment  treatment plan. Goal Not Met . Over the next 30 days, patient will have received resources pertaining to free legal services to assist with applying for SSDI, and resources for [employer] financial assistance program. Goal Met  . 04/08/19 Over the next 60 days, patient will be re-established with a Pain Clinic and verbalize having effective pain management.   CCM RN CM Interventions:  05/02/19 completed call with patient   . Evaluation of current treatment plan related to pain management and patient's adherence to plan as established by provider - patient has been receiving Pain management from [facility] - he has asked provider Minette Brine, FNP for a referral to [facility] - discussed  this referral has not been completed however Janece made not of it in his OV note from 04/11/19  . Discussed RNCM will f/u with provider and notify Mr. Pavek once the referral has been sent o Reviewed current pain  management regimen with patient - he continues to be on the following pain regimen:  oxyCODONE (OXYCONTIN) 15 mg 12 hr tablet, Take 15 mg by mouth every 12 (twelve) hours oxycodone (ROXICODONE) 30 MG immediate release tablet, Take 30 mg by mouth 3 (three) times daily . Sent message to provider Minette Brine, FNP requesting an update on when and if the referral to [facility] will be sent - reply from [facility] received stating she has approved the referral and it will be sent asap . Discussed plans with patient for ongoing care management follow up and provided patient with direct contact information for care management team  Patient Self Care Activities:  . Self administers medications as prescribed . Attends all scheduled provider appointments . Calls pharmacy for medication refills . Performs ADL's independently with difficulty  . Performs IADL's independently with difficulty . Calls provider office for new concerns or questions  Please see past updates related to this goal by clicking on the "Past Updates" button in the selected goal         Follow up with provider re: referral to [place], RN, BSN, CCM Care Management Coordinator [facility]  Direct Phone: [phone]

## 2019-05-10 ENCOUNTER — Telehealth: Payer: Self-pay

## 2019-05-13 ENCOUNTER — Other Ambulatory Visit: Payer: Self-pay | Admitting: Nurse Practitioner

## 2019-05-13 DIAGNOSIS — G8929 Other chronic pain: Secondary | ICD-10-CM

## 2019-05-13 DIAGNOSIS — G8921 Chronic pain due to trauma: Secondary | ICD-10-CM

## 2019-05-21 ENCOUNTER — Telehealth: Payer: Self-pay

## 2019-06-20 ENCOUNTER — Ambulatory Visit: Payer: Self-pay

## 2019-06-20 ENCOUNTER — Telehealth: Payer: Self-pay

## 2019-06-20 DIAGNOSIS — I2601 Septic pulmonary embolism with acute cor pulmonale: Secondary | ICD-10-CM

## 2019-06-20 DIAGNOSIS — M5412 Radiculopathy, cervical region: Secondary | ICD-10-CM

## 2019-06-20 DIAGNOSIS — G8929 Other chronic pain: Secondary | ICD-10-CM

## 2019-06-20 DIAGNOSIS — I2782 Chronic pulmonary embolism: Secondary | ICD-10-CM

## 2019-06-20 NOTE — Chronic Care Management (AMB) (Signed)
  Care Management   Outreach Note  06/20/2019 Name: Jason [name] MRN: [id number] DOB: 1981-06-16  Referred by: Shelby Mattocks, PA-C Reason for referral : Care Coordination ([place] Telephone Follow up )   An unsuccessful telephone outreach was attempted today. The patient was referred to the case management team by Laurita Quint for assistance with chronic care management and care coordination.   Follow Up Plan: A HIPPA compliant phone message was left for the patient providing contact information and requesting a return call. Telephone follow up appointment with care management team member scheduled for: 07/08/19   Barb Merino, RN, BSN, CCM Care Management Coordinator [facility]  Direct Phone: [phone]

## 2019-07-08 ENCOUNTER — Telehealth: Payer: Self-pay

## 2019-07-09 ENCOUNTER — Ambulatory Visit: Payer: Self-pay

## 2019-07-09 DIAGNOSIS — I2782 Chronic pulmonary embolism: Secondary | ICD-10-CM

## 2019-07-09 DIAGNOSIS — M5412 Radiculopathy, cervical region: Secondary | ICD-10-CM

## 2019-07-09 DIAGNOSIS — G8921 Chronic pain due to trauma: Secondary | ICD-10-CM

## 2019-07-09 DIAGNOSIS — I2601 Septic pulmonary embolism with acute cor pulmonale: Secondary | ICD-10-CM

## 2019-07-10 NOTE — Patient Instructions (Signed)
Visit Information  Goals Addressed      Patient Stated   . COMPLETED: "I am trying to get approved for Medicaid" (pt-stated)       Current Barriers:  . Financial Constraints.  . Difficulty obtaining medications . Chronic Disease Management support and education needs related to community resources, pain management and SSDI/Medicaid resources and assistance  Nurse Case Manager Clinical Goal(s):  . Over the next 60 days, patient will verbalize having received a final response from Medicaid with his approval/denial. Goal Met  CCM RN CM Interventions:  07/09/19 call completed with patient   . Assessed for progress with Medicaid application process - pt reports he received a denial for Medicaid, he will not appeal at this time . Assessed for progress toward applying for SSDI - patient has hired legal aid and has started this process . Provided patient with additional resources to check for eligibility for assistance with receiving medical care through [employer] [facility] Charity program and or the [employer] program available for residents of [place] who have low income and or are uninsured - provided patient with the website address and the contact numbers for each of these resources  Patient Self Care Activities performs with difficulty . Self administers medications as prescribed . Calls pharmacy for medication refills . Performs ADL's independently . Performs IADL's independently . Calls provider office for new concerns or questions  Please see past updates related to this goal by clicking on the "Past Updates" button in the selected goal      . COMPLETED: "I have some new open wounds on my arms" (pt-stated)       Current Barriers:  . Impaired Skin Integrity  . Risk for Infection   Nurse Case Manager Clinical Goal(s):  . Over the next 30 days, patient will work with provider to address needs related to treatment management for impaired skin integrity and open wounds to  bilateral forearms.   Goal Met  CCM RN CM Interventions:  07/09/19 completed call with patient  . Evaluation of current treatment plan related to open wounds on bilateral forearms and patient's adherence to plan as established by provider. . Patient reports all wounds have healed at this time   Patient Self Care Activities:  . Self administers medications as prescribed . Calls pharmacy for medication refills . Performs ADL's independently . Performs IADL's independently . Calls provider office for new concerns or questions  Please see past updates related to this goal by clicking on the "Past Updates" button in the selected goal      . COMPLETED: "I need to get my pain under control"  (pt-stated)       Current Barriers:  . 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 Financial Constraints.  . Transportation barriers . Difficulty obtaining medications  Nurse Case Manager Clinical Goal(s):  . Over the next 30 days, patient will verbalize understanding of plan for ongoing pain managment  treatment plan. Goal Not Met . Over the next 30 days, patient will have received resources pertaining to free legal services to assist with applying for SSDI, and resources for [place] [employer] financial assistance program. Goal Met  . 04/08/19 Over the next 60 days, patient will be re-established with a Pain Clinic and verbalize having effective pain management. Goal Met  CCM RN CM Interventions:  07/09/19 completed call with patient   . Evaluation of current treatment plan related to pain management and patient's adherence to plan as established by   provider - Patient is now re-established with [facility] - he reports his pain is now better controlled and his quality of life has improved . Discussed patient is satisfied with his pain management and requires no further CC follow up at this time  Patient Self Care Activities:  . Self administers  medications as prescribed . Attends all scheduled provider appointments . Calls pharmacy for medication refills . Performs ADL's independently with difficulty  . Performs IADL's independently with difficulty . Calls provider office for new concerns or questions  Please see past updates related to this goal by clicking on the "Past Updates" button in the selected goal          The patient verbalized understanding of instructions provided today and declined a print copy of patient instruction materials.   The patient has been provided with contact information for the care management team and has been advised to call with any health related questions or concerns.   Barb Merino, RN, BSN, CCM Care Management Coordinator [facility]  Direct Phone: [phone]

## 2019-07-10 NOTE — Chronic Care Management (AMB) (Signed)
Care Management    07/09/2019 Name: Chavis Rajkumar MRN: [id number] DOB: 04/29/1981  Referred by: Rodriguez-Southworth, Sylvia, PA-C Reason for referral : Care Coordination ([facility] RNCM Telephone Follow up)   Mr. Alben Gambrell has met all care management goals. Review of patient status, including review of consultants reports, relevant laboratory and other test results, and collaboration with appropriate care team members and the patient's provider was performed as part of comprehensive patient evaluation and provision of care management services. Mr. Izrael Jahn agrees that all patient stated goals have been met. The care management team has provided direct contact information to Mr. Jef Tabak and has informed him/her that we are available at any time to assist with care management needs should they arise.    I spoke with Mr. Tsang by telephone today for a CC RN follow up.   Goals Addressed      Patient Stated   . COMPLETED: "I am trying to get approved for Medicaid" (pt-stated)       Current Barriers:  . Financial Constraints.  . Difficulty obtaining medications . Chronic Disease Management support and education needs related to community resources, pain management and SSDI/Medicaid resources and assistance  Nurse Case Manager Clinical Goal(s):  . Over the next 60 days, patient will verbalize having received a final response from Medicaid with his approval/denial. Goal Met  CCM RN CM Interventions:  07/09/19 call completed with patient   . Assessed for progress with Medicaid application process - pt reports he received a denial for Medicaid, he will not appeal at this time . Assessed for progress toward applying for SSDI - patient has hired legal aid and has started this process . Provided patient with additional resources to check for eligibility for assistance with receiving medical care through [employer] Charity program and or the [employer] program available for  residents of [place] who have low income and or are uninsured - provided patient with the website address and the contact numbers for each of these resources  Patient Self Care Activities performs with difficulty . Self administers medications as prescribed . Calls pharmacy for medication refills . Performs ADL's independently . Performs IADL's independently . Calls provider office for new concerns or questions  Please see past updates related to this goal by clicking on the "Past Updates" button in the selected goal      . COMPLETED: "I have some new open wounds on my arms" (pt-stated)       Current Barriers:  . Impaired Skin Integrity  . Risk for Infection   Nurse Case Manager Clinical Goal(s):  . Over the next 30 days, patient will work with provider to address needs related to treatment management for impaired skin integrity and open wounds to bilateral forearms.   Goal Met  CCM RN CM Interventions:  07/09/19 completed call with patient  . Evaluation of current treatment plan related to open wounds on bilateral forearms and patient's adherence to plan as established by provider. . Patient reports all wounds have healed at this time   Patient Self Care Activities:  . Self administers medications as prescribed . Calls pharmacy for medication refills . Performs ADL's independently . Performs IADL's independently . Calls provider office for new concerns or questions  Please see past updates related to this goal by clicking on the "Past Updates" button in the selected goal      . COMPLETED: "I need to get my pain under control"  (pt-stated)         Current Barriers:  . 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 Financial Constraints.  . Transportation barriers . Difficulty obtaining medications  Nurse Case Manager Clinical Goal(s):  . Over the next 30 days, patient will verbalize understanding of plan for ongoing pain managment   treatment plan. Goal Not Met . Over the next 30 days, patient will have received resources pertaining to free legal services to assist with applying for SSDI, and resources for [place]'s [employer] financial assistance program. Goal Met  . 04/08/19 Over the next 60 days, patient will be re-established with a Pain Clinic and verbalize having effective pain management. Goal Met  CCM RN CM Interventions:  07/09/19 completed call with patient   . Evaluation of current treatment plan related to pain management and patient's adherence to plan as established by provider - Patient is now re-established with [facility] - he reports his pain is now better controlled and his quality of life has improved . Discussed patient is satisfied with his pain management and requires no further CC follow up at this time  Patient Self Care Activities:  . Self administers medications as prescribed . Attends all scheduled provider appointments . Calls pharmacy for medication refills . Performs ADL's independently with difficulty  . Performs IADL's independently with difficulty . Calls provider office for new concerns or questions  Please see past updates related to this goal by clicking on the "Past Updates" button in the selected goal         Follow up plan: The patient has been provided with contact information for the care management team and has been advised to call with any health related questions or concerns.   Angel Little, RN, BSN, CCM Care Management Coordinator [facility]  Direct Phone: [phone]   

## 2019-08-24 ENCOUNTER — Other Ambulatory Visit: Payer: Self-pay

## 2019-08-24 ENCOUNTER — Encounter (HOSPITAL_COMMUNITY): Payer: Self-pay

## 2019-08-24 ENCOUNTER — Emergency Department (HOSPITAL_COMMUNITY)
Admission: EM | Admit: 2019-08-24 | Discharge: 2019-08-24 | Disposition: A | Discharge status: Home / Self Care | Payer: Self-pay | Attending: Emergency Medicine | Admitting: Emergency Medicine

## 2019-08-24 ENCOUNTER — Emergency Department (HOSPITAL_COMMUNITY): Payer: Self-pay

## 2019-08-24 DIAGNOSIS — Z7901 Long term (current) use of anticoagulants: Secondary | ICD-10-CM | POA: Insufficient documentation

## 2019-08-24 DIAGNOSIS — R42 Dizziness and giddiness: Secondary | ICD-10-CM | POA: Insufficient documentation

## 2019-08-24 DIAGNOSIS — R531 Weakness: Secondary | ICD-10-CM | POA: Insufficient documentation

## 2019-08-24 DIAGNOSIS — Z9119 Patient's noncompliance with other medical treatment and regimen: Secondary | ICD-10-CM | POA: Insufficient documentation

## 2019-08-24 DIAGNOSIS — F17210 Nicotine dependence, cigarettes, uncomplicated: Secondary | ICD-10-CM | POA: Insufficient documentation

## 2019-08-24 LAB — DIFFERENTIAL
Abs Immature Granulocytes: 0.01 10*3/uL (ref 0.00–0.07)
Basophils Absolute: 0 10*3/uL (ref 0.0–0.1)
Basophils Relative: 0 %
Eosinophils Absolute: 0 10*3/uL (ref 0.0–0.5)
Eosinophils Relative: 0 %
Immature Granulocytes: 0 %
Lymphocytes Relative: 20 %
Lymphs Abs: 1.3 10*3/uL (ref 0.7–4.0)
Monocytes Absolute: 0.6 10*3/uL (ref 0.1–1.0)
Monocytes Relative: 9 %
Neutro Abs: 4.7 10*3/uL (ref 1.7–7.7)
Neutrophils Relative %: 71 %

## 2019-08-24 LAB — PROTIME-INR
INR: 1.2 (ref 0.8–1.2)
Prothrombin Time: 15 seconds (ref 11.4–15.2)

## 2019-08-24 LAB — CBC
HCT: 44.9 % (ref 39.0–52.0)
Hemoglobin: 15.1 g/dL (ref 13.0–17.0)
MCH: 26.9 pg (ref 26.0–34.0)
MCHC: 33.6 g/dL (ref 30.0–36.0)
MCV: 79.9 fL — ABNORMAL LOW (ref 80.0–100.0)
Platelets: 343 10*3/uL (ref 150–400)
RBC: 5.62 MIL/uL (ref 4.22–5.81)
RDW: 14.1 % (ref 11.5–15.5)
WBC: 6.6 10*3/uL (ref 4.0–10.5)
nRBC: 0 % (ref 0.0–0.2)

## 2019-08-24 LAB — COMPREHENSIVE METABOLIC PANEL
ALT: 22 U/L (ref 0–44)
AST: 22 U/L (ref 15–41)
Albumin: 4.2 g/dL (ref 3.5–5.0)
Alkaline Phosphatase: 49 U/L (ref 38–126)
Anion gap: 14 (ref 5–15)
BUN: 11 mg/dL (ref 6–20)
CO2: 20 mmol/L — ABNORMAL LOW (ref 22–32)
Calcium: 9.5 mg/dL (ref 8.9–10.3)
Chloride: 102 mmol/L (ref 98–111)
Creatinine, Ser: 0.94 mg/dL (ref 0.61–1.24)
GFR calc Af Amer: >60 mL/min (ref >60)
GFR calc non Af Amer: >60 mL/min (ref >60)
Glucose, Bld: 111 mg/dL — ABNORMAL HIGH (ref 70–99)
Potassium: 4.7 mmol/L (ref 3.5–5.1)
Sodium: 136 mmol/L (ref 135–145)
Total Bilirubin: 0.8 mg/dL (ref 0.3–1.2)
Total Protein: 7.8 g/dL (ref 6.5–8.1)

## 2019-08-24 LAB — D-DIMER, QUANTITATIVE: D-Dimer, Quant: <0.27 ug/mL-FEU (ref 0.00–0.50)

## 2019-08-24 LAB — CBG MONITORING, ED: Glucose-Capillary: 113 mg/dL — ABNORMAL HIGH (ref 70–99)

## 2019-08-24 LAB — APTT: aPTT: 35 seconds (ref 24–36)

## 2019-08-24 MED ORDER — SODIUM CHLORIDE 0.9 % IV BOLUS
1000.0000 mL | Freq: Once | INTRAVENOUS | Status: AC
  Administered 2019-08-24: 1000 mL via INTRAVENOUS

## 2019-08-24 MED ORDER — DIAZEPAM 5 MG PO TABS: 5.0000 mg | ORAL_TABLET | Freq: Two times a day (BID) | ORAL | 0 refills | Status: DC

## 2019-08-24 MED ORDER — LORAZEPAM 2 MG/ML IJ SOLN
1.0000 mg | Freq: Once | INTRAMUSCULAR | Status: AC
  Administered 2019-08-24: 1 mg via INTRAVENOUS
  Filled 2019-08-24: qty 1

## 2019-08-24 MED ORDER — ONDANSETRON HCL 4 MG/2ML IJ SOLN
4.0000 mg | Freq: Once | INTRAMUSCULAR | Status: AC
  Administered 2019-08-24: 4 mg via INTRAVENOUS
  Filled 2019-08-24: qty 2

## 2019-08-24 MED ORDER — LORAZEPAM 2 MG/ML IJ SOLN
1.0000 mg | Freq: Once | INTRAMUSCULAR | Status: AC
  Administered 2019-08-24: 16:00:00 1 mg via INTRAVENOUS
  Filled 2019-08-24: qty 1

## 2019-08-24 MED ORDER — ONDANSETRON 4 MG PO TBDP: 4.0000 mg | ORAL_TABLET | Freq: Three times a day (TID) | ORAL | 0 refills | Status: DC | PRN

## 2019-08-24 MED ORDER — MECLIZINE HCL 25 MG PO TABS: 25.0000 mg | ORAL_TABLET | Freq: Three times a day (TID) | ORAL | 0 refills | Status: DC | PRN

## 2019-08-24 MED ORDER — MECLIZINE HCL 25 MG PO TABS
25.0000 mg | ORAL_TABLET | Freq: Once | ORAL | Status: AC
  Administered 2019-08-24: 25 mg via ORAL
  Filled 2019-08-24: qty 1

## 2019-08-24 MED ORDER — SODIUM CHLORIDE 0.9% FLUSH: 3.0000 mL | Freq: Once | INTRAVENOUS | Status: DC

## 2019-08-24 NOTE — ED Notes (Signed)
Patient verbalizes understanding of discharge instructions . Opportunity for questions and answers were provided . Armband removed by staff ,Pt discharged from ED. W/C  offered at D/C  and Declined W/C at D/C and was escorted to lobby by RN.  

## 2019-08-24 NOTE — ED Provider Notes (Signed)
[facility] EMERGENCY DEPARTMENT Provider Note   CSN: [id number] Arrival date & time: 08/24/19  1340     History   Chief Complaint Chief Complaint  Patient presents with  . Dizziness  . Weakness    HPI Jason [name] is a 38 y.o. male.     Pt presents to the ED today with dizziness.  Pt said he developed weakness and dizziness which started 2 hrs ago.  Pt said he's had these sx in the past, but it has never lasted this long.  The pt also said he's not taken his Xarelto in 1 month.  He denies sob or cp.  No f/c.      Past Medical History:  Diagnosis Date  . Anxiety   . Back injury   . Cervical disc disease   . Chronic back pain   . Chronic low back pain 03/29/2016  . Family history of adverse reaction to anesthesia   . GERD (gastroesophageal reflux disease)   . History of kidney stones    per mother- "there was a diagnoses at some point"  . Insomnia   . Lumbar disc disease   . MIGRAINE, COMMON 09/23/2010   Qualifier: Diagnosis of  By: Jenny Reichmann MD, Hunt Oris   . Pneumonia   . Sickle cell trait [facility])     Patient Active Problem List   Diagnosis Date Noted  . 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 Uncomplicated opioid dependence ([place]) 01/08/2019  . Cervicalgia 12/13/2018  . Acute low back pain with bilateral sciatica 12/13/2018  . Chronic pain due to trauma 12/13/2018  . Cervical radiculopathy 12/13/2018  . Cocaine abuse, episodic ([place]) 12/11/2018  . Thrombocytosis ([place]) 12/05/2018  . Lactic acidosis 12/04/2018  . Pulmonary embolism ([place]) 11/27/2018  . S/P lumbar laminectomy 08/29/2017  . Spinal stenosis of lumbar region 08/28/2017    Class: Chronic  . Status post lumbar microdiscectomy 08/28/2017  . 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 Gross hematuria 04/01/2016  . Depression 04/01/2016  . Chronic low back pain 03/29/2016  . Cough 08/05/2015  . Encounter for well adult  exam with abnormal findings 02/17/2015  . Lumbar disc disease   . Cervical disc disease   . GERD (gastroesophageal reflux disease)   . Anxiety   . Insomnia   . CONSTIPATION 10/05/2010  . MIGRAINE, COMMON 09/23/2010  . Abdominal pain, other specified site 09/23/2010    Past Surgical History:  Procedure Laterality Date  . BACK SURGERY    . LUMBAR LAMINECTOMY/DECOMPRESSION MICRODISCECTOMY Bilateral 08/28/2017   Procedure: BILATERAL PARTIAL HEMILAMINECTOMIES L4-5 WITH MICRODISCECTOMY;  Surgeon: Jessy Oto, MD;  Location: [facility];  Service: Orthopedics;  Laterality: Bilateral;  . NO PAST SURGERIES          Home Medications    Prior to Admission medications   Medication Sig Start Date End Date Taking? Authorizing Provider  gabapentin (NEURONTIN) 300 MG capsule Take 300 mg by mouth 3 (three) times daily.   Yes [provider]  oxycodone (ROXICODONE) 30 MG immediate release tablet Take 30 mg by mouth 3 (three) times daily.   Yes [provider]  rivaroxaban (XARELTO) 20 MG TABS tablet Take 1 tablet (20 mg total) by mouth daily with supper. 04/11/19  Yes Minette Brine, FNP  diazepam (VALIUM) 5 MG tablet Take 1 tablet (5 mg total) by mouth 2 (two) times daily. 08/24/19   Isla Pence, MD  meclizine Johnathan Hausen)  25 MG tablet Take 1 tablet (25 mg total) by mouth 3 (three) times daily as needed for dizziness. 08/24/19   Isla Pence, MD  ondansetron (ZOFRAN ODT) 4 MG disintegrating tablet Take 1 tablet (4 mg total) by mouth every 8 (eight) hours as needed. 08/24/19   Isla Pence, MD    Family History Family History  Problem Relation Age of Onset  . Congestive Heart Failure Father   . Hypertension Mother   . Hyperlipidemia Mother   . Stroke Mother     Social History Social History   Tobacco Use  . Smoking status: Current Every Day Smoker    Packs/day: 1.00    Years: 24.00    Pack years: 24.00    Types: Cigarettes  . Smokeless tobacco: Never Used  .  Tobacco comment: notified primary care provider and CCM RNCM  Substance Use Topics  . Alcohol use: Yes    Frequency: Never    Comment: former use - heavy drinker 8 years ago  . Drug use: No     Allergies   Morphine and related and Other   Review of Systems Review of Systems  Gastrointestinal: Positive for nausea.  Neurological: Positive for dizziness.  All other systems reviewed and are negative.    Physical Exam Updated Vital Signs BP 113/72   Pulse 70   Temp 98.8 F (37.1 C) (Oral)   Resp 17   SpO2 98%   Physical Exam Vitals signs and nursing note reviewed.  Constitutional:      Appearance: Normal appearance.  HENT:     Head: Normocephalic and atraumatic.     Right Ear: External ear normal.     Left Ear: External ear normal.     Nose: Nose normal.     Mouth/Throat:     Mouth: Mucous membranes are moist.     Pharynx: Oropharynx is clear.  Eyes:     Extraocular Movements: Extraocular movements intact.     Conjunctiva/sclera: Conjunctivae normal.     Pupils: Pupils are equal, round, and reactive to light.  Neck:     Musculoskeletal: Normal range of motion and neck supple.  Cardiovascular:     Rate and Rhythm: Normal rate and regular rhythm.     Pulses: Normal pulses.     Heart sounds: Normal heart sounds.  Pulmonary:     Effort: Pulmonary effort is normal.     Breath sounds: Normal breath sounds.  Abdominal:     General: Abdomen is flat. Bowel sounds are normal.     Palpations: Abdomen is soft.  Musculoskeletal: Normal range of motion.  Skin:    General: Skin is warm.     Capillary Refill: Capillary refill takes less than 2 seconds.  Neurological:     General: No focal deficit present.     Mental Status: He is alert and oriented to person, place, and time.  Psychiatric:        Mood and Affect: Mood normal.        Behavior: Behavior normal.        Thought Content: Thought content normal.        Judgment: Judgment normal.      ED Treatments /  Results  Labs (all labs ordered are listed, but only abnormal results are displayed) Labs Reviewed  CBC - Abnormal; Notable for the following components:      Result Value   MCV 79.9 (*)    All other components within normal limits  COMPREHENSIVE METABOLIC PANEL -  Abnormal; Notable for the following components:   CO2 20 (*)    Glucose, Bld 111 (*)    All other components within normal limits  CBG MONITORING, ED - Abnormal; Notable for the following components:   Glucose-Capillary 113 (*)    All other components within normal limits  PROTIME-INR  APTT  DIFFERENTIAL  D-DIMER, QUANTITATIVE (NOT AT [facility])  CBG MONITORING, ED    EKG EKG Interpretation  Date/Time:  Saturday August 24 2019 13:48:07 EST Ventricular Rate:  73 PR Interval:  132 QRS Duration: 88 QT Interval:  388 QTC Calculation: 427 R Axis:   72 Text Interpretation: Normal sinus rhythm with sinus arrhythmia Minimal voltage criteria for LVH, may be normal variant ( Sokolow-Lyon ) Borderline ECG No significant change since last tracing Confirmed by Isla Pence [phone]) on 08/24/2019 3:10:17 PM   Radiology Ct Head Wo Contrast  Result Date: 08/24/2019 CLINICAL DATA:  Weakness and dizziness, possible stroke EXAM: CT HEAD WITHOUT CONTRAST TECHNIQUE: Contiguous axial images were obtained from the base of the skull through the vertex without intravenous contrast. COMPARISON:  None. FINDINGS: Brain: No evidence of acute infarction, hemorrhage, hydrocephalus, extra-axial collection or mass lesion/mass effect. Vascular: No hyperdense vessel or unexpected calcification. Skull: Normal. Negative for fracture or focal lesion. Sinuses/Orbits: No acute finding. Other: None. IMPRESSION: No acute intracranial pathology. Electronically Signed   By: Eddie Candle M.D.   On: 08/24/2019 14:43    Procedures Procedures (including critical care time)  Medications Ordered in ED Medications  sodium chloride flush (NS) 0.9 % injection 3 mL  (has no administration in time range)  sodium chloride 0.9 % bolus 1,000 mL (1,000 mLs Intravenous New Bag/Given 08/24/19 1537)  LORazepam (ATIVAN) injection 1 mg (1 mg Intravenous Given 08/24/19 1533)  ondansetron (ZOFRAN) injection 4 mg (4 mg Intravenous Given 08/24/19 1533)  meclizine (ANTIVERT) tablet 25 mg (25 mg Oral Given 08/24/19 1640)  LORazepam (ATIVAN) injection 1 mg (1 mg Intravenous Given 08/24/19 1641)     Initial Impression / Assessment and Plan / ED Course  I have reviewed the triage vital signs and the nursing notes.  Pertinent labs & imaging results that were available during my care of the patient were reviewed by me and considered in my medical decision making (see chart for details).  Labs/CT reviewed.    Pt is feeling better after treatment.  He is stable for d/c.  Return if worse.  Final Clinical Impressions(s) / ED Diagnoses   Final diagnoses:  Vertigo    ED Discharge Orders         Ordered    meclizine (ANTIVERT) 25 MG tablet  3 times daily PRN     08/24/19 1823    diazepam (VALIUM) 5 MG tablet  2 times daily     08/24/19 1823    ondansetron (ZOFRAN ODT) 4 MG disintegrating tablet  Every 8 hours PRN     08/24/19 1823           Isla Pence, MD 08/24/19 1824

## 2019-08-24 NOTE — ED Triage Notes (Addendum)
Pt from home with ems for c.o generalized weakness and dizziness. Dizziness started 2 hrs ago, denies pain.  Peaked T waves on EKG 142/98 HR 70 CBG 111 Pt alert  Pt reports he has not been taking his xarelto in 1 month, hx of PEs

## 2019-09-19 ENCOUNTER — Ambulatory Visit: Payer: Self-pay | Admitting: Surgery

## 2019-10-24 ENCOUNTER — Ambulatory Visit: Payer: Self-pay | Admitting: Surgery

## 2019-10-30 ENCOUNTER — Ambulatory Visit: Payer: Self-pay

## 2019-10-30 ENCOUNTER — Other Ambulatory Visit: Payer: Self-pay

## 2019-10-30 ENCOUNTER — Ambulatory Visit (INDEPENDENT_AMBULATORY_CARE_PROVIDER_SITE_OTHER): Payer: BLUE CROSS/BLUE SHIELD | Admitting: Surgery

## 2019-10-30 ENCOUNTER — Encounter: Payer: Self-pay | Admitting: Surgery

## 2019-10-30 VITALS — BP 129/97 | HR 82

## 2019-10-30 DIAGNOSIS — M5442 Lumbago with sciatica, left side: Secondary | ICD-10-CM

## 2019-10-30 DIAGNOSIS — M5441 Lumbago with sciatica, right side: Secondary | ICD-10-CM | POA: Diagnosis not present

## 2019-10-30 DIAGNOSIS — M4722 Other spondylosis with radiculopathy, cervical region: Secondary | ICD-10-CM
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
## 2019-11-09 ENCOUNTER — Inpatient Hospital Stay (HOSPITAL_COMMUNITY)
Admission: EM | Admit: 2019-11-09 | Discharge: 2019-11-14 | DRG: 871 | Disposition: A | Discharge status: Home / Self Care | Payer: BLUE CROSS/BLUE SHIELD | Attending: Internal Medicine | Admitting: Internal Medicine

## 2019-11-09 ENCOUNTER — Emergency Department (HOSPITAL_COMMUNITY): Payer: BLUE CROSS/BLUE SHIELD

## 2019-11-09 DIAGNOSIS — E872 Acidosis, unspecified: Secondary | ICD-10-CM | POA: Diagnosis present

## 2019-11-09 DIAGNOSIS — K2980 Duodenitis without bleeding: Secondary | ICD-10-CM | POA: Diagnosis present

## 2019-11-09 DIAGNOSIS — N390 Urinary tract infection, site not specified: Secondary | ICD-10-CM | POA: Diagnosis present

## 2019-11-09 DIAGNOSIS — Z20822 Contact with and (suspected) exposure to covid-19: Secondary | ICD-10-CM | POA: Diagnosis present

## 2019-11-09 DIAGNOSIS — F1011 Alcohol abuse, in remission: Secondary | ICD-10-CM | POA: Diagnosis present

## 2019-11-09 DIAGNOSIS — J189 Pneumonia, unspecified organism: Secondary | ICD-10-CM | POA: Diagnosis not present

## 2019-11-09 DIAGNOSIS — Z87442 Personal history of urinary calculi: Secondary | ICD-10-CM | POA: Diagnosis not present

## 2019-11-09 DIAGNOSIS — K219 Gastro-esophageal reflux disease without esophagitis: Secondary | ICD-10-CM | POA: Diagnosis present

## 2019-11-09 DIAGNOSIS — Z79899 Other long term (current) drug therapy: Secondary | ICD-10-CM

## 2019-11-09 DIAGNOSIS — R112 Nausea with vomiting, unspecified: Secondary | ICD-10-CM | POA: Diagnosis present

## 2019-11-09 DIAGNOSIS — G8921 Chronic pain due to trauma: Secondary | ICD-10-CM | POA: Diagnosis present

## 2019-11-09 DIAGNOSIS — E876 Hypokalemia: Secondary | ICD-10-CM | POA: Diagnosis present

## 2019-11-09 DIAGNOSIS — M549 Dorsalgia, unspecified: Secondary | ICD-10-CM | POA: Diagnosis present

## 2019-11-09 DIAGNOSIS — R197 Diarrhea, unspecified: Secondary | ICD-10-CM | POA: Diagnosis present

## 2019-11-09 DIAGNOSIS — R6520 Severe sepsis without septic shock: Secondary | ICD-10-CM | POA: Diagnosis present

## 2019-11-09 DIAGNOSIS — Z7901 Long term (current) use of anticoagulants: Secondary | ICD-10-CM

## 2019-11-09 DIAGNOSIS — R7881 Bacteremia: Secondary | ICD-10-CM | POA: Diagnosis not present

## 2019-11-09 DIAGNOSIS — Z86718 Personal history of other venous thrombosis and embolism: Secondary | ICD-10-CM

## 2019-11-09 DIAGNOSIS — A419 Sepsis, unspecified organism: Principal | ICD-10-CM | POA: Diagnosis present

## 2019-11-09 DIAGNOSIS — Y92009 Unspecified place in unspecified non-institutional (private) residence as the place of occurrence of the external cause: Secondary | ICD-10-CM

## 2019-11-09 DIAGNOSIS — K449 Diaphragmatic hernia without obstruction or gangrene: Secondary | ICD-10-CM | POA: Diagnosis present

## 2019-11-09 DIAGNOSIS — F17210 Nicotine dependence, cigarettes, uncomplicated: Secondary | ICD-10-CM | POA: Diagnosis present

## 2019-11-09 DIAGNOSIS — Z91128 Patient's intentional underdosing of medication regimen for other reason: Secondary | ICD-10-CM

## 2019-11-09 DIAGNOSIS — J690 Pneumonitis due to inhalation of food and vomit: Secondary | ICD-10-CM | POA: Diagnosis present

## 2019-11-09 DIAGNOSIS — Z86711 Personal history of pulmonary embolism: Secondary | ICD-10-CM | POA: Diagnosis present

## 2019-11-09 DIAGNOSIS — K3189 Other diseases of stomach and duodenum: Secondary | ICD-10-CM | POA: Diagnosis present

## 2019-11-09 DIAGNOSIS — Z9114 Patient's other noncompliance with medication regimen: Secondary | ICD-10-CM

## 2019-11-09 DIAGNOSIS — D573 Sickle-cell trait: Secondary | ICD-10-CM | POA: Diagnosis present

## 2019-11-09 DIAGNOSIS — F1110 Opioid abuse, uncomplicated: Secondary | ICD-10-CM | POA: Diagnosis present

## 2019-11-09 DIAGNOSIS — K529 Noninfective gastroenteritis and colitis, unspecified: Secondary | ICD-10-CM

## 2019-11-09 DIAGNOSIS — N179 Acute kidney failure, unspecified: Secondary | ICD-10-CM | POA: Diagnosis present

## 2019-11-09 DIAGNOSIS — T40496A Underdosing of other synthetic narcotics, initial encounter: Secondary | ICD-10-CM | POA: Diagnosis present

## 2019-11-09 DIAGNOSIS — D6859 Other primary thrombophilia: Secondary | ICD-10-CM | POA: Diagnosis present

## 2019-11-09 DIAGNOSIS — T507X6A Underdosing of analeptics and opioid receptor antagonists, initial encounter: Secondary | ICD-10-CM | POA: Diagnosis present

## 2019-11-09 LAB — URINALYSIS, ROUTINE W REFLEX MICROSCOPIC
Bilirubin Urine: NEGATIVE
Glucose, UA: NEGATIVE mg/dL
Ketones, ur: 5 mg/dL — AB
Leukocytes,Ua: NEGATIVE
Nitrite: NEGATIVE
Protein, ur: 100 mg/dL — AB
RBC / HPF: >50 RBC/hpf — ABNORMAL HIGH (ref 0–5)
Specific Gravity, Urine: 1.023 (ref 1.005–1.030)
pH: 5 (ref 5.0–8.0)

## 2019-11-09 LAB — COMPREHENSIVE METABOLIC PANEL
ALT: 20 U/L (ref 0–44)
AST: 24 U/L (ref 15–41)
Albumin: 4.8 g/dL (ref 3.5–5.0)
Alkaline Phosphatase: 61 U/L (ref 38–126)
Anion gap: 19 — ABNORMAL HIGH (ref 5–15)
BUN: 9 mg/dL (ref 6–20)
CO2: 17 mmol/L — ABNORMAL LOW (ref 22–32)
Calcium: 10.5 mg/dL — ABNORMAL HIGH (ref 8.9–10.3)
Chloride: 101 mmol/L (ref 98–111)
Creatinine, Ser: 1.31 mg/dL — ABNORMAL HIGH (ref 0.61–1.24)
GFR calc Af Amer: >60 mL/min (ref >60)
GFR calc non Af Amer: >60 mL/min (ref >60)
Glucose, Bld: 172 mg/dL — ABNORMAL HIGH (ref 70–99)
Potassium: 3.3 mmol/L — ABNORMAL LOW (ref 3.5–5.1)
Sodium: 137 mmol/L (ref 135–145)
Total Bilirubin: 0.4 mg/dL (ref 0.3–1.2)
Total Protein: 8.3 g/dL — ABNORMAL HIGH (ref 6.5–8.1)

## 2019-11-09 LAB — APTT: aPTT: 24 seconds (ref 24–36)

## 2019-11-09 LAB — ACETAMINOPHEN LEVEL: Acetaminophen (Tylenol), Serum: <10 ug/mL — ABNORMAL LOW (ref 10–30)

## 2019-11-09 LAB — RAPID URINE DRUG SCREEN, HOSP PERFORMED
Amphetamines: NOT DETECTED
Barbiturates: NOT DETECTED
Benzodiazepines: NOT DETECTED
Cocaine: NOT DETECTED
Opiates: NOT DETECTED
Tetrahydrocannabinol: NOT DETECTED

## 2019-11-09 LAB — POC SARS CORONAVIRUS 2 AG -  ED: SARS Coronavirus 2 Ag: NEGATIVE

## 2019-11-09 LAB — CBC WITH DIFFERENTIAL/PLATELET
Abs Immature Granulocytes: 0.38 10*3/uL — ABNORMAL HIGH (ref 0.00–0.07)
Basophils Absolute: 0.1 10*3/uL (ref 0.0–0.1)
Basophils Relative: 0 %
Eosinophils Absolute: 0 10*3/uL (ref 0.0–0.5)
Eosinophils Relative: 0 %
HCT: 49.7 % (ref 39.0–52.0)
Hemoglobin: 16.7 g/dL (ref 13.0–17.0)
Immature Granulocytes: 2 %
Lymphocytes Relative: 7 %
Lymphs Abs: 1.7 10*3/uL (ref 0.7–4.0)
MCH: 27.2 pg (ref 26.0–34.0)
MCHC: 33.6 g/dL (ref 30.0–36.0)
MCV: 80.9 fL (ref 80.0–100.0)
Monocytes Absolute: 1.6 10*3/uL — ABNORMAL HIGH (ref 0.1–1.0)
Monocytes Relative: 7 %
Neutro Abs: 19.7 10*3/uL — ABNORMAL HIGH (ref 1.7–7.7)
Neutrophils Relative %: 84 %
Platelets: 406 10*3/uL — ABNORMAL HIGH (ref 150–400)
RBC: 6.14 MIL/uL — ABNORMAL HIGH (ref 4.22–5.81)
RDW: 15.1 % (ref 11.5–15.5)
WBC: 23.4 10*3/uL — ABNORMAL HIGH (ref 4.0–10.5)
nRBC: 0.1 % (ref 0.0–0.2)

## 2019-11-09 LAB — PROTIME-INR
INR: 1.1 (ref 0.8–1.2)
Prothrombin Time: 13.9 seconds (ref 11.4–15.2)

## 2019-11-09 LAB — ETHANOL: Alcohol, Ethyl (B): <10 mg/dL (ref <10)

## 2019-11-09 LAB — LACTIC ACID, PLASMA
Lactic Acid, Venous: 8 mmol/L (ref 0.5–1.9)
Lactic Acid, Venous: 5.9 mmol/L (ref 0.5–1.9)

## 2019-11-09 LAB — CBG MONITORING, ED: Glucose-Capillary: 117 mg/dL — ABNORMAL HIGH (ref 70–99)

## 2019-11-09 LAB — LIPASE, BLOOD: Lipase: 51 U/L (ref 11–51)

## 2019-11-09 LAB — SALICYLATE LEVEL: Salicylate Lvl: <7 mg/dL — ABNORMAL LOW (ref 7.0–30.0)

## 2019-11-09 MED ORDER — SODIUM CHLORIDE 0.9 % IV BOLUS
1000.0000 mL | Freq: Once | INTRAVENOUS | Status: AC
  Administered 2019-11-09: 21:00:00 1000 mL via INTRAVENOUS

## 2019-11-09 MED ORDER — ENOXAPARIN SODIUM 40 MG/0.4ML ~~LOC~~ SOLN: 40.0000 mg | SUBCUTANEOUS | Status: DC

## 2019-11-09 MED ORDER — VANCOMYCIN HCL 1250 MG/250ML IV SOLN
1250.0000 mg | Freq: Once | INTRAVENOUS | Status: AC
  Administered 2019-11-09: 23:00:00 1250 mg via INTRAVENOUS
  Filled 2019-11-09: qty 250

## 2019-11-09 MED ORDER — PROMETHAZINE HCL 25 MG/ML IJ SOLN
25.0000 mg | Freq: Once | INTRAMUSCULAR | Status: AC
  Administered 2019-11-09: 21:00:00 25 mg via INTRAVENOUS
  Filled 2019-11-09: qty 1

## 2019-11-09 MED ORDER — SODIUM CHLORIDE 0.9 % IV SOLN
2.0000 g | Freq: Once | INTRAVENOUS | Status: AC
  Administered 2019-11-09: 21:00:00 2 g via INTRAVENOUS
  Filled 2019-11-09: qty 2

## 2019-11-09 MED ORDER — ACETAMINOPHEN 325 MG PO TABS
650.0000 mg | ORAL_TABLET | Freq: Four times a day (QID) | ORAL | Status: DC | PRN
  Administered 2019-11-10 – 2019-11-14 (×6): 650 mg via ORAL
  Filled 2019-11-09 (×6): qty 2

## 2019-11-09 MED ORDER — SODIUM CHLORIDE 0.9 % IV BOLUS
1000.0000 mL | Freq: Once | INTRAVENOUS | Status: AC
  Administered 2019-11-09: 19:00:00 1000 mL via INTRAVENOUS

## 2019-11-09 MED ORDER — ACETAMINOPHEN 650 MG RE SUPP: 650.0000 mg | Freq: Four times a day (QID) | RECTAL | Status: DC | PRN

## 2019-11-09 MED ORDER — FENTANYL CITRATE (PF) 100 MCG/2ML IJ SOLN
25.0000 ug | Freq: Once | INTRAMUSCULAR | Status: AC
  Administered 2019-11-09: 25 ug via INTRAVENOUS
  Filled 2019-11-09: qty 2

## 2019-11-09 MED ORDER — METOCLOPRAMIDE HCL 5 MG/ML IJ SOLN
10.0000 mg | Freq: Once | INTRAMUSCULAR | Status: AC
  Administered 2019-11-09: 10 mg via INTRAVENOUS
  Filled 2019-11-09: qty 2

## 2019-11-09 MED ORDER — IOHEXOL 300 MG/ML  SOLN
100.0000 mL | Freq: Once | INTRAMUSCULAR | Status: AC | PRN
  Administered 2019-11-09: 23:00:00 100 mL via INTRAVENOUS

## 2019-11-09 MED ORDER — METRONIDAZOLE IN NACL 5-0.79 MG/ML-% IV SOLN
500.0000 mg | Freq: Once | INTRAVENOUS | Status: AC
  Administered 2019-11-09: 500 mg via INTRAVENOUS
  Filled 2019-11-09: qty 100

## 2019-11-09 MED ORDER — VANCOMYCIN HCL IN DEXTROSE 1-5 GM/200ML-% IV SOLN: 1000.0000 mg | Freq: Once | INTRAVENOUS | Status: DC

## 2019-11-09 MED ORDER — POTASSIUM CHLORIDE IN NACL 20-0.9 MEQ/L-% IV SOLN
INTRAVENOUS | Status: AC
  Filled 2019-11-09: qty 1000

## 2019-11-09 MED ORDER — ONDANSETRON HCL 4 MG/2ML IJ SOLN
4.0000 mg | Freq: Once | INTRAMUSCULAR | Status: AC
  Administered 2019-11-09: 19:00:00 4 mg via INTRAVENOUS
  Filled 2019-11-09: qty 2

## 2019-11-09 NOTE — ED Triage Notes (Signed)
[facility] reports pt had been lying in the floor in his bedroom, before walking out and asking family to call 911. [facility] described his as semi unresponsive or uncooperative as it was unclear which applied. They reported pin point pupils, known hx of drug use. During the trip here, he reported feeling week, and having some N, D. Pt maintained 98% on room air. Hypotensive at 96/60 and given ~ 250 ml NS. cbg 131. 114/78 hr 58 rr 16

## 2019-11-09 NOTE — ED Provider Notes (Signed)
39 year old with acute onset of weakness, confusion, N/V/D and epigastric abdominal pain. Has had intractable vomiting here. Vitals are reassuring. WBC 23. Initial lactate was 8. He has an AKI as well. Work up thus far has been unrevealing as far as source although sounds GI related. CXR shows possible pneumonia vs aspiration. Fluids and broad spectrum abx given. At shift change CT head and abdomen/pelvis is pending.   Imaging is unremarkable. Rapid COVID is negative. Will obtain COVID PCR. ETOH and drug screen are clean. Lactate is improving after fluids. Vomiting has been somewhat intractable here. Updated pt on plan. He denies recent abx but will add on C.diff testing. He cannot quantify diarrhea and just states he's had "a lot". Discussed plan with his mother as well.  Discussed with Dr. Maudie Mercury with [facility] who will admit.      Recardo Evangelist, PA-C 11/10/19 [place] [place]    Lajean Saver, MD 11/10/19 [phone]

## 2019-11-09 NOTE — ED Notes (Addendum)
Attempted to get repeat ekg several times on this patient, patient will not keep on his ekg wires. Repeatedly rolls around in bed, removing wires. unable to get ekg at this time.

## 2019-11-09 NOTE — ED Notes (Addendum)
Date and time results received: 11/09/19  (use smartphrase ".now" to insert current time)  Test: lactic Critical Value: 8.0  Name of Provider Notified: PA, Sophia C.  Orders Received? Or Actions Taken?: Orders Received - See Orders for details

## 2019-11-09 NOTE — ED Provider Notes (Signed)
[facility] EMERGENCY DEPARTMENT Provider Note   CSN: [id number] Arrival date & time: 11/09/19  1738     History Chief Complaint  Patient presents with  . semi unresponsive    Jason [name] is a 39 y.o. male presenting for evaluation of confusion.  Level 5 caveat due to altered mental status.  Patient states he started feeling badly all of a sudden today.  He cannot tell me when.  Patient states he became weak, confused, nauseous, and had abdominal pain.  He also reports he has been having diarrhea.  He denies known fevers, but states he feels very cold.  He denies sick contacts.  He denies headache, cough, chest pain, shortness of breath, urinary symptoms.  He states he takes oxycodone for back pain, takes it once every 4 hours.  He states he has not taken any more than prescribed.  He states he is supposed to take Eliquis daily, but takes it every few days.  He has not had it in the past several days.   Additional history obtained from chart review.  Patient with a history of alcohol abuse in remission, opioid use disorder, history of kidney stones, chronic back pain, GERD, cocaine use, pulmonary emboli (?septic)    HPI     Past Medical History:  Diagnosis Date  . Anxiety   . Back injury   . Cervical disc disease   . Chronic back pain   . Chronic low back pain 03/29/2016  . Family history of adverse reaction to anesthesia   . GERD (gastroesophageal reflux disease)   . History of kidney stones    per mother- "there was a diagnoses at some point"  . Insomnia   . Lumbar disc disease   . MIGRAINE, COMMON 09/23/2010   Qualifier: Diagnosis of  By: Jenny Reichmann MD, Hunt Oris   . Pneumonia   . Sickle cell trait [facility])     Patient Active Problem List   Diagnosis Date Noted  . 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 Uncomplicated opioid dependence ([place]) 01/08/2019  . Cervicalgia 12/13/2018  . Acute low back pain with bilateral sciatica 12/13/2018  . Chronic pain  due to trauma 12/13/2018  . Cervical radiculopathy 12/13/2018  . Cocaine abuse, episodic ([place]) 12/11/2018  . Thrombocytosis ([place]) 12/05/2018  . Lactic acidosis 12/04/2018  . Pulmonary embolism ([place]) 11/27/2018  . S/P lumbar laminectomy 08/29/2017  . Spinal stenosis of lumbar region 08/28/2017    Class: Chronic  . Status post lumbar microdiscectomy 08/28/2017  . 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 Gross hematuria 04/01/2016  . Depression 04/01/2016  . Chronic low back pain 03/29/2016  . Cough 08/05/2015  . Encounter for well adult exam with abnormal findings 02/17/2015  . Lumbar disc disease   . Cervical disc disease   . GERD (gastroesophageal reflux disease)   . Anxiety   . Insomnia   . CONSTIPATION 10/05/2010  . MIGRAINE, COMMON 09/23/2010  . Abdominal pain, other specified site 09/23/2010    Past Surgical History:  Procedure Laterality Date  . BACK SURGERY    . LUMBAR LAMINECTOMY/DECOMPRESSION MICRODISCECTOMY Bilateral 08/28/2017   Procedure: BILATERAL PARTIAL HEMILAMINECTOMIES L4-5 WITH MICRODISCECTOMY;  Surgeon: Jessy Oto, MD;  Location: [facility];  Service: Orthopedics;  Laterality: Bilateral;  . NO PAST SURGERIES         Family History  Problem Relation Age of Onset  . Congestive Heart Failure Father   .  Hypertension Mother   . Hyperlipidemia Mother   . Stroke Mother     Social History   Tobacco Use  . Smoking status: Current Every Day Smoker    Packs/day: 1.00    Years: 24.00    Pack years: 24.00    Types: Cigarettes  . Smokeless tobacco: Never Used  . Tobacco comment: notified primary care provider and CCM RNCM  Substance Use Topics  . Alcohol use: Yes    Comment: former use - heavy drinker 8 years ago  . Drug use: No    Home Medications Prior to Admission medications   Medication Sig Start Date End Date Taking? Authorizing Provider  Buprenorphine HCl-Naloxone HCl 8-2 MG  FILM SMARTSIG:1 Strip(s) Sublingual Every 8 Hours 10/10/19   [provider]  diazepam (VALIUM) 5 MG tablet Take 1 tablet (5 mg total) by mouth 2 (two) times daily. 08/24/19   Isla Pence, MD  gabapentin (NEURONTIN) 300 MG capsule Take 300 mg by mouth 3 (three) times daily.    [provider]  meclizine (ANTIVERT) 25 MG tablet Take 1 tablet (25 mg total) by mouth 3 (three) times daily as needed for dizziness. 08/24/19   Isla Pence, MD  ondansetron (ZOFRAN ODT) 4 MG disintegrating tablet Take 1 tablet (4 mg total) by mouth every 8 (eight) hours as needed. 08/24/19   Isla Pence, MD  oxycodone (ROXICODONE) 30 MG immediate release tablet Take 30 mg by mouth 3 (three) times daily.    [provider]  rivaroxaban (XARELTO) 20 MG TABS tablet Take 1 tablet (20 mg total) by mouth daily with supper. 04/11/19   Minette Brine, FNP    Allergies    Morphine and related and Other  Review of Systems   Review of Systems  Unable to perform ROS: Mental status change  Constitutional: Positive for chills.  Gastrointestinal: Positive for abdominal pain, diarrhea, nausea and vomiting.  Psychiatric/Behavioral: Positive for confusion.    Physical Exam Updated Vital Signs BP (!) 112/59 (BP Location: Right Arm)   Pulse 60   Temp 98 F (36.7 C) (Oral)   Resp (!) 27   SpO2 100%   Physical Exam Vitals and nursing note reviewed.  Constitutional:      Appearance: He is well-developed. He is ill-appearing.     Comments: Appears ill  HENT:     Head: Normocephalic and atraumatic.  Eyes:     Extraocular Movements: Extraocular movements intact.     Conjunctiva/sclera: Conjunctivae normal.     Pupils: Pupils are equal, round, and reactive to light.  Cardiovascular:     Rate and Rhythm: Normal rate and regular rhythm.     Pulses: Normal pulses.  Pulmonary:     Effort: Pulmonary effort is normal. No respiratory distress.     Breath sounds: Normal breath sounds. No wheezing.       Comments: Tachypneic.  Clear lung sounds.  Speaking in full sentences Abdominal:     General: There is no distension.     Palpations: Abdomen is soft. There is no mass.     Tenderness: There is abdominal tenderness. There is no guarding or rebound.     Comments: Tenderness palpation of epigastric abdomen.  No rigidity, guarding, distention.  Negative rebound.  No peritonitis.  Musculoskeletal:        General: Normal range of motion.     Cervical back: Normal range of motion and neck supple.     Right lower leg: No edema.     Left  lower leg: No edema.  Skin:    General: Skin is warm and dry.     Capillary Refill: Capillary refill takes less than 2 seconds.  Neurological:     Mental Status: He is alert.     Comments: Alert to person, place, event.  Very sleepy, hard to arouse     ED Results / Procedures / Treatments   Labs (all labs ordered are listed, but only abnormal results are displayed) Labs Reviewed  CBC WITH DIFFERENTIAL/PLATELET - Abnormal; Notable for the following components:      Result Value   WBC 23.4 (*)    RBC 6.14 (*)    Platelets 406 (*)    Neutro Abs 19.7 (*)    Monocytes Absolute 1.6 (*)    Abs Immature Granulocytes 0.38 (*)    All other components within normal limits  COMPREHENSIVE METABOLIC PANEL - Abnormal; Notable for the following components:   Potassium 3.3 (*)    CO2 17 (*)    Glucose, Bld 172 (*)    Creatinine, Ser 1.31 (*)    Calcium 10.5 (*)    Total Protein 8.3 (*)    Anion gap 19 (*)    All other components within normal limits  LACTIC ACID, PLASMA - Abnormal; Notable for the following components:   Lactic Acid, Venous 8.0 (*)    All other components within normal limits  LACTIC ACID, PLASMA - Abnormal; Notable for the following components:   Lactic Acid, Venous 5.9 (*)    All other components within normal limits  SALICYLATE LEVEL - Abnormal; Notable for the following components:   Salicylate Lvl [id number] (*)    All other components  within normal limits  ACETAMINOPHEN LEVEL - Abnormal; Notable for the following components:   Acetaminophen (Tylenol), Serum <10 (*)    All other components within normal limits  CBG MONITORING, ED - Abnormal; Notable for the following components:   Glucose-Capillary 117 (*)    All other components within normal limits  CULTURE, BLOOD (ROUTINE X 2)  CULTURE, BLOOD (ROUTINE X 2)  URINE CULTURE  LIPASE, BLOOD  ETHANOL  RAPID URINE DRUG SCREEN, HOSP PERFORMED  URINALYSIS, ROUTINE W REFLEX MICROSCOPIC  APTT  PROTIME-INR  POC SARS CORONAVIRUS 2 AG -  ED    EKG EKG Interpretation  Date/Time:  Saturday November 09 2019 17:44:31 EST Ventricular Rate:  64 PR Interval:    QRS Duration: 71 QT Interval:  434 QTC Calculation: 448 R Axis:   74 Text Interpretation: Sinus rhythm Consider right atrial enlargement Left ventricular hypertrophy ST elev, probable normal early repol pattern Confirmed by Davonna Belling [phone]) on 11/09/2019 8:11:32 PM   Radiology No results found.    Procedures .Critical Care Performed by: Franchot Heidelberg, PA-C Authorized by: Franchot Heidelberg, PA-C   Critical care provider statement:    Critical care time (minutes):  45   Critical care time was exclusive of:  Teaching time and separately billable procedures and treating other patients   Critical care was necessary to treat or prevent imminent or life-threatening deterioration of the following conditions:  Sepsis   Critical care was time spent personally by me on the following activities:  Blood draw for specimens, development of treatment plan with patient or surrogate, evaluation of patient's response to treatment, examination of patient, obtaining history from patient or surrogate, ordering and performing treatments and interventions, ordering and review of laboratory studies, ordering and review of radiographic studies, pulse oximetry, re-evaluation of patient's condition and review of  old charts   I  assumed direction of critical care for this patient from another provider in my specialty: no   Comments:     Pt septic with elevated lactic, WBC, and tachypneia. requiring IV abx, fluids, and admission.  Ultrasound ED Peripheral IV (Provider)  Date/Time: 11/09/2019 8:51 PM Performed by: Franchot Heidelberg, PA-C Authorized by: Franchot Heidelberg, PA-C   Procedure details:    Indications: hydration, multiple failed IV attempts and poor IV access     Skin Prep: chlorhexidine gluconate     Location:  Right AC   Angiocath:  20 G   Bedside Ultrasound Guided: Yes     Images: archived     Patient tolerated procedure without complications: Yes     Dressing applied: No     (including critical care time)  Medications Ordered in ED Medications  ceFEPIme (MAXIPIME) 2 g in sodium chloride 0.9 % 100 mL IVPB (has no administration in time range)  metroNIDAZOLE (FLAGYL) IVPB 500 mg (has no administration in time range)  vancomycin (VANCOREADY) IVPB 1250 mg/250 mL (has no administration in time range)  sodium chloride 0.9 % bolus 1,000 mL (has no administration in time range)  ondansetron (ZOFRAN) injection 4 mg (4 mg Intravenous Given 11/09/19 1834)  sodium chloride 0.9 % bolus 1,000 mL (1,000 mLs Intravenous New Bag/Given 11/09/19 1835)  metoCLOPramide (REGLAN) injection 10 mg (10 mg Intravenous Given 11/09/19 2011)    ED Course  I have reviewed the triage vital signs and the nursing notes.  Pertinent labs & imaging results that were available during my care of the patient were reviewed by me and considered in my medical decision making (see chart for details).    MDM Rules/Calculators/A&P                      Patient presenting for evaluation nausea, vomiting, domino pain, confusion.  Physical exam shows patient who appears ill.  He is alert to person, place, time, event, however very lethargic.  Consider drug use versus infection versus dehydration versus metabolic abnormality.  Will obtain  labs and reassess.  Difficulty obtaining IV access and labs resulting in delay of care.  Initial lactic elevated at 8.  White count elevated at 23.4.  As such, code sepsis was called and antibiotics started.  CMP shows mild acidosis with bicarb of 17 and a gap of 19.  Patient is not significantly hyperglycemic.  Creatinine mildly elevated at 1.3, likely due to dehydration.  Fluid boluses given. Ethanol negative.  Lipase normal.  Patient with continued vomiting despite Zofran and Reglan.  Will give Phenergan.  Chest x-ray with left lung abnormality.  Per radiology, could be pneumonia versus aspiration.  CT head and CT abdomen pelvis pending.  Patient signed out to Raquel James, PA-C for f/u on imaging and admission to the hospital.   Final Clinical Impression(s) / ED Diagnoses Final diagnoses:  None    Rx / DC Orders ED Discharge Orders    None       Erick Alley 11/09/19 2102    Davonna Belling, MD 11/10/19 [phone]

## 2019-11-10 DIAGNOSIS — R197 Diarrhea, unspecified: Secondary | ICD-10-CM

## 2019-11-10 DIAGNOSIS — Z86711 Personal history of pulmonary embolism: Secondary | ICD-10-CM

## 2019-11-10 DIAGNOSIS — R112 Nausea with vomiting, unspecified: Secondary | ICD-10-CM

## 2019-11-10 DIAGNOSIS — N179 Acute kidney failure, unspecified: Secondary | ICD-10-CM | POA: Diagnosis present

## 2019-11-10 DIAGNOSIS — E872 Acidosis: Secondary | ICD-10-CM

## 2019-11-10 DIAGNOSIS — J189 Pneumonia, unspecified organism: Secondary | ICD-10-CM | POA: Diagnosis present

## 2019-11-10 LAB — COMPREHENSIVE METABOLIC PANEL
ALT: 18 U/L (ref 0–44)
AST: 26 U/L (ref 15–41)
Albumin: 4.3 g/dL (ref 3.5–5.0)
Alkaline Phosphatase: 52 U/L (ref 38–126)
Anion gap: 17 — ABNORMAL HIGH (ref 5–15)
BUN: 11 mg/dL (ref 6–20)
CO2: 20 mmol/L — ABNORMAL LOW (ref 22–32)
Calcium: 9.8 mg/dL (ref 8.9–10.3)
Chloride: 106 mmol/L (ref 98–111)
Creatinine, Ser: 1.15 mg/dL (ref 0.61–1.24)
GFR calc Af Amer: >60 mL/min (ref >60)
GFR calc non Af Amer: >60 mL/min (ref >60)
Glucose, Bld: 121 mg/dL — ABNORMAL HIGH (ref 70–99)
Potassium: 4.4 mmol/L (ref 3.5–5.1)
Sodium: 143 mmol/L (ref 135–145)
Total Bilirubin: 0.4 mg/dL (ref 0.3–1.2)
Total Protein: 7.6 g/dL (ref 6.5–8.1)

## 2019-11-10 LAB — CBC
HCT: 46 % (ref 39.0–52.0)
Hemoglobin: 15.5 g/dL (ref 13.0–17.0)
MCH: 27.2 pg (ref 26.0–34.0)
MCHC: 33.7 g/dL (ref 30.0–36.0)
MCV: 80.7 fL (ref 80.0–100.0)
Platelets: 351 10*3/uL (ref 150–400)
RBC: 5.7 MIL/uL (ref 4.22–5.81)
RDW: 14.8 % (ref 11.5–15.5)
WBC: 21.5 10*3/uL — ABNORMAL HIGH (ref 4.0–10.5)
nRBC: 0 % (ref 0.0–0.2)

## 2019-11-10 LAB — SARS CORONAVIRUS 2 (TAT 6-24 HRS): SARS Coronavirus 2: NEGATIVE

## 2019-11-10 LAB — STREP PNEUMONIAE URINARY ANTIGEN: Strep Pneumo Urinary Antigen: NEGATIVE

## 2019-11-10 MED ORDER — ZOLPIDEM TARTRATE 5 MG PO TABS
5.0000 mg | ORAL_TABLET | Freq: Once | ORAL | Status: AC
  Administered 2019-11-11: 01:00:00 5 mg via ORAL
  Filled 2019-11-10: qty 1

## 2019-11-10 MED ORDER — VANCOMYCIN HCL 750 MG/150ML IV SOLN
750.0000 mg | Freq: Two times a day (BID) | INTRAVENOUS | Status: DC
  Filled 2019-11-10: qty 150

## 2019-11-10 MED ORDER — METRONIDAZOLE IN NACL 5-0.79 MG/ML-% IV SOLN
500.0000 mg | Freq: Three times a day (TID) | INTRAVENOUS | Status: DC
  Administered 2019-11-10 – 2019-11-12 (×7): 500 mg via INTRAVENOUS
  Filled 2019-11-10 (×7): qty 100

## 2019-11-10 MED ORDER — GABAPENTIN 300 MG PO CAPS
300.0000 mg | ORAL_CAPSULE | Freq: Three times a day (TID) | ORAL | Status: DC
  Administered 2019-11-10 – 2019-11-14 (×12): 300 mg via ORAL
  Filled 2019-11-10 (×12): qty 1

## 2019-11-10 MED ORDER — TRAMADOL HCL 50 MG PO TABS
50.0000 mg | ORAL_TABLET | Freq: Four times a day (QID) | ORAL | Status: AC | PRN
  Administered 2019-11-10 (×2): 50 mg via ORAL
  Filled 2019-11-10 (×2): qty 1

## 2019-11-10 MED ORDER — FENTANYL CITRATE (PF) 100 MCG/2ML IJ SOLN: 25.0000 ug | INTRAMUSCULAR | Status: DC | PRN

## 2019-11-10 MED ORDER — SODIUM CHLORIDE 0.9 % IV SOLN
2.0000 g | Freq: Three times a day (TID) | INTRAVENOUS | Status: DC
  Administered 2019-11-11 – 2019-11-12 (×4): 2 g via INTRAVENOUS
  Filled 2019-11-10 (×6): qty 2

## 2019-11-10 MED ORDER — RIVAROXABAN 20 MG PO TABS
20.0000 mg | ORAL_TABLET | Freq: Every day | ORAL | Status: DC
  Administered 2019-11-10 – 2019-11-11 (×2): 20 mg via ORAL
  Filled 2019-11-10 (×2): qty 1

## 2019-11-10 MED ORDER — FENTANYL CITRATE (PF) 100 MCG/2ML IJ SOLN
25.0000 ug | INTRAMUSCULAR | Status: AC | PRN
  Administered 2019-11-10: 21:00:00 25 ug via INTRAVENOUS
  Filled 2019-11-10: qty 2

## 2019-11-10 MED ORDER — ONDANSETRON HCL 4 MG/2ML IJ SOLN
4.0000 mg | Freq: Four times a day (QID) | INTRAMUSCULAR | Status: DC | PRN
  Administered 2019-11-10 – 2019-11-11 (×4): 4 mg via INTRAVENOUS
  Filled 2019-11-10 (×4): qty 2

## 2019-11-10 NOTE — ED Notes (Signed)
Pt care endorsed from [facility]. Pt resting in bed. No distress noted. Will continue to monitor.

## 2019-11-10 NOTE — ED Notes (Signed)
Pt resting in bed. No distress noted. Will continue to monitor. No distress noted.

## 2019-11-10 NOTE — Progress Notes (Signed)
Patient arrived to 6n16 from ED via stretcher, AOx4, VSS, no complaints of pain. Oriented patient to room, use of call bell, call light, bed controls. Will continue to monitor patient.

## 2019-11-10 NOTE — Progress Notes (Signed)
PROGRESS NOTE    Jason [name]  [id number] DOB: November 19, 1980 DOA: 11/09/2019 PCP: Shelby Mattocks, PA-C    Brief Narrative:  39 y.o. male,  w gerd, chronic lower back pain, migraines, sickle cell trait,  h/o pulmonary embolism 11/27/18, h/o renal infarcts, presents with c/o n/v, abd pain (lower), diarrhea starting today.  Pt was on the floor in the house due to persistent n/v.  Slight cough, mostly dry, sometimes yellow, for a couple of weeks.  CT ab/pelvis with contrast negative. CXR concerning for pneumonia versus aspiration.   1/24: He continues to have abdominal pain with nausea although reports overall improvement with IVF and abx as below.  Assessment & Plan:   Active Problems:   Lactic acidosis   Sepsis ([place])   Community acquired pneumonia   Nausea & vomiting   Diarrhea   History of pulmonary embolism   Sepsis  Blood culture x2 -- Pending Urine culture -- Pending Stool studies -- Pending vanco iv, cefepime iv, pharmacy to dose Flagyl as below Likely secondary to pneumonia  Pneumonia Blood culture as above Urine strep antigen negative , urine legionella antigen pending Abx as above. Will deescalate as cultures result   Acute lower UTI Urine culture Abx as above  N/v Zofran 4mg  iv q6h prn   Abdominal pain Severe abdominal pain this morning that he reports is unchanged from previous. CT ab/pelvis W/ contrast negative other than mild bladder wall thickening Giving one time dose of 25 mcg of IV fentanyl Tramadol also available as needed  Diarrhea Stool studies pending Flagyl 500mg  iv tid  Hypokalemia Resolved Check cmp in am  AKI He received IVF with slight improvement in creatinine down to 1.15 from 1.31 yesterday Check cmp in am  H/o PE Cont Xarelto  Chronic back pain Pt states that he is taking oxycodone and his mother states that he has been on oxycodone but no oxycodone recently on review Unclear what he is taking,  Pharmacy to verify   DVT prophylaxis: Xarelto Code Status:Full Family Communication:Spoke with patient at bedside. No family available at this time Disposition Plan:Anticipate discharge home when medically stable from sepsis and able to tolerate PO  Consultants:   None  Procedures:   None   Antimicrobials:  Vancomycin, cefepime--  1/23-   Flagyl 1/24-   Subjective: Patient is lying on his left side in moderate distress secondary to discomfort from pain and nausea. He does report feeling some better this morning since receiving IV abx and IVF. He admits to continued watery diarrhea and mild SOB. He denies any fever, chills, CP, swelling. ROS is otherwise negative.   Objective: Vitals:   11/10/19 0035 11/10/19 0100 11/10/19 0329 11/10/19 0521  BP: (!) 146/85 132/89 130/88 130/82  Pulse: 77  78 72  Resp: 20 20 18 18   Temp:      TempSrc:      SpO2: 95%  96% 98%  Weight:      Height:        Intake/Output Summary (Last 24 hours) at 11/10/2019 1023 Last data filed at 11/10/2019 0817 Gross per 24 hour  Intake 2731 ml  Output 100 ml  Net 2631 ml   Filed Weights   11/09/19 2050  Weight: 68 kg    Examination:  General exam: Appears calm although uncomfortable. Moderate distress due to nausea and abdominal pain  Respiratory system: Clear to auscultation. Respiratory effort normal. Cardiovascular system: S1 & S2 heard, RRR. No JVD, murmurs, rubs, gallops or clicks. No pedal edema.  Gastrointestinal system: Abdomen is nondistended, soft and minimally tender. No organomegaly or masses felt. Normal bowel sounds heard. Central nervous system: Alert and oriented. No focal neurological deficits. Extremities: Symmetric 5 x 5 power. Skin: No rashes, lesions or ulcers Psychiatry: Judgement and insight appear normal. Mood & affect appropriate.     Data Reviewed: I have personally reviewed following labs and imaging studies  CBC: Recent Labs  Lab 11/09/19 1946  11/10/19 0242  WBC 23.4* 21.5*  NEUTROABS 19.7*  --   HGB 16.7 15.5  HCT 49.7 46.0  MCV 80.9 80.7  PLT 406* [id number]   Basic Metabolic Panel: Recent Labs  Lab 11/09/19 1946 11/10/19 0242  NA 137 143  K 3.3* 4.4  CL 101 106  CO2 17* 20*  GLUCOSE 172* 121*  BUN 9 11  CREATININE 1.31* 1.15  CALCIUM 10.5* 9.8   GFR: Estimated Creatinine Clearance: 83.8 mL/min (by C-G formula based on SCr of 1.15 mg/dL). Liver Function Tests: Recent Labs  Lab 11/09/19 1946 11/10/19 0242  AST 24 26  ALT 20 18  ALKPHOS 61 52  BILITOT 0.4 0.4  PROT 8.3* 7.6  ALBUMIN 4.8 4.3   Recent Labs  Lab 11/09/19 1946  LIPASE 51   No results for input(s): AMMONIA in the last 168 hours. Coagulation Profile: Recent Labs  Lab 11/09/19 2303  INR 1.1   Cardiac Enzymes: No results for input(s): CKTOTAL, CKMB, CKMBINDEX, TROPONINI in the last 168 hours. BNP (last 3 results) No results for input(s): PROBNP in the last 8760 hours. HbA1C: No results for input(s): HGBA1C in the last 72 hours. CBG: Recent Labs  Lab 11/09/19 1745  GLUCAP 117*   Lipid Profile: No results for input(s): CHOL, HDL, LDLCALC, TRIG, CHOLHDL, LDLDIRECT in the last 72 hours. Thyroid Function Tests: No results for input(s): TSH, T4TOTAL, FREET4, T3FREE, THYROIDAB in the last 72 hours. Anemia Panel: No results for input(s): VITAMINB12, FOLATE, FERRITIN, TIBC, IRON, RETICCTPCT in the last 72 hours. Urine analysis:    Component Value Date/Time   COLORURINE AMBER (A) 11/09/2019 1808   APPEARANCEUR CLOUDY (A) 11/09/2019 1808   APPEARANCEUR Hazy 01/13/2014 1418   LABSPEC 1.023 11/09/2019 1808   LABSPEC 1.028 01/13/2014 1418   PHURINE 5.0 11/09/2019 1808   GLUCOSEU NEGATIVE 11/09/2019 1808   GLUCOSEU NEGATIVE 03/23/2016 1215   HGBUR MODERATE (A) 11/09/2019 1808   BILIRUBINUR NEGATIVE 11/09/2019 1808   BILIRUBINUR negative 12/11/2018 1645   BILIRUBINUR Negative 01/13/2014 1418   KETONESUR 5 (A) 11/09/2019 1808   PROTEINUR  100 (A) 11/09/2019 1808   UROBILINOGEN 0.2 12/11/2018 1645   UROBILINOGEN 0.2 03/23/2016 1215   NITRITE NEGATIVE 11/09/2019 1808   LEUKOCYTESUR NEGATIVE 11/09/2019 1808   LEUKOCYTESUR Negative 01/13/2014 1418   Sepsis Labs: @LABRCNTIP (procalcitonin:4,lacticidven:4)  ) Recent Results (from the past 240 hour(s))  SARS CORONAVIRUS 2 (TAT 6-24 HRS)     Status: None   Collection Time: 11/10/19 12:31 AM  Result Value Ref Range Status   SARS Coronavirus 2 NEGATIVE NEGATIVE Final    Comment: (NOTE) SARS-CoV-2 target nucleic acids are NOT DETECTED. The SARS-CoV-2 RNA is generally detectable in upper and lower respiratory specimens during the acute phase of infection. Negative results do not preclude SARS-CoV-2 infection, do not rule out co-infections with other pathogens, and should not be used as the sole basis for treatment or other patient management decisions. Negative results must be combined with clinical observations, patient history, and epidemiological information. The expected result is Negative. Fact Sheet for Patients: SugarRoll.be Fact Sheet  for Healthcare Providers: https://www.woods-mathews.com/ This test is not yet approved or cleared by the [place] and  has been authorized for detection and/or diagnosis of SARS-CoV-2 by FDA under an Emergency Use Authorization (EUA). This EUA will remain  in effect (meaning this test can be used) for the duration of the COVID-19 declaration under Section 56 4(b)(1) of the Act, 21 U.S.C. section 360bbb-3(b)(1), unless the authorization is terminated or revoked sooner. Performed at [facility], [place] [address], [place], [place] [address]          Radiology Studies: CT Head Wo Contrast  Result Date: 11/09/2019 CLINICAL DATA:  Encephalopathy. EXAM: CT HEAD WITHOUT CONTRAST TECHNIQUE: Contiguous axial images were obtained from the base of the skull through the vertex without  intravenous contrast. COMPARISON:  Head CT 08/24/2019 FINDINGS: Brain: Mild motion artifact through the skull base. No intracranial hemorrhage, mass effect, or midline shift. No hydrocephalus. The basilar cisterns are patent. No evidence of territorial infarct or acute ischemia. No extra-axial or intracranial fluid collection. Vascular: No hyperdense vessel or unexpected calcification. Skull: No fracture or focal lesion. Mild motion artifact through the skull base. Sinuses/Orbits: No acute finding allowing for motion. Other: None. IMPRESSION: Negative head CT allowing for mild skull base motion artifact. Electronically Signed   By: Keith Rake M.D.   On: 11/09/2019 23:17   CT ABDOMEN PELVIS W CONTRAST  Result Date: 11/09/2019 CLINICAL DATA:  Nausea and vomiting abdominal pain.  Sepsis. EXAM: CT ABDOMEN AND PELVIS WITH CONTRAST TECHNIQUE: Multidetector CT imaging of the abdomen and pelvis was performed using the standard protocol following bolus administration of intravenous contrast. CONTRAST:  167mL OMNIPAQUE IOHEXOL 300 MG/ML  SOLN COMPARISON:  12/03/2018 FINDINGS: Lower chest: The lung bases are clear. The heart size is normal. Hepatobiliary: The liver is normal. Normal gallbladder.There is no biliary ductal dilation. Pancreas: Normal contours without ductal dilatation. No peripancreatic fluid collection. Spleen: No splenic laceration or hematoma. Adrenals/Urinary Tract: --Adrenal glands: No adrenal hemorrhage. --Right kidney/ureter: No hydronephrosis or perinephric hematoma. --Left kidney/ureter: No hydronephrosis or perinephric hematoma. --Urinary bladder: There appears to be some mild bladder wall thickening. Stomach/Bowel: --Stomach/Duodenum: No hiatal hernia or other gastric abnormality. Normal duodenal course and caliber. --Small bowel: No dilatation or inflammation. --Colon: There is liquid stool at the level of the rectum. The colon is relatively decompressed. --Appendix: Not visualized. No right  lower quadrant inflammation or free fluid. Vascular/Lymphatic: Normal course and caliber of the major abdominal vessels. There is suggestion of a circumaortic left renal vein, a normal variant. --No retroperitoneal lymphadenopathy. --No mesenteric lymphadenopathy. --there are few mildly enlarged inguinal lymph nodes. Reproductive: Unremarkable Other: No ascites or free air. The abdominal wall is normal. Musculoskeletal. No acute displaced fractures. IMPRESSION: 1. Possible mild bladder wall thickening. Correlation with urinalysis is recommended. 2. Otherwise, no acute intra-abdominal abnormality detected. Electronically Signed   By: Constance Holster M.D.   On: 11/09/2019 23:15   DG Chest Port 1 View  Result Date: 11/09/2019 CLINICAL DATA:  Fever EXAM: PORTABLE CHEST 1 VIEW COMPARISON:  Radiograph 12/03/2018, CT 12/26/2018 FINDINGS: Some streaky opacities in the lung bases likely reflect areas of atelectasis. There is a left perihilar focus of consolidation with air bronchograms. No pneumothorax. No effusion. The cardiomediastinal contours are unremarkable. No acute osseous or soft tissue abnormality. IMPRESSION: Left perihilar focus of consolidation with air bronchograms could reflect pneumonia or sequela of aspiration. Electronically Signed   By: Lovena Le M.D.   On: 11/09/2019 20:47  Scheduled Meds:  rivaroxaban  20 mg Oral Daily   Continuous Infusions:  [START ON 11/11/2019] ceFEPime (MAXIPIME) IV     metronidazole Stopped (11/10/19 0817)   [START ON 11/11/2019] vancomycin       LOS: 1 day    Arlan Organ, DO [facility]

## 2019-11-10 NOTE — ED Notes (Signed)
IVF infusing per MAR. No distress noted. Will continue to monitor. Pt denies new or worsening complaints.

## 2019-11-10 NOTE — ED Notes (Signed)
Medications given and charted per [facility]. Tolerated well. Pt denies new or worsening complaints. Will continue to monitor.

## 2019-11-10 NOTE — ED Notes (Signed)
Informed pt on how important it is for him to keep the electrodes on. Pt stated that he will comply.

## 2019-11-10 NOTE — Progress Notes (Addendum)
Pharmacy Antibiotic Note  Jason [name] is a 39 y.o. male admitted on 11/09/2019 with sepsis.  Pharmacy has been consulted for cefepime and vancomycin dosing. Cefepime 2gm and vanc 1250 mg given in ED  Plan: Cont cefepime 2gm IV q8 hours Vancomycin 750 IV q12 F/u renal function, cultures and clinical course  Height: 5\' 8"  (172.7 cm) Weight: 150 lb (68 kg) IBW/kg (Calculated) : 68.4  Temp (24hrs), Avg:98 F (36.7 C), Min:98 F (36.7 C), Max:98 F (36.7 C)  Recent Labs  Lab 11/09/19 1902 11/09/19 1946  WBC  --  23.4*  CREATININE  --  1.31*  LATICACIDVEN 8.0* 5.9*    Estimated Creatinine Clearance: 73.5 mL/min (A) (by C-G formula based on SCr of 1.31 mg/dL (H)).    Allergies  Allergen Reactions  . Morphine And Related Swelling  . Other Hives and Swelling    Antibiotic that was given when he had pneumonia     Thank you for allowing pharmacy to be a part of this patient's care.  Excell Seltzer Poteet 11/10/2019 12:04 AM   Addum:  Continue home xarelto 20 mg po daily

## 2019-11-10 NOTE — ED Notes (Signed)
Pt became nauseated and given PRN zofran with relief.

## 2019-11-10 NOTE — H&P (Signed)
TRH H&P    Patient Demographics:    Jason [name], is a 39 y.o. male  MRN: [id number]  DOB - 1981-01-14  Admit Date - 11/09/2019  Referring MD/NP/PA:  Janetta Hora  Outpatient Primary MD for the patient is Rodriguez-Southworth, Sunday Spillers, PA-C  Patient coming from:  home  Chief complaint-  Unresponsive , diarrhea   HPI:    Jason [name]  is a 39 y.o. male,  w gerd, chronic lower back pain, migraines, sickle cell trait,  h/o pulmonary embolism 11/27/18, h/o renal infarcts, presents with c/o n/v, abd pain (lower), diarrhea starting today.  Pt was on the floor in the house due to persistent n/v.  Slight cough, mostly dry, sometimes yellow, for a couple of weeks.  Pt denies headache, cp, palp, sob, hemoptysis, hematemesis, brbpr, black stool, dysuria, hematuria.   ED notes states that pt was semi unresponsive and w pin point pupils.  Pt was borderline hypotensive 96/60 and given 243m of ns w cbg 131.  Pt was brought to ER by EMS  In Ed,  T 98,  p 60 R 27, Bp 112/59  Pox 95% on RA Wt 68kg  CT brain  IMPRESSION: Negative head CT allowing for mild skull base motion artifact.  CT abd/ pelvis  IMPRESSION: 1. Possible mild bladder wall thickening. Correlation with urinalysis is recommended. 2. Otherwise, no acute intra-abdominal abnormality detected.  CXR IMPRESSION: Left perihilar focus of consolidation with air bronchograms could reflect pneumonia or sequela of aspiration.  Urinalysis wbc 6-10, rbc > 50 Lactic acid 8.0 Wbc 23.4 Hgb 16.7, Plt 406 Na 137, K 3.3, Bun 9, Creatinine 1.31 AG 19, Hco3 17  Ast 24, Alt 20 Glucose 172 Lipase 51  Tylenol <<33Saclicylate  <7 INR 1.1   Pt will be admitted for sepsis (leukocytosis, lactic acidosis, tachypenea) of unclear cause and n/v, diarrhea.       Review of systems:    In addition to the HPI above,  No Fever-chills, No Headache, No changes  with Vision or hearing, No problems swallowing food or Liquids, No Chest pain, Cough or Shortness of Breath, No Abdominal pain, No Nausea or Vomiting, bowel movements are regular, No Blood in stool or Urine, No dysuria, No new skin rashes or bruises, No new joints pains-aches,  No new weakness, tingling, numbness in any extremity, No recent weight gain or loss, No polyuria, polydypsia or polyphagia, No significant Mental Stressors.  All other systems reviewed and are negative.    Past History of the following :    Past Medical History:  Diagnosis Date  . Anxiety   . Back injury   . Cervical disc disease   . Chronic back pain   . Chronic low back pain 03/29/2016  . Family history of adverse reaction to anesthesia   . GERD (gastroesophageal reflux disease)   . History of kidney stones    per mother- "there was a diagnoses at some point"  . Insomnia   . Lumbar disc disease   . MIGRAINE, COMMON 09/23/2010   Qualifier: Diagnosis  of  By: Jenny Reichmann MD, Hunt Oris   . Pneumonia   . Sickle cell trait [facility])       Past Surgical History:  Procedure Laterality Date  . BACK SURGERY    . LUMBAR LAMINECTOMY/DECOMPRESSION MICRODISCECTOMY Bilateral 08/28/2017   Procedure: BILATERAL PARTIAL HEMILAMINECTOMIES L4-5 WITH MICRODISCECTOMY;  Surgeon: Jessy Oto, MD;  Location: [facility];  Service: Orthopedics;  Laterality: Bilateral;  . NO PAST SURGERIES        Social History:      Social History   Tobacco Use  . Smoking status: Current Every Day Smoker    Packs/day: 1.00    Years: 24.00    Pack years: 24.00    Types: Cigarettes  . Smokeless tobacco: Never Used  . Tobacco comment: notified primary care provider and CCM RNCM  Substance Use Topics  . Alcohol use: Yes    Comment: former use - heavy drinker 8 years ago       Family History :     Family History  Problem Relation Age of Onset  . Congestive Heart Failure Father   . Hypertension Mother   . Hyperlipidemia Mother   .  Stroke Mother        Home Medications:   Prior to Admission medications   Medication Sig Start Date End Date Taking? Authorizing Provider  Buprenorphine HCl-Naloxone HCl 8-2 MG FILM SMARTSIG:1 Strip(s) Sublingual Every 8 Hours 10/10/19   [provider]  diazepam (VALIUM) 5 MG tablet Take 1 tablet (5 mg total) by mouth 2 (two) times daily. 08/24/19   Isla Pence, MD  gabapentin (NEURONTIN) 300 MG capsule Take 300 mg by mouth 3 (three) times daily.    [provider]  meclizine (ANTIVERT) 25 MG tablet Take 1 tablet (25 mg total) by mouth 3 (three) times daily as needed for dizziness. 08/24/19   Isla Pence, MD  ondansetron (ZOFRAN ODT) 4 MG disintegrating tablet Take 1 tablet (4 mg total) by mouth every 8 (eight) hours as needed. 08/24/19   Isla Pence, MD  oxycodone (ROXICODONE) 30 MG immediate release tablet Take 30 mg by mouth 3 (three) times daily.    [provider]  rivaroxaban (XARELTO) 20 MG TABS tablet Take 1 tablet (20 mg total) by mouth daily with supper. 04/11/19   Minette Brine, FNP     Allergies:     Allergies  Allergen Reactions  . Morphine And Related Swelling  . Other Hives and Swelling    Antibiotic that was given when he had pneumonia      Physical Exam:   Vitals  Blood pressure (!) 147/88, pulse 92, temperature 98 F (36.7 C), temperature source Oral, resp. rate 20, height '5\' 8"'$  (1.727 m), weight 68 kg, SpO2 95 %.  1.  General: axoxo3  2. Psychiatric: euthymic  3. Neurologic: cn2-12 intact, reflexes 2+ symmetric, diffuse with no clonus, motor 5/5 in all 4 ext  4. HEENMT:  Anicteric, pupils 1.63m symmetric, direct, consensual , near intact Neck: no jvd  5. Respiratory : CTAB, perhaps slight crackles left lung base, no wheeze  6. Cardiovascular : rrr s1, s2, no m/g/r  7. Gastrointestinal:  Abd: soft, nt, nd, +bs  8. Skin:  Ext: no c/c/e, no rash  9.Musculoskeletal:  Good ROM    Data Review:     CBC Recent Labs  Lab 11/09/19 1946  WBC 23.4*  HGB 16.7  HCT 49.7  PLT 406*  MCV 80.9  MCH 27.2  MCHC 33.6  RDW 15.1  LYMPHSABS 1.7  MONOABS 1.6*  EOSABS 0.0  BASOSABS 0.1   ------------------------------------------------------------------------------------------------------------------  Results for orders placed or performed during the hospital encounter of 11/09/19 (from the past 48 hour(s))  CBG monitoring, ED     Status: Abnormal   Collection Time: 11/09/19  5:45 PM  Result Value Ref Range   Glucose-Capillary 117 (H) 70 - 99 mg/dL  Rapid urine drug screen (hospital performed)     Status: None   Collection Time: 11/09/19  6:08 PM  Result Value Ref Range   Opiates NONE DETECTED NONE DETECTED   Cocaine NONE DETECTED NONE DETECTED   Benzodiazepines NONE DETECTED NONE DETECTED   Amphetamines NONE DETECTED NONE DETECTED   Tetrahydrocannabinol NONE DETECTED NONE DETECTED   Barbiturates NONE DETECTED NONE DETECTED    Comment: (NOTE) DRUG SCREEN FOR MEDICAL PURPOSES ONLY.  IF CONFIRMATION IS NEEDED FOR ANY PURPOSE, NOTIFY LAB WITHIN 5 DAYS. LOWEST DETECTABLE LIMITS FOR URINE DRUG SCREEN Drug Class                     Cutoff (ng/mL) Amphetamine and metabolites    1000 Barbiturate and metabolites    200 Benzodiazepine                 341 Tricyclics and metabolites     300 Opiates and metabolites        300 Cocaine and metabolites        300 THC                            50 Performed at [facility], [place] [address], [place], [place] [address]   Urinalysis, Routine w reflex microscopic     Status: Abnormal   Collection Time: 11/09/19  6:08 PM  Result Value Ref Range   Color, Urine AMBER (A) YELLOW    Comment: BIOCHEMICALS MAY BE AFFECTED BY COLOR   APPearance CLOUDY (A) CLEAR   Specific Gravity, Urine 1.023 1.005 - 1.030   pH 5.0 5.0 - 8.0   Glucose, UA NEGATIVE NEGATIVE mg/dL   Hgb urine dipstick MODERATE (A) NEGATIVE   Bilirubin Urine NEGATIVE NEGATIVE    Ketones, ur 5 (A) NEGATIVE mg/dL   Protein, ur 100 (A) NEGATIVE mg/dL   Nitrite NEGATIVE NEGATIVE   Leukocytes,Ua NEGATIVE NEGATIVE   RBC / HPF >50 (H) 0 - 5 RBC/hpf   WBC, UA 6-10 0 - 5 WBC/hpf   Bacteria, UA FEW (A) NONE SEEN   Squamous Epithelial / LPF 0-5 0 - 5   Mucus PRESENT    Hyaline Casts, UA PRESENT     Comment: Performed at [facility], [address], [place], [place] [address]  Lactic acid, plasma     Status: Abnormal   Collection Time: 11/09/19  7:02 PM  Result Value Ref Range   Lactic Acid, Venous 8.0 (HH) 0.5 - 1.9 mmol/L    Comment: CRITICAL RESULT CALLED TO, READ BACK BY AND VERIFIED WITH: RN A WILSON AT 1930 11/09/19 BY L BENFIELD Performed at [facility], [place] [address] [place], [place] [address]   CBC with Differential     Status: Abnormal   Collection Time: 11/09/19  7:46 PM  Result Value Ref Range   WBC 23.4 (H) 4.0 - 10.5 K/uL   RBC 6.14 (H) 4.22 - 5.81 MIL/uL   Hemoglobin 16.7 13.0 - 17.0 g/dL   HCT 49.7 39.0 - 52.0 %  MCV 80.9 80.0 - 100.0 fL   MCH 27.2 26.0 - 34.0 pg   MCHC 33.6 30.0 - 36.0 g/dL   RDW 15.1 11.5 - 15.5 %   Platelets 406 (H) 150 - 400 K/uL   nRBC 0.1 0.0 - 0.2 %   Neutrophils Relative % 84 %   Neutro Abs 19.7 (H) 1.7 - 7.7 K/uL   Lymphocytes Relative 7 %   Lymphs Abs 1.7 0.7 - 4.0 K/uL   Monocytes Relative 7 %   Monocytes Absolute 1.6 (H) 0.1 - 1.0 K/uL   Eosinophils Relative 0 %   Eosinophils Absolute 0.0 0.0 - 0.5 K/uL   Basophils Relative 0 %   Basophils Absolute 0.1 0.0 - 0.1 K/uL   Immature Granulocytes 2 %   Abs Immature Granulocytes 0.38 (H) 0.00 - 0.07 K/uL    Comment: Performed at [facility] [address] [place], [place] [address]  Comprehensive metabolic panel     Status: Abnormal   Collection Time: 11/09/19  7:46 PM  Result Value Ref Range   Sodium 137 135 - 145 mmol/L   Potassium 3.3 (L) 3.5 - 5.1 mmol/L   Chloride 101 98 - 111 mmol/L   CO2 17 (L) 22 - 32 mmol/L   Glucose, Bld 172 (H)  70 - 99 mg/dL   BUN 9 6 - 20 mg/dL   Creatinine, Ser 1.31 (H) 0.61 - 1.24 mg/dL   Calcium 10.5 (H) 8.9 - 10.3 mg/dL   Total Protein 8.3 (H) 6.5 - 8.1 g/dL   Albumin 4.8 3.5 - 5.0 g/dL   AST 24 15 - 41 U/L   ALT 20 0 - 44 U/L   Alkaline Phosphatase 61 38 - 126 U/L   Total Bilirubin 0.4 0.3 - 1.2 mg/dL   GFR calc non Af Amer >60 >60 mL/min   GFR calc Af Amer >60 >60 mL/min   Anion gap 19 (H) 5 - 15    Comment: Performed at [facility], [place] [address] [place], [place] [address]  Lipase, blood     Status: None   Collection Time: 11/09/19  7:46 PM  Result Value Ref Range   Lipase 51 11 - 51 U/L    Comment: Performed at [facility] [address], [place], [place] [address]  Lactic acid, plasma     Status: Abnormal   Collection Time: 11/09/19  7:46 PM  Result Value Ref Range   Lactic Acid, Venous 5.9 (HH) 0.5 - 1.9 mmol/L    Comment: CRITICAL VALUE NOTED.  VALUE IS CONSISTENT WITH PREVIOUSLY REPORTED AND CALLED VALUE. Performed at [facility], [place] [address] [place], [place] [address]   Ethanol     Status: None   Collection Time: 11/09/19  7:46 PM  Result Value Ref Range   Alcohol, Ethyl (B) <10 <10 mg/dL    Comment: (NOTE) Lowest detectable limit for serum alcohol is 10 mg/dL. For medical purposes only. Performed at [facility], [place] [address] [place], [place] [address]   Salicylate level     Status: Abnormal   Collection Time: 11/09/19  7:46 PM  Result Value Ref Range   Salicylate Lvl <9.3 (L) 7.0 - 30.0 mg/dL    Comment: Performed at [facility] [address] [place], [place] [address]  Acetaminophen level     Status: Abnormal   Collection Time: 11/09/19  7:46 PM  Result Value Ref Range   Acetaminophen (Tylenol), Serum <  10 (L) 10 - 30 ug/mL    Comment: (NOTE) Therapeutic concentrations vary significantly. A range of 10-30 ug/mL  may be an effective concentration for many patients. However, some  are best treated at concentrations  outside of this range. Acetaminophen concentrations >150 ug/mL at 4 hours after ingestion  and >50 ug/mL at 12 hours after ingestion are often associated with  toxic reactions. Performed at [facility], [place] [address] [place], [place] [address]   POC SARS Coronavirus 2 Ag-ED - Nasal Swab (BD Veritor Kit)     Status: None   Collection Time: 11/09/19  9:19 PM  Result Value Ref Range   SARS Coronavirus 2 Ag NEGATIVE NEGATIVE    Comment: (NOTE) SARS-CoV-2 antigen NOT DETECTED.  Negative results are presumptive.  Negative results do not preclude SARS-CoV-2 infection and should not be used as the sole basis for treatment or other patient management decisions, including infection  control decisions, particularly in the presence of clinical signs and  symptoms consistent with COVID-19, or in those who have been in contact with the virus.  Negative results must be combined with clinical observations, patient history, and epidemiological information. The expected result is Negative. Fact Sheet for Patients: PodPark.tn Fact Sheet for Healthcare Providers: GiftContent.is This test is not yet approved or cleared by the [place] FDA and  has been authorized for detection and/or diagnosis of SARS-CoV-2 by FDA under an Emergency Use Authorization (EUA).  This EUA will remain in effect (meaning this test can be used) for the duration of  the COVID-19 de claration under Section 564(b)(1) of the Act, 21 U.S.C. section 360bbb-3(b)(1), unless the authorization is terminated or revoked sooner.   APTT     Status: None   Collection Time: 11/09/19 11:03 PM  Result Value Ref Range   aPTT 24 24 - 36 seconds    Comment: Performed at [facility], [place] [address], [place], [place] [address]  Protime-INR     Status: None   Collection Time: 11/09/19 11:03 PM  Result Value Ref Range   Prothrombin Time 13.9 11.4 - 15.2 seconds   INR  1.1 0.8 - 1.2    Comment: (NOTE) INR goal varies based on device and disease states. Performed at [facility], [place] [address], [place], [place] [address]     Chemistries  Recent Labs  Lab 11/09/19 1946  NA 137  K 3.3*  CL 101  CO2 17*  GLUCOSE 172*  BUN 9  CREATININE 1.31*  CALCIUM 10.5*  AST 24  ALT 20  ALKPHOS 61  BILITOT 0.4   ------------------------------------------------------------------------------------------------------------------  ------------------------------------------------------------------------------------------------------------------ GFR: Estimated Creatinine Clearance: 73.5 mL/min (A) (by C-G formula based on SCr of 1.31 mg/dL (H)). Liver Function Tests: Recent Labs  Lab 11/09/19 1946  AST 24  ALT 20  ALKPHOS 61  BILITOT 0.4  PROT 8.3*  ALBUMIN 4.8   Recent Labs  Lab 11/09/19 1946  LIPASE 51   No results for input(s): AMMONIA in the last 168 hours. Coagulation Profile: Recent Labs  Lab 11/09/19 2303  INR 1.1   Cardiac Enzymes: No results for input(s): CKTOTAL, CKMB, CKMBINDEX, TROPONINI in the last 168 hours. BNP (last 3 results) No results for input(s): PROBNP in the last 8760 hours. HbA1C: No results for input(s): HGBA1C in the last 72 hours. CBG: Recent Labs  Lab 11/09/19 1745  GLUCAP 117*   Lipid Profile: No results for input(s): CHOL, HDL, LDLCALC, TRIG, CHOLHDL, LDLDIRECT in the last 72  hours. Thyroid Function Tests: No results for input(s): TSH, T4TOTAL, FREET4, T3FREE, THYROIDAB in the last 72 hours. Anemia Panel: No results for input(s): VITAMINB12, FOLATE, FERRITIN, TIBC, IRON, RETICCTPCT in the last 72 hours.  --------------------------------------------------------------------------------------------------------------- Urine analysis:    Component Value Date/Time   COLORURINE AMBER (A) 11/09/2019 1808   APPEARANCEUR CLOUDY (A) 11/09/2019 1808   APPEARANCEUR Hazy 01/13/2014 1418   LABSPEC 1.023  11/09/2019 1808   LABSPEC 1.028 01/13/2014 1418   PHURINE 5.0 11/09/2019 1808   GLUCOSEU NEGATIVE 11/09/2019 1808   GLUCOSEU NEGATIVE 03/23/2016 1215   HGBUR MODERATE (A) 11/09/2019 1808   BILIRUBINUR NEGATIVE 11/09/2019 1808   BILIRUBINUR negative 12/11/2018 1645   BILIRUBINUR Negative 01/13/2014 1418   KETONESUR 5 (A) 11/09/2019 1808   PROTEINUR 100 (A) 11/09/2019 1808   UROBILINOGEN 0.2 12/11/2018 1645   UROBILINOGEN 0.2 03/23/2016 1215   NITRITE NEGATIVE 11/09/2019 1808   LEUKOCYTESUR NEGATIVE 11/09/2019 1808   LEUKOCYTESUR Negative 01/13/2014 1418      Imaging Results:    CT Head Wo Contrast  Result Date: 11/09/2019 CLINICAL DATA:  Encephalopathy. EXAM: CT HEAD WITHOUT CONTRAST TECHNIQUE: Contiguous axial images were obtained from the base of the skull through the vertex without intravenous contrast. COMPARISON:  Head CT 08/24/2019 FINDINGS: Brain: Mild motion artifact through the skull base. No intracranial hemorrhage, mass effect, or midline shift. No hydrocephalus. The basilar cisterns are patent. No evidence of territorial infarct or acute ischemia. No extra-axial or intracranial fluid collection. Vascular: No hyperdense vessel or unexpected calcification. Skull: No fracture or focal lesion. Mild motion artifact through the skull base. Sinuses/Orbits: No acute finding allowing for motion. Other: None. IMPRESSION: Negative head CT allowing for mild skull base motion artifact. Electronically Signed   By: Keith Rake M.D.   On: 11/09/2019 23:17   CT ABDOMEN PELVIS W CONTRAST  Result Date: 11/09/2019 CLINICAL DATA:  Nausea and vomiting abdominal pain.  Sepsis. EXAM: CT ABDOMEN AND PELVIS WITH CONTRAST TECHNIQUE: Multidetector CT imaging of the abdomen and pelvis was performed using the standard protocol following bolus administration of intravenous contrast. CONTRAST:  160m OMNIPAQUE IOHEXOL 300 MG/ML  SOLN COMPARISON:  12/03/2018 FINDINGS: Lower chest: The lung bases are clear.  The heart size is normal. Hepatobiliary: The liver is normal. Normal gallbladder.There is no biliary ductal dilation. Pancreas: Normal contours without ductal dilatation. No peripancreatic fluid collection. Spleen: No splenic laceration or hematoma. Adrenals/Urinary Tract: --Adrenal glands: No adrenal hemorrhage. --Right kidney/ureter: No hydronephrosis or perinephric hematoma. --Left kidney/ureter: No hydronephrosis or perinephric hematoma. --Urinary bladder: There appears to be some mild bladder wall thickening. Stomach/Bowel: --Stomach/Duodenum: No hiatal hernia or other gastric abnormality. Normal duodenal course and caliber. --Small bowel: No dilatation or inflammation. --Colon: There is liquid stool at the level of the rectum. The colon is relatively decompressed. --Appendix: Not visualized. No right lower quadrant inflammation or free fluid. Vascular/Lymphatic: Normal course and caliber of the major abdominal vessels. There is suggestion of a circumaortic left renal vein, a normal variant. --No retroperitoneal lymphadenopathy. --No mesenteric lymphadenopathy. --there are few mildly enlarged inguinal lymph nodes. Reproductive: Unremarkable Other: No ascites or free air. The abdominal wall is normal. Musculoskeletal. No acute displaced fractures. IMPRESSION: 1. Possible mild bladder wall thickening. Correlation with urinalysis is recommended. 2. Otherwise, no acute intra-abdominal abnormality detected. Electronically Signed   By: CConstance HolsterM.D.   On: 11/09/2019 23:15   DG Chest Port 1 View  Result Date: 11/09/2019 CLINICAL DATA:  Fever EXAM: PORTABLE CHEST 1 VIEW COMPARISON:  Radiograph 12/03/2018,  CT 12/26/2018 FINDINGS: Some streaky opacities in the lung bases likely reflect areas of atelectasis. There is a left perihilar focus of consolidation with air bronchograms. No pneumothorax. No effusion. The cardiomediastinal contours are unremarkable. No acute osseous or soft tissue abnormality.  IMPRESSION: Left perihilar focus of consolidation with air bronchograms could reflect pneumonia or sequela of aspiration. Electronically Signed   By: Lovena Le M.D.   On: 11/09/2019 20:47       Assessment & Plan:    Active Problems:   Sepsis ([place])  Sepsis  Blood culture x2 Urine strep antigen Urine legionella antigen Urine culture Stool studies vanco iv, cefepime iv, pharmacy to dose Flagyl as below  Pneumonia Blood culture as above Urine strep antigen , urine legionella antigen Abx as above  Acute lower UTI Urine culture Abx as above  N/v Zofran '4mg'$  iv q6h prn   Diarrhea Stool studies pending Flagyl '500mg'$  iv tid  Hypokalemia Replete Check cmp in am  ARF Hydrate with ns iv Check cmp in am  H/o PE Cont Xarelto  Chronic back pain Pt states that he is taking oxycodone and his mother states that he has been on oxycodone but no oxycodone recently on review Unclear what he is taking, please ask pharmacy in AM to verify Will given tramadol '50mg'$  po q6h prn up to 2 doses   DVT Prophylaxis-   Xarelto,  SCDs  AM Labs Ordered, also please review Full Orders  Family Communication: Admission, patients condition and plan of care including tests being ordered have been discussed with the patient  who indicate understanding and agree with the plan and Code Status.  Code Status:  FULL CODE per patient, notified   Admission status:  Inpatient: Based on patients clinical presentation and evaluation of above clinical data, I have made determination that patient meets Inpatient criteria at this time.  Pt has sepsis unclear source,  Pt will require iv abx, and has high risk of clinical deterioration and will require > 2 nites stay.   Time spent in minutes : 70 mintues   Jani Gravel M.D on 11/10/2019 at 12:01 AM

## 2019-11-10 NOTE — ED Notes (Signed)
Mom- gin [phone] would like an update when pt has a bed upstairs

## 2019-11-11 ENCOUNTER — Other Ambulatory Visit: Payer: Self-pay

## 2019-11-11 DIAGNOSIS — R112 Nausea with vomiting, unspecified: Secondary | ICD-10-CM

## 2019-11-11 LAB — CBC WITH DIFFERENTIAL/PLATELET
Abs Immature Granulocytes: 0.03 10*3/uL (ref 0.00–0.07)
Basophils Absolute: 0 10*3/uL (ref 0.0–0.1)
Basophils Relative: 0 %
Eosinophils Absolute: 0 10*3/uL (ref 0.0–0.5)
Eosinophils Relative: 0 %
HCT: 44.3 % (ref 39.0–52.0)
Hemoglobin: 15.3 g/dL (ref 13.0–17.0)
Immature Granulocytes: 0 %
Lymphocytes Relative: 14 %
Lymphs Abs: 1.7 10*3/uL (ref 0.7–4.0)
MCH: 26.7 pg (ref 26.0–34.0)
MCHC: 34.5 g/dL (ref 30.0–36.0)
MCV: 77.4 fL — ABNORMAL LOW (ref 80.0–100.0)
Monocytes Absolute: 1 10*3/uL (ref 0.1–1.0)
Monocytes Relative: 8 %
Neutro Abs: 9.2 10*3/uL — ABNORMAL HIGH (ref 1.7–7.7)
Neutrophils Relative %: 78 %
Platelets: 318 10*3/uL (ref 150–400)
RBC: 5.72 MIL/uL (ref 4.22–5.81)
RDW: 14.6 % (ref 11.5–15.5)
WBC: 12 10*3/uL — ABNORMAL HIGH (ref 4.0–10.5)
nRBC: 0 % (ref 0.0–0.2)

## 2019-11-11 LAB — URINE CULTURE

## 2019-11-11 LAB — COMPREHENSIVE METABOLIC PANEL
ALT: 20 U/L (ref 0–44)
AST: 26 U/L (ref 15–41)
Albumin: 4.4 g/dL (ref 3.5–5.0)
Alkaline Phosphatase: 46 U/L (ref 38–126)
Anion gap: 14 (ref 5–15)
BUN: 16 mg/dL (ref 6–20)
CO2: 21 mmol/L — ABNORMAL LOW (ref 22–32)
Calcium: 9.5 mg/dL (ref 8.9–10.3)
Chloride: 104 mmol/L (ref 98–111)
Creatinine, Ser: 0.99 mg/dL (ref 0.61–1.24)
GFR calc Af Amer: >60 mL/min (ref >60)
GFR calc non Af Amer: >60 mL/min (ref >60)
Glucose, Bld: 98 mg/dL (ref 70–99)
Potassium: 3.8 mmol/L (ref 3.5–5.1)
Sodium: 139 mmol/L (ref 135–145)
Total Bilirubin: 0.9 mg/dL (ref 0.3–1.2)
Total Protein: 7.7 g/dL (ref 6.5–8.1)

## 2019-11-11 LAB — LEGIONELLA PNEUMOPHILA SEROGP 1 UR AG: L. pneumophila Serogp 1 Ur Ag: NEGATIVE

## 2019-11-11 MED ORDER — OXYCODONE HCL 5 MG PO TABS
5.0000 mg | ORAL_TABLET | Freq: Four times a day (QID) | ORAL | Status: DC | PRN
  Administered 2019-11-11 – 2019-11-14 (×8): 5 mg via ORAL
  Filled 2019-11-11 (×8): qty 1

## 2019-11-11 MED ORDER — RIVAROXABAN 20 MG PO TABS: 20.0000 mg | ORAL_TABLET | Freq: Every day | ORAL | Status: DC

## 2019-11-11 MED ORDER — TRAMADOL HCL 50 MG PO TABS
50.0000 mg | ORAL_TABLET | Freq: Four times a day (QID) | ORAL | Status: DC | PRN
  Administered 2019-11-11 – 2019-11-14 (×5): 50 mg via ORAL
  Filled 2019-11-11 (×5): qty 1

## 2019-11-11 MED ORDER — SODIUM CHLORIDE 0.9 % IV SOLN: INTRAVENOUS | Status: DC

## 2019-11-11 MED ORDER — VANCOMYCIN HCL IN DEXTROSE 1-5 GM/200ML-% IV SOLN
1000.0000 mg | Freq: Two times a day (BID) | INTRAVENOUS | Status: DC
  Administered 2019-11-11 – 2019-11-12 (×2): 1000 mg via INTRAVENOUS
  Filled 2019-11-11 (×3): qty 200

## 2019-11-11 MED ORDER — PANTOPRAZOLE SODIUM 40 MG IV SOLR
40.0000 mg | INTRAVENOUS | Status: DC
  Administered 2019-11-11 – 2019-11-12 (×2): 40 mg via INTRAVENOUS
  Filled 2019-11-11 (×2): qty 40

## 2019-11-11 NOTE — Progress Notes (Addendum)
Pharmacy Antibiotic Note  Jason [name] is a 39 y.o. male admitted on 11/09/2019 on day # Vancomycin, Cefepime and Metronidazole for empiric coverage for sepsis, pneumonia and UTI.   Creatinine improved from 1.31 > 0.99.  Plan: Increase empiric vancomycin from 750 mg > 1gm IV q12h. Goal AUC 400-550. Expected AUC: 474 SCr used: 0.99 Continue Cefepime 2gm IV q8h. Also on Metronidazole 500 mg IV q8h Follow renal function, culture data, clinical progress and antibiotic plans.   Height: 5\' 8"  (172.7 cm) Weight: 150 lb 12.7 oz (68.4 kg) IBW/kg (Calculated) : 68.4  Temp (24hrs), Avg:99.7 F (37.6 C), Min:99.2 F (37.3 C), Max:100 F (37.8 C)  Recent Labs  Lab 11/09/19 1902 11/09/19 1946 11/10/19 0242 11/11/19 0138  WBC  --  23.4* 21.5* 12.0*  CREATININE  --  1.31* 1.15 0.99  LATICACIDVEN 8.0* 5.9*  --   --     Estimated Creatinine Clearance: 97.9 mL/min (by C-G formula based on SCr of 0.99 mg/dL).    Allergies  Allergen Reactions  . Morphine And Related Swelling  . Other Hives and Swelling    Antibiotic that was given when he had pneumonia     Antimicrobials this admission: Vancomycin 1/23 >> Cefepime 1/23 >> Metronidazole 1/23 >>  Dose adjustments this admission:  1/25: empiric Vanc incr 750 mg > 1gm IV q12h for improved renal fxn  Microbiology results: 1/23 blood x 1: no growth x 2 days to date 1/24 Covid: negative 1/23 urine: multiple species, suggest recollect 1/24 C diff: no sample yet 1/24 Strep pneumo ag: negative 1/24 Legionella ag: in process  Thank you for allowing pharmacy to be a part of this patient's care.  Arty Baumgartner, [place] Phone: [phone] 11/11/2019 11:27 AM

## 2019-11-11 NOTE — Progress Notes (Signed)
Pt's mother called for pt update. Pt stated okay to update her. All questions answered to satisfaction.

## 2019-11-11 NOTE — Plan of Care (Signed)
  Problem: Education: Goal: Knowledge of General Education information will improve Description: Including pain rating scale, medication(s)/side effects and non-pharmacologic comfort measures Outcome: Progressing   Problem: Health Behavior/Discharge Planning: Goal: Ability to manage health-related needs will improve Outcome: Progressing   Problem: Clinical Measurements: Goal: Ability to maintain clinical measurements within normal limits will improve Outcome: Progressing   Problem: Elimination: Goal: Will not experience complications related to urinary retention Outcome: Progressing   Problem: Pain Managment: Goal: General experience of comfort will improve Outcome: Progressing   Problem: Safety: Goal: Ability to remain free from injury will improve Outcome: Progressing   Problem: Skin Integrity: Goal: Risk for impaired skin integrity will decrease Outcome: Progressing   

## 2019-11-11 NOTE — Progress Notes (Signed)
Pt called this RN that he had an emesis, bilious in nature. Cleaned pt and made comfortable in bed and was given zofran 4mg  IV. Notified MD and will continue to monitor patient with remainder of shift.

## 2019-11-11 NOTE — Progress Notes (Signed)
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  CT abdomen pelvis with contrast unremarkable chest x-ray concerning for pneumonia versus aspiration.  Patient admitted for further management.  Subjective:  Vomited early this morning. C/o weakness, nausea, diarrhea for 3 days no Bm since admission reports was taking oxy from his mother and suboxone did not work- was supposed to see pain clinic Dr Carnella [place] today.    Assessment & Plan:  Intractable nausea and vomiting and abdominal:?  Gastroenteritis/food borne illness.  CT abd pelvis with contrast no acute finding except mild bladder wall thickening.  His lfts and lipase are unremarkable.  Complains of ongoing nausea vomiting abdominal pain but no more diarrhea.  Will consult GI.   Cont hydration. Added PPI. remains NPO.   Sepsis suspected, with imaging concerning for pneumonia community-acquired: COVID-19 negative, blood culture negative since admission, urine culture multiple species.  WBC nicely improved.  Continue current IV antibiotics Flagyl cefepime and vancomycin.  Check procalcitonin CBC in the morning.  Lactic acidosis likely from #1 and #2- resolved with ivf.  Suspect UTI urine culture multiple species  Diarrhea: No recurrence of diarrhea, C. 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  Hypokalemia resolved  Chronic Back pain on a chronic oxycodone: It seems he was switched to Suboxone by his pain management doctor in December but patient reports he has not been using Suboxone as its not  working for him and has been using his mother's oxycodone.  Reports he was supposed to see pain doctor today. Pain no relived by tramadol, add Low-dose oxycodone.  Body mass index is 22.93 kg/m.   DVT prophylaxis:Xareelto Code Status:FULL Family Communication: plan of care discussed with patient at bedside. Disposition Plan: Remains inpatient pending improvement in his nausea vomiting and inability to tolerate diet.    Consultants: none Procedures:see note Microbiology: Antimicrobials: Anti-infectives (From admission, onward)   Start     Dose/Rate Route Frequency Ordered Stop   11/11/19 1200  vancomycin (VANCOREADY) IVPB 750 mg/150 mL     750 mg 150 mL/hr over 60 Minutes Intravenous Every 12 hours 11/10/19 0012     11/11/19 0600  ceFEPIme (MAXIPIME) 2 g in sodium chloride 0.9 % 100 mL IVPB     2 g 200 mL/hr over 30 Minutes Intravenous Every 8 hours 11/10/19 0012     11/10/19 0600  metroNIDAZOLE (FLAGYL) IVPB 500 mg     500 mg 100 mL/hr over 60 Minutes Intravenous Every 8 hours 11/10/19 0013     11/09/19 2100  vancomycin (VANCOREADY) IVPB 1250 mg/250 mL     1,250 mg 166.7 mL/hr over 90 Minutes Intravenous  Once 11/09/19 2023 11/10/19 0031   11/09/19 2015  ceFEPIme (MAXIPIME) 2 g in sodium chloride 0.9 % 100 mL IVPB     2 g 200 mL/hr over 30 Minutes Intravenous  Once 11/09/19 2012 11/09/19 2221   11/09/19 2015  metroNIDAZOLE (FLAGYL) IVPB 500 mg     500 mg 100 mL/hr over 60 Minutes Intravenous  Once 11/09/19 2012 11/10/19 0030   11/09/19 2015  vancomycin (VANCOCIN) IVPB 1000 mg/200 mL premix  Status:  Discontinued  1,000 mg 200 mL/hr over 60 Minutes Intravenous  Once 11/09/19 2012 11/09/19 2023      Medications: Scheduled Meds: . gabapentin  300 mg Oral TID  . rivaroxaban  20 mg Oral Daily   Continuous Infusions: . ceFEPime (MAXIPIME) IV 2 g (11/11/19 0511)  . metronidazole 500 mg (11/11/19 0620)  . vancomycin      Objective: Vitals:   11/10/19 1302 11/10/19 2246  11/11/19 0500 11/11/19 0514  BP: 116/81 (!) 131/92  123/77  Pulse: (!) 59 (!) 58  75  Resp: 20 18  18   Temp: 100 F (37.8 C) 99.8 F (37.7 C)  99.2 F (37.3 C)  TempSrc: Oral Oral  Oral  SpO2: 100% 100%  100%  Weight:   68.4 kg   Height:        Intake/Output Summary (Last 24 hours) at 11/11/2019 0923 Last data filed at 11/11/2019 0920 Gross per 24 hour  Intake 320 ml  Output 800 ml  Net -480 ml   Filed Weights   11/09/19 2050 11/11/19 0500  Weight: 68 kg 68.4 kg   Weight change: 0.36 kg  Body mass index is 22.93 kg/m.  Intake/Output from previous day: 01/24 0701 - 01/25 0700 In: 22 [P.O.:120; IV Piggyback:700] Out: 400 [Urine:400] Intake/Output this shift: Total I/O In: -  Out: 400 [Urine:400]  Examination:  General exam: AAOx3, ill looking, on RA HEENT:Oral mucosa moist, Ear/Nose WNL grossly, dentition normal. Respiratory system: Diminished at the base,no wheezing or crackles,no use of accessory muscle Cardiovascular system: S1 & S2 +, No JVD,. Gastrointestinal system: Abdomen soft, MILDLY Tender centrally,ND, BS+ Nervous System:Alert, awake, moving extremities and grossly nonfocal Extremities: No edema, distal peripheral pulses palpable.  Skin: No rashes,no icterus. MSK: Normal muscle bulk,tone, power tattoos +   Data Reviewed: I have personally reviewed following labs and imaging studies  CBC: Recent Labs  Lab 11/09/19 1946 11/10/19 0242 11/11/19 0138  WBC 23.4* 21.5* 12.0*  NEUTROABS 19.7*  --  9.2*  HGB 16.7 15.5 15.3  HCT 49.7 46.0 44.3  MCV 80.9 80.7 77.4*  PLT 406* 351 0000000   Basic Metabolic Panel: Recent Labs  Lab 11/09/19 1946 11/10/19 0242 11/11/19 0138  NA 137 143 139  K 3.3* 4.4 3.8  CL 101 106 104  CO2 17* 20* 21*  GLUCOSE 172* 121* 98  BUN 9 11 16   CREATININE 1.31* 1.15 0.99  CALCIUM 10.5* 9.8 9.5   GFR: Estimated Creatinine Clearance: 97.9 mL/min (by C-G formula based on SCr of 0.99 mg/dL). Liver Function Tests: Recent  Labs  Lab 11/09/19 1946 11/10/19 0242 11/11/19 0138  AST 24 26 26   ALT 20 18 20   ALKPHOS 61 52 46  BILITOT 0.4 0.4 0.9  PROT 8.3* 7.6 7.7  ALBUMIN 4.8 4.3 4.4   Recent Labs  Lab 11/09/19 1946  LIPASE 51   No results for input(s): AMMONIA in the last 168 hours. Coagulation Profile: Recent Labs  Lab 11/09/19 2303  INR 1.1   Cardiac Enzymes: No results for input(s): CKTOTAL, CKMB, CKMBINDEX, TROPONINI in the last 168 hours. BNP (last 3 results) No results for input(s): PROBNP in the last 8760 hours. HbA1C: No results for input(s): HGBA1C in the last 72 hours. CBG: Recent Labs  Lab 11/09/19 1745  GLUCAP 117*   Lipid Profile: No results for input(s): CHOL, HDL, LDLCALC, TRIG, CHOLHDL, LDLDIRECT in the last 72 hours. Thyroid Function Tests: No results for input(s): TSH, T4TOTAL, FREET4, T3FREE, THYROIDAB in the last 72 hours. Anemia Panel:  No results for input(s): VITAMINB12, FOLATE, FERRITIN, TIBC, IRON, RETICCTPCT in the last 72 hours. Sepsis Labs: Recent Labs  Lab 11/09/19 1902 11/09/19 1946  LATICACIDVEN 8.0* 5.9*    Recent Results (from the past 240 hour(s))  Blood culture (routine x 2)     Status: None (Preliminary result)   Collection Time: 11/09/19  8:21 PM   Specimen: BLOOD  Result Value Ref Range Status   Specimen Description BLOOD RIGHT ANTECUBITAL  Final   Special Requests   Final    BOTTLES DRAWN AEROBIC AND ANAEROBIC Blood Culture adequate volume   Culture   Final    NO GROWTH 2 DAYS Performed at [facility], [place] [address], [place], [place] [address]    Report Status PENDING  Incomplete  Urine culture     Status: Abnormal   Collection Time: 11/09/19  8:58 PM   Specimen: In/Out Cath Urine  Result Value Ref Range Status   Specimen Description IN/OUT CATH URINE  Final   Special Requests   Final    NONE Performed at [facility], [place] [address], [place], [place] [address]    Culture MULTIPLE SPECIES PRESENT, SUGGEST  RECOLLECTION (A)  Final   Report Status 11/11/2019 FINAL  Final  SARS CORONAVIRUS 2 (TAT 6-24 HRS)     Status: None   Collection Time: 11/10/19 12:31 AM  Result Value Ref Range Status   SARS Coronavirus 2 NEGATIVE NEGATIVE Final    Comment: (NOTE) SARS-CoV-2 target nucleic acids are NOT DETECTED. The SARS-CoV-2 RNA is generally detectable in upper and lower respiratory specimens during the acute phase of infection. Negative results do not preclude SARS-CoV-2 infection, do not rule out co-infections with other pathogens, and should not be used as the sole basis for treatment or other patient management decisions. Negative results must be combined with clinical observations, patient history, and epidemiological information. The expected result is Negative. Fact Sheet for Patients: SugarRoll.be Fact Sheet for Healthcare Providers: https://www.woods-mathews.com/ This test is not yet approved or cleared by the [place] FDA and  has been authorized for detection and/or diagnosis of SARS-CoV-2 by FDA under an Emergency Use Authorization (EUA). This EUA will remain  in effect (meaning this test can be used) for the duration of the COVID-19 declaration under Section 56 4(b)(1) of the Act, 21 U.S.C. section 360bbb-3(b)(1), unless the authorization is terminated or revoked sooner. Performed at [facility], [place] [address], [place], [place] [address]       Radiology Studies: CT Head Wo Contrast  Result Date: 11/09/2019 CLINICAL DATA:  Encephalopathy. EXAM: CT HEAD WITHOUT CONTRAST TECHNIQUE: Contiguous axial images were obtained from the base of the skull through the vertex without intravenous contrast. COMPARISON:  Head CT 08/24/2019 FINDINGS: Brain: Mild motion artifact through the skull base. No intracranial hemorrhage, mass effect, or midline shift. No hydrocephalus. The basilar cisterns are patent. No evidence of territorial infarct or  acute ischemia. No extra-axial or intracranial fluid collection. Vascular: No hyperdense vessel or unexpected calcification. Skull: No fracture or focal lesion. Mild motion artifact through the skull base. Sinuses/Orbits: No acute finding allowing for motion. Other: None. IMPRESSION: Negative head CT allowing for mild skull base motion artifact. Electronically Signed   By: Keith Rake M.D.   On: 11/09/2019 23:17   CT ABDOMEN PELVIS W CONTRAST  Result Date: 11/09/2019 CLINICAL DATA:  Nausea and vomiting abdominal pain.  Sepsis. EXAM: CT ABDOMEN AND PELVIS WITH CONTRAST TECHNIQUE: Multidetector CT imaging of the abdomen  and pelvis was performed using the standard protocol following bolus administration of intravenous contrast. CONTRAST:  125mL OMNIPAQUE IOHEXOL 300 MG/ML  SOLN COMPARISON:  12/03/2018 FINDINGS: Lower chest: The lung bases are clear. The heart size is normal. Hepatobiliary: The liver is normal. Normal gallbladder.There is no biliary ductal dilation. Pancreas: Normal contours without ductal dilatation. No peripancreatic fluid collection. Spleen: No splenic laceration or hematoma. Adrenals/Urinary Tract: --Adrenal glands: No adrenal hemorrhage. --Right kidney/ureter: No hydronephrosis or perinephric hematoma. --Left kidney/ureter: No hydronephrosis or perinephric hematoma. --Urinary bladder: There appears to be some mild bladder wall thickening. Stomach/Bowel: --Stomach/Duodenum: No hiatal hernia or other gastric abnormality. Normal duodenal course and caliber. --Small bowel: No dilatation or inflammation. --Colon: There is liquid stool at the level of the rectum. The colon is relatively decompressed. --Appendix: Not visualized. No right lower quadrant inflammation or free fluid. Vascular/Lymphatic: Normal course and caliber of the major abdominal vessels. There is suggestion of a circumaortic left renal vein, a normal variant. --No retroperitoneal lymphadenopathy. --No mesenteric  lymphadenopathy. --there are few mildly enlarged inguinal lymph nodes. Reproductive: Unremarkable Other: No ascites or free air. The abdominal wall is normal. Musculoskeletal. No acute displaced fractures. IMPRESSION: 1. Possible mild bladder wall thickening. Correlation with urinalysis is recommended. 2. Otherwise, no acute intra-abdominal abnormality detected. Electronically Signed   By: Constance Holster M.D.   On: 11/09/2019 23:15   DG Chest Port 1 View  Result Date: 11/09/2019 CLINICAL DATA:  Fever EXAM: PORTABLE CHEST 1 VIEW COMPARISON:  Radiograph 12/03/2018, CT 12/26/2018 FINDINGS: Some streaky opacities in the lung bases likely reflect areas of atelectasis. There is a left perihilar focus of consolidation with air bronchograms. No pneumothorax. No effusion. The cardiomediastinal contours are unremarkable. No acute osseous or soft tissue abnormality. IMPRESSION: Left perihilar focus of consolidation with air bronchograms could reflect pneumonia or sequela of aspiration. Electronically Signed   By: Lovena Le M.D.   On: 11/09/2019 20:47      LOS: 2 days   Time spent: More than 50% of that time was spent in counseling and/or coordination of care.  Antonieta Pert, MD [facility]  11/11/2019, 9:23 AM

## 2019-11-11 NOTE — Consult Note (Addendum)
[facility] Consult: 12:23 PM 11/11/2019  LOS: 2 days    Referring Provider: Dr Jason [name]   Primary Care Physician:  Jason [name] of [place].  Dismissed from Jason [name] primary care, Dr. Cathlean [name] in 02/2018 (due to discrepancy on reported oxy use). Primary Gastroenterologist:  Dr. Ardis [name] once 2011.     Reason for Consultation:  N/V/D, abdominal pain.     HPI: Jason [name] is a 39 y.o. male.  PMH: On oxycodone for back pain.  Previous spinal surgery.  Kidney stones.  Renal infarcts.  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  Sickle cell trait.  Anxiety.  Migraine headaches.  .  Seen once in the office by Dr. Ardis [name] in 2011 for left flank pain.  He had 2 CTs prior to this, the first, noncontrast study, raised question of fluid around his colon but repeat CT 1 week later showed no problems with his colon.  Dr. Ardis [name] felt that the patient's pain was musculoskeletal in nature and declined patient's request for prescribing narcotics or other prescription analgesics.  Patient provided with PPI samples.  Patient has not taken Eliquis for several days.  Says he does not think he needs it anymore but has not been told by [name] that it is okay to discontinue.  Patient presented to the ED for evaluation of confusion.  For 2 or 3 days he had been having nonbloody, dark emesis, periumbilical abdominal pain, loose, nonbloody stools, diaphoresis whenever he vomits..  Had not been taking any meds for this and denies use of NSAIDs other than occasionally.  Also says he had run out of oxycodone.  At baseline he does not have problems with reflux, nausea, abdominal pain, weight loss.  Normally has soft brown stools 2 or 3 times per week. Labs confirmed AKI which  has resolved.  Lactic acidosis which is improving.  LFTs, lipase normal. Not anemic with Hb 15.3 but MCV low at 77.  Iron low at 20, iron saturation low at 8% ferritin 165 WBCs 23.4>> 12. CXR shows pneumonia versus aspiration. COVID-19 negative Head CT negative. 11/09/2019 CTAP revealed possible mild urinary bladder wall thickening and a few, mildly enlarged inguinal lymph nodes.  Gallbladder, biliary tree, liver pancreas, stomach, small bowel, colon unremarkable other than some liquid stool at the rectum.  The abdominal pain has improved but he has persistent nausea though he is wondering if he could have some clear liquids.  Tmax 100 F on HD 2, aftebrile since.   Daily IV Protonix started today.  Maxipime day 2.  Flagyl day 3.  Vancomycin discontinued after 1 dose on 1/23.  Fm Hx negative for colorectal or gastric cancers.  No history of ulcer disease or bleeding disorders. 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 Anxiety   . Back injury   . Cervical disc disease   . Chronic back pain   . Chronic low back pain 03/29/2016  . Family history of  adverse reaction to anesthesia   . GERD (gastroesophageal reflux disease)   . History of kidney stones    per mother- "there was a diagnoses at some point"  . Insomnia   . Lumbar disc disease   . MIGRAINE, COMMON 09/23/2010   Qualifier: Diagnosis of  By: Jason [name], Jason [name]   . Pneumonia   . Sickle cell trait [facility])     Past Surgical History:  Procedure Laterality Date  . BACK SURGERY    . LUMBAR LAMINECTOMY/DECOMPRESSION MICRODISCECTOMY Bilateral 08/28/2017   Procedure: BILATERAL PARTIAL HEMILAMINECTOMIES L4-5 WITH MICRODISCECTOMY;  Surgeon: Jason Oto, [name];  Location: [facility];  Service: Orthopedics;  Laterality: Bilateral;  . NO PAST SURGERIES      Prior to Admission medications   Medication Sig Start Date End Date Taking? Authorizing Provider  gabapentin (NEURONTIN) 300 MG  capsule Take 900 mg by mouth daily.    Yes Provider, Historical, [name]  ondansetron (ZOFRAN ODT) 4 MG disintegrating tablet Take 1 tablet (4 mg total) by mouth every 8 (eight) hours as needed. Patient taking differently: Take 4 mg by mouth every 8 (eight) hours as needed for nausea or vomiting.  08/24/19  Yes Jason Pence, [name]  rivaroxaban (XARELTO) 20 MG TABS tablet Take 1 tablet (20 mg total) by mouth daily with supper. 04/11/19  Yes Jason Brine, FNP  diazepam (VALIUM) 5 MG tablet Take 1 tablet (5 mg total) by mouth 2 (two) times daily. Patient not taking: Reported on 11/10/2019 08/24/19   Jason Pence, [name]  meclizine (ANTIVERT) 25 MG tablet Take 1 tablet (25 mg total) by mouth 3 (three) times daily as needed for dizziness. Patient not taking: Reported on 11/10/2019 08/24/19   Jason Pence, [name]    Scheduled Meds: . gabapentin  300 mg Oral TID  . pantoprazole (PROTONIX) IV  40 mg Intravenous Q24H  . [START ON 11/12/2019] rivaroxaban  20 mg Oral Q supper   Infusions: . sodium chloride    . ceFEPime (MAXIPIME) IV 2 g (11/11/19 0511)  . metronidazole 500 mg (11/11/19 0620)  . vancomycin     PRN Meds: acetaminophen **OR** acetaminophen, ondansetron (ZOFRAN) IV, traMADol   Allergies as of 11/09/2019 - Review Complete 10/30/2019  Allergen Reaction Noted  . Morphine and related Swelling 04/11/2019  . Other Hives and Swelling 03/09/2016    Family History  Problem Relation Age of Onset  . Congestive Heart Failure Father   . Hypertension Mother   . Hyperlipidemia Mother   . Stroke Mother     Social History   Socioeconomic History  . Marital status: Single    Spouse name: Not on file  . Number of children: 1  . Years of education: Not on file  . Highest education level: GED or equivalent  Occupational History  . Not on file  Tobacco Use  . Smoking status: Current Every Day Smoker    Packs/day: 1.00    Years: 24.00    Pack years: 24.00    Types: Cigarettes  . Smokeless  tobacco: Never Used  . Tobacco comment: notified primary care provider and CCM RNCM  Substance and Sexual Activity  . Alcohol use: Yes    Comment: former use - heavy drinker 8 years ago  . Drug use: No  . Sexual activity: Yes  Other Topics Concern  . Not on file  Social History Narrative  . Not on file   Social Determinants of Health   Financial Resource Strain: High Risk  . Difficulty of  Paying Living Expenses: Very hard  Food Insecurity: No Food Insecurity  . Worried About Charity fundraiser in the Last Year: Never true  . Ran Out of Food in the Last Year: Never true  Transportation Needs: No Transportation Needs  . Lack of Transportation (Medical): No  . Lack of Transportation (Non-Medical): No  Physical Activity:   . Days of Exercise per Week: Not on file  . Minutes of Exercise per Session: Not on file  Stress: Stress Concern Present  . Feeling of Stress : To some extent  Social Connections:   . Frequency of Communication with Friends and Family: Not on file  . Frequency of Social Gatherings with Friends and Family: Not on file  . Attends Religious Services: Not on file  . Active Member of Clubs or Organizations: Not on file  . Attends Archivist Meetings: Not on file  . Marital Status: Not on file  Intimate Partner Violence:   . Fear of Current or Ex-Partner: Not on file  . Emotionally Abused: Not on file  . Physically Abused: Not on file  . Sexually Abused: Not on file    REVIEW OF SYSTEMS: Constitutional: Feels tired, weak. ENT:  No nose bleeds Pulm: Mostly nonproductive but loose cough. CV:  No palpitations, no LE edema.  GU:  No hematuria, no frequency GI: See HPI. Heme: Denies unusual bleeding or bruising. Transfusions: None ever, none found in epic. Neuro:  No headaches, no peripheral tingling or numbness Derm:  No itching, no rash or sores.  Endocrine:  No sweats or chills.  No polyuria or dysuria Immunization: Reviewed.  I do not see any  vaccinations since Tdap in 2018. Travel:  None beyond local counties in last few months.    PHYSICAL EXAM: Vital signs in last 24 hours: Vitals:   11/10/19 2246 11/11/19 0514  BP: (!) 131/92 123/77  Pulse: (!) 58 75  Resp: 18 18  Temp: 99.8 F (37.7 C) 99.2 F (37.3 C)  SpO2: 100% 100%   Wt Readings from Last 3 Encounters:  11/11/19 68.4 kg  04/11/19 68 kg  01/08/19 69.9 kg    General: Thin, somewhat chronically ill and malnourished looking AAM.  Resting comfortably in bed. Head: No facial asymmetry or swelling.  No signs of head trauma. Eyes: No scleral icterus, no conjunctival pallor.  EOMI. Ears: No hearing deficit Nose: No congestion or discharge. Mouth: Oral mucosa moist, pink.  Some missing teeth but remaining teeth in good repair.  Tongue midline and displays nonspecific white coating not specifically looking like Candida though.. Neck: No JVD, no masses, no thyromegaly. Lungs: Clear bilaterally.  No labored breathing or cough. Heart: RRR.  No MRG.  S1, S2 present. Abdomen: Soft.  Minimally tender in the mid abdomen.  No masses, HSM, bruits, hernias.  Bowel sounds reduced but normal quality..   Rectal: Deferred Musc/Skeltl: No joint redness, swelling or gross deformity. Extremities: Feet are warm.  There is no peripheral edema. Neurologic: Oriented x3.  Alert.  No tremors or limb weakness. Skin: No sores, no rashes. Tattoos: On the arms.  These were installed by a professional tattoo artist. Nodes: No cervical adenopathy Psych: Quiet, affect flat, cooperative.  Speech fluid.  Intake/Output from previous day: 01/24 0701 - 01/25 0700 In: 820 [P.O.:120; IV Piggyback:700] Out: 400 [Urine:400] Intake/Output this shift: Total I/O In: 0  Out: 400 [Urine:400]  LAB RESULTS: Recent Labs    11/09/19 1946 11/10/19 0242 11/11/19 0138  WBC 23.4* 21.5* 12.0*  HGB 16.7 15.5 15.3  HCT 49.7 46.0 44.3  PLT 406* 351 318   BMET Lab Results  Component Value Date   NA  139 11/11/2019   NA 143 11/10/2019   NA 137 11/09/2019   K 3.8 11/11/2019   K 4.4 11/10/2019   K 3.3 (L) 11/09/2019   CL 104 11/11/2019   CL 106 11/10/2019   CL 101 11/09/2019   CO2 21 (L) 11/11/2019   CO2 20 (L) 11/10/2019   CO2 17 (L) 11/09/2019   GLUCOSE 98 11/11/2019   GLUCOSE 121 (H) 11/10/2019   GLUCOSE 172 (H) 11/09/2019   BUN 16 11/11/2019   BUN 11 11/10/2019   BUN 9 11/09/2019   CREATININE 0.99 11/11/2019   CREATININE 1.15 11/10/2019   CREATININE 1.31 (H) 11/09/2019   CALCIUM 9.5 11/11/2019   CALCIUM 9.8 11/10/2019   CALCIUM 10.5 (H) 11/09/2019   LFT Recent Labs    11/09/19 1946 11/10/19 0242 11/11/19 0138  PROT 8.3* 7.6 7.7  ALBUMIN 4.8 4.3 4.4  AST 24 26 26   ALT 20 18 20   ALKPHOS 61 52 46  BILITOT 0.4 0.4 0.9   PT/INR Lab Results  Component Value Date   INR 1.1 11/09/2019   INR 1.2 08/24/2019   INR 0.96 08/28/2017   Hepatitis Panel No results for input(s): HEPBSAG, HCVAB, HEPAIGM, HEPBIGM in the last 72 hours. C-Diff No components found for: CDIFF Lipase     Component Value Date/Time   LIPASE 51 11/09/2019 1946   LIPASE 73 01/13/2014 1418    Drugs of Abuse     Component Value Date/Time   LABOPIA NONE DETECTED 11/09/2019 1808   COCAINSCRNUR NONE DETECTED 11/09/2019 1808   COCAINSCRNUR NEGATIVE 01/13/2014 1418   LABBENZ NONE DETECTED 11/09/2019 1808   AMPHETMU NONE DETECTED 11/09/2019 1808   THCU NONE DETECTED 11/09/2019 1808   LABBARB NONE DETECTED 11/09/2019 1808     RADIOLOGY STUDIES: CT Head Wo Contrast  Result Date: 11/09/2019 CLINICAL DATA:  Encephalopathy. EXAM: CT HEAD WITHOUT CONTRAST TECHNIQUE: Contiguous axial images were obtained from the base of the skull through the vertex without intravenous contrast. COMPARISON:  Head CT 08/24/2019 FINDINGS: Brain: Mild motion artifact through the skull base. No intracranial hemorrhage, mass effect, or midline shift. No hydrocephalus. The basilar cisterns are patent. No evidence of  territorial infarct or acute ischemia. No extra-axial or intracranial fluid collection. Vascular: No hyperdense vessel or unexpected calcification. Skull: No fracture or focal lesion. Mild motion artifact through the skull base. Sinuses/Orbits: No acute finding allowing for motion. Other: None. IMPRESSION: Negative head CT allowing for mild skull base motion artifact. Electronically Signed   By: Keith Rake M.D.   On: 11/09/2019 23:17   CT ABDOMEN PELVIS W CONTRAST  Result Date: 11/09/2019 CLINICAL DATA:  Nausea and vomiting abdominal pain.  Sepsis. EXAM: CT ABDOMEN AND PELVIS WITH CONTRAST TECHNIQUE: Multidetector CT imaging of the abdomen and pelvis was performed using the standard protocol following bolus administration of intravenous contrast. CONTRAST:  138mL OMNIPAQUE IOHEXOL 300 MG/ML  SOLN COMPARISON:  12/03/2018 FINDINGS: Lower chest: The lung bases are clear. The heart size is normal. Hepatobiliary: The liver is normal. Normal gallbladder.There is no biliary ductal dilation. Pancreas: Normal contours without ductal dilatation. No peripancreatic fluid collection. Spleen: No splenic laceration or hematoma. Adrenals/Urinary Tract: --Adrenal glands: No adrenal hemorrhage. --Right kidney/ureter: No hydronephrosis or perinephric hematoma. --Left kidney/ureter: No hydronephrosis or perinephric hematoma. --Urinary bladder: There appears to be some mild bladder wall thickening. Stomach/Bowel: --Stomach/Duodenum: No  hiatal hernia or other gastric abnormality. Normal duodenal course and caliber. --Small bowel: No dilatation or inflammation. --Colon: There is liquid stool at the level of the rectum. The colon is relatively decompressed. --Appendix: Not visualized. No right lower quadrant inflammation or free fluid. Vascular/Lymphatic: Normal course and caliber of the major abdominal vessels. There is suggestion of a circumaortic left renal vein, a normal variant. --No retroperitoneal lymphadenopathy. --No  mesenteric lymphadenopathy. --there are few mildly enlarged inguinal lymph nodes. Reproductive: Unremarkable Other: No ascites or free air. The abdominal wall is normal. Musculoskeletal. No acute displaced fractures. IMPRESSION: 1. Possible mild bladder wall thickening. Correlation with urinalysis is recommended. 2. Otherwise, no acute intra-abdominal abnormality detected. Electronically Signed   By: Constance Holster M.D.   On: 11/09/2019 23:15   DG Chest Port 1 View  Result Date: 11/09/2019 CLINICAL DATA:  Fever EXAM: PORTABLE CHEST 1 VIEW COMPARISON:  Radiograph 12/03/2018, CT 12/26/2018 FINDINGS: Some streaky opacities in the lung bases likely reflect areas of atelectasis. There is a left perihilar focus of consolidation with air bronchograms. No pneumothorax. No effusion. The cardiomediastinal contours are unremarkable. No acute osseous or soft tissue abnormality. IMPRESSION: Left perihilar focus of consolidation with air bronchograms could reflect pneumonia or sequela of aspiration. Electronically Signed   By: Lovena Le M.D.   On: 11/09/2019 20:47     IMPRESSION:   *    Nausea, vomiting, diarrhea, mid abdominal pain. CTAP raises question of cystitis.  Urinalysis only shows a few bacteria, 6-10 WBCs but greater than 50 RBCs.  2 days out urinary culture is showing multiple species and suggestion for recollection if felt necessary.  Liquid stool in rectum consistent with his history of diarrhea. ? foodborne illness?   Had not run out of oxycodone so opiate withdrawal not likely.  *    History of bilateral PEs 10/2018.  Variable compliance with Eliquis, restarted and given this AM.    *    Aspiration pneumonia?  Currently on empiric Maxipime and Flagyl.  No currentl resp distress.   WBCs improved.      PLAN:     *   Per Dr Tarri Glenn.  EGD tmrw at 0830.  Skip tmrws AM xarelto, postopone until tmrw afternoon.    *   At pt request, advanced to clear diet.      Azucena Freed  11/11/2019,  12:23 PM Phone [phone]

## 2019-11-11 NOTE — H&P (View-Only) (Signed)
[facility] Consult: 12:23 PM 11/11/2019  LOS: 2 days    Referring Provider: Dr Antonieta Pert   Primary Care Physician:  Jossie Ng Sandrea Matte of [place].  Dismissed from [facility], Dr. Cathlean Cower in 02/2018 (due to discrepancy on reported oxy use). Primary Gastroenterologist:  Dr. Ardis Hughs once 2011.     Reason for Consultation:  N/V/D, abdominal pain.     HPI: Jason [name] is a 39 y.o. male.  PMH: On oxycodone for back pain.  Previous spinal surgery.  Kidney stones.  Renal infarcts.  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  Sickle cell trait.  Anxiety.  Migraine headaches.  .  Seen once in the office by Dr. Ardis Hughs in 2011 for left flank pain.  He had 2 CTs prior to this, the first, noncontrast study, raised question of fluid around his colon but repeat CT 1 week later showed no problems with his colon.  Dr. Ardis Hughs felt that the patient's pain was musculoskeletal in nature and declined patient's request for prescribing narcotics or other prescription analgesics.  Patient provided with PPI samples.  Patient has not taken Eliquis for several days.  Says he does not think he needs it anymore but has not been told by MD that it is okay to discontinue.  Patient presented to the ED for evaluation of confusion.  For 2 or 3 days he had been having nonbloody, dark emesis, periumbilical abdominal pain, loose, nonbloody stools, diaphoresis whenever he vomits..  Had not been taking any meds for this and denies use of NSAIDs other than occasionally.  Also says he had run out of oxycodone.  At baseline he does not have problems with reflux, nausea, abdominal pain, weight loss.  Normally has soft brown stools 2 or 3 times per week. Labs confirmed AKI which  has resolved.  Lactic acidosis which is improving.  LFTs, lipase normal. Not anemic with Hb 15.3 but MCV low at 77.  Iron low at 20, iron saturation low at 8% ferritin 165 WBCs 23.4>> 12. CXR shows pneumonia versus aspiration. COVID-19 negative Head CT negative. 11/09/2019 CTAP revealed possible mild urinary bladder wall thickening and a few, mildly enlarged inguinal lymph nodes.  Gallbladder, biliary tree, liver pancreas, stomach, small bowel, colon unremarkable other than some liquid stool at the rectum.  The abdominal pain has improved but he has persistent nausea though he is wondering if he could have some clear liquids.  Tmax 100 F on HD 2, aftebrile since.   Daily IV Protonix started today.  Maxipime day 2.  Flagyl day 3.  Vancomycin discontinued after 1 dose on 1/23.  Fm Hx negative for colorectal or gastric cancers.  No history of ulcer disease or bleeding disorders. 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 Anxiety   . Back injury   . Cervical disc disease   . Chronic back pain   . Chronic low back pain 03/29/2016  . Family history of  adverse reaction to anesthesia   . GERD (gastroesophageal reflux disease)   . History of kidney stones    per mother- "there was a diagnoses at some point"  . Insomnia   . Lumbar disc disease   . MIGRAINE, COMMON 09/23/2010   Qualifier: Diagnosis of  By: Jenny Reichmann MD, Hunt Oris   . Pneumonia   . Sickle cell trait [facility])     Past Surgical History:  Procedure Laterality Date  . BACK SURGERY    . LUMBAR LAMINECTOMY/DECOMPRESSION MICRODISCECTOMY Bilateral 08/28/2017   Procedure: BILATERAL PARTIAL HEMILAMINECTOMIES L4-5 WITH MICRODISCECTOMY;  Surgeon: Jessy Oto, MD;  Location: [facility];  Service: Orthopedics;  Laterality: Bilateral;  . NO PAST SURGERIES      Prior to Admission medications   Medication Sig Start Date End Date Taking? Authorizing Provider  gabapentin (NEURONTIN) 300 MG  capsule Take 900 mg by mouth daily.    Yes [provider]  ondansetron (ZOFRAN ODT) 4 MG disintegrating tablet Take 1 tablet (4 mg total) by mouth every 8 (eight) hours as needed. Patient taking differently: Take 4 mg by mouth every 8 (eight) hours as needed for nausea or vomiting.  08/24/19  Yes Isla Pence, MD  rivaroxaban (XARELTO) 20 MG TABS tablet Take 1 tablet (20 mg total) by mouth daily with supper. 04/11/19  Yes Minette Brine, FNP  diazepam (VALIUM) 5 MG tablet Take 1 tablet (5 mg total) by mouth 2 (two) times daily. Patient not taking: Reported on 11/10/2019 08/24/19   Isla Pence, MD  meclizine (ANTIVERT) 25 MG tablet Take 1 tablet (25 mg total) by mouth 3 (three) times daily as needed for dizziness. Patient not taking: Reported on 11/10/2019 08/24/19   Isla Pence, MD    Scheduled Meds: . gabapentin  300 mg Oral TID  . pantoprazole (PROTONIX) IV  40 mg Intravenous Q24H  . [START ON 11/12/2019] rivaroxaban  20 mg Oral Q supper   Infusions: . sodium chloride    . ceFEPime (MAXIPIME) IV 2 g (11/11/19 0511)  . metronidazole 500 mg (11/11/19 0620)  . vancomycin     PRN Meds: acetaminophen **OR** acetaminophen, ondansetron (ZOFRAN) IV, traMADol   Allergies as of 11/09/2019 - Review Complete 10/30/2019  Allergen Reaction Noted  . Morphine and related Swelling 04/11/2019  . Other Hives and Swelling 03/09/2016    Family History  Problem Relation Age of Onset  . Congestive Heart Failure Father   . Hypertension Mother   . Hyperlipidemia Mother   . Stroke Mother     Social History   Socioeconomic History  . Marital status: Single    Spouse name: Not on file  . Number of children: 1  . Years of education: Not on file  . Highest education level: GED or equivalent  Occupational History  . Not on file  Tobacco Use  . Smoking status: Current Every Day Smoker    Packs/day: 1.00    Years: 24.00    Pack years: 24.00    Types: Cigarettes  . Smokeless  tobacco: Never Used  . Tobacco comment: notified primary care provider and CCM RNCM  Substance and Sexual Activity  . Alcohol use: Yes    Comment: former use - heavy drinker 8 years ago  . Drug use: No  . Sexual activity: Yes  Other Topics Concern  . Not on file  Social History Narrative  . Not on file   Social Determinants of Health   Financial Resource Strain: High Risk  . Difficulty of  Paying Living Expenses: Very hard  Food Insecurity: No Food Insecurity  . Worried About Charity fundraiser in the Last Year: Never true  . Ran Out of Food in the Last Year: Never true  Transportation Needs: No Transportation Needs  . Lack of Transportation (Medical): No  . Lack of Transportation (Non-Medical): No  Physical Activity:   . Days of Exercise per Week: Not on file  . Minutes of Exercise per Session: Not on file  Stress: Stress Concern Present  . Feeling of Stress : To some extent  Social Connections:   . Frequency of Communication with Friends and Family: Not on file  . Frequency of Social Gatherings with Friends and Family: Not on file  . Attends Religious Services: Not on file  . Active Member of Clubs or Organizations: Not on file  . Attends Archivist Meetings: Not on file  . Marital Status: Not on file  Intimate Partner Violence:   . Fear of Current or Ex-Partner: Not on file  . Emotionally Abused: Not on file  . Physically Abused: Not on file  . Sexually Abused: Not on file    REVIEW OF SYSTEMS: Constitutional: Feels tired, weak. ENT:  No nose bleeds Pulm: Mostly nonproductive but loose cough. CV:  No palpitations, no LE edema.  GU:  No hematuria, no frequency GI: See HPI. Heme: Denies unusual bleeding or bruising. Transfusions: None ever, none found in epic. Neuro:  No headaches, no peripheral tingling or numbness Derm:  No itching, no rash or sores.  Endocrine:  No sweats or chills.  No polyuria or dysuria Immunization: Reviewed.  I do not see any  vaccinations since Tdap in 2018. Travel:  None beyond local counties in last few months.    PHYSICAL EXAM: Vital signs in last 24 hours: Vitals:   11/10/19 2246 11/11/19 0514  BP: (!) 131/92 123/77  Pulse: (!) 58 75  Resp: 18 18  Temp: 99.8 F (37.7 C) 99.2 F (37.3 C)  SpO2: 100% 100%   Wt Readings from Last 3 Encounters:  11/11/19 68.4 kg  04/11/19 68 kg  01/08/19 69.9 kg    General: Thin, somewhat chronically ill and malnourished looking AAM.  Resting comfortably in bed. Head: No facial asymmetry or swelling.  No signs of head trauma. Eyes: No scleral icterus, no conjunctival pallor.  EOMI. Ears: No hearing deficit Nose: No congestion or discharge. Mouth: Oral mucosa moist, pink.  Some missing teeth but remaining teeth in good repair.  Tongue midline and displays nonspecific white coating not specifically looking like Candida though.. Neck: No JVD, no masses, no thyromegaly. Lungs: Clear bilaterally.  No labored breathing or cough. Heart: RRR.  No MRG.  S1, S2 present. Abdomen: Soft.  Minimally tender in the mid abdomen.  No masses, HSM, bruits, hernias.  Bowel sounds reduced but normal quality..   Rectal: Deferred Musc/Skeltl: No joint redness, swelling or gross deformity. Extremities: Feet are warm.  There is no peripheral edema. Neurologic: Oriented x3.  Alert.  No tremors or limb weakness. Skin: No sores, no rashes. Tattoos: On the arms.  These were installed by a professional tattoo artist. Nodes: No cervical adenopathy Psych: Quiet, affect flat, cooperative.  Speech fluid.  Intake/Output from previous day: 01/24 0701 - 01/25 0700 In: 820 [P.O.:120; IV Piggyback:700] Out: 400 [Urine:400] Intake/Output this shift: Total I/O In: 0  Out: 400 [Urine:400]  LAB RESULTS: Recent Labs    11/09/19 1946 11/10/19 0242 11/11/19 0138  WBC 23.4* 21.5* 12.0*  HGB 16.7 15.5 15.3  HCT 49.7 46.0 44.3  PLT 406* 351 318   BMET Lab Results  Component Value Date   NA  139 11/11/2019   NA 143 11/10/2019   NA 137 11/09/2019   K 3.8 11/11/2019   K 4.4 11/10/2019   K 3.3 (L) 11/09/2019   CL 104 11/11/2019   CL 106 11/10/2019   CL 101 11/09/2019   CO2 21 (L) 11/11/2019   CO2 20 (L) 11/10/2019   CO2 17 (L) 11/09/2019   GLUCOSE 98 11/11/2019   GLUCOSE 121 (H) 11/10/2019   GLUCOSE 172 (H) 11/09/2019   BUN 16 11/11/2019   BUN 11 11/10/2019   BUN 9 11/09/2019   CREATININE 0.99 11/11/2019   CREATININE 1.15 11/10/2019   CREATININE 1.31 (H) 11/09/2019   CALCIUM 9.5 11/11/2019   CALCIUM 9.8 11/10/2019   CALCIUM 10.5 (H) 11/09/2019   LFT Recent Labs    11/09/19 1946 11/10/19 0242 11/11/19 0138  PROT 8.3* 7.6 7.7  ALBUMIN 4.8 4.3 4.4  AST 24 26 26   ALT 20 18 20   ALKPHOS 61 52 46  BILITOT 0.4 0.4 0.9   PT/INR Lab Results  Component Value Date   INR 1.1 11/09/2019   INR 1.2 08/24/2019   INR 0.96 08/28/2017   Hepatitis Panel No results for input(s): HEPBSAG, HCVAB, HEPAIGM, HEPBIGM in the last 72 hours. C-Diff No components found for: CDIFF Lipase     Component Value Date/Time   LIPASE 51 11/09/2019 1946   LIPASE 73 01/13/2014 1418    Drugs of Abuse     Component Value Date/Time   LABOPIA NONE DETECTED 11/09/2019 1808   COCAINSCRNUR NONE DETECTED 11/09/2019 1808   COCAINSCRNUR NEGATIVE 01/13/2014 1418   LABBENZ NONE DETECTED 11/09/2019 1808   AMPHETMU NONE DETECTED 11/09/2019 1808   THCU NONE DETECTED 11/09/2019 1808   LABBARB NONE DETECTED 11/09/2019 1808     RADIOLOGY STUDIES: CT Head Wo Contrast  Result Date: 11/09/2019 CLINICAL DATA:  Encephalopathy. EXAM: CT HEAD WITHOUT CONTRAST TECHNIQUE: Contiguous axial images were obtained from the base of the skull through the vertex without intravenous contrast. COMPARISON:  Head CT 08/24/2019 FINDINGS: Brain: Mild motion artifact through the skull base. No intracranial hemorrhage, mass effect, or midline shift. No hydrocephalus. The basilar cisterns are patent. No evidence of  territorial infarct or acute ischemia. No extra-axial or intracranial fluid collection. Vascular: No hyperdense vessel or unexpected calcification. Skull: No fracture or focal lesion. Mild motion artifact through the skull base. Sinuses/Orbits: No acute finding allowing for motion. Other: None. IMPRESSION: Negative head CT allowing for mild skull base motion artifact. Electronically Signed   By: Keith Rake M.D.   On: 11/09/2019 23:17   CT ABDOMEN PELVIS W CONTRAST  Result Date: 11/09/2019 CLINICAL DATA:  Nausea and vomiting abdominal pain.  Sepsis. EXAM: CT ABDOMEN AND PELVIS WITH CONTRAST TECHNIQUE: Multidetector CT imaging of the abdomen and pelvis was performed using the standard protocol following bolus administration of intravenous contrast. CONTRAST:  183mL OMNIPAQUE IOHEXOL 300 MG/ML  SOLN COMPARISON:  12/03/2018 FINDINGS: Lower chest: The lung bases are clear. The heart size is normal. Hepatobiliary: The liver is normal. Normal gallbladder.There is no biliary ductal dilation. Pancreas: Normal contours without ductal dilatation. No peripancreatic fluid collection. Spleen: No splenic laceration or hematoma. Adrenals/Urinary Tract: --Adrenal glands: No adrenal hemorrhage. --Right kidney/ureter: No hydronephrosis or perinephric hematoma. --Left kidney/ureter: No hydronephrosis or perinephric hematoma. --Urinary bladder: There appears to be some mild bladder wall thickening. Stomach/Bowel: --Stomach/Duodenum: No  hiatal hernia or other gastric abnormality. Normal duodenal course and caliber. --Small bowel: No dilatation or inflammation. --Colon: There is liquid stool at the level of the rectum. The colon is relatively decompressed. --Appendix: Not visualized. No right lower quadrant inflammation or free fluid. Vascular/Lymphatic: Normal course and caliber of the major abdominal vessels. There is suggestion of a circumaortic left renal vein, a normal variant. --No retroperitoneal lymphadenopathy. --No  mesenteric lymphadenopathy. --there are few mildly enlarged inguinal lymph nodes. Reproductive: Unremarkable Other: No ascites or free air. The abdominal wall is normal. Musculoskeletal. No acute displaced fractures. IMPRESSION: 1. Possible mild bladder wall thickening. Correlation with urinalysis is recommended. 2. Otherwise, no acute intra-abdominal abnormality detected. Electronically Signed   By: Constance Holster M.D.   On: 11/09/2019 23:15   DG Chest Port 1 View  Result Date: 11/09/2019 CLINICAL DATA:  Fever EXAM: PORTABLE CHEST 1 VIEW COMPARISON:  Radiograph 12/03/2018, CT 12/26/2018 FINDINGS: Some streaky opacities in the lung bases likely reflect areas of atelectasis. There is a left perihilar focus of consolidation with air bronchograms. No pneumothorax. No effusion. The cardiomediastinal contours are unremarkable. No acute osseous or soft tissue abnormality. IMPRESSION: Left perihilar focus of consolidation with air bronchograms could reflect pneumonia or sequela of aspiration. Electronically Signed   By: Lovena Le M.D.   On: 11/09/2019 20:47     IMPRESSION:   *    Nausea, vomiting, diarrhea, mid abdominal pain. CTAP raises question of cystitis.  Urinalysis only shows a few bacteria, 6-10 WBCs but greater than 50 RBCs.  2 days out urinary culture is showing multiple species and suggestion for recollection if felt necessary.  Liquid stool in rectum consistent with his history of diarrhea. ? foodborne illness?   Had not run out of oxycodone so opiate withdrawal not likely.  *    History of bilateral PEs 10/2018.  Variable compliance with Eliquis, restarted and given this AM.    *    Aspiration pneumonia?  Currently on empiric Maxipime and Flagyl.  No currentl resp distress.   WBCs improved.      PLAN:     *   Per Dr Tarri Glenn.  EGD tmrw at 0830.  Skip tmrws AM xarelto, postopone until tmrw afternoon.    *   At pt request, advanced to clear diet.      Azucena Freed  11/11/2019,  12:23 PM Phone [phone]

## 2019-11-11 NOTE — Anesthesia Preprocedure Evaluation (Addendum)
Anesthesia Evaluation  Patient identified by MRN, date of birth, ID band Patient awake    Reviewed: Allergy & Precautions, H&P , NPO status , Patient's Chart, lab work & pertinent test results  Airway Mallampati: III  TM Distance: >3 FB Neck ROM: Full    Dental no notable dental hx. (+) Teeth Intact, Dental Advisory Given   Pulmonary neg pulmonary ROS, Current Smoker and Patient abstained from smoking.,    Pulmonary exam normal breath sounds clear to auscultation       Cardiovascular Exercise Tolerance: Good negative cardio ROS   Rhythm:Regular Rate:Normal     Neuro/Psych  Headaches, Anxiety Depression    GI/Hepatic Neg liver ROS, GERD  ,  Endo/Other  negative endocrine ROS  Renal/GU negative Renal ROS  negative genitourinary   Musculoskeletal   Abdominal   Peds  Hematology negative hematology ROS (+)   Anesthesia Other Findings   Reproductive/Obstetrics negative OB ROS                            Anesthesia Physical Anesthesia Plan  ASA: II  Anesthesia Plan: MAC   Post-op Pain Management:    Induction: Intravenous  PONV Risk Score and Plan: 0 and Propofol infusion  Airway Management Planned: Nasal Cannula  Additional Equipment:   Intra-op Plan:   Post-operative Plan:   Informed Consent: I have reviewed the patients History and Physical, chart, labs and discussed the procedure including the risks, benefits and alternatives for the proposed anesthesia with the patient or authorized representative who has indicated his/her understanding and acceptance.     Dental advisory given  Plan Discussed with: CRNA  Anesthesia Plan Comments:         Anesthesia Quick Evaluation

## 2019-11-12 ENCOUNTER — Inpatient Hospital Stay (HOSPITAL_COMMUNITY): Payer: BLUE CROSS/BLUE SHIELD | Admitting: Anesthesiology

## 2019-11-12 ENCOUNTER — Encounter (HOSPITAL_COMMUNITY): Payer: Self-pay | Admitting: Internal Medicine

## 2019-11-12 ENCOUNTER — Inpatient Hospital Stay (HOSPITAL_COMMUNITY): Payer: BLUE CROSS/BLUE SHIELD

## 2019-11-12 ENCOUNTER — Encounter (HOSPITAL_COMMUNITY)
Admission: EM | Disposition: A | Discharge status: Home / Self Care | Payer: Self-pay | Source: Home / Self Care | Attending: Internal Medicine

## 2019-11-12 DIAGNOSIS — K3189 Other diseases of stomach and duodenum: Secondary | ICD-10-CM

## 2019-11-12 DIAGNOSIS — K2980 Duodenitis without bleeding: Secondary | ICD-10-CM

## 2019-11-12 DIAGNOSIS — K529 Noninfective gastroenteritis and colitis, unspecified: Secondary | ICD-10-CM

## 2019-11-12 DIAGNOSIS — R7881 Bacteremia: Secondary | ICD-10-CM

## 2019-11-12 HISTORY — PX: BIOPSY: SHX5522

## 2019-11-12 HISTORY — PX: ESOPHAGOGASTRODUODENOSCOPY (EGD) WITH PROPOFOL: SHX5813

## 2019-11-12 LAB — CBC
HCT: 39.8 % (ref 39.0–52.0)
Hemoglobin: 13.9 g/dL (ref 13.0–17.0)
MCH: 27.2 pg (ref 26.0–34.0)
MCHC: 34.9 g/dL (ref 30.0–36.0)
MCV: 77.9 fL — ABNORMAL LOW (ref 80.0–100.0)
Platelets: 268 10*3/uL (ref 150–400)
RBC: 5.11 MIL/uL (ref 4.22–5.81)
RDW: 14.5 % (ref 11.5–15.5)
WBC: 10.3 10*3/uL (ref 4.0–10.5)
nRBC: 0 % (ref 0.0–0.2)

## 2019-11-12 LAB — COMPREHENSIVE METABOLIC PANEL
ALT: 17 U/L (ref 0–44)
AST: 18 U/L (ref 15–41)
Albumin: 3.8 g/dL (ref 3.5–5.0)
Alkaline Phosphatase: 38 U/L (ref 38–126)
Anion gap: 10 (ref 5–15)
BUN: 16 mg/dL (ref 6–20)
CO2: 23 mmol/L (ref 22–32)
Calcium: 9 mg/dL (ref 8.9–10.3)
Chloride: 106 mmol/L (ref 98–111)
Creatinine, Ser: 1.22 mg/dL (ref 0.61–1.24)
GFR calc Af Amer: >60 mL/min (ref >60)
GFR calc non Af Amer: >60 mL/min (ref >60)
Glucose, Bld: 97 mg/dL (ref 70–99)
Potassium: 3.8 mmol/L (ref 3.5–5.1)
Sodium: 139 mmol/L (ref 135–145)
Total Bilirubin: 1.6 mg/dL — ABNORMAL HIGH (ref 0.3–1.2)
Total Protein: 6.6 g/dL (ref 6.5–8.1)

## 2019-11-12 LAB — TSH: TSH: 2.664 u[IU]/mL (ref 0.350–4.500)

## 2019-11-12 LAB — ECHOCARDIOGRAM COMPLETE
Height: 68 in
Weight: 2437.41 oz

## 2019-11-12 SURGERY — ESOPHAGOGASTRODUODENOSCOPY (EGD) WITH PROPOFOL
Anesthesia: Monitor Anesthesia Care

## 2019-11-12 MED ORDER — RIVAROXABAN 20 MG PO TABS
20.0000 mg | ORAL_TABLET | Freq: Every day | ORAL | Status: DC
  Administered 2019-11-13: 18:00:00 20 mg via ORAL
  Filled 2019-11-12: qty 1

## 2019-11-12 MED ORDER — PROPOFOL 500 MG/50ML IV EMUL
INTRAVENOUS | Status: DC | PRN
  Administered 2019-11-12: 150 ug/kg/min via INTRAVENOUS

## 2019-11-12 MED ORDER — LACTATED RINGERS IV SOLN
INTRAVENOUS | Status: AC | PRN
  Administered 2019-11-12: 1000 mL via INTRAVENOUS

## 2019-11-12 MED ORDER — PROPOFOL 10 MG/ML IV BOLUS
INTRAVENOUS | Status: DC | PRN
  Administered 2019-11-12 (×3): 25 mg via INTRAVENOUS
  Administered 2019-11-12: 30 mg via INTRAVENOUS

## 2019-11-12 MED ORDER — AMOXICILLIN-POT CLAVULANATE 875-125 MG PO TABS
1.0000 | ORAL_TABLET | Freq: Two times a day (BID) | ORAL | Status: DC
  Administered 2019-11-12 – 2019-11-14 (×5): 1 via ORAL
  Filled 2019-11-12 (×5): qty 1

## 2019-11-12 MED ORDER — VANCOMYCIN HCL 750 MG/150ML IV SOLN
750.0000 mg | Freq: Two times a day (BID) | INTRAVENOUS | Status: DC
  Filled 2019-11-12: qty 150

## 2019-11-12 MED ORDER — PANTOPRAZOLE SODIUM 40 MG IV SOLR
40.0000 mg | Freq: Two times a day (BID) | INTRAVENOUS | Status: DC
  Administered 2019-11-12 – 2019-11-13 (×2): 40 mg via INTRAVENOUS
  Filled 2019-11-12 (×2): qty 40

## 2019-11-12 SURGICAL SUPPLY — 15 items

## 2019-11-12 NOTE — Anesthesia Postprocedure Evaluation (Signed)
Anesthesia Post Note  Patient: Rulon Abdalla  Procedure(s) Performed: ESOPHAGOGASTRODUODENOSCOPY (EGD) WITH PROPOFOL (N/A ) BIOPSY     Patient location during evaluation: Endoscopy Anesthesia Type: MAC Level of consciousness: awake and alert Pain management: pain level controlled Vital Signs Assessment: post-procedure vital signs reviewed and stable Respiratory status: spontaneous breathing, nonlabored ventilation and respiratory function stable Cardiovascular status: stable and blood pressure returned to baseline Postop Assessment: no apparent nausea or vomiting Anesthetic complications: no    Last Vitals:  Vitals:   11/12/19 0910 11/12/19 0920  BP: (!) 141/81 (!) 142/83  Pulse: (!) 44 (!) 47  Resp: (!) 21 19  Temp:    SpO2: 100% 100%    Last Pain:  Vitals:   11/12/19 0920  TempSrc:   PainSc: 0-No pain                 Tirth Cothron,W. EDMOND

## 2019-11-12 NOTE — Progress Notes (Addendum)
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  CT abdomen pelvis with contrast unremarkable chest x-ray concerning for pneumonia versus aspiration.  Patient admitted for further management.  Patient continued to have ongoing nausea abdominal pain, seen by GI underwent EGD 1/26  Subjective:  Seen after endoscopy this morning feeling nauseated.  No fever or chills.    Assessment & Plan:  Intractable nausea and vomiting and abdominal pain:?Gastroenteritis/food borne illness.  CT abd pelvis with contrast no acute finding except mild bladder wall thickening.  His lfts and lipase are unremarkable.  Seen by GI appreciate input-see EGD done this morning below.  Cont PPI.  Starting clear liquid diet and advance as tolerated.  EGD "Normal esophagus. - Small hiatal hernia. - Normal stomach. Biopsied. - Mild erythematous duodenopathy. Biopsied. - No obvious source for recent symptoms. Awaiting gastric and duodenal biopsies. Impression: - Return patient to hospital ward for ongoing care. - Advance diet as tolerated. - Continue present medications including pantoprazole 40 mg BID. Convert to PO when tolerating oral intake. - Avoid NSAIDs as able. - Await pathology results. - Resume Xarelto tomorrow"  Jason [name] also mildly dizzy: check TSH EKG and echocardiogram.  Monitor electrolytes.  Monitor on telemetry.  Sepsis suspected, with imaging concerning for pneumonia community-acquired: COVID-19 negative, blood culture negative since admission, urine culture multiple species.  WBC nicely improved.  We will switch him to Augmentin orally- D/C Flagyl cefepime and  vancomycin.  Lactic acidosis likely from #1 and #2- resolved with ivf.  Suspect UTI urine culture multiple species  Diarrhea: No recurrence of diarrhea, C. 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   AKI: Resolved.  Hypokalemia resolved  Chronic Back pain on a chronic oxycodone: It seems he was switched to Suboxone by his pain management doctor in December but patient reports he has not been using Suboxone as its not working for him and has been using his mother's oxycodone.  Reports he was supposed to see pain doctor today. Pain no relived by tramadol, add Low-dose oxycodone.  Body mass index is 23.16 kg/m.   DVT prophylaxis:Xareelto Code Status:FULL Family Communication: plan of care discussed with patient at bedside. Disposition Plan: Remains inpatient pending ability to tolerate diet.  Home soon once he tolerates diet Starting CLD  Consultants:GI Procedures:see note- egd  Microbiology: Antimicrobials: Anti-infectives (From admission, onward)   Start     Dose/Rate Route Frequency Ordered Stop   11/12/19 1200  vancomycin (VANCOREADY) IVPB 750 mg/150 mL     750 mg 150 mL/hr over 60 Minutes Intravenous Every 12 hours 11/12/19 1103     11/11/19 1200  vancomycin (VANCOREADY) IVPB 750 mg/150 mL  Status:  Discontinued     750 mg 150 mL/hr over 60 Minutes Intravenous Every 12 hours 11/10/19 0012 11/11/19 1112   11/11/19 1200  vancomycin (VANCOCIN) IVPB 1000 mg/200 mL premix  Status:  Discontinued     1,000 mg 200 mL/hr over 60 Minutes Intravenous Every 12 hours 11/11/19 1112 11/12/19 1103   11/11/19 0600  ceFEPIme (MAXIPIME) 2 g in sodium chloride 0.9 % 100 mL IVPB  2 g 200 mL/hr over 30 Minutes Intravenous Every 8 hours 11/10/19 0012     11/10/19 0600  metroNIDAZOLE (FLAGYL) IVPB 500 mg     500 mg 100 mL/hr over 60 Minutes Intravenous Every 8  hours 11/10/19 0013     11/09/19 2100  vancomycin (VANCOREADY) IVPB 1250 mg/250 mL     1,250 mg 166.7 mL/hr over 90 Minutes Intravenous  Once 11/09/19 2023 11/10/19 0031   11/09/19 2015  ceFEPIme (MAXIPIME) 2 g in sodium chloride 0.9 % 100 mL IVPB     2 g 200 mL/hr over 30 Minutes Intravenous  Once 11/09/19 2012 11/09/19 2221   11/09/19 2015  metroNIDAZOLE (FLAGYL) IVPB 500 mg     500 mg 100 mL/hr over 60 Minutes Intravenous  Once 11/09/19 2012 11/10/19 0030   11/09/19 2015  vancomycin (VANCOCIN) IVPB 1000 mg/200 mL premix  Status:  Discontinued     1,000 mg 200 mL/hr over 60 Minutes Intravenous  Once 11/09/19 2012 11/09/19 2023      Medications: Scheduled Meds: . gabapentin  300 mg Oral TID  . pantoprazole (PROTONIX) IV  40 mg Intravenous Q12H  . [START ON 11/13/2019] rivaroxaban  20 mg Oral Q supper   Continuous Infusions: . sodium chloride 75 mL/hr at 11/12/19 0957  . ceFEPime (MAXIPIME) IV 200 mL/hr at 11/12/19 0609  . metronidazole 500 mg (11/12/19 [id number])  . vancomycin      Objective: Vitals:   11/12/19 0900 11/12/19 0910 11/12/19 0920 11/12/19 0952  BP: 105/75 (!) 141/81 (!) 142/83 123/72  Pulse: (!) 58 (!) 44 (!) 47 (!) 50  Resp: (!) 25 (!) 21 19 19   Temp: 97.9 F (36.6 C)   99 F (37.2 C)  TempSrc: Oral   Oral  SpO2: 99% 100% 100%   Weight:      Height:        Intake/Output Summary (Last 24 hours) at 11/12/2019 1130 Last data filed at 11/12/2019 [id number] Gross per 24 hour  Intake 2554.98 ml  Output 1025 ml  Net 1529.98 ml   Filed Weights   11/11/19 0500 11/12/19 0500 11/12/19 0800  Weight: 68.4 kg 69.1 kg 69.1 kg   Weight change: 0.7 kg  Body mass index is 23.16 kg/m.  Intake/Output from previous day: 01/25 0701 - 01/26 0700 In: 2255 [P.O.:270; I.V.:829.6; IV Piggyback:1155.3] Out: 1425 [Urine:1425] Intake/Output this shift: Total I/O In: 300 [I.V.:300] Out: -   Examination:  General exam: AAOx3, nausea.  Ill looking, on room air.  Older than  stated age. HEENT:Oral mucosa moist, Ear/Nose WNL grossly, dentition normal. Respiratory system: Bilaterally clear breath sounds, no wheezing or crackles.   Cardiovascular system: S1 & S2 +, No JVD,. Gastrointestinal system: Abdomen soft, mild tenderness in the central abdomen,ND, BS+ Nervous System:Alert, awake, moving extremities and grossly nonfocal Extremities: No edema, distal peripheral pulses palpable.  Skin: No rashes,no icterus. MSK: Normal muscle bulk,tone, power tattoos +   Data Reviewed: I have personally reviewed following labs and imaging studies  CBC: Recent Labs  Lab 11/09/19 1946 11/10/19 0242 11/11/19 0138 11/12/19 0152  WBC 23.4* 21.5* 12.0* 10.3  NEUTROABS 19.7*  --  9.2*  --   HGB 16.7 15.5 15.3 13.9  HCT 49.7 46.0 44.3 39.8  MCV 80.9 80.7 77.4* 77.9*  PLT 406* 351 318 [id number]   Basic Metabolic Panel: Recent Labs  Lab 11/09/19 1946 11/10/19 0242 11/11/19 0138 11/12/19 0152  NA 137 143 139 139  K 3.3* 4.4 3.8 3.8  CL 101 106  104 106  CO2 17* 20* 21* 23  GLUCOSE 172* 121* 98 97  BUN 9 11 16 16   CREATININE 1.31* 1.15 0.99 1.22  CALCIUM 10.5* 9.8 9.5 9.0   GFR: Estimated Creatinine Clearance: 79.4 mL/min (by C-G formula based on SCr of 1.22 mg/dL). Liver Function Tests: Recent Labs  Lab 11/09/19 1946 11/10/19 0242 11/11/19 0138 11/12/19 0152  AST 24 26 26 18   ALT 20 18 20 17   ALKPHOS 61 52 46 38  BILITOT 0.4 0.4 0.9 1.6*  PROT 8.3* 7.6 7.7 6.6  ALBUMIN 4.8 4.3 4.4 3.8   Recent Labs  Lab 11/09/19 1946  LIPASE 51   No results for input(s): AMMONIA in the last 168 hours. Coagulation Profile: Recent Labs  Lab 11/09/19 2303  INR 1.1   Cardiac Enzymes: No results for input(s): CKTOTAL, CKMB, CKMBINDEX, TROPONINI in the last 168 hours. BNP (last 3 results) No results for input(s): PROBNP in the last 8760 hours. HbA1C: No results for input(s): HGBA1C in the last 72 hours. CBG: Recent Labs  Lab 11/09/19 1745  GLUCAP 117*   Lipid  Profile: No results for input(s): CHOL, HDL, LDLCALC, TRIG, CHOLHDL, LDLDIRECT in the last 72 hours. Thyroid Function Tests: No results for input(s): TSH, T4TOTAL, FREET4, T3FREE, THYROIDAB in the last 72 hours. Anemia Panel: No results for input(s): VITAMINB12, FOLATE, FERRITIN, TIBC, IRON, RETICCTPCT in the last 72 hours. Sepsis Labs: Recent Labs  Lab 11/09/19 1902 11/09/19 1946  LATICACIDVEN 8.0* 5.9*    Recent Results (from the past 240 hour(s))  Blood culture (routine x 2)     Status: None (Preliminary result)   Collection Time: 11/09/19  8:21 PM   Specimen: BLOOD  Result Value Ref Range Status   Specimen Description BLOOD RIGHT ANTECUBITAL  Final   Special Requests   Final    BOTTLES DRAWN AEROBIC AND ANAEROBIC Blood Culture adequate volume   Culture   Final    NO GROWTH 3 DAYS Performed at [facility], [place] [address] [place], [place] [address]    Report Status PENDING  Incomplete  Urine culture     Status: Abnormal   Collection Time: 11/09/19  8:58 PM   Specimen: In/Out Cath Urine  Result Value Ref Range Status   Specimen Description IN/OUT CATH URINE  Final   Special Requests   Final    NONE Performed at [facility], [place] [address], [place], [place] [address]    Culture MULTIPLE SPECIES PRESENT, SUGGEST RECOLLECTION (A)  Final   Report Status 11/11/2019 FINAL  Final  SARS CORONAVIRUS 2 (TAT 6-24 HRS)     Status: None   Collection Time: 11/10/19 12:31 AM  Result Value Ref Range Status   SARS Coronavirus 2 NEGATIVE NEGATIVE Final    Comment: (NOTE) SARS-CoV-2 target nucleic acids are NOT DETECTED. The SARS-CoV-2 RNA is generally detectable in upper and lower respiratory specimens during the acute phase of infection. Negative results do not preclude SARS-CoV-2 infection, do not rule out co-infections with other pathogens, and should not be used as the sole basis for treatment or other patient management decisions. Negative results must be combined  with clinical observations, patient history, and epidemiological information. The expected result is Negative. Fact Sheet for Patients: SugarRoll.be Fact Sheet for Healthcare Providers: https://www.woods-mathews.com/ This test is not yet approved or cleared by the [place] FDA and  has been authorized for detection and/or diagnosis of SARS-CoV-2 by FDA under an Emergency Use Authorization (EUA). This EUA will  remain  in effect (meaning this test can be used) for the duration of the COVID-19 declaration under Section 56 4(b)(1) of the Act, 21 U.S.C. section 360bbb-3(b)(1), unless the authorization is terminated or revoked sooner. Performed at [facility], [place] [address], [place], [place] [address]       Radiology Studies: No results found.    LOS: 3 days   Time spent: More than 50% of that time was spent in counseling and/or coordination of care.  Antonieta Pert, MD [facility]  11/12/2019, 11:30 AM

## 2019-11-12 NOTE — Anesthesia Procedure Notes (Signed)
Procedure Name: MAC Date/Time: 11/12/2019 8:43 AM Performed by: Imagene Riches, CRNA Pre-anesthesia Checklist: Patient identified, Emergency Drugs available, Suction available, Patient being monitored and Timeout performed Patient Re-evaluated:Patient Re-evaluated prior to induction Oxygen Delivery Method: Nasal cannula

## 2019-11-12 NOTE — Progress Notes (Signed)
  Echocardiogram 2D Echocardiogram has been performed.  Danner Paulding A Takia Runyon 11/12/2019, 4:38 PM

## 2019-11-12 NOTE — Transfer of Care (Signed)
Immediate Anesthesia Transfer of Care Note  Patient: Firmin Belisle  Procedure(s) Performed: ESOPHAGOGASTRODUODENOSCOPY (EGD) WITH PROPOFOL (N/A ) BIOPSY  Patient Location: Endoscopy Unit  Anesthesia Type:MAC  Level of Consciousness: drowsy  Airway & Oxygen Therapy: Patient Spontanous Breathing and Patient connected to nasal cannula oxygen  Post-op Assessment: Report given to RN and Post -op Vital signs reviewed and stable  Post vital signs: Reviewed and stable  Last Vitals:  Vitals Value Taken Time  BP 141/81 11/12/19 0910  Temp 36.6 C 11/12/19 0900  Pulse 45 11/12/19 0914  Resp 18 11/12/19 0914  SpO2 95 % 11/12/19 0914  Vitals shown include unvalidated device data.  Last Pain:  Vitals:   11/12/19 0910  TempSrc:   PainSc: 0-No pain      Patients Stated Pain Goal: 2 (84/21/03 1281)  Complications: No apparent anesthesia complications

## 2019-11-12 NOTE — Interval H&P Note (Signed)
History and Physical Interval Note:  11/12/2019 8:31 AM  Jason [name]  has presented today for surgery, with the diagnosis of abdominal pain, nausea,  vomiting.  The various methods of treatment have been discussed with the patient and family. After consideration of risks, benefits and other options for treatment, the patient has consented to  Procedure(s): ESOPHAGOGASTRODUODENOSCOPY (EGD) WITH PROPOFOL (N/A) as a surgical intervention.  [name] history has been reviewed, patient examined, no change in status, stable for surgery.  I have reviewed the patient's chart and labs.  Questions were answered to the patient's satisfaction.     Thornton Park

## 2019-11-12 NOTE — Progress Notes (Signed)
          Daily Rounding Note  11/12/2019, 12:39 PM  LOS: 3 days   SUBJECTIVE:   Chief complaint: Nausea, vomiting, diarrhea, abdominal pain.   CT suggesting cystitis. Tolerated solid diet last night for dinner.  Has not eaten his breakfast yet because he does not like the hospital food.  Had 1 loose stool yesterday, 1 loose, brown stool this morning.  No significant abdominal pain.  No nausea.  OBJECTIVE:         Vital signs in last 24 hours:    Temp:  [97.9 F (36.6 C)-99.3 F (37.4 C)] 99 F (37.2 C) (01/26 0952) Pulse Rate:  [44-58] 50 (01/26 0952) Resp:  [11-25] 19 (01/26 0952) BP: (105-142)/(72-84) 123/72 (01/26 0952) SpO2:  [99 %-100 %] 100 % (01/26 0920) Weight:  [69.1 kg] 69.1 kg (01/26 0800) Last BM Date: 11/11/19 Filed Weights   11/11/19 0500 11/12/19 0500 11/12/19 0800  Weight: 68.4 kg 69.1 kg 69.1 kg   General: Looks well.  Comfortable.  Alert. Heart: RRR. Chest: Clear bilaterally. Abdomen: Soft.  Not tender not distended.  Active bowel sounds. Extremities: No CCE. Neuro/Psych: Alert.  Fluent speech.  Fully oriented.  Intake/Output from previous day: 01/25 0701 - 01/26 0700 In: 2255 [P.O.:270; I.V.:829.6; IV Piggyback:1155.3] Out: 1425 [Urine:1425]  Intake/Output this shift: Total I/O In: 300 [I.V.:300] Out: -   Lab Results: Recent Labs    11/10/19 0242 11/11/19 0138 11/12/19 0152  WBC 21.5* 12.0* 10.3  HGB 15.5 15.3 13.9  HCT 46.0 44.3 39.8  PLT 351 318 268   BMET Recent Labs    11/10/19 0242 11/11/19 0138 11/12/19 0152  NA 143 139 139  K 4.4 3.8 3.8  CL 106 104 106  CO2 20* 21* 23  GLUCOSE 121* 98 97  BUN 11 16 16   CREATININE 1.15 0.99 1.22  CALCIUM 9.8 9.5 9.0   LFT Recent Labs    11/10/19 0242 11/11/19 0138 11/12/19 0152  PROT 7.6 7.7 6.6  ALBUMIN 4.3 4.4 3.8  AST 26 26 18   ALT 18 20 17   ALKPHOS 52 46 38  BILITOT 0.4 0.9 1.6*   PT/INR Recent Labs    11/09/19 2303   LABPROT 13.9  INR 1.1   Hepatitis Panel No results for input(s): HEPBSAG, HCVAB, HEPAIGM, HEPBIGM in the last 72 hours.  Studies/Results: No results found.   Scheduled Meds: . amoxicillin-clavulanate  1 tablet Oral Q12H  . gabapentin  300 mg Oral TID  . [START ON 11/15/2019] influenza vac split quadrivalent PF  0.5 mL Intramuscular Tomorrow-1000  . pantoprazole  40 mg Oral BID AC  . rivaroxaban  20 mg Oral Q supper   Continuous Infusions: . sodium chloride 100 mL/hr at 11/14/19 0028   PRN Meds:.acetaminophen **OR** acetaminophen, ondansetron (ZOFRAN) IV, oxyCODONE, traMADol, zolpidem   ASSESMENT:   *    Abdominal pain, nausea vomiting, diarrhea.  Significantly improved clinically and resolved leukocytosis with antibiotics, currently Augmentin. 11/09/2019 CTAP showed possible mild urinary bladder wall thickening and a few mildly enlarged inguinal lymph nodes.  Otherwise CT was normal. 11/11/2019 EGD.  Mild duodenal erythema, small HH, otherwise normal.  Findings do not explain patient's complaints.  Awaiting gastric and duodenal biopsies.  *    Chronic Eliquis.   PLAN   *   No plans for further investigation.  No need for GI follow-up.    Jason [name]  11/12/2019, 12:39 PM Phone [phone]

## 2019-11-12 NOTE — Op Note (Signed)
[facility] Patient Name: Jason [name] Procedure Date : 11/12/2019 MRN: [id number] Attending MD: Thornton Park MD, MD Date of Birth: [birth year] CSN: [id number] Age: 39 Admit Type: Inpatient Procedure:                Upper GI endoscopy Indications:              Generalized abdominal pain, Nausea with vomiting Providers:                Thornton Park MD, MD, Corie Chiquito, Technician,                            Baird Cancer, RN, Lazaro Arms, Technician, Imagene Riches, CRNA Referring MD:              Medicines:                Monitored Anesthesia Care Complications:            No immediate complications. Estimated blood loss:                            Minimal. Estimated Blood Loss:     Estimated blood loss was minimal. Procedure:                Pre-Anesthesia Assessment:                           - Prior to the procedure, a History and Physical                            was performed, and patient medications and                            allergies were reviewed. [name] tolerance of                            previous anesthesia was also reviewed. The risks                            and benefits of the procedure and the sedation                            options and risks were discussed with the patient.                            All questions were answered, and informed consent                            was obtained. Prior Anticoagulants: The patient has                            taken Xarelto (rivaroxaban), last dose was 1 day  prior to procedure after being off Xarelto for                            several days. ASA Grade Assessment: III - A patient                            with severe systemic disease. After reviewing the                            risks and benefits, the patient was deemed in                            satisfactory condition to undergo the procedure.  After obtaining informed consent, the endoscope was                            passed under direct vision. Throughout the                            procedure, the patient's blood pressure, pulse, and                            oxygen saturations were monitored continuously. The                            [id number]) Olympus gastroscope was                            introduced through the mouth, and advanced to the                            third part of duodenum. The upper GI endoscopy was                            accomplished without difficulty. The patient                            tolerated the procedure well. Scope In: Scope Out: Findings:      The esophagus was normal.      A small hiatal hernia was present.      The entire examined stomach was normal. Biopsies were taken from the       antrum, body, and fundus with a cold forceps for histology. Estimated       blood loss was minimal.      Diffuse mildly erythematous mucosa without active bleeding and with no       stigmata of bleeding was found in the duodenal bulb. Biopsies were taken       with a cold forceps for histology. Estimated blood loss was minimal.      The cardia and gastric fundus were normal on retroflexion.      The exam was otherwise without abnormality. Impression:               - Normal esophagus.                           -  Small hiatal hernia.                           - Normal stomach. Biopsied.                           - Mild erythematous duodenopathy. Biopsied.                           - No obvious source for recent symptoms. Awaiting                            gastric and duodenal biopsies. Recommendation:           - Return patient to hospital ward for ongoing care.                           - Advance diet as tolerated.                           - Continue present medications including                            pantoprazole 40 mg BID. Convert to PO when                             tolerating oral intake.                           - Avoid NSAIDs as able.                           - Await pathology results.                           - Resume Xarelto tomorrow. Procedure Code(s):        --- Professional ---                           [phone], Esophagogastroduodenoscopy, flexible,                            transoral; with biopsy, single or multiple Diagnosis Code(s):        --- Professional ---                           K44.9, Diaphragmatic hernia without obstruction or                            gangrene                           K31.89, Other diseases of stomach and duodenum                           R10.84, Generalized abdominal pain  R11.2, Nausea with vomiting, unspecified CPT copyright 2019 American Medical Association. All rights reserved. The codes documented in this report are preliminary and upon coder review may  be revised to meet current compliance requirements. Thornton Park MD, MD 11/12/2019 9:15:58 AM This report has been signed electronically. Number of Addenda: 0

## 2019-11-12 NOTE — Progress Notes (Signed)
EGD Procedure note available in EPIC for full details.  Patient's Mom updated about the findings and recommendations by phone. All questions answered to her satisfaction. She was appreciative for the call.  Updated Dr. Lupita Leash. Will hold Xarelto until tomorrow. Diet advance as tolerated. Convert pantoprazole to PO when his nausea and vomiting improve. Awaiting gastric and duodenal biopsies.   We will continue to follow with you.

## 2019-11-12 NOTE — Progress Notes (Addendum)
Pharmacy Antibiotic and Anticoagulation Note  Jason [name] is a 39 y.o. male admitted on 11/09/2019 on day # Vancomycin, Cefepime and Metronidazole for empiric coverage for sepsis, pneumonia and UTI.   Creatinine improved from 1.31 > 0.99 on 1/25 but back to 1.22 today.  On NS at 75 ml/hr   Xarelto 20 mg daily as PTA for hx PE 11/2018 resumed on 1/24. Held today for EGD and to resume on 1/27 per GI.  Plan: Decrease empiric vancomycin back to 750 mg IV q12h. Goal AUC 400-550. Expected AUC: 429 SCr used: 1.22 Continue Cefepime 2gm IV q8h. Also on Metronidazole 500 mg IV q8h Follow renal function, culture data, clinical progress and antibiotic plans.  - No Xarelto today, resuming on 1/27 per GI.  Protonix increased to BID.   Height: 5\' 8"  (172.7 cm) Weight: 152 lb 5.4 oz (69.1 kg) IBW/kg (Calculated) : 68.4  Temp (24hrs), Avg:98.7 F (37.1 C), Min:97.9 F (36.6 C), Max:99.3 F (37.4 C)  Recent Labs  Lab 11/09/19 1902 11/09/19 1946 11/10/19 0242 11/11/19 0138 11/12/19 0152  WBC  --  23.4* 21.5* 12.0* 10.3  CREATININE  --  1.31* 1.15 0.99 1.22  LATICACIDVEN 8.0* 5.9*  --   --   --     Estimated Creatinine Clearance: 79.4 mL/min (by C-G formula based on SCr of 1.22 mg/dL).    Allergies  Allergen Reactions  . Morphine And Related Swelling  . Other Hives and Swelling    Antibiotic that was given when he had pneumonia     Antimicrobials this admission: Vancomycin 1/23 >> Cefepime 1/23 >> Metronidazole 1/23 >>  Dose adjustments this admission:  1/25: empiric Vanc incr 750 mg > 1gm IV q12h for improved renal fxn  1/26: Scr back to 1.22, dose decr back to 750 mg IV q12h  Microbiology results: 1/23 blood x 1: no growth x 3 days to date 1/24 Covid: negative 1/23 urine: multiple species, suggest recollect 1/24 C diff: no sample yet 1/24 Strep pneumo ag: negative 1/24 Legionella ag: negative  Thank you for allowing pharmacy to be a part of this patient's  care.  Arty Baumgartner, [place] Phone: [phone] 11/12/2019 11:03 AM

## 2019-11-13 ENCOUNTER — Other Ambulatory Visit: Payer: Self-pay | Admitting: Physician Assistant

## 2019-11-13 ENCOUNTER — Encounter: Payer: Self-pay | Admitting: *Deleted

## 2019-11-13 ENCOUNTER — Inpatient Hospital Stay (HOSPITAL_COMMUNITY): Payer: BLUE CROSS/BLUE SHIELD

## 2019-11-13 ENCOUNTER — Telehealth: Payer: Self-pay | Admitting: *Deleted

## 2019-11-13 DIAGNOSIS — K2980 Duodenitis without bleeding: Secondary | ICD-10-CM

## 2019-11-13 MED ORDER — PANTOPRAZOLE SODIUM 40 MG PO TBEC
40.0000 mg | DELAYED_RELEASE_TABLET | Freq: Two times a day (BID) | ORAL | Status: DC
  Administered 2019-11-13 – 2019-11-14 (×2): 40 mg via ORAL
  Filled 2019-11-13 (×2): qty 1

## 2019-11-13 MED ORDER — ZOLPIDEM TARTRATE 5 MG PO TABS
5.0000 mg | ORAL_TABLET | Freq: Every evening | ORAL | Status: DC | PRN
  Administered 2019-11-14: 5 mg via ORAL
  Filled 2019-11-13: qty 1

## 2019-11-13 MED ORDER — SODIUM CHLORIDE 0.9 % IV BOLUS
1000.0000 mL | Freq: Once | INTRAVENOUS | Status: AC
  Administered 2019-11-13: 15:00:00 1000 mL via INTRAVENOUS

## 2019-11-13 NOTE — Progress Notes (Addendum)
Progress Note   Subjective  Chief Complaint: Nausea, vomiting, diarrhea, abdominal pain  EGD 11/12/2019 with no obvious source for recent symptoms, mild erythematous to adenopathy and a small hiatal hernia, biopsies pending; recommendations include continuing pantoprazole 40 mg twice [name] and converting to p.o. when ready/tolerating oral intake  Today, the patient tells me that he is feeling fairly well, he continues with discomfort mostly in his back now and not his abdomen.  He is still somewhat nauseous but the medicines are helping and he has had no further vomiting.  His last bowel movement was a few minutes before I came in and was still mostly loose.  Tells me that he is ready to go home.  He is having someone bring him in some food that "I can eat".   Objective   Vital signs in last 24 hours: Temp:  [98.6 F (37 C)-99.6 F (37.6 C)] 98.7 F (37.1 C) (01/27 0639) Pulse Rate:  [52-58] 55 (01/27 0639) Resp:  [18] 18 (01/27 0639) BP: (107-118)/(74-84) 114/74 (01/27 0639) SpO2:  [99 %-100 %] 99 % (01/27 0639) Weight:  [69.6 kg-70.4 kg] 70.4 kg (01/27 0500) Last BM Date: 11/12/19 General:    AA male in NAD Heart:  Regular rate and rhythm; no murmurs Lungs: Respirations even and unlabored, lungs CTA bilaterally Abdomen:  Soft, nontender and nondistended. Normal bowel sounds. Extremities:  Without edema. Neurologic:  Alert and oriented,  grossly normal neurologically. Psych:  Cooperative. Normal mood and affect.  Intake/Output from previous day: 01/26 0701 - 01/27 0700 In: 3247.3 [P.O.:660; I.V.:2542.5; IV Piggyback:44.8] Out: 600 [Urine:600] Intake/Output this shift: Total I/O In: 300 [P.O.:300] Out: -   Lab Results: Recent Labs    11/11/19 0138 11/12/19 0152  WBC 12.0* 10.3  HGB 15.3 13.9  HCT 44.3 39.8  PLT 318 268   BMET Recent Labs    11/11/19 0138 11/12/19 0152  NA 139 139  K 3.8 3.8  CL 104 106  CO2 21* 23  GLUCOSE 98 97  BUN 16 16  CREATININE  0.99 1.22  CALCIUM 9.5 9.0   LFT Recent Labs    11/12/19 0152  PROT 6.6  ALBUMIN 3.8  AST 18  ALT 17  ALKPHOS 38  BILITOT 1.6*   Studies/Results: ECHOCARDIOGRAM COMPLETE  Result Date: 11/12/2019   ECHOCARDIOGRAM REPORT   Patient Name:   Jason [name] Date of Exam: 11/12/2019 Medical Rec #:  [id number]       Height:       68.0 in Accession #:    [id number]      Weight:       152.3 lb Date of Birth:  07/17/1981      BSA:          1.82 m Patient Age:    39 years        BP:           114/81 mmHg Patient Gender: M               HR:           54 bpm. Exam Location:  Inpatient Procedure: 2D Echo Indications:    Bradycardia  History:        Patient has prior history of Echocardiogram examinations, most                 recent 11/28/2018. AKI (acute kidney injury)                 Hx cocaine  abuse                 Pulmonary embolism                 Hx of alcohol abuse.  Sonographer:    Vikki Ports Turrentine Referring Phys: [id number] Leonardo Gallatin IMPRESSIONS  1. Left ventricular ejection fraction, by visual estimation, is 55 to 60%. The left ventricle has normal function. There is no left ventricular hypertrophy.  2. Left ventricular diastolic parameters are consistent with Grade I diastolic dysfunction (impaired relaxation).  3. The left ventricle has no regional wall motion abnormalities.  4. Global right ventricle has normal systolic function.The right ventricular size is normal. No increase in right ventricular wall thickness.  5. Left atrial size was normal.  6. Right atrial size was normal.  7. The mitral valve is normal in structure. No evidence of mitral valve regurgitation. No evidence of mitral stenosis.  8. The tricuspid valve is normal in structure.  9. The tricuspid valve is normal in structure. Tricuspid valve regurgitation is not demonstrated. 10. The aortic valve is normal in structure. Aortic valve regurgitation is not visualized. No evidence of aortic valve sclerosis or stenosis. 11. The pulmonic valve  was normal in structure. Pulmonic valve regurgitation is not visualized. 12. The inferior vena cava is normal in size with greater than 50% respiratory variability, suggesting right atrial pressure of 3 mmHg. 13. No significant change from prior study. 14. Prior images reviewed side by side. FINDINGS  Left Ventricle: Left ventricular ejection fraction, by visual estimation, is 55 to 60%. The left ventricle has normal function. The left ventricle has no regional wall motion abnormalities. There is no left ventricular hypertrophy. Left ventricular diastolic parameters are consistent with Grade I diastolic dysfunction (impaired relaxation). Normal left atrial pressure. Right Ventricle: The right ventricular size is normal. No increase in right ventricular wall thickness. Global RV systolic function is has normal systolic function. Left Atrium: Left atrial size was normal in size. Right Atrium: Right atrial size was normal in size Pericardium: There is no evidence of pericardial effusion. Mitral Valve: The mitral valve is normal in structure. No evidence of mitral valve regurgitation. No evidence of mitral valve stenosis by observation. Tricuspid Valve: The tricuspid valve is normal in structure. Tricuspid valve regurgitation is not demonstrated. Aortic Valve: The aortic valve is normal in structure. Aortic valve regurgitation is not visualized. The aortic valve is structurally normal, with no evidence of sclerosis or stenosis. Pulmonic Valve: The pulmonic valve was normal in structure. Pulmonic valve regurgitation is not visualized. Pulmonic regurgitation is not visualized. Aorta: The aortic root, ascending aorta and aortic arch are all structurally normal, with no evidence of dilitation or obstruction. Venous: The inferior vena cava is normal in size with greater than 50% respiratory variability, suggesting right atrial pressure of 3 mmHg. IAS/Shunts: No atrial level shunt detected by color flow Doppler. There is no  evidence of a patent foramen ovale. No ventricular septal defect is seen or detected. There is no evidence of an atrial septal defect.  LEFT VENTRICLE PLAX 2D LVIDd:         4.80 cm  Diastology LVIDs:         3.40 cm  LV e' lateral:   12.00 cm/s LV PW:         0.70 cm  LV E/e' lateral: 4.3 LV IVS:        0.70 cm  LV e' medial:    9.90 cm/s LVOT diam:  2.00 cm  LV E/e' medial:  5.2 LV SV:         60 ml LV SV Index:   32.93 LVOT Area:     3.14 cm  RIGHT VENTRICLE RV S prime:     11.10 cm/s TAPSE (M-mode): 2.7 cm LEFT ATRIUM           Index       RIGHT ATRIUM           Index LA diam:      3.00 cm 1.65 cm/m  RA Area:     13.80 cm LA Vol (A2C): 52.8 ml 29.00 ml/m RA Volume:   31.30 ml  17.19 ml/m LA Vol (A4C): 33.1 ml 18.18 ml/m  AORTIC VALVE LVOT Vmax:   108.00 cm/s LVOT Vmean:  73.700 cm/s LVOT VTI:    0.220 m MITRAL VALVE MV Area (PHT): 1.71 cm             SHUNTS MV PHT:        128.76 msec          Systemic VTI:  0.22 m MV Decel Time: 444 msec             Systemic Diam: 2.00 cm MV E velocity: 51.40 cm/s 103 cm/s MV A velocity: 62.60 cm/s 70.3 cm/s MV E/A ratio:  0.82       1.5  Mihai Croitoru MD Electronically signed by Sanda Klein MD Signature Date/Time: 11/12/2019/4:44:32 PM    Final     Assessment / Plan:   Assessment: 1.  Nausea/vomiting/diarrhea/mid abdominal pain: CT raise question of cystitis, urinalysis only showed a few bacteria, question of foodborne illness causing symptoms, white count is improving, EGD with a mild amount of dual adenopathy and otherwise normal, biopsies pending 2.  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 Tarri Glenn 3.  Aspiration pneumonia?:  Currently on empiric Maxipime and Flagyl  Plan: 1.  Awaiting biopsy results from EGD with mild dudoenal adenopathy 2.  We will change patient to Pantoprazole 40 mg p.o. twice [name] 3.  Patient can advance diet as tolerated 4.  Likely patient can be discharged from a GI standpoint with follow-up in our  clinic 5.  Please await final recommendations from Dr. Tarri Glenn later today  Thank you for kind consultation.   LOS: 4 days   Jason [name]  11/13/2019, 10:08 AM

## 2019-11-13 NOTE — Telephone Encounter (Signed)
-----   Message from [place], MD sent at 11/13/2019 11:09 AM EST ----- Please arrange outpatient hospital follow-up with [facility] in 4-6 weeks.  If you will let me know when the appointment will be I will put it in his hospital records today. Thank you.  KLB

## 2019-11-13 NOTE — Evaluation (Signed)
Physical Therapy Evaluation Patient Details Name: Jason [name] MRN: [id number] DOB: 1981/10/17 Today's Date: 11/13/2019   History of Present Illness  Pt is 39 year old with past medical history significant for sickle cell trait, PE 2020, history of renal infarct, protein S deficiency, L4-5 hemilaminectomy and microdiscectomy, migraines, history of DVT on Xarelto admitted with persistent nausea and vomiting.  CT abdomen and pelvis with contrast unremarkable, chest x-ray concerning for pneumonia versus aspiration.  Pt evaluated by GI to no obvious source for symptoms.  Pt c/o dizziness with head movement and PT consulted for vestibular eval. Pt does still have MRI pending.   Clinical Impression  Pt was admitted for nausea and vomitting.  PT consulted for vestibular/BPPV eval due to c/o dizziness with head movement.  Orthostatic BP were negative.  No nystagmus noted with any test (see below) and [employer] negative for BPPV.  Pt reported mild dizziness with turning and walking but reports not severe.  States worse episode was 2 months ago.  Pt demonstrated safe gait and transfers without LOB.  Gave pt habituation HEP and educated on performing if dizziness reoccurred - also recommended following up with doctor if dizziness reoccurred. No further acute PT indicated at this time.       Follow Up Recommendations No PT follow up    Equipment Recommendations  None recommended by PT    Recommendations for Other Services       Precautions / Restrictions Precautions Precautions: None      Mobility  Bed Mobility Overal bed mobility: Independent                Transfers Overall transfer level: Independent                  Ambulation/Gait Ambulation/Gait assistance: Independent Gait Distance (Feet): 40 Feet Assistive device: None Gait Pattern/deviations: WFL(Within Functional Limits) Gait velocity: normal   General Gait Details: pt has been ambulating independently; he  demonstrated ambulation in room safely without LOB  Stairs            Wheelchair Mobility    Modified Rankin (Stroke Patients Only)       Balance Overall balance assessment: Independent                                           Pertinent Vitals/Pain Pain Assessment: No/denies pain   Spoke with RN and reports orthostatic vitals were stable earlier.  During PT orthostatic BP were stable with sitting, standing, and standing 3 mins with Bps of 100s/70s all positions.    Home Living Family/patient expects to be discharged to:: Private residence               Home Equipment: None      Prior Function Level of Independence: Independent               Hand Dominance        Extremity/Trunk Assessment   Upper Extremity Assessment Upper Extremity Assessment: Overall WFL for tasks assessed    Lower Extremity Assessment Lower Extremity Assessment: Overall WFL for tasks assessed    Cervical / Trunk Assessment Cervical / Trunk Assessment: Normal  Communication   Communication: No difficulties  Cognition Arousal/Alertness: Awake/alert Behavior During Therapy: WFL for tasks assessed/performed Overall Cognitive Status: Within Functional Limits for tasks assessed  General Comments General comments (skin integrity, edema, etc.): Vestibular:   EOEM: Intact, no nystagmus, no symptoms Gaze Stabilization:  Intact, no nystagmus, no symptoms Head Thrust: No Nystagmus and no symptoms [facility]: negative bilaterally , no symptoms, no nystagmus  Pt reports that the worst episode of vertigo occurred 2 months ago.  Reports occasional dizziness since that time.  Described dizziness as "room spinning."  Reports that dizziness occurs at different times but typically with head movements or turning when walking.  Reports has had mild dizziness this hospital admission, but not as severe as the episode  2 months ago.  During PT eval, pt with only c/o mild dizziness when returned to sitting from supine and when turning with walking.      Exercises     Assessment/Plan    PT Assessment Patent does not need any further PT services  PT Problem List         PT Treatment Interventions      PT Goals (Current goals can be found in the Care Plan section)  Acute Rehab PT Goals Patient Stated Goal: return home PT Goal Formulation: With patient Time For Goal Achievement: 11/13/19 Potential to Achieve Goals: Good    Frequency     Barriers to discharge        Co-evaluation               AM-PAC PT "6 Clicks" Mobility  Outcome Measure Help needed turning from your back to your side while in a flat bed without using bedrails?: None Help needed moving from lying on your back to sitting on the side of a flat bed without using bedrails?: None Help needed moving to and from a bed to a chair (including a wheelchair)?: None Help needed standing up from a chair using your arms (e.g., wheelchair or bedside chair)?: None Help needed to walk in hospital room?: None Help needed climbing 3-5 steps with a railing? : None 6 Click Score: 24    End of Session   Activity Tolerance: Patient tolerated treatment well Patient left: in chair;with call bell/phone within reach(in chair no alarm at arrival) Nurse Communication: Mobility status PT Visit Diagnosis: Dizziness and giddiness (R42)    Time: [id number] PT Time Calculation (min) (ACUTE ONLY): 30 min   Charges:   PT Evaluation $PT Eval Moderate Complexity: 1 Mod          Maggie Font, PT Acute Rehab Services Pager [phone] [facility] [phone] [facility] 11/13/2019, 5:28 PM

## 2019-11-13 NOTE — Discharge Instructions (Signed)
Information on my medicine - XARELTO (rivaroxaban)  WHY WAS XARELTO PRESCRIBED FOR YOU? Xarelto was prescribed to treat blood clots that may have been found in the veins of your legs (deep vein thrombosis) or in your lungs (pulmonary embolism) and to reduce the risk of them occurring again.  What do you need to know about Xarelto? Your current dose is one 20 mg tablet taken ONCE A DAY with your evening meal.  DO NOT stop taking Xarelto without talking to the health care provider who prescribed the medication.  Refill your prescription for 20 mg tablets before you run out.  After discharge, you should have regular check-up appointments with your healthcare provider that is prescribing your Xarelto.  In the future your dose may need to be changed if your kidney function changes by a significant amount.  What do you do if you miss a dose? If you are taking Xarelto ONCE DAILY and you miss a dose, take it as soon as you remember on the same day then continue your regularly scheduled once daily regimen the next day. Do not take two doses of Xarelto at the same time.   Important Safety Information Xarelto is a blood thinner medicine that can cause bleeding. You should call your healthcare provider right away if you experience any of the following: ? Bleeding from an injury or your nose that does not stop. ? Unusual colored urine (red or dark brown) or unusual colored stools (red or black). ? Unusual bruising for unknown reasons. ? A serious fall or if you hit your head (even if there is no bleeding).  Some medicines may interact with Xarelto and might increase your risk of bleeding while on Xarelto. To help avoid this, consult your healthcare provider or pharmacist prior to using any new prescription or non-prescription medications, including herbals, vitamins, non-steroidal anti-inflammatory drugs (NSAIDs) and supplements.  This website has more information on Xarelto:  [email or web address]

## 2019-11-13 NOTE — Progress Notes (Signed)
PROGRESS NOTE    Jason [name]  [id number] DOB: 08/04/1981 DOA: 11/09/2019 PCP: Shelby Mattocks, PA-C   Brief Narrative: 39 year old with past medical history significant for sickle cell trait, PE 2020, history of renal infarct, protein S deficiency, history of DVT on Xarelto admitted with persistent nausea and vomiting.  CT abdomen and pelvis with contrast unremarkable, chest x-ray concerning for pneumonia versus aspiration.  Patient continued to have ongoing nausea and abdominal pain, evaluated by GI.  Underwent endoscopy on 126; that show normal esophagus, a small hiatal hernia, normal stomach, mild erythematous to adenopathy.  No obvious source for recent symptoms.  Patient has been complaining of dizziness, his head move around worse with position and when he is stand up.  He also report headache.   Assessment & Plan:   Active Problems:   Lactic acidosis   Sepsis ([place])   Community acquired pneumonia   Nausea & vomiting   Diarrhea   History of pulmonary embolism   AKI (acute kidney injury) ([place])   Duodenitis  1-intractable nausea vomiting abdominal pain: Unclear etiology, work-up unrevealing normal endoscopy CT negative.  Might have been related to cystitis. Continue with supportive care. This episode of vomiting last night.  He was able to tolerate breakfast today. Continue with PPI.   Bradycardia; appears asymptomatic.  His heart rate increase on standing up. Will ask nurse to walk patient and check heart rate TSH normal  ECHO;   Dizziness: Concern with orthostatic hypotension versus BPPV. Will ask PT to evaluate. Check orthostatic vitals we will give IV fluids and IV bolus. He reports headache.  Will proceed with MRI   Sepsis concerning for pneumonia Lactic acidosis: Resolved He was treated with IV cefepime, Flagyl and vancomycin. He was transitioned to Augmentin.  Diarrhea: Resolved  History of PE/factor S deficiency, history of DVT: On  Xarelto Back on Xarelto AKI; resolved with IV fluids.  Chronic back pain: Supposedly to be on Suboxone, patient has not been taking and has been using his mother oxycodone. He needs to follow-up with his pain clinic doctors  Estimated body mass index is 23.6 kg/m as calculated from the following:   Height as of this encounter: 5\' 8"  (1.727 m).   Weight as of this encounter: 70.4 kg.   DVT prophylaxis: Xarelto Code Status: full code Family Communication: mother at bedside.  Disposition Plan: from home, like home at time of discharge. Needs to do work up for dizziness, checking orthostatic, IV fluids and MRI.   Consultants:   GI  Procedures:  Endoscopy   Antimicrobials:  Augmentin   Subjective: He report dizziness, specially with movement, turning head and standing up.  He report headaches. Lower extremities Weakness.   Objective: Vitals:   11/12/19 1730 11/12/19 2041 11/13/19 0500 11/13/19 0639  BP: 118/84 107/76  114/74  Pulse: (!) 52 (!) 58  (!) 55  Resp: 18 18  18   Temp: 99.4 F (37.4 C) 99.6 F (37.6 C)  98.7 F (37.1 C)  TempSrc: Oral Oral  Oral  SpO2: 100% 100%  99%  Weight: 69.6 kg  70.4 kg   Height:        Intake/Output Summary (Last 24 hours) at 11/13/2019 0814 Last data filed at 11/13/2019 0600 Gross per 24 hour  Intake 3247.26 ml  Output 600 ml  Net 2647.26 ml   Filed Weights   11/12/19 0800 11/12/19 1730 11/13/19 0500  Weight: 69.1 kg 69.6 kg 70.4 kg    Examination:  General exam: Appears calm and  comfortable  Respiratory system: Clear to auscultation. Respiratory effort normal. Cardiovascular system: S1 & S2 heard, RRR. No JVD, murmurs, rubs, gallops or clicks. No pedal edema. Gastrointestinal system: Abdomen is nondistended, soft and nontender. No organomegaly or masses felt. Normal bowel sounds heard. Central nervous system: Alert and oriented. No focal neurological deficits. Extremities: Symmetric 5 x 5 power. Skin: No rashes, lesions  or ulcers    Data Reviewed: I have personally reviewed following labs and imaging studies  CBC: Recent Labs  Lab 11/09/19 1946 11/10/19 0242 11/11/19 0138 11/12/19 0152  WBC 23.4* 21.5* 12.0* 10.3  NEUTROABS 19.7*  --  9.2*  --   HGB 16.7 15.5 15.3 13.9  HCT 49.7 46.0 44.3 39.8  MCV 80.9 80.7 77.4* 77.9*  PLT 406* 351 318 [id number]   Basic Metabolic Panel: Recent Labs  Lab 11/09/19 1946 11/10/19 0242 11/11/19 0138 11/12/19 0152  NA 137 143 139 139  K 3.3* 4.4 3.8 3.8  CL 101 106 104 106  CO2 17* 20* 21* 23  GLUCOSE 172* 121* 98 97  BUN 9 11 16 16   CREATININE 1.31* 1.15 0.99 1.22  CALCIUM 10.5* 9.8 9.5 9.0   GFR: Estimated Creatinine Clearance: 79.4 mL/min (by C-G formula based on SCr of 1.22 mg/dL). Liver Function Tests: Recent Labs  Lab 11/09/19 1946 11/10/19 0242 11/11/19 0138 11/12/19 0152  AST 24 26 26 18   ALT 20 18 20 17   ALKPHOS 61 52 46 38  BILITOT 0.4 0.4 0.9 1.6*  PROT 8.3* 7.6 7.7 6.6  ALBUMIN 4.8 4.3 4.4 3.8   Recent Labs  Lab 11/09/19 1946  LIPASE 51   No results for input(s): AMMONIA in the last 168 hours. Coagulation Profile: Recent Labs  Lab 11/09/19 2303  INR 1.1   Cardiac Enzymes: No results for input(s): CKTOTAL, CKMB, CKMBINDEX, TROPONINI in the last 168 hours. BNP (last 3 results) No results for input(s): PROBNP in the last 8760 hours. HbA1C: No results for input(s): HGBA1C in the last 72 hours. CBG: Recent Labs  Lab 11/09/19 1745  GLUCAP 117*   Lipid Profile: No results for input(s): CHOL, HDL, LDLCALC, TRIG, CHOLHDL, LDLDIRECT in the last 72 hours. Thyroid Function Tests: Recent Labs    11/12/19 1811  TSH 2.664   Anemia Panel: No results for input(s): VITAMINB12, FOLATE, FERRITIN, TIBC, IRON, RETICCTPCT in the last 72 hours. Sepsis Labs: Recent Labs  Lab 11/09/19 1902 11/09/19 1946  LATICACIDVEN 8.0* 5.9*    Recent Results (from the past 240 hour(s))  Blood culture (routine x 2)     Status: None  (Preliminary result)   Collection Time: 11/09/19  8:21 PM   Specimen: BLOOD  Result Value Ref Range Status   Specimen Description BLOOD RIGHT ANTECUBITAL  Final   Special Requests   Final    BOTTLES DRAWN AEROBIC AND ANAEROBIC Blood Culture adequate volume   Culture   Final    NO GROWTH 3 DAYS Performed at [facility], [place] [address] [place], [place] [address]    Report Status PENDING  Incomplete  Urine culture     Status: Abnormal   Collection Time: 11/09/19  8:58 PM   Specimen: In/Out Cath Urine  Result Value Ref Range Status   Specimen Description IN/OUT CATH URINE  Final   Special Requests   Final    NONE Performed at [facility], [place] [address], [place], [place] [address]    Culture MULTIPLE SPECIES PRESENT, SUGGEST RECOLLECTION (A)  Final  Report Status 11/11/2019 FINAL  Final  SARS CORONAVIRUS 2 (TAT 6-24 HRS)     Status: None   Collection Time: 11/10/19 12:31 AM  Result Value Ref Range Status   SARS Coronavirus 2 NEGATIVE NEGATIVE Final    Comment: (NOTE) SARS-CoV-2 target nucleic acids are NOT DETECTED. The SARS-CoV-2 RNA is generally detectable in upper and lower respiratory specimens during the acute phase of infection. Negative results do not preclude SARS-CoV-2 infection, do not rule out co-infections with other pathogens, and should not be used as the sole basis for treatment or other patient management decisions. Negative results must be combined with clinical observations, patient history, and epidemiological information. The expected result is Negative. Fact Sheet for Patients: SugarRoll.be Fact Sheet for Healthcare Providers: https://www.woods-mathews.com/ This test is not yet approved or cleared by the [place] FDA and  has been authorized for detection and/or diagnosis of SARS-CoV-2 by FDA under an Emergency Use Authorization (EUA). This EUA will remain  in effect (meaning this test can be  used) for the duration of the COVID-19 declaration under Section 56 4(b)(1) of the Act, 21 U.S.C. section 360bbb-3(b)(1), unless the authorization is terminated or revoked sooner. Performed at [facility], [place] [address] [place], [place] [address]          Radiology Studies: ECHOCARDIOGRAM COMPLETE  Result Date: 11/12/2019   ECHOCARDIOGRAM REPORT   Patient Name:   Jason [name] Date of Exam: 11/12/2019 Medical Rec #:  [id number]       Height:       68.0 in Accession #:    [id number]      Weight:       152.3 lb Date of Birth:  01/19/1981      BSA:          1.82 m Patient Age:    11 years        BP:           114/81 mmHg Patient Gender: M               HR:           54 bpm. Exam Location:  Inpatient Procedure: 2D Echo Indications:    Bradycardia  History:        Patient has prior history of Echocardiogram examinations, most                 recent 11/28/2018. AKI (acute kidney injury)                 Hx cocaine abuse                 Pulmonary embolism                 Hx of alcohol abuse.  Sonographer:    Vikki Ports Turrentine Referring Phys: [id number] [place] IMPRESSIONS  1. Left ventricular ejection fraction, by visual estimation, is 55 to 60%. The left ventricle has normal function. There is no left ventricular hypertrophy.  2. Left ventricular diastolic parameters are consistent with Grade I diastolic dysfunction (impaired relaxation).  3. The left ventricle has no regional wall motion abnormalities.  4. Global right ventricle has normal systolic function.The right ventricular size is normal. No increase in right ventricular wall thickness.  5. Left atrial size was normal.  6. Right atrial size was normal.  7. The mitral valve is normal in structure. No evidence of mitral valve regurgitation. No evidence of mitral stenosis.  8. The tricuspid valve is  normal in structure.  9. The tricuspid valve is normal in structure. Tricuspid valve regurgitation is not demonstrated. 10. The aortic valve is  normal in structure. Aortic valve regurgitation is not visualized. No evidence of aortic valve sclerosis or stenosis. 11. The pulmonic valve was normal in structure. Pulmonic valve regurgitation is not visualized. 12. The inferior vena cava is normal in size with greater than 50% respiratory variability, suggesting right atrial pressure of 3 mmHg. 13. No significant change from prior study. 14. Prior images reviewed side by side. FINDINGS  Left Ventricle: Left ventricular ejection fraction, by visual estimation, is 55 to 60%. The left ventricle has normal function. The left ventricle has no regional wall motion abnormalities. There is no left ventricular hypertrophy. Left ventricular diastolic parameters are consistent with Grade I diastolic dysfunction (impaired relaxation). Normal left atrial pressure. Right Ventricle: The right ventricular size is normal. No increase in right ventricular wall thickness. Global RV systolic function is has normal systolic function. Left Atrium: Left atrial size was normal in size. Right Atrium: Right atrial size was normal in size Pericardium: There is no evidence of pericardial effusion. Mitral Valve: The mitral valve is normal in structure. No evidence of mitral valve regurgitation. No evidence of mitral valve stenosis by observation. Tricuspid Valve: The tricuspid valve is normal in structure. Tricuspid valve regurgitation is not demonstrated. Aortic Valve: The aortic valve is normal in structure. Aortic valve regurgitation is not visualized. The aortic valve is structurally normal, with no evidence of sclerosis or stenosis. Pulmonic Valve: The pulmonic valve was normal in structure. Pulmonic valve regurgitation is not visualized. Pulmonic regurgitation is not visualized. Aorta: The aortic root, ascending aorta and aortic arch are all structurally normal, with no evidence of dilitation or obstruction. Venous: The inferior vena cava is normal in size with greater than 50%  respiratory variability, suggesting right atrial pressure of 3 mmHg. IAS/Shunts: No atrial level shunt detected by color flow Doppler. There is no evidence of a patent foramen ovale. No ventricular septal defect is seen or detected. There is no evidence of an atrial septal defect.  LEFT VENTRICLE PLAX 2D LVIDd:         4.80 cm  Diastology LVIDs:         3.40 cm  LV e' lateral:   12.00 cm/s LV PW:         0.70 cm  LV E/e' lateral: 4.3 LV IVS:        0.70 cm  LV e' medial:    9.90 cm/s LVOT diam:     2.00 cm  LV E/e' medial:  5.2 LV SV:         60 ml LV SV Index:   32.93 LVOT Area:     3.14 cm  RIGHT VENTRICLE RV S prime:     11.10 cm/s TAPSE (M-mode): 2.7 cm LEFT ATRIUM           Index       RIGHT ATRIUM           Index LA diam:      3.00 cm 1.65 cm/m  RA Area:     13.80 cm LA Vol (A2C): 52.8 ml 29.00 ml/m RA Volume:   31.30 ml  17.19 ml/m LA Vol (A4C): 33.1 ml 18.18 ml/m  AORTIC VALVE LVOT Vmax:   108.00 cm/s LVOT Vmean:  73.700 cm/s LVOT VTI:    0.220 m MITRAL VALVE MV Area (PHT): 1.71 cm  SHUNTS MV PHT:        128.76 msec          Systemic VTI:  0.22 m MV Decel Time: 444 msec             Systemic Diam: 2.00 cm MV E velocity: 51.40 cm/s 103 cm/s MV A velocity: 62.60 cm/s 70.3 cm/s MV E/A ratio:  0.82       1.5  Mihai Croitoru MD Electronically signed by Sanda Klein MD Signature Date/Time: 11/12/2019/4:44:32 PM    Final         Scheduled Meds: . amoxicillin-clavulanate  1 tablet Oral Q12H  . gabapentin  300 mg Oral TID  . pantoprazole (PROTONIX) IV  40 mg Intravenous Q12H  . rivaroxaban  20 mg Oral Q supper   Continuous Infusions: . sodium chloride 125 mL/hr at 11/13/19 0600     LOS: 4 days    Time spent: 35 minutes    Elmarie Shiley, MD [facility]   If 7PM-7AM, please contact night-coverage  11/13/2019, 8:14 AM

## 2019-11-13 NOTE — Plan of Care (Signed)
  Problem: Education: Goal: Knowledge of General Education information will improve Description: Including pain rating scale, medication(s)/side effects and non-pharmacologic comfort measures Outcome: Progressing   Problem: Health Behavior/Discharge Planning: Goal: Ability to manage health-related needs will improve Outcome: Progressing   Problem: Clinical Measurements: Goal: Ability to maintain clinical measurements within normal limits will improve Outcome: Progressing Goal: Will remain free from infection Outcome: Progressing Goal: Respiratory complications will improve Outcome: Progressing Goal: Cardiovascular complication will be avoided Outcome: Progressing   Problem: Activity: Goal: Risk for activity intolerance will decrease Outcome: Progressing   Problem: Nutrition: Goal: Adequate nutrition will be maintained Outcome: Progressing   Problem: Elimination: Goal: Will not experience complications related to bowel motility Outcome: Progressing Goal: Will not experience complications related to urinary retention Outcome: Progressing   Problem: Pain Managment: Goal: General experience of comfort will improve Outcome: Progressing   Problem: Safety: Goal: Ability to remain free from injury will improve Outcome: Progressing   Problem: Skin Integrity: Goal: Risk for impaired skin integrity will decrease Outcome: Progressing   

## 2019-11-13 NOTE — Telephone Encounter (Signed)
Patient scheduled with Dr. Tarri Glenn on 12/30/19 at 10:30 am. Letter printed and mailed.

## 2019-11-14 ENCOUNTER — Encounter (HOSPITAL_COMMUNITY): Payer: Self-pay | Admitting: Internal Medicine

## 2019-11-14 ENCOUNTER — Encounter: Payer: Self-pay | Admitting: Gastroenterology

## 2019-11-14 LAB — CULTURE, BLOOD (ROUTINE X 2)
Culture: NO GROWTH
Special Requests: ADEQUATE

## 2019-11-14 LAB — MAGNESIUM: Magnesium: 1.9 mg/dL (ref 1.7–2.4)

## 2019-11-14 LAB — SURGICAL PATHOLOGY

## 2019-11-14 MED ORDER — AMOXICILLIN-POT CLAVULANATE 875-125 MG PO TABS: 1.0000 | ORAL_TABLET | Freq: Two times a day (BID) | ORAL | 0 refills | Status: DC

## 2019-11-14 MED ORDER — GABAPENTIN 300 MG PO CAPS: 300.0000 mg | ORAL_CAPSULE | Freq: Three times a day (TID) | ORAL | 0 refills | Status: DC

## 2019-11-14 MED ORDER — INFLUENZA VAC SPLIT QUAD 0.5 ML IM SUSY: 0.5000 mL | PREFILLED_SYRINGE | INTRAMUSCULAR | Status: DC

## 2019-11-14 MED ORDER — PANTOPRAZOLE SODIUM 40 MG PO TBEC: 40.0000 mg | DELAYED_RELEASE_TABLET | Freq: Two times a day (BID) | ORAL | 0 refills | Status: DC

## 2019-11-14 NOTE — Discharge Summary (Signed)
Physician Discharge Summary  Jason [name] [id number] DOB: 30-Jun-1981 DOA: 11/09/2019  PCP: Shelby Mattocks, PA-C  Admit date: 11/09/2019 Discharge date: 11/14/2019  Admitted From: Home  Disposition: Home   Recommendations for Outpatient Follow-up:  1. Follow up with PCP in 1-2 weeks 2. Please obtain BMP/CBC in one week 3. Needs to follow up with cardiology for evaluation of dizziness and bradycardia.   Home Health: none  Discharge Condition: stable.  CODE STATUS: full code Diet recommendation: Heart Healthy  Brief/Interim Summary: 39 year old with past medical history significant for sickle cell trait, PE 2020, history of renal infarct, protein S deficiency, history of DVT on Xarelto admitted with persistent nausea and vomiting.  CT abdomen and pelvis with contrast unremarkable, chest x-ray concerning for pneumonia versus aspiration.  Patient continued to have ongoing nausea and abdominal pain, evaluated by GI.  Underwent endoscopy on 126; that show normal esophagus, a small hiatal hernia, normal stomach, mild erythematous to adenopathy.  No obvious source for recent symptoms.  Patient has been complaining of dizziness, his head move around worse with position and when he is stand up.  He also report headache.   1-Intractable nausea vomiting abdominal pain: Unclear etiology, work-up unrevealing normal endoscopy CT negative.  Might have been related to cystitis. Continue with supportive care. He has been able to tolerate diet.  Continue with PPI.  plan to discharge home today.   Bradycardia; appears asymptomatic.  His heart rate increase on standing up. TSH normal  ECHO; normal ef fraction.  HR increase appropriately on exertion.   Dizziness: Concern with orthostatic hypotension versus BPPV. Will ask PT to evaluate. Check orthostatic vitals we will give IV fluids and IV bolus. He reports headache. MRI negative.  Will refer him to cardiology for further  evaluation. Consider holter.    Sepsis concerning for pneumonia Lactic acidosis: Resolved He was treated with IV cefepime, Flagyl and vancomycin. He was transitioned to Augmentin. He will complete 7 days course.   Diarrhea: Resolved  History of PE/factor S deficiency, history of DVT: On Xarelto Continue with Xarelto.  AKI; resolved with IV fluids.  Chronic back pain: Supposedly to be on Suboxone, patient has not been taking and has been using his mother oxycodone. He needs to follow-up with his pain clinic doctors needs to follow up with pain clinic.    Discharge Diagnoses:  Active Problems:   Lactic acidosis   Sepsis ([place])   Community acquired pneumonia   Nausea vomiting and diarrhea   Diarrhea   History of pulmonary embolism   AKI (acute kidney injury) ([place])   Duodenitis    Discharge Instructions  Discharge Instructions    Diet - low sodium heart healthy   Complete by: As directed    Increase activity slowly   Complete by: As directed      Allergies as of 11/14/2019      Reactions   Morphine And Related Swelling   Other Hives, Swelling   Antibiotic that was given when he had pneumonia       Medication List    STOP taking these medications   meclizine 25 MG tablet Commonly known as: ANTIVERT     TAKE these medications   amoxicillin-clavulanate 875-125 MG tablet Commonly known as: AUGMENTIN Take 1 tablet by mouth every 12 (twelve) hours.   diazepam 5 MG tablet Commonly known as: VALIUM Take 1 tablet (5 mg total) by mouth 2 (two) times daily.   gabapentin 300 MG capsule Commonly known as: NEURONTIN Take 1 capsule (  300 mg total) by mouth 3 (three) times daily. What changed:   how much to take  when to take this   ondansetron 4 MG disintegrating tablet Commonly known as: Zofran ODT Take 1 tablet (4 mg total) by mouth every 8 (eight) hours as needed. What changed: reasons to take this   pantoprazole 40 MG tablet Commonly known as:  PROTONIX Take 1 tablet (40 mg total) by mouth 2 (two) times daily before a meal.   rivaroxaban 20 MG Tabs tablet Commonly known as: Xarelto Take 1 tablet (20 mg total) by mouth daily with supper.      Follow-up Information    Rodriguez-Southworth, Sunday Spillers, PA-C Follow up in 1 week(s).   Specialties: Internal Medicine, Emergency Medicine Contact information: [address] [place] [address] [phone]          Allergies  Allergen Reactions  . Morphine And Related Swelling  . Other Hives and Swelling    Antibiotic that was given when he had pneumonia     Consultations: GI  Procedures/Studies: CT Head Wo Contrast  Result Date: 11/09/2019 CLINICAL DATA:  Encephalopathy. EXAM: CT HEAD WITHOUT CONTRAST TECHNIQUE: Contiguous axial images were obtained from the base of the skull through the vertex without intravenous contrast. COMPARISON:  Head CT 08/24/2019 FINDINGS: Brain: Mild motion artifact through the skull base. No intracranial hemorrhage, mass effect, or midline shift. No hydrocephalus. The basilar cisterns are patent. No evidence of territorial infarct or acute ischemia. No extra-axial or intracranial fluid collection. Vascular: No hyperdense vessel or unexpected calcification. Skull: No fracture or focal lesion. Mild motion artifact through the skull base. Sinuses/Orbits: No acute finding allowing for motion. Other: None. IMPRESSION: Negative head CT allowing for mild skull base motion artifact. Electronically Signed   By: Keith Rake M.D.   On: 11/09/2019 23:17   MR BRAIN WO CONTRAST  Result Date: 11/13/2019 CLINICAL DATA:  Initial evaluation for dizziness, headache. EXAM: MRI HEAD WITHOUT CONTRAST TECHNIQUE: Multiplanar, multiecho pulse sequences of the brain and surrounding structures were obtained without intravenous contrast. COMPARISON:  Prior head CT from 08/24/2019. FINDINGS: Brain: Cerebral volume within normal limits for patient age. No focal  parenchymal signal abnormality identified. No abnormal foci of restricted diffusion to suggest acute or subacute ischemia. Gray-white matter differentiation well maintained. No encephalomalacia to suggest chronic infarction. No foci of susceptibility artifact to suggest acute or chronic intracranial hemorrhage. No mass lesion, midline shift or mass effect. No hydrocephalus. No extra-axial fluid collection. Major dural sinuses are grossly patent. Pituitary gland and suprasellar region are normal. Midline structures intact and normal. Vascular: Major intracranial vascular flow voids well maintained and normal in appearance. Skull and upper cervical spine: Craniocervical junction normal. Visualized upper cervical spine within normal limits. Bone marrow signal intensity normal. No scalp soft tissue abnormality. Sinuses/Orbits: Globes and orbital soft tissues within normal limits. Mild scattered mucosal thickening noted within the paranasal sinuses. Trace opacity noted within the mastoid air cells bilaterally. Inner ear structures grossly normal. Other: None. IMPRESSION: Normal brain MRI for age. No acute intracranial abnormality identified. Electronically Signed   By: Jeannine Boga M.D.   On: 11/13/2019 23:59   CT ABDOMEN PELVIS W CONTRAST  Result Date: 11/09/2019 CLINICAL DATA:  Nausea and vomiting abdominal pain.  Sepsis. EXAM: CT ABDOMEN AND PELVIS WITH CONTRAST TECHNIQUE: Multidetector CT imaging of the abdomen and pelvis was performed using the standard protocol following bolus administration of intravenous contrast. CONTRAST:  153mL OMNIPAQUE IOHEXOL 300 MG/ML  SOLN COMPARISON:  12/03/2018 FINDINGS: Lower chest: The lung bases are clear. The heart size is normal. Hepatobiliary: The liver is normal. Normal gallbladder.There is no biliary ductal dilation. Pancreas: Normal contours without ductal dilatation. No peripancreatic fluid collection. Spleen: No splenic laceration or hematoma. Adrenals/Urinary  Tract: --Adrenal glands: No adrenal hemorrhage. --Right kidney/ureter: No hydronephrosis or perinephric hematoma. --Left kidney/ureter: No hydronephrosis or perinephric hematoma. --Urinary bladder: There appears to be some mild bladder wall thickening. Stomach/Bowel: --Stomach/Duodenum: No hiatal hernia or other gastric abnormality. Normal duodenal course and caliber. --Small bowel: No dilatation or inflammation. --Colon: There is liquid stool at the level of the rectum. The colon is relatively decompressed. --Appendix: Not visualized. No right lower quadrant inflammation or free fluid. Vascular/Lymphatic: Normal course and caliber of the major abdominal vessels. There is suggestion of a circumaortic left renal vein, a normal variant. --No retroperitoneal lymphadenopathy. --No mesenteric lymphadenopathy. --there are few mildly enlarged inguinal lymph nodes. Reproductive: Unremarkable Other: No ascites or free air. The abdominal wall is normal. Musculoskeletal. No acute displaced fractures. IMPRESSION: 1. Possible mild bladder wall thickening. Correlation with urinalysis is recommended. 2. Otherwise, no acute intra-abdominal abnormality detected. Electronically Signed   By: Constance Holster M.D.   On: 11/09/2019 23:15   DG Chest Port 1 View  Result Date: 11/09/2019 CLINICAL DATA:  Fever EXAM: PORTABLE CHEST 1 VIEW COMPARISON:  Radiograph 12/03/2018, CT 12/26/2018 FINDINGS: Some streaky opacities in the lung bases likely reflect areas of atelectasis. There is a left perihilar focus of consolidation with air bronchograms. No pneumothorax. No effusion. The cardiomediastinal contours are unremarkable. No acute osseous or soft tissue abnormality. IMPRESSION: Left perihilar focus of consolidation with air bronchograms could reflect pneumonia or sequela of aspiration. Electronically Signed   By: Lovena Le M.D.   On: 11/09/2019 20:47   ECHOCARDIOGRAM COMPLETE  Result Date: 11/12/2019   ECHOCARDIOGRAM REPORT    Patient Name:   Jason [name] Date of Exam: 11/12/2019 Medical Rec #:  [id number]       Height:       68.0 in Accession #:    [id number]      Weight:       152.3 lb Date of Birth:  1980-10-27      BSA:          1.82 m Patient Age:    8 years        BP:           114/81 mmHg Patient Gender: M               HR:           54 bpm. Exam Location:  Inpatient Procedure: 2D Echo Indications:    Bradycardia  History:        Patient has prior history of Echocardiogram examinations, most                 recent 11/28/2018. AKI (acute kidney injury)                 Hx cocaine abuse                 Pulmonary embolism                 Hx of alcohol abuse.  Sonographer:    Vikki Ports Turrentine Referring Phys: [id number] [place] [place] IMPRESSIONS  1. Left ventricular ejection fraction, by visual estimation, is 55 to 60%. The left ventricle has normal function. There is no left ventricular hypertrophy.  2. Left ventricular diastolic  parameters are consistent with Grade I diastolic dysfunction (impaired relaxation).  3. The left ventricle has no regional wall motion abnormalities.  4. Global right ventricle has normal systolic function.The right ventricular size is normal. No increase in right ventricular wall thickness.  5. Left atrial size was normal.  6. Right atrial size was normal.  7. The mitral valve is normal in structure. No evidence of mitral valve regurgitation. No evidence of mitral stenosis.  8. The tricuspid valve is normal in structure.  9. The tricuspid valve is normal in structure. Tricuspid valve regurgitation is not demonstrated. 10. The aortic valve is normal in structure. Aortic valve regurgitation is not visualized. No evidence of aortic valve sclerosis or stenosis. 11. The pulmonic valve was normal in structure. Pulmonic valve regurgitation is not visualized. 12. The inferior vena cava is normal in size with greater than 50% respiratory variability, suggesting right atrial pressure of 3 mmHg. 13. No significant change  from prior study. 14. Prior images reviewed side by side. FINDINGS  Left Ventricle: Left ventricular ejection fraction, by visual estimation, is 55 to 60%. The left ventricle has normal function. The left ventricle has no regional wall motion abnormalities. There is no left ventricular hypertrophy. Left ventricular diastolic parameters are consistent with Grade I diastolic dysfunction (impaired relaxation). Normal left atrial pressure. Right Ventricle: The right ventricular size is normal. No increase in right ventricular wall thickness. Global RV systolic function is has normal systolic function. Left Atrium: Left atrial size was normal in size. Right Atrium: Right atrial size was normal in size Pericardium: There is no evidence of pericardial effusion. Mitral Valve: The mitral valve is normal in structure. No evidence of mitral valve regurgitation. No evidence of mitral valve stenosis by observation. Tricuspid Valve: The tricuspid valve is normal in structure. Tricuspid valve regurgitation is not demonstrated. Aortic Valve: The aortic valve is normal in structure. Aortic valve regurgitation is not visualized. The aortic valve is structurally normal, with no evidence of sclerosis or stenosis. Pulmonic Valve: The pulmonic valve was normal in structure. Pulmonic valve regurgitation is not visualized. Pulmonic regurgitation is not visualized. Aorta: The aortic root, ascending aorta and aortic arch are all structurally normal, with no evidence of dilitation or obstruction. Venous: The inferior vena cava is normal in size with greater than 50% respiratory variability, suggesting right atrial pressure of 3 mmHg. IAS/Shunts: No atrial level shunt detected by color flow Doppler. There is no evidence of a patent foramen ovale. No ventricular septal defect is seen or detected. There is no evidence of an atrial septal defect.  LEFT VENTRICLE PLAX 2D LVIDd:         4.80 cm  Diastology LVIDs:         3.40 cm  LV e' lateral:    12.00 cm/s LV PW:         0.70 cm  LV E/e' lateral: 4.3 LV IVS:        0.70 cm  LV e' medial:    9.90 cm/s LVOT diam:     2.00 cm  LV E/e' medial:  5.2 LV SV:         60 ml LV SV Index:   32.93 LVOT Area:     3.14 cm  RIGHT VENTRICLE RV S prime:     11.10 cm/s TAPSE (M-mode): 2.7 cm LEFT ATRIUM           Index       RIGHT ATRIUM  Index LA diam:      3.00 cm 1.65 cm/m  RA Area:     13.80 cm LA Vol (A2C): 52.8 ml 29.00 ml/m RA Volume:   31.30 ml  17.19 ml/m LA Vol (A4C): 33.1 ml 18.18 ml/m  AORTIC VALVE LVOT Vmax:   108.00 cm/s LVOT Vmean:  73.700 cm/s LVOT VTI:    0.220 m MITRAL VALVE MV Area (PHT): 1.71 cm             SHUNTS MV PHT:        128.76 msec          Systemic VTI:  0.22 m MV Decel Time: 444 msec             Systemic Diam: 2.00 cm MV E velocity: 51.40 cm/s 103 cm/s MV A velocity: 62.60 cm/s 70.3 cm/s MV E/A ratio:  0.82       1.5  Mihai Croitoru MD Electronically signed by Sanda Klein MD Signature Date/Time: 11/12/2019/4:44:32 PM    Final    (Echo, Carotid, EGD, Colonoscopy, ERCP)    Subjective:   Discharge Exam: Vitals:   11/13/19 2002 11/14/19 0559  BP: 116/75 118/83  Pulse: (!) 57 (!) 53  Resp: 20 16  Temp: 98.8 F (37.1 C) 98.2 F (36.8 C)  SpO2: 100% 100%     General: Pt is alert, awake, not in acute distress Cardiovascular: RRR, S1/S2 +, no rubs, no gallops Respiratory: CTA bilaterally, no wheezing, no rhonchi Abdominal: Soft, NT, ND, bowel sounds + Extremities: no edema, no cyanosis    The results of significant diagnostics from this hospitalization (including imaging, microbiology, ancillary and laboratory) are listed below for reference.     Microbiology: Recent Results (from the past 240 hour(s))  Blood culture (routine x 2)     Status: None   Collection Time: 11/09/19  8:21 PM   Specimen: BLOOD  Result Value Ref Range Status   Specimen Description BLOOD RIGHT ANTECUBITAL  Final   Special Requests   Final    BOTTLES DRAWN AEROBIC AND  ANAEROBIC Blood Culture adequate volume   Culture   Final    NO GROWTH 5 DAYS Performed at [facility], [address] [place], [place] [address]    Report Status 11/14/2019 FINAL  Final  Urine culture     Status: Abnormal   Collection Time: 11/09/19  8:58 PM   Specimen: In/Out Cath Urine  Result Value Ref Range Status   Specimen Description IN/OUT CATH URINE  Final   Special Requests   Final    NONE Performed at [facility], [place] [address] [place], [place] [address]    Culture MULTIPLE SPECIES PRESENT, SUGGEST RECOLLECTION (A)  Final   Report Status 11/11/2019 FINAL  Final  SARS CORONAVIRUS 2 (TAT 6-24 HRS)     Status: None   Collection Time: 11/10/19 12:31 AM  Result Value Ref Range Status   SARS Coronavirus 2 NEGATIVE NEGATIVE Final    Comment: (NOTE) SARS-CoV-2 target nucleic acids are NOT DETECTED. The SARS-CoV-2 RNA is generally detectable in upper and lower respiratory specimens during the acute phase of infection. Negative results do not preclude SARS-CoV-2 infection, do not rule out co-infections with other pathogens, and should not be used as the sole basis for treatment or other patient management decisions. Negative results must be combined with clinical observations, patient history, and epidemiological information. The expected result is Negative. Fact Sheet for Patients: SugarRoll.be Fact Sheet for Healthcare Providers: https://www.woods-mathews.com/  This test is not yet approved or cleared by the [place] and  has been authorized for detection and/or diagnosis of SARS-CoV-2 by FDA under an Emergency Use Authorization (EUA). This EUA will remain  in effect (meaning this test can be used) for the duration of the COVID-19 declaration under Section 56 4(b)(1) of the Act, 21 U.S.C. section 360bbb-3(b)(1), unless the authorization is terminated or revoked sooner. Performed at [facility],  [place] [address], [place], [place] [address]      Labs: BNP (last 3 results) No results for input(s): BNP in the last 8760 hours. Basic Metabolic Panel: Recent Labs  Lab 11/09/19 1946 11/10/19 0242 11/11/19 0138 11/12/19 0152 11/14/19 1020  NA 137 143 139 139  --   K 3.3* 4.4 3.8 3.8  --   CL 101 106 104 106  --   CO2 17* 20* 21* 23  --   GLUCOSE 172* 121* 98 97  --   BUN 9 11 16 16   --   CREATININE 1.31* 1.15 0.99 1.22  --   CALCIUM 10.5* 9.8 9.5 9.0  --   MG  --   --   --   --  1.9   Liver Function Tests: Recent Labs  Lab 11/09/19 1946 11/10/19 0242 11/11/19 0138 11/12/19 0152  AST 24 26 26 18   ALT 20 18 20 17   ALKPHOS 61 52 46 38  BILITOT 0.4 0.4 0.9 1.6*  PROT 8.3* 7.6 7.7 6.6  ALBUMIN 4.8 4.3 4.4 3.8   Recent Labs  Lab 11/09/19 1946  LIPASE 51   No results for input(s): AMMONIA in the last 168 hours. CBC: Recent Labs  Lab 11/09/19 1946 11/10/19 0242 11/11/19 0138 11/12/19 0152  WBC 23.4* 21.5* 12.0* 10.3  NEUTROABS 19.7*  --  9.2*  --   HGB 16.7 15.5 15.3 13.9  HCT 49.7 46.0 44.3 39.8  MCV 80.9 80.7 77.4* 77.9*  PLT 406* 351 318 268   Cardiac Enzymes: No results for input(s): CKTOTAL, CKMB, CKMBINDEX, TROPONINI in the last 168 hours. BNP: Invalid input(s): POCBNP CBG: Recent Labs  Lab 11/09/19 1745  GLUCAP 117*   D-Dimer No results for input(s): DDIMER in the last 72 hours. Hgb A1c No results for input(s): HGBA1C in the last 72 hours. Lipid Profile No results for input(s): CHOL, HDL, LDLCALC, TRIG, CHOLHDL, LDLDIRECT in the last 72 hours. Thyroid function studies Recent Labs    11/12/19 1811  TSH 2.664   Anemia work up No results for input(s): VITAMINB12, FOLATE, FERRITIN, TIBC, IRON, RETICCTPCT in the last 72 hours. Urinalysis    Component Value Date/Time   COLORURINE AMBER (A) 11/09/2019 1808   APPEARANCEUR CLOUDY (A) 11/09/2019 1808   APPEARANCEUR Hazy 01/13/2014 1418   LABSPEC 1.023 11/09/2019 1808   LABSPEC 1.028 01/13/2014  1418   PHURINE 5.0 11/09/2019 1808   GLUCOSEU NEGATIVE 11/09/2019 1808   GLUCOSEU NEGATIVE 03/23/2016 1215   HGBUR MODERATE (A) 11/09/2019 1808   BILIRUBINUR NEGATIVE 11/09/2019 1808   BILIRUBINUR negative 12/11/2018 1645   BILIRUBINUR Negative 01/13/2014 1418   KETONESUR 5 (A) 11/09/2019 1808   PROTEINUR 100 (A) 11/09/2019 1808   UROBILINOGEN 0.2 12/11/2018 1645   UROBILINOGEN 0.2 03/23/2016 1215   NITRITE NEGATIVE 11/09/2019 1808   LEUKOCYTESUR NEGATIVE 11/09/2019 1808   LEUKOCYTESUR Negative 01/13/2014 1418   Sepsis Labs Invalid input(s): PROCALCITONIN,  WBC,  LACTICIDVEN Microbiology Recent Results (from the past 240 hour(s))  Blood culture (routine x 2)  Status: None   Collection Time: 11/09/19  8:21 PM   Specimen: BLOOD  Result Value Ref Range Status   Specimen Description BLOOD RIGHT ANTECUBITAL  Final   Special Requests   Final    BOTTLES DRAWN AEROBIC AND ANAEROBIC Blood Culture adequate volume   Culture   Final    NO GROWTH 5 DAYS Performed at [facility], [address] [place], [place] [address]    Report Status 11/14/2019 FINAL  Final  Urine culture     Status: Abnormal   Collection Time: 11/09/19  8:58 PM   Specimen: In/Out Cath Urine  Result Value Ref Range Status   Specimen Description IN/OUT CATH URINE  Final   Special Requests   Final    NONE Performed at [facility], [place] [address] [place] [address] [place], [place] [address]    Culture MULTIPLE SPECIES PRESENT, SUGGEST RECOLLECTION (A)  Final   Report Status 11/11/2019 FINAL  Final  SARS CORONAVIRUS 2 (TAT 6-24 HRS)     Status: None   Collection Time: 11/10/19 12:31 AM  Result Value Ref Range Status   SARS Coronavirus 2 NEGATIVE NEGATIVE Final    Comment: (NOTE) SARS-CoV-2 target nucleic acids are NOT DETECTED. The SARS-CoV-2 RNA is generally detectable in upper and lower respiratory specimens during the acute phase of infection. Negative results do not preclude SARS-CoV-2 infection, do not rule  out co-infections with other pathogens, and should not be used as the sole basis for treatment or other patient management decisions. Negative results must be combined with clinical observations, patient history, and epidemiological information. The expected result is Negative. Fact Sheet for Patients: SugarRoll.be Fact Sheet for Healthcare Providers: https://www.woods-mathews.com/ This test is not yet approved or cleared by the [place] FDA and  has been authorized for detection and/or diagnosis of SARS-CoV-2 by FDA under an Emergency Use Authorization (EUA). This EUA will remain  in effect (meaning this test can be used) for the duration of the COVID-19 declaration under Section 56 4(b)(1) of the Act, 21 U.S.C. section 360bbb-3(b)(1), unless the authorization is terminated or revoked sooner. Performed at [facility], [place] [address], [place], [place] [address]      Time coordinating discharge: 40 minutes  SIGNED:   Elmarie Shiley, MD  [facility]

## 2019-12-09 ENCOUNTER — Encounter (HOSPITAL_COMMUNITY): Payer: Self-pay

## 2019-12-09 ENCOUNTER — Emergency Department (HOSPITAL_COMMUNITY): Payer: BLUE CROSS/BLUE SHIELD

## 2019-12-09 ENCOUNTER — Observation Stay (HOSPITAL_BASED_OUTPATIENT_CLINIC_OR_DEPARTMENT_OTHER)
Admission: AD | Admit: 2019-12-09 | Discharge: 2019-12-10 | Disposition: A | Discharge status: Home / Self Care | Payer: BLUE CROSS/BLUE SHIELD | Source: Intra-hospital | Attending: Psychiatry | Admitting: Psychiatry

## 2019-12-09 ENCOUNTER — Other Ambulatory Visit: Payer: Self-pay

## 2019-12-09 ENCOUNTER — Ambulatory Visit (HOSPITAL_COMMUNITY)
Admission: RE | Admit: 2019-12-09 | Discharge: 2019-12-09 | Disposition: A | Discharge status: Home / Self Care | Payer: BLUE CROSS/BLUE SHIELD | Source: Home / Self Care | Attending: Psychiatry | Admitting: Psychiatry

## 2019-12-09 ENCOUNTER — Emergency Department (HOSPITAL_COMMUNITY)
Admission: EM | Admit: 2019-12-09 | Discharge: 2019-12-09 | Disposition: A | Discharge status: Nursing Home (Includes ICF) | Payer: BLUE CROSS/BLUE SHIELD | Attending: Emergency Medicine | Admitting: Emergency Medicine

## 2019-12-09 DIAGNOSIS — Z008 Encounter for other general examination: Secondary | ICD-10-CM | POA: Insufficient documentation

## 2019-12-09 DIAGNOSIS — F1120 Opioid dependence, uncomplicated: Secondary | ICD-10-CM | POA: Diagnosis present

## 2019-12-09 DIAGNOSIS — Y929 Unspecified place or not applicable: Secondary | ICD-10-CM | POA: Diagnosis not present

## 2019-12-09 DIAGNOSIS — F17210 Nicotine dependence, cigarettes, uncomplicated: Secondary | ICD-10-CM | POA: Insufficient documentation

## 2019-12-09 DIAGNOSIS — F1920 Other psychoactive substance dependence, uncomplicated: Secondary | ICD-10-CM | POA: Diagnosis present

## 2019-12-09 DIAGNOSIS — S0990XA Unspecified injury of head, initial encounter: Secondary | ICD-10-CM | POA: Insufficient documentation

## 2019-12-09 DIAGNOSIS — Z7901 Long term (current) use of anticoagulants: Secondary | ICD-10-CM | POA: Insufficient documentation

## 2019-12-09 DIAGNOSIS — Y9389 Activity, other specified: Secondary | ICD-10-CM | POA: Diagnosis not present

## 2019-12-09 DIAGNOSIS — Z8616 Personal history of COVID-19: Secondary | ICD-10-CM | POA: Diagnosis not present

## 2019-12-09 DIAGNOSIS — F322 Major depressive disorder, single episode, severe without psychotic features: Secondary | ICD-10-CM | POA: Diagnosis present

## 2019-12-09 DIAGNOSIS — T1491XA Suicide attempt, initial encounter: Secondary | ICD-10-CM

## 2019-12-09 DIAGNOSIS — Z79899 Other long term (current) drug therapy: Secondary | ICD-10-CM | POA: Diagnosis not present

## 2019-12-09 DIAGNOSIS — Z8249 Family history of ischemic heart disease and other diseases of the circulatory system: Secondary | ICD-10-CM | POA: Insufficient documentation

## 2019-12-09 DIAGNOSIS — I4891 Unspecified atrial fibrillation: Secondary | ICD-10-CM | POA: Insufficient documentation

## 2019-12-09 DIAGNOSIS — F1910 Other psychoactive substance abuse, uncomplicated: Secondary | ICD-10-CM | POA: Insufficient documentation

## 2019-12-09 DIAGNOSIS — F1190 Opioid use, unspecified, uncomplicated: Secondary | ICD-10-CM | POA: Insufficient documentation

## 2019-12-09 DIAGNOSIS — F329 Major depressive disorder, single episode, unspecified: Secondary | ICD-10-CM | POA: Insufficient documentation

## 2019-12-09 DIAGNOSIS — G4700 Insomnia, unspecified: Secondary | ICD-10-CM | POA: Diagnosis present

## 2019-12-09 DIAGNOSIS — F332 Major depressive disorder, recurrent severe without psychotic features: Secondary | ICD-10-CM | POA: Insufficient documentation

## 2019-12-09 DIAGNOSIS — Z885 Allergy status to narcotic agent status: Secondary | ICD-10-CM | POA: Insufficient documentation

## 2019-12-09 DIAGNOSIS — Z20822 Contact with and (suspected) exposure to covid-19: Secondary | ICD-10-CM | POA: Insufficient documentation

## 2019-12-09 DIAGNOSIS — X80XXXA Intentional self-harm by jumping from a high place, initial encounter: Secondary | ICD-10-CM | POA: Insufficient documentation

## 2019-12-09 DIAGNOSIS — F1994 Other psychoactive substance use, unspecified with psychoactive substance-induced mood disorder: Secondary | ICD-10-CM | POA: Diagnosis present

## 2019-12-09 DIAGNOSIS — R45851 Suicidal ideations: Secondary | ICD-10-CM | POA: Insufficient documentation

## 2019-12-09 DIAGNOSIS — Y999 Unspecified external cause status: Secondary | ICD-10-CM | POA: Insufficient documentation

## 2019-12-09 DIAGNOSIS — D573 Sickle-cell trait: Secondary | ICD-10-CM | POA: Insufficient documentation

## 2019-12-09 DIAGNOSIS — F419 Anxiety disorder, unspecified: Secondary | ICD-10-CM | POA: Diagnosis present

## 2019-12-09 DIAGNOSIS — R450 Nervousness: Secondary | ICD-10-CM | POA: Insufficient documentation

## 2019-12-09 LAB — CBC WITH DIFFERENTIAL/PLATELET
Abs Immature Granulocytes: 0.03 10*3/uL (ref 0.00–0.07)
Basophils Absolute: 0 10*3/uL (ref 0.0–0.1)
Basophils Relative: 0 %
Eosinophils Absolute: 0.3 10*3/uL (ref 0.0–0.5)
Eosinophils Relative: 4 %
HCT: 40.3 % (ref 39.0–52.0)
Hemoglobin: 13.4 g/dL (ref 13.0–17.0)
Immature Granulocytes: 0 %
Lymphocytes Relative: 34 %
Lymphs Abs: 2.9 10*3/uL (ref 0.7–4.0)
MCH: 27.5 pg (ref 26.0–34.0)
MCHC: 33.3 g/dL (ref 30.0–36.0)
MCV: 82.6 fL (ref 80.0–100.0)
Monocytes Absolute: 0.6 10*3/uL (ref 0.1–1.0)
Monocytes Relative: 7 %
Neutro Abs: 4.6 10*3/uL (ref 1.7–7.7)
Neutrophils Relative %: 55 %
Platelets: 273 10*3/uL (ref 150–400)
RBC: 4.88 MIL/uL (ref 4.22–5.81)
RDW: 14.9 % (ref 11.5–15.5)
WBC: 8.5 10*3/uL (ref 4.0–10.5)
nRBC: 0 % (ref 0.0–0.2)

## 2019-12-09 LAB — COMPREHENSIVE METABOLIC PANEL
ALT: 11 U/L (ref 0–44)
AST: 15 U/L (ref 15–41)
Albumin: 4.3 g/dL (ref 3.5–5.0)
Alkaline Phosphatase: 50 U/L (ref 38–126)
Anion gap: 10 (ref 5–15)
BUN: 10 mg/dL (ref 6–20)
CO2: 26 mmol/L (ref 22–32)
Calcium: 9.1 mg/dL (ref 8.9–10.3)
Chloride: 103 mmol/L (ref 98–111)
Creatinine, Ser: 0.87 mg/dL (ref 0.61–1.24)
GFR calc Af Amer: >60 mL/min (ref >60)
GFR calc non Af Amer: >60 mL/min (ref >60)
Glucose, Bld: 118 mg/dL — ABNORMAL HIGH (ref 70–99)
Potassium: 3.6 mmol/L (ref 3.5–5.1)
Sodium: 139 mmol/L (ref 135–145)
Total Bilirubin: 0.5 mg/dL (ref 0.3–1.2)
Total Protein: 7.4 g/dL (ref 6.5–8.1)

## 2019-12-09 LAB — RAPID URINE DRUG SCREEN, HOSP PERFORMED
Amphetamines: POSITIVE — AB
Barbiturates: NOT DETECTED
Benzodiazepines: NOT DETECTED
Cocaine: NOT DETECTED
Opiates: POSITIVE — AB
Tetrahydrocannabinol: NOT DETECTED

## 2019-12-09 LAB — SALICYLATE LEVEL: Salicylate Lvl: <7 mg/dL — ABNORMAL LOW (ref 7.0–30.0)

## 2019-12-09 LAB — RESPIRATORY PANEL BY RT PCR (FLU A&B, COVID)
Influenza A by PCR: NEGATIVE
Influenza B by PCR: NEGATIVE
SARS Coronavirus 2 by RT PCR: NEGATIVE

## 2019-12-09 LAB — POC SARS CORONAVIRUS 2 AG -  ED: SARS Coronavirus 2 Ag: NEGATIVE

## 2019-12-09 LAB — ETHANOL: Alcohol, Ethyl (B): <10 mg/dL (ref <10)

## 2019-12-09 LAB — ACETAMINOPHEN LEVEL: Acetaminophen (Tylenol), Serum: <10 ug/mL — ABNORMAL LOW (ref 10–30)

## 2019-12-09 MED ORDER — GABAPENTIN 300 MG PO CAPS
300.0000 mg | ORAL_CAPSULE | Freq: Three times a day (TID) | ORAL | Status: DC
  Administered 2019-12-09 (×3): 300 mg via ORAL
  Filled 2019-12-09 (×3): qty 1

## 2019-12-09 MED ORDER — RIVAROXABAN 20 MG PO TABS
20.0000 mg | ORAL_TABLET | Freq: Every day | ORAL | Status: DC
  Administered 2019-12-09: 20 mg via ORAL
  Filled 2019-12-09: qty 1

## 2019-12-09 MED ORDER — POLYVINYL ALCOHOL 1.4 % OP SOLN: 1.0000 [drp] | Freq: Three times a day (TID) | OPHTHALMIC | Status: DC | PRN

## 2019-12-09 NOTE — ED Provider Notes (Signed)
TIME SEEN: 1:58 AM  CHIEF COMPLAINT: suicide attempt  HPI: Patient is a 39 year old male with history of PE and DVT on Xarelto, sickle cell trait, chronic back pain who presents to the emergency department with complaints of suicidal thoughts and a suicide attempt tonight.  States that he got into an argument with his mother and father and wanted to kill himself.  States initially he wanted to drive his car off of a bridge.  He states he grabbed his car keys but his nephew took his car keys away from him.  States next he made a noose and tried to put it around his neck and jump off the roof of a one-story house.  He states on the way up the ladder his father and nephew grabbed his legs and stopped him.  He states he jumped off the ladder hitting his head on the ground.  States he thinks he was almost all the way up on the ladder approximately 8 to 10 feet off the ground.  Denies loss of consciousness.  Complaining of back pain but states he thinks this is his chronic pain.  He appears intoxicated today.  States he snorted fentanyl earlier today due to his chronic back pain.  Denies any other illicit drug use or alcohol use.  No chest pain or abdominal pain.  No numbness, tingling or weakness.  He is on Xarelto but states he has missed his last several doses.  [facility] and sent here for medical clearance.   ROS: See HPI Constitutional: no fever  Eyes: no drainage  ENT: no runny nose   Cardiovascular:  no chest pain  Resp: no SOB  GI: no vomiting GU: no dysuria Integumentary: no rash  Allergy: no hives  Musculoskeletal: no leg swelling  Neurological: no slurred speech ROS otherwise negative  PAST MEDICAL HISTORY/PAST SURGICAL HISTORY:  Past Medical History:  Diagnosis Date  . Anxiety   . Back injury   . Cervical disc disease   . Chronic back pain   . Chronic low back pain 03/29/2016  . Family history of adverse reaction to anesthesia   . GERD  (gastroesophageal reflux disease)   . History of kidney stones    per mother- "there was a diagnoses at some point"  . Insomnia   . Lumbar disc disease   . MIGRAINE, COMMON 09/23/2010   Qualifier: Diagnosis of  By: Jenny Reichmann MD, Hunt Oris   . Pneumonia   . Sickle cell trait (HCC)     MEDICATIONS:  Prior to Admission medications   Medication Sig Start Date End Date Taking? Authorizing Provider  amoxicillin-clavulanate (AUGMENTIN) 875-125 MG tablet Take 1 tablet by mouth every 12 (twelve) hours. 11/14/19   Regalado, Belkys A, MD  diazepam (VALIUM) 5 MG tablet Take 1 tablet (5 mg total) by mouth 2 (two) times daily. Patient not taking: Reported on 11/10/2019 08/24/19   Isla Pence, MD  gabapentin (NEURONTIN) 300 MG capsule Take 1 capsule (300 mg total) by mouth 3 (three) times daily. 11/14/19   Regalado, Belkys A, MD  ondansetron (ZOFRAN ODT) 4 MG disintegrating tablet Take 1 tablet (4 mg total) by mouth every 8 (eight) hours as needed. Patient taking differently: Take 4 mg by mouth every 8 (eight) hours as needed for nausea or vomiting.  08/24/19   Isla Pence, MD  pantoprazole (PROTONIX) 40 MG tablet Take 1 tablet (40 mg total) by mouth 2 (two) times daily before a meal. 11/14/19  Regalado, Belkys A, MD  rivaroxaban (XARELTO) 20 MG TABS tablet Take 1 tablet (20 mg total) by mouth daily with supper. 04/11/19   Minette Brine, FNP    ALLERGIES:  Allergies  Allergen Reactions  . Morphine And Related Swelling  . Other Hives and Swelling    Antibiotic that was given when he had pneumonia     SOCIAL HISTORY:  Social History   Tobacco Use  . Smoking status: Current Every Day Smoker    Packs/day: 1.00    Years: 24.00    Pack years: 24.00    Types: Cigarettes  . Smokeless tobacco: Never Used  . Tobacco comment: notified primary care provider and CCM RNCM  Substance Use Topics  . Alcohol use: Yes    Comment: former use - heavy drinker 8 years ago    FAMILY HISTORY: Family History   Problem Relation Age of Onset  . Congestive Heart Failure Father   . Hypertension Mother   . Hyperlipidemia Mother   . Stroke Mother     EXAM: BP 110/74 (BP Location: Left Arm)   Pulse 69   Temp 98.5 F (36.9 C) (Oral)   Resp 15   SpO2 97%  CONSTITUTIONAL: Alert and oriented and responds appropriately to questions.  Appears older than stated age.  Chronically ill-appearing.  Appears intoxicated.  GCS 15. HEAD: Normocephalic; atraumatic EYES: Conjunctivae clear, PERRL, EOMI ENT: normal nose; no rhinorrhea; moist mucous membranes; pharynx without lesions noted; no dental injury; no septal hematoma, multiple superficial wounds without signs of superimposed infection to the face from picking, actively picking at skin from his forehead while I am in the room NECK: Supple, no meningismus, no LAD; no midline spinal tenderness, step-off or deformity; trachea midline, has areas of skin that he has been picking with superficial wounds without signs of superimposed infection to the posterior neck CARD: RRR; S1 and S2 appreciated; no murmurs, no clicks, no rubs, no gallops RESP: Normal chest excursion without splinting or tachypnea; breath sounds clear and equal bilaterally; no wheezes, no rhonchi, no rales; no hypoxia or respiratory distress CHEST:  chest wall stable, no crepitus or ecchymosis or deformity, nontender to palpation; no flail chest ABD/GI: Normal bowel sounds; non-distended; soft, non-tender, no rebound, no guarding; no ecchymosis or other lesions noted PELVIS:  stable, nontender to palpation BACK:  The back appears normal and is tender over the thoracic and lumbar spine without step-off or deformity, no ecchymosis, no redness or warmth, no soft tissue swelling EXT: Normal ROM in all joints; non-tender to palpation; no edema; normal capillary refill; no cyanosis, no bony tenderness or bony deformity of patient's extremities, no joint effusion, compartments are soft, extremities are warm  and well-perfused, no ecchymosis SKIN: Normal color for age and race; warm NEURO: Moves all extremities equally, reports normal sensation diffusely, ambulatory in the room to enter without difficulty, ataxia or antalgic gait.  Reports normal sensation diffusely.  Speech is clear.  No facial asymmetry. PSYCH: Patient endorses suicidal thoughts with suicide attempt tonight.  Poor eye contact.  MEDICAL DECISION MAKING: Patient here after suicide attempt.  Appears intoxicated.  Reports snorting fentanyl earlier today.  Denies any other coingestions.  Sent here from behavioral health for medical clearance.  Will obtain CT of the head given he reports he hit his head and is on Xarelto intermittently.  Will obtain CT of the cervical spine given he appears intoxicated as well as x-rays of thoracic and lumbar spine.  Neurologically intact here.  No other  sign of injury on exam.  Will obtain screening labs, urine.  TTS plans to reevaluate patient in the morning once medically cleared.  ED PROGRESS: Patient's labs are unremarkable.  His drug screen is positive for opiates and amphetamines.  His imaging shows no acute traumatic injury today.  Patient currently medically cleared.  He has been evaluated by TTS at behavioral health and plan is for psychiatry to reevaluate in the morning.  Patient reports he is on Suboxone but it appears that this was last filled on 10/10/2019 for 15 days worth by provider in [place].  It does not appear he has received any subsequent prescriptions.  I do not feel this is currently indicated.  He also reports he is on Valium but there are no indications that this has been prescribed to him.  I reviewed all nursing notes and pertinent previous records as available.  I have interpreted any EKGs, lab and urine results, imaging (as available).   Rozelle Yuill was evaluated in Emergency Department on 12/09/2019 for the symptoms described in the history of present illness. He was evaluated  in the context of the global COVID-19 pandemic, which necessitated consideration that the patient might be at risk for infection with the SARS-CoV-2 virus that causes COVID-19. Institutional protocols and algorithms that pertain to the evaluation of patients at risk for COVID-19 are in a state of rapid change based on information released by regulatory bodies including the CDC and federal and state organizations. These policies and algorithms were followed during the patient's care in the ED.  Patient was seen wearing N95, face shield, gloves.     Daryana Whirley, Delice Bison, DO 12/09/19 [phone]

## 2019-12-09 NOTE — ED Notes (Signed)
Patient seems very sleepy, doesn't open eyes, but oriented. Asked if he had any alcohol, he denies drinking. Says he took his regular oxycodone but that he took Fentanyl earlier that he does not have a prescription for.

## 2019-12-09 NOTE — H&P (Signed)
Behavioral Health Medical Screening Exam  Jason [name] is an 39 y.o. male, who presents voluntarily and unaccompanied to [facility]. The patient voiced, he attempted to commit suicide earlier in the night 12/08/19. He narrated that he got in his truck to drive off a bridge, but his nephew and mother stopped him. The patient stated he got into an altercation with his mother and father; "my life has been difficult the past few years." When his nephew took his keys, the patient acted like he was going into the house. The patient reported a long extension cord was on the porch, and he tried to use that to hang himself. The patient stated his nephew tried stopping him and, the police were called. The patient presents with open sores on the back of his neck. He denies that it was from the incident from tonight.  The patient was seen face-to-face on evaluation; the patient is alert and oriented x3, calm,  and cooperative, and mood-congruent with affect. The patient is positive for opiates and amphetamines, and during the assessment, he did not open his eyes. He continues to sway from side to side. He denies using any illicit drugs. He voiced he used fentanyl 50 mcg four hours before coming to the hospital. The patient does not appear to be responding to internal or external stimuli. Neither is the patient presenting with any delusional thinking. The patient denies auditory or visual hallucinations. The patient denies suicidal, homicidal, or self-harm ideations. The patient is not presenting with any psychotic or paranoid behaviors. During an encounter with the patient, he was able to answer questions post to him.   Total Time spent with patient: 45 minutes  Psychiatric Specialty Exam: Physical Exam  Nursing note and vitals reviewed. Constitutional: He is oriented to person, place, and time.  Cardiovascular: Normal rate.  Respiratory: Effort normal.  Musculoskeletal:        General: Normal range of motion.      Cervical back: Normal range of motion and neck supple.  Neurological: He is alert and oriented to person, place, and time.    Review of Systems  Psychiatric/Behavioral: Positive for behavioral problems, decreased concentration and suicidal ideas. The patient is nervous/anxious.   All other systems reviewed and are negative.   Blood pressure 111/77, pulse 98, temperature 98.4 F (36.9 C), temperature source Oral, resp. rate 20, SpO2 97 %.There is no height or weight on file to calculate BMI.  General Appearance: Bizarre and Disheveled  Eye Contact:  None  Speech:  Slow  Volume:  Decreased  Mood:  Dysphoric  Affect:  Congruent and Flat  Thought Process:  Coherent  Orientation:  Full (Time, Place, and Person)  Thought Content:  Logical and Rumination  Suicidal Thoughts:  Yes.  with intent/plan  Homicidal Thoughts:  No  Memory:  Immediate;   Fair Recent;   Fair Remote;   Fair  Judgement:  Impaired  Insight:  Lacking  Psychomotor Activity:  Patient is under the influence of a substance  Concentration: Concentration: Poor and Attention Span: Poor  Recall:  [place]: Fair  Akathisia:  Negative  Handed:  Right  AIMS (if indicated):     Assets:  Communication Skills Desire for Improvement Resilience Social Support  Sleep:    okay    Musculoskeletal: Strength & Muscle Tone: within normal limits Gait & Station: normal Patient leans: Backward  Blood pressure 111/77, pulse 98, temperature 98.4 F (36.9 C), temperature source Oral, resp. rate  20, SpO2 97 %.  Recommendations:  Based on my evaluation the patient does not appear to have an emergency medical condition. The patient is positive for opiates and amphetamines therefore he needs to be re-assess once he is not under the influence.  Caroline Sauger, NP 12/09/2019, 4:49 AM

## 2019-12-09 NOTE — Discharge Summary (Signed)
  Patient to be transferred to [facility] 

## 2019-12-09 NOTE — Progress Notes (Signed)
Patient just picked up by [facility] to take patient to [facility].

## 2019-12-09 NOTE — BHH Counselor (Signed)
Clinician spoke to Dr. Leonides Schanz to express the pt was seen at [facility] as a walk-in, needs medical clearance and will be reassessed by psychiatry in the morning.    Vertell Novak, [place], [facility], [facility] Triage Specialist [phone]

## 2019-12-09 NOTE — ED Triage Notes (Signed)
Patient arrived stating he tried to kill himself by jumping off a roof tonight, states he has been struggling with a depression for a while now but got in an argument that pushed him to attempt suicide tonight. Reporting neck and back pain that is chronic but stating he has a headache from the fall. Patient ambulatory but appears drowsy. States he takes Fentanyl daily.

## 2019-12-09 NOTE — Consult Note (Signed)
  Jason [name], 39 y.o., male patient seen via tele psych by this provider, consulted with Dr. Dwyane Dee; and chart reviewed on 12/09/19.  On evaluation Jason [name] reports he came to the hospital because he was having suicidal thoughts with a plan to jump off the top of house.  States that he had an argument with his mother who said things that make him feel worthless "Like I'm just like my father and that I am a junky."  Patient states that he uses fentanyl and that is what his mother was arguing about.  Patient's UDS also positive for amphetamines but patient denies use.  Patient denies homicidal ideations, psychosis, paranoia.  Patient continues to endorse suicidal ideation.  Patient asked about rehab services but stated he did not know if that is what he wanted.  Patient also reports that he has 1 prior suicide attempt back in 2006 when he attempted to jump off a bridge, but denies any psychiatric hospitalizations, outpatient psychiatric services, or rehab.  Patient very limited with information he would give as to what occurred yesterday prior to hospitalization other than the argument with his mother. During evaluation Jason [name] is alert/oriented x 4; calm/cooperative; and mood is congruent with affect.  He does not appear to be responding to internal/external stimuli or delusional thoughts.  Patient denies homicidal ideation, psychosis, and paranoia, but continues to endorse suicidal ideation.  Patient answered question appropriately.  Patient will not contract for safety.  Peer Support to be ordered.      Medication Management:   Will start Prozac 20 mg daily for depression  Disposition:   Observation.  If bed available at [facility] admit to Observation Unit.

## 2019-12-09 NOTE — BHH Counselor (Signed)
Pt was assessed at [facility] as a walk-in, another assessment is not needed.    Vertell Novak, [place], [facility], [facility] Triage Specialist [phone]

## 2019-12-09 NOTE — ED Notes (Signed)
Patient returned from CT, dressed in hospital scrubs and possessions and cords removed from room. Security called for wanding, telepsych cart in hall outside of room.

## 2019-12-09 NOTE — H&P (Signed)
[facility] Provider Admission PAA/H&P  Patient Identification: Jason [name] MRN:  [id number] Date of Evaluation:  12/09/2019 Chief Complaint:  mdd recurrent severe without psychotic features  opiod use disorder Principal Diagnosis: Substance induced mood disorder ([place]) Diagnosis:  Principal Problem:   Substance induced mood disorder ([place]) Active Problems:   Anxiety   Insomnia   Polysubstance dependence including opioid type drug, episodic abuse ([place])  History of Present Illness:  Jason [name], 39 y.o., male patient seen via tele psych by this provider, consulted with Dr. Dwyane Dee; and chart reviewed on 12/09/19.  On evaluation Jason [name] reports he came to the hospital because he was having suicidal thoughts with a plan to jump off the top of house.  States that he had an argument with his mother who said things that make him feel worthless "Like I'm just like my father and that I am a junky."  Patient states that he uses fentanyl and that is what his mother was arguing about.  Patient's UDS also positive for amphetamines but patient denies use.  Patient denies homicidal ideations, psychosis, paranoia.  Patient continues to endorse suicidal ideation.  Patient asked about rehab services but stated he did not know if that is what he wanted.  Patient also reports that he has 1 prior suicide attempt back in 2006 when he attempted to jump off a bridge, but denies any psychiatric hospitalizations, outpatient psychiatric services, or rehab.  Patient very limited with information he would give as to what occurred yesterday prior to hospitalization other than the argument with his mother. During evaluation Jason [name] is alert/oriented x 4; calm/cooperative; and mood is congruent with affect.  He does not appear to be responding to internal/external stimuli or delusional thoughts.  Patient denies homicidal ideation, psychosis, and paranoia, but continues to endorse suicidal ideation.   Patient answered question appropriately.  Patient will not contract for safety.  Peer Support to be ordered.     Associated Signs/Symptoms: Depression Symptoms:  depressed mood, suicidal thoughts with specific plan, (Hypo) Manic Symptoms:  Impulsivity, Irritable Mood, Anxiety Symptoms:  Excessive Worry, Psychotic Symptoms:  Denies PTSD Symptoms: NA Total Time spent with patient: 30 minutes  Past Psychiatric History: Polysubstance abuse, substance induce mood disorder, depression  Is the patient at risk to self? Yes.    Has the patient been a risk to self in the past 6 months? Yes.    Has the patient been a risk to self within the distant past? Yes.    Is the patient a risk to others? No.  Has the patient been a risk to others in the past 6 months? No.  Has the patient been a risk to others within the distant past? No.   Prior Inpatient Therapy:    Denies Prior Outpatient Therapy:  Denies   Alcohol Screening:   Substance Abuse History in the last 12 months:  Yes.   Consequences of Substance Abuse: Family Consequences:  Family discord Previous Psychotropic Medications: No  Psychological Evaluations: No  Past Medical History:  Past Medical History:  Diagnosis Date  . Anxiety   . Back injury   . Cervical disc disease   . Chronic back pain   . Chronic low back pain 03/29/2016  . Family history of adverse reaction to anesthesia   . GERD (gastroesophageal reflux disease)   . History of kidney stones    per mother- "there was a diagnoses at some point"  . Insomnia   . Lumbar disc disease   .  MIGRAINE, COMMON 09/23/2010   Qualifier: Diagnosis of  By: Jenny Reichmann MD, Hunt Oris   . Pneumonia   . Sickle cell trait [facility])     Past Surgical History:  Procedure Laterality Date  . BACK SURGERY    . BIOPSY  11/12/2019   Procedure: BIOPSY;  Surgeon: Thornton Park, MD;  Location: [place];  Service: Gastroenterology;;  . ESOPHAGOGASTRODUODENOSCOPY (EGD) WITH PROPOFOL N/A 11/12/2019    Procedure: ESOPHAGOGASTRODUODENOSCOPY (EGD) WITH PROPOFOL;  Surgeon: Thornton Park, MD;  Location: [place];  Service: Gastroenterology;  Laterality: N/A;  . LUMBAR LAMINECTOMY/DECOMPRESSION MICRODISCECTOMY Bilateral 08/28/2017   Procedure: BILATERAL PARTIAL HEMILAMINECTOMIES L4-5 WITH MICRODISCECTOMY;  Surgeon: Jessy Oto, MD;  Location: [place];  Service: Orthopedics;  Laterality: Bilateral;  . NO PAST SURGERIES     Family History:  Family History  Problem Relation Age of Onset  . Congestive Heart Failure Father   . Hypertension Mother   . Hyperlipidemia Mother   . Stroke Mother    Family Psychiatric History: Denies  Tobacco Screening:   Social History:  Social History   Substance and Sexual Activity  Alcohol Use Yes   Comment: former use - heavy drinker 8 years ago     Social History   Substance and Sexual Activity  Drug Use No    Additional Social History:  Allergies:   Allergies  Allergen Reactions  . Morphine And Related Swelling  . Other Hives and Swelling    Antibiotic that was given when he had pneumonia    Lab Results:  Results for orders placed or performed during the hospital encounter of 12/09/19 (from the past 48 hour(s))  Comprehensive metabolic panel     Status: Abnormal   Collection Time: 12/09/19  2:20 AM  Result Value Ref Range   Sodium 139 135 - 145 mmol/L   Potassium 3.6 3.5 - 5.1 mmol/L   Chloride 103 98 - 111 mmol/L   CO2 26 22 - 32 mmol/L   Glucose, Bld 118 (H) 70 - 99 mg/dL   BUN 10 6 - 20 mg/dL   Creatinine, Ser 0.87 0.61 - 1.24 mg/dL   Calcium 9.1 8.9 - 10.3 mg/dL   Total Protein 7.4 6.5 - 8.1 g/dL   Albumin 4.3 3.5 - 5.0 g/dL   AST 15 15 - 41 U/L   ALT 11 0 - 44 U/L   Alkaline Phosphatase 50 38 - 126 U/L   Total Bilirubin 0.5 0.3 - 1.2 mg/dL   GFR calc non Af Amer >60 >60 mL/min   GFR calc Af Amer >60 >60 mL/min   Anion gap 10 5 - 15    Comment: Performed at [facility], [place] [address],  [place], [place] [address]  Ethanol     Status: None   Collection Time: 12/09/19  2:20 AM  Result Value Ref Range   Alcohol, Ethyl (B) <10 <10 mg/dL    Comment: (NOTE) Lowest detectable limit for serum alcohol is 10 mg/dL. For medical purposes only. Performed at [facility], [place] [address] [place], [place] [address]   Urine rapid drug screen (hosp performed)     Status: Abnormal   Collection Time: 12/09/19  2:20 AM  Result Value Ref Range   Opiates POSITIVE (A) NONE DETECTED   Cocaine NONE DETECTED NONE DETECTED   Benzodiazepines NONE DETECTED NONE DETECTED   Amphetamines POSITIVE (A) NONE DETECTED   Tetrahydrocannabinol NONE DETECTED NONE DETECTED   Barbiturates NONE DETECTED NONE DETECTED    Comment: (  NOTE) DRUG SCREEN FOR MEDICAL PURPOSES ONLY.  IF CONFIRMATION IS NEEDED FOR ANY PURPOSE, NOTIFY LAB WITHIN 5 DAYS. LOWEST DETECTABLE LIMITS FOR URINE DRUG SCREEN Drug Class                     Cutoff (ng/mL) Amphetamine and metabolites    1000 Barbiturate and metabolites    200 Benzodiazepine                 [id number] Tricyclics and metabolites     300 Opiates and metabolites        300 Cocaine and metabolites        300 THC                            50 Performed at [facility], [place] [address] [place], [place] [address]   CBC with Diff     Status: None   Collection Time: 12/09/19  2:20 AM  Result Value Ref Range   WBC 8.5 4.0 - 10.5 K/uL   RBC 4.88 4.22 - 5.81 MIL/uL   Hemoglobin 13.4 13.0 - 17.0 g/dL   HCT 40.3 39.0 - 52.0 %   MCV 82.6 80.0 - 100.0 fL   MCH 27.5 26.0 - 34.0 pg   MCHC 33.3 30.0 - 36.0 g/dL   RDW 14.9 11.5 - 15.5 %   Platelets 273 150 - 400 K/uL   nRBC 0.0 0.0 - 0.2 %   Neutrophils Relative % 55 %   Neutro Abs 4.6 1.7 - 7.7 K/uL   Lymphocytes Relative 34 %   Lymphs Abs 2.9 0.7 - 4.0 K/uL   Monocytes Relative 7 %   Monocytes Absolute 0.6 0.1 - 1.0 K/uL   Eosinophils Relative 4 %   Eosinophils Absolute 0.3 0.0 - 0.5  K/uL   Basophils Relative 0 %   Basophils Absolute 0.0 0.0 - 0.1 K/uL   Immature Granulocytes 0 %   Abs Immature Granulocytes 0.03 0.00 - 0.07 K/uL    Comment: Performed at [facility], [place] [address], [place], [place] [address]  Acetaminophen level     Status: Abnormal   Collection Time: 12/09/19  2:20 AM  Result Value Ref Range   Acetaminophen (Tylenol), Serum <10 (L) 10 - 30 ug/mL    Comment: (NOTE) Therapeutic concentrations vary significantly. A range of 10-30 ug/mL  may be an effective concentration for many patients. However, some  are best treated at concentrations outside of this range. Acetaminophen concentrations >150 ug/mL at 4 hours after ingestion  and >50 ug/mL at 12 hours after ingestion are often associated with  toxic reactions. Performed at [facility], [place] [address] [place], [place] [id number]   Salicylate level     Status: Abnormal   Collection Time: 12/09/19  2:20 AM  Result Value Ref Range   Salicylate Lvl [id number] (L) 7.0 - 30.0 mg/dL    Comment: Performed at [facility], [place] [address] [place], [place] [address]  POC SARS Coronavirus 2 Ag-ED -     Status: None   Collection Time: 12/09/19  3:06 AM  Result Value Ref Range   SARS Coronavirus 2 Ag NEGATIVE NEGATIVE    Comment: (NOTE) SARS-CoV-2 antigen NOT DETECTED.  Negative results are presumptive.  Negative results do not preclude SARS-CoV-2 infection and should not be used as the sole basis for treatment or other patient management decisions, including infection  control decisions, particularly in the presence of clinical signs and  symptoms consistent with COVID-19, or in those who have been in contact with the virus.  Negative results must be combined with clinical observations, patient history, and epidemiological information. The expected result is Negative. Fact Sheet for Patients: PodPark.tn Fact Sheet for  Healthcare Providers: GiftContent.is This test is not yet approved or cleared by the [place] FDA and  has been authorized for detection and/or diagnosis of SARS-CoV-2 by FDA under an Emergency Use Authorization (EUA).  This EUA will remain in effect (meaning this test can be used) for the duration of  the COVID-19 de claration under Section 564(b)(1) of the Act, 21 U.S.C. section 360bbb-3(b)(1), unless the authorization is terminated or revoked sooner.   Respiratory Panel by RT PCR (Flu A&B, Covid) - Nasopharyngeal Swab     Status: None   Collection Time: 12/09/19  4:30 AM   Specimen: Nasopharyngeal Swab  Result Value Ref Range   SARS Coronavirus 2 by RT PCR NEGATIVE NEGATIVE    Comment: (NOTE) SARS-CoV-2 target nucleic acids are NOT DETECTED. The SARS-CoV-2 RNA is generally detectable in upper respiratoy specimens during the acute phase of infection. The lowest concentration of SARS-CoV-2 viral copies this assay can detect is 131 copies/mL. A negative result does not preclude SARS-Cov-2 infection and should not be used as the sole basis for treatment or other patient management decisions. A negative result may occur with  improper specimen collection/handling, submission of specimen other than nasopharyngeal swab, presence of viral mutation(s) within the areas targeted by this assay, and inadequate number of viral copies (<131 copies/mL). A negative result must be combined with clinical observations, patient history, and epidemiological information. The expected result is Negative. Fact Sheet for Patients:  PinkCheek.be Fact Sheet for Healthcare Providers:  GravelBags.it This test is not yet ap proved or cleared by the [place] FDA and  has been authorized for detection and/or diagnosis of SARS-CoV-2 by FDA under an Emergency Use Authorization (EUA). This EUA will remain  in effect  (meaning this test can be used) for the duration of the COVID-19 declaration under Section 564(b)(1) of the Act, 21 U.S.C. section 360bbb-3(b)(1), unless the authorization is terminated or revoked sooner.    Influenza A by PCR NEGATIVE NEGATIVE   Influenza B by PCR NEGATIVE NEGATIVE    Comment: (NOTE) The Xpert Xpress SARS-CoV-2/FLU/RSV assay is intended as an aid in  the diagnosis of influenza from Nasopharyngeal swab specimens and  should not be used as a sole basis for treatment. Nasal washings and  aspirates are unacceptable for Xpert Xpress SARS-CoV-2/FLU/RSV  testing. Fact Sheet for Patients: PinkCheek.be Fact Sheet for Healthcare Providers: GravelBags.it This test is not yet approved or cleared by the [place] FDA and  has been authorized for detection and/or diagnosis of SARS-CoV-2 by  FDA under an Emergency Use Authorization (EUA). This EUA will remain  in effect (meaning this test can be used) for the duration of the  Covid-19 declaration under Section 564(b)(1) of the Act, 21  U.S.C. section 360bbb-3(b)(1), unless the authorization is  terminated or revoked. Performed at [facility], [place] [address] [place], [place] [address]     Blood Alcohol level:  Lab Results  Component Value Date   [facility] <10 12/09/2019   ETH <10 123XX123    Metabolic Disorder Labs:  Lab Results  Component Value Date   HGBA1C 6.0 (H) 12/11/2018   No results found for: PROLACTIN Lab Results  Component  Value Date   CHOL 161 03/23/2016   TRIG 126.0 03/23/2016   HDL 56.40 03/23/2016   CHOLHDL 3 03/23/2016   VLDL 25.2 03/23/2016   LDLCALC 79 03/23/2016    Current Medications: No current facility-administered medications for this encounter.   Current Outpatient Medications  Medication Sig Dispense Refill  . buprenorphine-naloxone (SUBOXONE) 8-2 mg SUBL SL tablet Place 1 tablet under the tongue every 8  (eight) hours.     . gabapentin (NEURONTIN) 300 MG capsule Take 1 capsule (300 mg total) by mouth 3 (three) times daily. 90 capsule 0  . hydroxypropyl methylcellulose / hypromellose (ISOPTO TEARS / GONIOVISC) 2.5 % ophthalmic solution Place 1 drop into both eyes 3 (three) times daily as needed for dry eyes.    . rivaroxaban (XARELTO) 20 MG TABS tablet Take 1 tablet (20 mg total) by mouth daily with supper. 30 tablet 2  . Zinc Sulfate (ZINC 15 PO) Take 15 mg by mouth daily.     Facility-Administered Medications Ordered in Other Encounters  Medication Dose Route Frequency Provider Last Rate Last Admin  . gabapentin (NEURONTIN) capsule 300 mg  300 mg Oral TID Ward, Kristen N, DO   300 mg at 12/09/19 1528  . polyvinyl alcohol (LIQUIFILM TEARS) 1.4 % ophthalmic solution 1 drop  1 drop Both Eyes TID PRN Ward, Kristen N, DO      . rivaroxaban (XARELTO) tablet 20 mg  20 mg Oral Q supper Ward, Kristen N, DO       PTA Medications: No medications prior to admission.    Musculoskeletal: Strength & Muscle Tone: within normal limits Gait & Station: normal  Patient leans: N/A  Psychiatric Specialty Exam: Physical Exam  Nursing note and vitals reviewed. Constitutional: He is oriented to person, place, and time.  Respiratory: Effort normal.  Musculoskeletal:        General: Normal range of motion.     Cervical back: Normal range of motion.  Neurological: He is alert and oriented to person, place, and time.  Psychiatric: Cognition and memory are normal. He expresses impulsivity. He exhibits a depressed mood. He expresses suicidal ideation. He expresses suicidal plans.    Review of Systems  Psychiatric/Behavioral: Positive for suicidal ideas. The patient is nervous/anxious.   All other systems reviewed and are negative.   There were no vitals taken for this visit.There is no height or weight on file to calculate BMI.  General Appearance: Casual  Eye Contact:  Good  Speech:  Clear and Coherent and  Normal Rate  Volume:  Normal  Mood:  Depressed  Affect:  Congruent  Thought Process:  Coherent, Goal Directed and Descriptions of Associations: Intact  Orientation:  Full (Time, Place, and Person)  Thought Content:  WDL  Suicidal Thoughts:  Yes.  with intent/plan  Homicidal Thoughts:  No  Memory:  Immediate;   Good Recent;   Good  Judgement:  Fair  Insight:  Shallow  Psychomotor Activity:  Normal  Concentration:  Concentration: Good and Attention Span: Good  Recall:  Good  Fund of Knowledge:  Fair  Language:  Good  Akathisia:  No  Handed:  Right  AIMS (if indicated):     Assets:  Communication Skills Desire for Improvement Housing Social Support  ADL's:  Intact  Cognition:  WNL  Sleep:         Treatment Plan Summary: Plan Observation overnight  Observation Level/Precautions:  15 minute checks Laboratory:  CBC Chemistry Profile UDS UA Psychotherapy:  Individual  Medications:  Started  Prozac 20 mg daily for major depression Consultations:  As needed Discharge Concerns:  Safety, stabilization, and access to medication Estimated LOS:  Overnight Observation, will reassess tomorrow morning Other:      Monet North, NP 2/22/20214:06 PM
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
## 2019-12-09 NOTE — BH Assessment (Signed)
[place] Assessment Progress Note  .Per Shuvon Rankin, FNP, this pt would benefit from admission to the [facility] Observation Unit at this time.  Jasmine has assigned pt to Obs Rm 402-1; [facility] will be ready to receive pt at 21:00.  Pt has signed Voluntary Admission and Consent for Treatment, as well as Consent to Release Information to no one, and signed forms have been faxed to [facility].  Pt's nurse, Lisette Grinder, has been notified, and agrees to send original paperwork along with pt via [facility], and to call report to [phone].  Jalene Mullet, [place] Coordinator [phone]

## 2019-12-09 NOTE — Progress Notes (Addendum)
Called report to Eulogio Bear, RN at [facility]. Patient will be going to [facility] Observation Unit 402-1. Left number here in case she had additional questions for me.   Call [facility] to transport patient to [facility]. Terry, with [employer], was in route to pick up patient from [place]. He stated he would call me back to get information due to him driving while on the phone. He stated that I was third on the list to pick up d/t [facility] having a patient going to [facility]. At that point, I shared I had 2 patients that will be going to [facility].

## 2019-12-09 NOTE — ED Notes (Signed)
Patient's mother called, informed that no information could be shared. Patient stated that he did not want to speak with mother. Resting quietly in bed.

## 2019-12-09 NOTE — ED Notes (Signed)
Patient in bed resting quietly

## 2019-12-09 NOTE — Patient Outreach (Signed)
[facility] met with the patient in order to provide substance use recovery support and help with getting connected to substance use recovery resources. Patient reports a history of on and off fentanyl use over the past year to help with chronic back pain related symptoms. UDS was positive for opiates and amphetamines. EtOH test was negative. [facility] asked if the patient was interested in help with a posible dual diagnosis detox treatment referral, but the patient is unsure if he wants help with getting connected to detox treatment at this time. [facility] talked to the patient about [facility] and [facility] both as options for dual diagnosis detox treatment and provided the patient with a detox treatment center list for follow up. CPSS also provided information for other substance use recovery resources including residential/outpatient substance use treatment center list and [facility] contact information. [facility]PSS strongly encouraged the patient to follow up with [facility] if needed for further help with getting connected to substance use recovery resources or any other help with [facility] substance use recovery outreach services.

## 2019-12-10 ENCOUNTER — Encounter (HOSPITAL_COMMUNITY): Payer: Self-pay | Admitting: Psychiatry

## 2019-12-10 DIAGNOSIS — F1994 Other psychoactive substance use, unspecified with psychoactive substance-induced mood disorder: Secondary | ICD-10-CM | POA: Diagnosis not present

## 2019-12-10 DIAGNOSIS — F332 Major depressive disorder, recurrent severe without psychotic features: Secondary | ICD-10-CM | POA: Diagnosis present

## 2019-12-10 MED ORDER — POLYVINYL ALCOHOL 1.4 % OP SOLN: 1.0000 [drp] | Freq: Three times a day (TID) | OPHTHALMIC | Status: DC | PRN

## 2019-12-10 MED ORDER — GABAPENTIN 300 MG PO CAPS
300.0000 mg | ORAL_CAPSULE | Freq: Three times a day (TID) | ORAL | Status: DC
  Administered 2019-12-10 (×2): 300 mg via ORAL
  Filled 2019-12-10 (×2): qty 1

## 2019-12-10 MED ORDER — RIVAROXABAN 20 MG PO TABS
20.0000 mg | ORAL_TABLET | Freq: Every day | ORAL | Status: DC
  Filled 2019-12-10 (×2): qty 1
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
## 2019-12-10 NOTE — Plan of Care (Signed)
Jason [name] Observation Crisis Plan  Reason for Crisis Plan:  Crisis Stabilization   Plan of Care:  Referral for Telepsychiatry/Psychiatric Consult  Family Support:      Current Living Environment:  Living Arrangements: Parent  Insurance:   Hospital Account    Name Acct ID Class Status Primary Coverage   Jason [name], Jason [name] [id number] [facility] NON-PARTICIPATING        Guarantor Account (for Hospital Account [id number])    Name Relation to Pt Service Area Active? Acct Type   Jason [name]   Address Phone       [address]  [place], [place] [address] [phone])          Coverage Information (for Hospital Account [id number])    F/O Payor/Plan Precert #   [facility] SHIELD/[employer]BS[employer] NON-PARTICIPATING    Subscriber Subscriber #   Jason [name] [id number]   Address Phone   [address] [place], [place] [address] [phone]      Legal Guardian:     Primary Care Provider:  Shelby Mattocks, PA-C  Current Outpatient Providers:  pt does not have outpatient provider  Psychiatrist:     Counselor/Therapist:     Compliant with Medications:  Yes  Additional Information:   Jason [name] 2/23/20211:05 AM

## 2019-12-10 NOTE — Progress Notes (Signed)
D: Pt A & O X 3. Denies SI, HI, AVH and pain at this time. Presents guarded with flat affect and logical speech. Reports daily use of Fentanyl 50 mcg "I don't want no detox, I want to go home, y'all need to let me out as soon as you can, call my mama to come pick me up". Emesis X1 consisting of undigested food particles noted in trash can; gingerale, gatorade offered and tolerated well. D/C home as ordered. Picked up in front of facility by his mother.   A: D/C instructions reviewed with pt including follow up appointment; compliance encouraged. All belongings from locker # 38 returned to pt at time of departure. Scheduled medications administered with verbal education and effects monitored. Safety checks maintained at Q 30 minutes intervals without incident till time of d/c.  R: Pt receptive to care. Compliant with medications when offered. Denies adverse drug reactions when assessed. Verbalized understanding related to d/c instructions. Signed belonging sheet in agreement with items received from locker. Ambulatory with a steady gait. Appears to be in no physical distress at time of departure.

## 2019-12-10 NOTE — BH Assessment (Signed)
[place] Assessment Progress Note  Per Johnn Hai, MD, this pt does not require psychiatric hospitalization at this time.  Pt is to be discharged from [facility] with referral information for outpatient psychiatry.  This Probation officer spoke to pt, offering to schedule an intake appointment for the pt but he declined, preferring instead to be provided with resources for follow up at his own initiative.  Discharge instructions offer referral information for several area psychiatrists.  Pt's nurse, Nicoletta Dress, has been notified.  Jalene Mullet, [place] Triage Specialist [phone]
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
## 2019-12-10 NOTE — Discharge Instructions (Signed)
For your behavioral health needs, you are advised to follow up with an outpatient provider.  Contact one of the following providers at your earliest opportunity to schedule an intake appointment:       [facility]. [address] [place], [place] [address]      [phone]       [address] [place], [place] [address]      [phone]       [facility]      [place] [address], [place], [place] [address]      [phone]

## 2019-12-30 ENCOUNTER — Ambulatory Visit: Payer: BLUE CROSS/BLUE SHIELD | Admitting: Gastroenterology

## 2020-01-09 ENCOUNTER — Other Ambulatory Visit: Payer: Self-pay

## 2020-01-09 ENCOUNTER — Encounter (HOSPITAL_COMMUNITY): Payer: Self-pay

## 2020-01-09 ENCOUNTER — Emergency Department (HOSPITAL_COMMUNITY)
Admission: EM | Admit: 2020-01-09 | Discharge: 2020-01-09 | Disposition: A | Discharge status: Home / Self Care | Payer: BLUE CROSS/BLUE SHIELD | Attending: Emergency Medicine | Admitting: Emergency Medicine

## 2020-01-09 ENCOUNTER — Encounter (HOSPITAL_COMMUNITY): Payer: Self-pay | Admitting: Emergency Medicine

## 2020-01-09 ENCOUNTER — Ambulatory Visit (INDEPENDENT_AMBULATORY_CARE_PROVIDER_SITE_OTHER)
Admission: EM | Admit: 2020-01-09 | Discharge: 2020-01-09 | Disposition: A | Discharge status: Home / Self Care | Payer: BLUE CROSS/BLUE SHIELD | Source: Home / Self Care | Attending: Family Medicine | Admitting: Family Medicine

## 2020-01-09 DIAGNOSIS — L03115 Cellulitis of right lower limb: Secondary | ICD-10-CM | POA: Diagnosis not present

## 2020-01-09 DIAGNOSIS — M542 Cervicalgia: Secondary | ICD-10-CM | POA: Insufficient documentation

## 2020-01-09 DIAGNOSIS — M79606 Pain in leg, unspecified: Secondary | ICD-10-CM | POA: Insufficient documentation

## 2020-01-09 DIAGNOSIS — Z79899 Other long term (current) drug therapy: Secondary | ICD-10-CM | POA: Diagnosis not present

## 2020-01-09 MED ORDER — SULFAMETHOXAZOLE-TRIMETHOPRIM 800-160 MG PO TABS: 1.0000 | ORAL_TABLET | Freq: Two times a day (BID) | ORAL | 0 refills | Status: AC

## 2020-01-09 NOTE — ED Provider Notes (Signed)
[facility]    CSN: [id number] Arrival date & time: 01/09/20  [id number]      History   Chief Complaint Chief Complaint  Patient presents with  . wound check    HPI Jason [name] is a 39 y.o. male.   HPI  Patient has a wound on his right lower leg.  He states it has become swollen and painful.  He has wounds on his skin frequently.  He has habitual picking of the skin with many open areas.  His leg is painful.  Draining.  There is redness and swelling of the skin.  He complains of pain. I discussed with the patient that I am unable to provide him with any narcotic pain medication.  He has a history of chronic narcotic use and failed his most recent pain provider.  He is currently wishing to see a new pain provider.  Most recently was on Suboxone and had a "lost prescription". Patient cannot take any NSAID medications because of his chronic anticoagulation on Xarelto I recommend that he take acetaminophen 1000 mg 3 times daily as needed  He has no fever or chills.  No body aches.  No nausea or vomiting.  Past Medical History:  Diagnosis Date  . Anxiety   . Back injury   . Cervical disc disease   . Chronic back pain   . Chronic low back pain 03/29/2016  . Family history of adverse reaction to anesthesia   . GERD (gastroesophageal reflux disease)   . History of kidney stones    per mother- "there was a diagnoses at some point"  . Insomnia   . Lumbar disc disease   . MIGRAINE, COMMON 09/23/2010   Qualifier: Diagnosis of  By: Jenny Reichmann MD, Hunt Oris   . Pneumonia   . Pulmonary embolism ([place])   . Sickle cell trait [facility])     Patient Active Problem List   Diagnosis Date Noted  . MDD (major depressive disorder), recurrent severe, without psychosis ([place]) 12/10/2019  . Polysubstance dependence including opioid type drug, episodic abuse ([place]) 12/09/2019  . Substance induced mood disorder ([place]) 12/09/2019  . MDD (major depressive disorder), severe ([place]) 12/09/2019  .  Duodenitis   . Community acquired pneumonia 11/10/2019  . Nausea vomiting and diarrhea 11/10/2019  . Diarrhea 11/10/2019  . History of pulmonary embolism 11/10/2019  . AKI (acute kidney injury) ([place]) 11/10/2019  . Sepsis ([place]) 11/09/2019  . 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 Uncomplicated opioid dependence ([place]) 01/08/2019  . Cervicalgia 12/13/2018  . Acute low back pain with bilateral sciatica 12/13/2018  . Chronic pain due to trauma 12/13/2018  . Cervical radiculopathy 12/13/2018  . Cocaine abuse, episodic ([place]) 12/11/2018  . Thrombocytosis ([place]) 12/05/2018  . Lactic acidosis 12/04/2018  . Pulmonary embolism ([place]) 11/27/2018  . S/P lumbar laminectomy 08/29/2017  . Spinal stenosis of lumbar region 08/28/2017    Class: Chronic  . Status post lumbar microdiscectomy 08/28/2017  . 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 Gross hematuria 04/01/2016  . Depression 04/01/2016  . Chronic low back pain 03/29/2016  . Cough 08/05/2015  . Encounter for well adult exam with abnormal findings 02/17/2015  . Lumbar disc disease   . Cervical disc disease   . GERD (gastroesophageal reflux disease)   . Anxiety   . Insomnia   . CONSTIPATION 10/05/2010  . MIGRAINE, COMMON 09/23/2010  . Abdominal pain, other specified site 09/23/2010  Past Surgical History:  Procedure Laterality Date  . BACK SURGERY    . BIOPSY  11/12/2019   Procedure: BIOPSY;  Surgeon: Thornton Park, MD;  Location: [facility];  Service: Gastroenterology;;  . ESOPHAGOGASTRODUODENOSCOPY (EGD) WITH PROPOFOL N/A 11/12/2019   Procedure: ESOPHAGOGASTRODUODENOSCOPY (EGD) WITH PROPOFOL;  Surgeon: Thornton Park, MD;  Location: [facility];  Service: Gastroenterology;  Laterality: N/A;  . LUMBAR LAMINECTOMY/DECOMPRESSION MICRODISCECTOMY Bilateral 08/28/2017   Procedure: BILATERAL PARTIAL HEMILAMINECTOMIES L4-5 WITH MICRODISCECTOMY;  Surgeon: Jessy Oto, MD;  Location: [facility];  Service: Orthopedics;  Laterality: Bilateral;  . NO PAST SURGERIES         Home Medications    Prior to Admission medications   Medication Sig Start Date End Date Taking? Authorizing Provider  gabapentin (NEURONTIN) 300 MG capsule Take 1 capsule (300 mg total) by mouth 3 (three) times daily. 11/14/19   Regalado, Belkys A, MD  rivaroxaban (XARELTO) 20 MG TABS tablet Take 1 tablet (20 mg total) by mouth daily with supper. 04/11/19   Minette Brine, FNP  sulfamethoxazole-trimethoprim (BACTRIM DS) 800-160 MG tablet Take 1 tablet by mouth 2 (two) times daily for 10 days. 01/09/20 01/19/20  Raylene Everts, MD    Family History Family History  Problem Relation Age of Onset  . Congestive Heart Failure Father   . Hypertension Mother   . Hyperlipidemia Mother   . Stroke Mother     Social History Social History   Tobacco Use  . Smoking status: Current Every Day Smoker    Packs/day: 1.00    Years: 24.00    Pack years: 24.00    Types: Cigarettes  . Smokeless tobacco: Never Used  . Tobacco comment: notified primary care provider and CCM RNCM  Substance Use Topics  . Alcohol use: Not Currently    Comment: former use - heavy drinker 8 years ago  . Drug use: No     Allergies   Morphine and related and Other   Review of Systems Review of Systems  Skin: Positive for rash.     Physical Exam Triage Vital Signs ED Triage Vitals  Enc Vitals Group     BP 01/09/20 0852 122/82     Pulse Rate 01/09/20 0852 64     Resp 01/09/20 0852 16     Temp 01/09/20 0852 98.4 F (36.9 C)     Temp Source 01/09/20 0852 Oral     SpO2 01/09/20 0852 99 %     Weight 01/09/20 0859 152 lb (68.9 kg)     Height 01/09/20 0859 5\' 8"  (1.727 m)     Head Circumference --      Peak Flow --      Pain Score 01/09/20 0858 7     Pain Loc --      Pain Edu? --      Excl. in [place]? --    No data found.  Updated Vital Signs BP 122/82 (BP Location: Left Arm)   Pulse 64   Temp  98.4 F (36.9 C) (Oral)   Resp 16   Ht 5\' 8"  (1.727 m)   Wt 68.9 kg   SpO2 99%   BMI 23.11 kg/m       Physical Exam Constitutional:      General: He is not in acute distress.    Appearance: He is well-developed.     Comments: Lean.  Moderately disheveled.  HENT:     Head: Normocephalic and atraumatic.     Mouth/Throat:  Comments: Mask in place Eyes:     Conjunctiva/sclera: Conjunctivae normal.     Pupils: Pupils are equal, round, and reactive to light.  Cardiovascular:     Rate and Rhythm: Normal rate.  Pulmonary:     Effort: Pulmonary effort is normal. No respiratory distress.  Musculoskeletal:        General: Normal range of motion.     Cervical back: Normal range of motion.  Skin:    General: Skin is warm and dry.     Comments: Open wound on back of neck, right dorsum of hand, both lower extremities.  On the right lateral calf there is a 2 cm x 3 cm excoriated area that superficially open.  Yellow crusting.  Surrounded by induration and erythema.  Tender.  2+ edema of the right leg.  1+ edema of the left leg.  Neurological:     General: No focal deficit present.     Mental Status: He is alert.  Psychiatric:        Behavior: Behavior normal.      UC Treatments / Results  Labs (all labs ordered are listed, but only abnormal results are displayed) Labs Reviewed - No data to display  EKG   Radiology No results found.  Procedures Procedures (including critical care time)  Medications Ordered in UC Medications - No data to display  Initial Impression / Assessment and Plan / UC Course  I have reviewed the triage vital signs and the nursing notes.  Pertinent labs & imaging results that were available during my care of the patient were reviewed by me and considered in my medical decision making (see chart for details).     Patient has cellulitis with no abscess identified.  Antibiotics are given.  Pain management as discussed.  See HPI Final Clinical  Impressions(s) / UC Diagnoses   Final diagnoses:  Cellulitis of leg, right     Discharge Instructions     Take the antibiotic 2 x a day Take 2 doses today Elevate leg and reduce walking to reduce the swelling Se your PCP if not improving by next week   ED Prescriptions    Medication Sig Dispense Auth. Provider   sulfamethoxazole-trimethoprim (BACTRIM DS) 800-160 MG tablet Take 1 tablet by mouth 2 (two) times daily for 10 days. 20 tablet Raylene Everts, MD     PDMP not reviewed this encounter.   Raylene Everts, MD 01/09/20 1440

## 2020-01-09 NOTE — Discharge Instructions (Signed)
Take the antibiotic 2 x a day Take 2 doses today Elevate leg and reduce walking to reduce the swelling Se your PCP if not improving by next week

## 2020-01-09 NOTE — ED Notes (Signed)
Dressing placed upon discharge by Kyra Manges RN
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
## 2020-01-09 NOTE — ED Notes (Signed)
Pt presents with wound to legs/neck that he would like assessed. States they have been present X several weeks. Also reports swelling to BLE.

## 2020-04-02 ENCOUNTER — Telehealth: Payer: Self-pay | Admitting: *Deleted

## 2020-04-02 ENCOUNTER — Other Ambulatory Visit: Payer: Self-pay | Admitting: Surgery

## 2020-04-02 DIAGNOSIS — M5441 Lumbago with sciatica, right side: Secondary | ICD-10-CM

## 2020-04-02 DIAGNOSIS — M4722 Other spondylosis with radiculopathy, cervical region: Secondary | ICD-10-CM

## 2020-04-02 NOTE — Telephone Encounter (Signed)
Pt with pt's mom and she stated she is wanting to see if can get pt in for MRI CSP and LSP, he was last seen in January and had [place] insurance and was scehduled in [place] at [facility] and decided did not want to go all way to [place] for an exam. Pt has since changed insurance to [place] and would like to have scan done in [employer]. I informed pt a new order would have to be placed and since it has been since January may need to have an office visit before placing new order but I would go ask Benjiman Core PA-C to see what he wanted to do.   I spoke with Jeneen Rinks and he suggested to go ahead and place order without being seen. Orders has been placed.

## 2020-05-05 ENCOUNTER — Other Ambulatory Visit: Payer: BLUE CROSS/BLUE SHIELD

## 2020-05-09 ENCOUNTER — Other Ambulatory Visit: Payer: Self-pay

## 2020-05-09 ENCOUNTER — Ambulatory Visit
Admission: RE | Admit: 2020-05-09 | Discharge: 2020-05-09 | Disposition: A | Discharge status: Home / Self Care | Payer: 59 | Source: Ambulatory Visit | Attending: Surgery | Admitting: Surgery

## 2020-05-09 ENCOUNTER — Other Ambulatory Visit: Payer: 59

## 2020-05-09 DIAGNOSIS — M5441 Lumbago with sciatica, right side: Secondary | ICD-10-CM

## 2020-05-09 DIAGNOSIS — M4722 Other spondylosis with radiculopathy, cervical region: Secondary | ICD-10-CM

## 2020-05-09 DIAGNOSIS — M5442 Lumbago with sciatica, left side: Secondary | ICD-10-CM

## 2020-05-11 ENCOUNTER — Other Ambulatory Visit: Payer: Self-pay

## 2020-05-11 ENCOUNTER — Ambulatory Visit (INDEPENDENT_AMBULATORY_CARE_PROVIDER_SITE_OTHER): Payer: 59 | Admitting: Specialist

## 2020-05-11 ENCOUNTER — Encounter: Payer: Self-pay | Admitting: Specialist

## 2020-05-11 VITALS — BP 122/87 | HR 74 | Ht 68.0 in | Wt 152.0 lb

## 2020-05-11 DIAGNOSIS — M5010 Cervical disc disorder with radiculopathy, unspecified cervical region: Secondary | ICD-10-CM

## 2020-05-11 DIAGNOSIS — M47816 Spondylosis without myelopathy or radiculopathy, lumbar region: Secondary | ICD-10-CM

## 2020-05-11 DIAGNOSIS — M4722 Other spondylosis with radiculopathy, cervical region: Secondary | ICD-10-CM

## 2020-05-11 DIAGNOSIS — M5136 Other intervertebral disc degeneration, lumbar region: Secondary | ICD-10-CM

## 2020-05-11 MED ORDER — HYDROCODONE-ACETAMINOPHEN 5-325 MG PO TABS: 1.0000 | ORAL_TABLET | Freq: Four times a day (QID) | ORAL | 0 refills | Status: DC | PRN

## 2020-05-11 MED ORDER — GABAPENTIN 300 MG PO CAPS: 300.0000 mg | ORAL_CAPSULE | Freq: Three times a day (TID) | ORAL | 0 refills | Status: DC

## 2020-05-11 NOTE — Progress Notes (Signed)
Office Visit Note   Patient: Jason [name]           Date of Birth: [birth year]           MRN: [id number] Visit Date: 05/11/2020              Requested by: Shelby Mattocks, PA-C [address],  [place] [address] PCP: Shelby Mattocks, PA-C   Assessment & Plan: Visit Diagnoses:  1. Degenerative disc disease, lumbar   2. Other spondylosis with radiculopathy, cervical region   3. Cervical disc disorder with radiculopathy, unspecified cervical region   4. Spondylosis without myelopathy or radiculopathy, lumbar region     Plan: Avoid overhead lifting and overhead use of the arms. Do not lift greater than 5 lbs. Adjust head rest in vehicle to prevent hyperextension if rear ended. Take extra precautions to avoid falling, including use of a cane if you feel weak. Scheduling secretary Kandice Hams will call you to arrange for surgery for your cervical spine. If you wish a second opinion please let us know and we can arrange for you. If you have worsening arm or leg numbness or weakness please call or go to an ER. We will contact your primary care physician Dr. Glendale Chard to seek clearance for your surgery. Surgery will be a left cervical foramenotomy at the C6-7 level with use of microscope  Risks of surgery include risks of infection, bleeding and risks to the spinal cord and  Risks of sore throat and difficulty swallowing which should  Improve over the next 4-6 weeks following surgery. Surgery is indicated due to upper extremity radiculopathy. In the future surgery at adjacent levels may be necessary but these levels do not appear to be related to your current symptoms or signs.   Follow-Up Instructions: Return in about 4 weeks (around 06/08/2020).   Orders:  No orders of the defined types were placed in this encounter.  Meds ordered this encounter  Medications  . HYDROcodone-acetaminophen (NORCO/VICODIN) 5-325 MG tablet    Sig:  Take 1 tablet by mouth every 6 (six) hours as needed for moderate pain.    Dispense:  30 tablet    Refill:  0  . gabapentin (NEURONTIN) 300 MG capsule    Sig: Take 1 capsule (300 mg total) by mouth 3 (three) times daily.    Dispense:  90 capsule    Refill:  0      Procedures: No procedures performed   Clinical Data: No additional findings.   Subjective: Chief Complaint  Patient presents with  . Neck - Pain, Follow-up  . Lower Back - Pain, Follow-up    39 year old male right handed with several year history of neck and left arm pain. He has weakness in the left arm and pain with turning neck right or left. No bowel or bladder difficulty. Pain is into the all the finger tips left hand. He is walking and able to stand without difficulty. Does have pain in the lumbar spine and history of previous L4-5 bilateral hemilaminectomies. He is having low back pain with bilaterla posterior thigh and leg pain. Pain with sitting and with staying in one position the pain in the lower back worsens. In the interval he was placed on xarelto for thromboembolic disease. He  Stopped the meds due to loss of funding insurance but now is back on insurance and it can be restarted.    Review of Systems  Constitutional: Negative.   HENT: Negative.  Eyes: Negative.   Respiratory: Positive for shortness of breath.   Cardiovascular: Negative for chest pain, palpitations and leg swelling.  Gastrointestinal: Negative.   Endocrine: Negative.   Genitourinary: Negative.   Musculoskeletal: Negative.   Skin: Negative.   Allergic/Immunologic: Negative.   Neurological: Negative.   Hematological: Negative.   Psychiatric/Behavioral: Negative.      Objective: Vital Signs: BP (!) 122/87   Pulse 74   Ht 5\' 8"  (1.727 m)   Wt 152 lb (68.9 kg)   BMI 23.11 kg/m   Physical Exam  Ortho Exam  Specialty Comments:  Patient needs to verify address.  Imaging: No results found.   PMFS History: Patient  Active Problem List   Diagnosis Date Noted  . Spinal stenosis of lumbar region 08/28/2017    Priority: High    Class: Chronic  . 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 MDD (major depressive disorder), recurrent severe, without psychosis ([place]) 12/10/2019  . Polysubstance dependence including opioid type drug, episodic abuse ([place]) 12/09/2019  . Substance induced mood disorder ([place]) 12/09/2019  . MDD (major depressive disorder), severe ([place]) 12/09/2019  . Duodenitis   . Community acquired pneumonia 11/10/2019  . Nausea vomiting and diarrhea 11/10/2019  . Diarrhea 11/10/2019  . History of pulmonary embolism 11/10/2019  . AKI (acute kidney injury) ([place]) 11/10/2019  . Sepsis ([place]) 11/09/2019  . 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 Uncomplicated opioid dependence ([place]) 01/08/2019  . Cervicalgia 12/13/2018  . Acute low back pain with bilateral sciatica 12/13/2018  . Chronic pain due to trauma 12/13/2018  . Cervical radiculopathy 12/13/2018  . Cocaine abuse, episodic ([place]) 12/11/2018  . Thrombocytosis ([place]) 12/05/2018  . Lactic acidosis 12/04/2018  . Pulmonary embolism ([place]) 11/27/2018  . S/P lumbar laminectomy 08/29/2017  . Status post lumbar microdiscectomy 08/28/2017  . Gross hematuria 04/01/2016  . Depression 04/01/2016  . Chronic low back pain 03/29/2016  . Cough 08/05/2015  . Encounter for well adult exam with abnormal findings 02/17/2015  . Lumbar disc disease   . Cervical disc disease   . GERD (gastroesophageal reflux disease)   . Anxiety   . Insomnia   . CONSTIPATION 10/05/2010  . MIGRAINE, COMMON 09/23/2010  . Abdominal pain, other specified site 09/23/2010   Past Medical History:  Diagnosis Date  . Anxiety   . Back injury   . Cervical disc disease   . Chronic back pain   . Chronic low back pain 03/29/2016  . Family history of adverse reaction to anesthesia   . GERD  (gastroesophageal reflux disease)   . History of kidney stones    per mother- "there was a diagnoses at some point"  . Insomnia   . Lumbar disc disease   . MIGRAINE, COMMON 09/23/2010   Qualifier: Diagnosis of  By: Jenny Reichmann MD, Hunt Oris   . Pneumonia   . Pulmonary embolism ([place])   . Sickle cell trait (HCC)     Family History  Problem Relation Age of Onset  . Congestive Heart Failure Father   . Hypertension Mother   . Hyperlipidemia Mother   . Stroke Mother     Past Surgical History:  Procedure Laterality Date  . BACK SURGERY    . BIOPSY  11/12/2019   Procedure: BIOPSY;  Surgeon: Thornton Park, MD;  Location: [place] [place];  Service: Gastroenterology;;  . ESOPHAGOGASTRODUODENOSCOPY (EGD) WITH PROPOFOL N/A 11/12/2019   Procedure: ESOPHAGOGASTRODUODENOSCOPY (EGD) WITH  PROPOFOL;  Surgeon: Thornton Park, MD;  Location: [facility];  Service: Gastroenterology;  Laterality: N/A;  . LUMBAR LAMINECTOMY/DECOMPRESSION MICRODISCECTOMY Bilateral 08/28/2017   Procedure: BILATERAL PARTIAL HEMILAMINECTOMIES L4-5 WITH MICRODISCECTOMY;  Surgeon: Jessy Oto, MD;  Location: [place];  Service: Orthopedics;  Laterality: Bilateral;  . NO PAST SURGERIES     Social History   Occupational History  . Not on file  Tobacco Use  . Smoking status: Current Every Day Smoker    Packs/day: 1.00    Years: 24.00    Pack years: 24.00    Types: Cigarettes  . Smokeless tobacco: Never Used  . Tobacco comment: notified primary care provider and CCM RNCM  Vaping Use  . Vaping Use: Some days  Substance and Sexual Activity  . Alcohol use: Not Currently    Comment: former use - heavy drinker 8 years ago  . Drug use: No  . Sexual activity: Yes

## 2020-05-11 NOTE — Patient Instructions (Signed)
Avoid overhead lifting and overhead use of the arms. Do not lift greater than 5 lbs. Adjust head rest in vehicle to prevent hyperextension if rear ended. Take extra precautions to avoid falling, including use of a cane if you feel weak. Scheduling secretary Kandice Hams will call you to arrange for surgery for your cervical spine. If you wish a second opinion please let us know and we can arrange for you. If you have worsening arm or leg numbness or weakness please call or go to an ER. We will contact your primary care physician Dr. Glendale Chard to seek clearance for your surgery. Surgery will be a left cervical foramenotomy at the C6-7 level with use of microscope  Risks of surgery include risks of infection, bleeding and risks to the spinal cord and  Risks of sore throat and difficulty swallowing which should  Improve over the next 4-6 weeks following surgery. Surgery is indicated due to upper extremity radiculopathy. In the future surgery at adjacent levels may be necessary but these levels do not appear to be related to your current symptoms or signs.

## 2020-05-14 ENCOUNTER — Telehealth: Payer: Self-pay | Admitting: Specialist

## 2020-05-14 NOTE — Telephone Encounter (Signed)
I am not seeing what this is for in the chart

## 2020-05-14 NOTE — Telephone Encounter (Signed)
Pt called stating he was supposed to call back with the Claim address  [id number]  Zip [phone]

## 2020-05-27 ENCOUNTER — Telehealth: Payer: Self-pay

## 2020-05-27 NOTE — Telephone Encounter (Signed)
I called patient and left him a v/m to call the office so we can schedule him an appointment for surgical clearance. YL,RMA

## 2020-05-29 ENCOUNTER — Telehealth: Payer: Self-pay | Admitting: Specialist

## 2020-05-29 NOTE — Telephone Encounter (Signed)
Made in error

## 2020-06-04 ENCOUNTER — Telehealth: Payer: Self-pay

## 2020-06-04 NOTE — Telephone Encounter (Signed)
I returned the pt's call and left a message for him to call the office back for an appt for surgical clearance.

## 2020-06-10 ENCOUNTER — Ambulatory Visit (INDEPENDENT_AMBULATORY_CARE_PROVIDER_SITE_OTHER): Payer: Managed Care, Other (non HMO) | Admitting: Nurse Practitioner

## 2020-06-10 ENCOUNTER — Other Ambulatory Visit: Payer: Self-pay

## 2020-06-10 ENCOUNTER — Encounter: Payer: Self-pay | Admitting: Nurse Practitioner

## 2020-06-10 VITALS — BP 118/72 | HR 62 | Temp 97.9°F | Wt 168.0 lb

## 2020-06-10 DIAGNOSIS — Z716 Tobacco abuse counseling: Secondary | ICD-10-CM

## 2020-06-10 DIAGNOSIS — M542 Cervicalgia: Secondary | ICD-10-CM | POA: Diagnosis not present

## 2020-06-10 DIAGNOSIS — Z01818 Encounter for other preprocedural examination: Secondary | ICD-10-CM | POA: Diagnosis not present

## 2020-06-10 DIAGNOSIS — M5412 Radiculopathy, cervical region: Secondary | ICD-10-CM

## 2020-06-10 DIAGNOSIS — Z720 Tobacco use: Secondary | ICD-10-CM

## 2020-06-10 DIAGNOSIS — R7309 Other abnormal glucose: Secondary | ICD-10-CM

## 2020-06-10 DIAGNOSIS — R001 Bradycardia, unspecified: Secondary | ICD-10-CM

## 2020-06-10 NOTE — Progress Notes (Signed)
I,Yamilka Roman [employer] as a Education administrator for [employer], FNP.,have documented all relevant documentation on the behalf of Minette Brine, FNP,as directed by  Minette Brine, FNP while in the presence of Minette Brine, [place]. This visit occurred during the SARS-CoV-2 public health emergency.  Safety protocols were in place, including screening questions prior to the visit, additional usage of staff PPE, and extensive cleaning of exam room while observing appropriate contact time as indicated for disinfecting solutions.  Subjective:     Patient ID: Jason [name] , male    DOB: [birth year] , 39 y.o.   MRN: [id number]   Chief Complaint  Patient presents with  . SURGICAL CLEARANCE    HPI  He is here today for surgical clearance for neck pain.  He has neck spurs and stenosis. He also is due to have back surgery.   Dr [name] is his surgeon.      Past Medical History:  Diagnosis Date  . Anxiety   . Back injury   . Cervical disc disease   . Chronic back pain   . Chronic low back pain 03/29/2016  . Family history of adverse reaction to anesthesia   . GERD (gastroesophageal reflux disease)   . History of kidney stones    per mother- "there was a diagnoses at some point"  . Insomnia   . Lumbar disc disease   . MIGRAINE, COMMON 09/23/2010   Qualifier: Diagnosis of  By: Jenny Reichmann MD, Hunt Oris   . Pneumonia   . Pulmonary embolism ([place])   . Sickle cell trait (HCC)      Family History  Problem Relation Age of Onset  . Congestive Heart Failure Father   . Hypertension Mother   . Hyperlipidemia Mother   . Stroke Mother     No current outpatient medications on file.   Allergies  Allergen Reactions  . Morphine And Related Swelling  . Other Hives and Swelling    Antibiotic that was given when he had pneumonia      Review of Systems  Constitutional: Negative.   Respiratory: Negative.   Cardiovascular: Negative.   Gastrointestinal: Negative.   Psychiatric/Behavioral: Negative.       Today's Vitals   06/10/20 1600  BP: 118/72  Pulse: 62  Temp: 97.9 F (36.6 C)  TempSrc: Oral  Weight: 168 lb (76.2 kg)  PainSc: 6   PainLoc: Back   Body mass index is 25.54 kg/m.   Objective:  Physical Exam Vitals reviewed.  Constitutional:      General: He is not in acute distress.    Appearance: Normal appearance.  Cardiovascular:     Rate and Rhythm: Normal rate and regular rhythm.     Pulses: Normal pulses.     Heart sounds: Normal heart sounds. No murmur heard.   Pulmonary:     Effort: Pulmonary effort is normal. No respiratory distress.     Breath sounds: Normal breath sounds.  Neurological:     General: No focal deficit present.     Mental Status: He is alert and oriented to person, place, and time.  Psychiatric:        Mood and Affect: Mood normal.        Behavior: Behavior normal.        Thought Content: Thought content normal.        Judgment: Judgment normal.         Assessment And Plan:     1. Cervical radiculopathy  Being managed by neurosurgery awaiting surgery -  CBC - EKG 12-Lead  2. Cervicalgia  Chronic,  Related to a previous injury - CBC - EKG 12-Lead  3. Abnormal glucose  Chronic, will check levels  No current medications  Pending labs will consider medications - Hemoglobin A1c - CMP14+EGFR - CBC - Lipid panel  4. Pre-operative clearance  Pending lab results will clear for surgery medically  Referring to cardiology due to bradycardia, denies previously seeing a cardiologist - EKG 12-Lead  5. Tobacco abuse counseling  Smoking cessation instruction/counseling given:  counseled patient on the dangers of tobacco use, advised patient to stop smoking, and reviewed strategies to maximize success  6. Tobacco abuse   7. Bradycardia  Heart rate 54 on EKG with sinus bradycardia  I will refer him to cardiology to be cleared for his neck surgery   Patient was given opportunity to ask questions. Patient verbalized  understanding of the plan and was able to repeat key elements of the plan. All questions were answered to their satisfaction.  Minette Brine, FNP   I, Minette Brine, FNP, have reviewed all documentation for this visit. The documentation on 06/10/20 for the exam, diagnosis, procedures, and orders are all accurate and complete.  THE PATIENT IS ENCOURAGED TO PRACTICE SOCIAL DISTANCING DUE TO THE COVID-19 PANDEMIC.

## 2020-06-11 ENCOUNTER — Encounter: Payer: Self-pay | Admitting: Nurse Practitioner

## 2020-06-11 LAB — CMP14+EGFR
ALT: 15 IU/L (ref 0–44)
AST: 15 IU/L (ref 0–40)
Albumin/Globulin Ratio: 2.1 (ref 1.2–2.2)
Albumin: 4.5 g/dL (ref 4.0–5.0)
Alkaline Phosphatase: 60 IU/L (ref 48–121)
BUN/Creatinine Ratio: 8 — ABNORMAL LOW (ref 9–20)
BUN: 6 mg/dL (ref 6–20)
Bilirubin Total: 0.3 mg/dL (ref 0.0–1.2)
CO2: 27 mmol/L (ref 20–29)
Calcium: 9.4 mg/dL (ref 8.7–10.2)
Chloride: 99 mmol/L (ref 96–106)
Creatinine, Ser: 0.72 mg/dL — ABNORMAL LOW (ref 0.76–1.27)
GFR calc Af Amer: 137 mL/min/{1.73_m2} (ref >59)
GFR calc non Af Amer: 118 mL/min/{1.73_m2} (ref >59)
Globulin, Total: 2.1 g/dL (ref 1.5–4.5)
Glucose: 116 mg/dL — ABNORMAL HIGH (ref 65–99)
Potassium: 4.8 mmol/L (ref 3.5–5.2)
Sodium: 137 mmol/L (ref 134–144)
Total Protein: 6.6 g/dL (ref 6.0–8.5)

## 2020-06-11 LAB — CBC
Hematocrit: 39.9 % (ref 37.5–51.0)
Hemoglobin: 13 g/dL (ref 13.0–17.7)
MCH: 26.6 pg (ref 26.6–33.0)
MCHC: 32.6 g/dL (ref 31.5–35.7)
MCV: 82 fL (ref 79–97)
Platelets: 283 10*3/uL (ref 150–450)
RBC: 4.89 x10E6/uL (ref 4.14–5.80)
RDW: 13.7 % (ref 11.6–15.4)
WBC: 6.2 10*3/uL (ref 3.4–10.8)

## 2020-06-11 LAB — LIPID PANEL
Chol/HDL Ratio: 2.8 ratio (ref 0.0–5.0)
Cholesterol, Total: 152 mg/dL (ref 100–199)
HDL: 54 mg/dL (ref >39)
LDL Chol Calc (NIH): 71 mg/dL (ref 0–99)
Triglycerides: 159 mg/dL — ABNORMAL HIGH (ref 0–149)
VLDL Cholesterol Cal: 27 mg/dL (ref 5–40)

## 2020-06-11 LAB — HEMOGLOBIN A1C
Est. average glucose Bld gHb Est-mCnc: 128 mg/dL
Hgb A1c MFr Bld: 6.1 % — ABNORMAL HIGH (ref 4.8–5.6)

## 2020-06-17 ENCOUNTER — Ambulatory Visit: Payer: Self-pay | Admitting: Interventional Cardiology

## 2020-06-17 ENCOUNTER — Other Ambulatory Visit: Payer: Self-pay

## 2020-07-14 ENCOUNTER — Ambulatory Visit (INDEPENDENT_AMBULATORY_CARE_PROVIDER_SITE_OTHER): Payer: Managed Care, Other (non HMO) | Admitting: Internal Medicine

## 2020-07-14 ENCOUNTER — Other Ambulatory Visit: Payer: Self-pay

## 2020-07-14 ENCOUNTER — Encounter: Payer: Self-pay | Admitting: Internal Medicine

## 2020-07-14 VITALS — BP 134/88 | HR 88 | Ht 68.0 in | Wt 160.4 lb

## 2020-07-14 DIAGNOSIS — Z86711 Personal history of pulmonary embolism: Secondary | ICD-10-CM | POA: Diagnosis not present

## 2020-07-14 DIAGNOSIS — R001 Bradycardia, unspecified: Secondary | ICD-10-CM | POA: Diagnosis not present

## 2020-07-14 DIAGNOSIS — Z01810 Encounter for preprocedural cardiovascular examination: Secondary | ICD-10-CM

## 2020-07-14 NOTE — Progress Notes (Signed)
Cardiology Office Note:    Date:  07/14/2020   ID:  Jason [name], DOB [birth year], MRN [id number]  PCP:  Glendale Chard, MD   Referring MD: Minette Brine, FNP   CC: Pre-op Consulted for the evaluation of Preoperative Risk Assessment at the behest of Glendale Chard, MD  History of Present Illness:    Jason [name] is a 39 y.o. male with a hx of PE(11/2018), Bradycardia NOS who presents for preoperative assessment.  Patient is getting left cervical foraminotomy at C6-C7 from Dr. [name] (othropedics)  Patient notes that outside of back pain, arm pain, and neck pain (seen by othro) patient notes that he is doing well.  Patient notes limited activity because he doesn't like being around too many people.  No chest pain, chest pressure, chest tightness, chest stabbing.  No SOB/DOE.  Occasionally has burning chest pain that resolved after 1-2 minutes.  Pain is 0.5/10.  No change with exercise or rest. No syncope.    Past Medical History:  Diagnosis Date  . Anxiety   . Back injury   . Cervical disc disease   . Chronic back pain   . Chronic low back pain 03/29/2016  . Family history of adverse reaction to anesthesia   . GERD (gastroesophageal reflux disease)   . History of kidney stones    per mother- "there was a diagnoses at some point"  . Insomnia   . Lumbar disc disease   . MIGRAINE, COMMON 09/23/2010   Qualifier: Diagnosis of  By: Jenny Reichmann MD, Hunt Oris   . Pneumonia   . Pulmonary embolism ([place])   . Sickle cell trait [facility])     Past Surgical History:  Procedure Laterality Date  . BACK SURGERY    . BIOPSY  11/12/2019   Procedure: BIOPSY;  Surgeon: Thornton Park, MD;  Location: [place];  Service: Gastroenterology;;  . ESOPHAGOGASTRODUODENOSCOPY (EGD) WITH PROPOFOL N/A 11/12/2019   Procedure: ESOPHAGOGASTRODUODENOSCOPY (EGD) WITH PROPOFOL;  Surgeon: Thornton Park, MD;  Location: [place];  Service: Gastroenterology;  Laterality: N/A;  . LUMBAR  LAMINECTOMY/DECOMPRESSION MICRODISCECTOMY Bilateral 08/28/2017   Procedure: BILATERAL PARTIAL HEMILAMINECTOMIES L4-5 WITH MICRODISCECTOMY;  Surgeon: Jessy Oto, MD;  Location: [facility];  Service: Orthopedics;  Laterality: Bilateral;  . NO PAST SURGERIES     Current Medications: No outpatient medications have been marked as taking for the 07/14/20 encounter (Office Visit) with Werner Lean, MD.    Allergies:   Morphine and related and Other   Social History   Socioeconomic History  . Marital status: Single    Spouse name: Not on file  . Number of children: 1  . Years of education: Not on file  . Highest education level: GED or equivalent  Occupational History  . Not on file  Tobacco Use  . Smoking status: Current Every Day Smoker    Packs/day: 1.00    Years: 24.00    Pack years: 24.00    Types: Cigarettes  . Smokeless tobacco: Never Used  . Tobacco comment: notified primary care provider and CCM RNCM  Vaping Use  . Vaping Use: Some days  Substance and Sexual Activity  . Alcohol use: Not Currently    Comment: former use - heavy drinker 8 years ago  . Drug use: No  . Sexual activity: Yes  Other Topics Concern  . Not on file  Social History Narrative  . Not on file   Social Determinants of Health   Financial Resource Strain:   . Difficulty of Paying  Living Expenses: Not on file  Food Insecurity:   . Worried About Charity fundraiser in the Last Year: Not on file  . Ran Out of Food in the Last Year: Not on file  Transportation Needs:   . Lack of Transportation (Medical): Not on file  . Lack of Transportation (Non-Medical): Not on file  Physical Activity:   . Days of Exercise per Week: Not on file  . Minutes of Exercise per Session: Not on file  Stress:   . Feeling of Stress : Not on file  Social Connections:   . Frequency of Communication with Friends and Family: Not on file  . Frequency of Social Gatherings with Friends and Family: Not on file  .  Attends Religious Services: Not on file  . Active Member of Clubs or Organizations: Not on file  . Attends Archivist Meetings: Not on file  . Marital Status: Not on file    Family History: [name] family history includes Congestive Heart Failure in his father; Hyperlipidemia in his mother; Hypertension in his mother; Stroke in his mother.  ROS:   Please see the history of present illness.    Notes neck pain  All other systems reviewed and are negative.  EKGs/Labs/Other Studies Reviewed:    The following studies were reviewed today:  EKG:  SR rate 70 does not meet LVH criteria EKG 06/11/20: Sinus 54 borderline LVH with Repol consider normal variant in AA Male  Echo 11/12/19 Normal Biventricular function; no valvular pathology  Recent Labs: 11/12/2019: TSH 2.664 11/14/2019: Magnesium 1.9 06/10/2020: ALT 15; BUN 6; Creatinine, Ser 0.72; Hemoglobin 13.0; Platelets 283; Potassium 4.8; Sodium 137  Recent Lipid Panel    Component Value Date/Time   CHOL 152 06/10/2020 1654   TRIG 159 (H) 06/10/2020 1654   HDL 54 06/10/2020 1654   CHOLHDL 2.8 06/10/2020 1654   CHOLHDL 3 03/23/2016 1215   VLDL 25.2 03/23/2016 1215   LDLCALC 71 06/10/2020 1654   A1c 6.1  Physical Exam:    VS:  BP 134/88   Pulse 88   Ht 5\' 8"  (1.727 m)   Wt 160 lb 6.4 oz (72.8 kg)   SpO2 98%   BMI 24.39 kg/m     Wt Readings from Last 3 Encounters:  07/14/20 160 lb 6.4 oz (72.8 kg)  06/10/20 168 lb (76.2 kg)  05/11/20 152 lb (68.9 kg)     GEN: Well nourished, well developed in no acute distress HEENT: Normal NECK: No JVD; No carotid bruits LYMPHATICS: No lymphadenopathy CARDIAC: RRR, no murmurs, rubs, gallops RESPIRATORY:  Clear to auscultation without rales, wheezing or rhonchi  ABDOMEN: Soft, non-tender, non-distended MUSCULOSKELETAL:  No edema; No deformity  SKIN: Warm and dry NEUROLOGIC:  Alert and oriented x 3 PSYCHIATRIC:  Normal affect   ASSESSMENT:    1. Preoperative  cardiovascular examination   2. History of pulmonary embolism   3. Sinus bradycardia    PLAN:    In order of problems listed above:  Preoperative Risk Assessment with prior sinus bradycardia, pulmonary embolism and non-cardiac chest pain  - The Revised Cardiac Risk Index = 0 , which equates to 0.4% estimated risk of perioperative myocardial infarction, pulmonary edema, ventricular fibrillation, cardiac arrest, or complete heart block.  -DASI 46.7- 7.25 fMETS - No further cardiac testing is recommended prior to surgery.  - The patient may proceed to surgery at acceptable risk.   - given unclear hx of PE (Provoked or unprovoked) would be at higher  risk of post operative PE - Our service is available as needed in the peri-operative period.    PRN Follow up  Medication Adjustments/Labs and Tests Ordered: Current medicines are reviewed at length with the patient today.  Concerns regarding medicines are outlined above.  No orders of the defined types were placed in this encounter.  No orders of the defined types were placed in this encounter.   There are no Patient Instructions on file for this visit.   Signed, Werner Lean, MD  07/14/2020 10:14 AM    [place]

## 2020-07-14 NOTE — Patient Instructions (Signed)
Medication Instructions:  Your physician recommends that you continue on your current medications as directed. Please refer to the Current Medication list given to you today.  *If you need a refill on your cardiac medications before your next appointment, please call your pharmacy*   Lab Work: None ordered  If you have labs (blood work) drawn today and your tests are completely normal, you will receive your results only by:  [place] (if you have MyChart) OR  A paper copy in the mail If you have any lab test that is abnormal or we need to change your treatment, we will call you to review the results.   Testing/Procedures: None orderd   Follow-Up: At [facility], you and your health needs are our priority.  As part of our continuing mission to provide you with exceptional heart care, we have created designated Provider Care Teams.  These Care Teams include your primary Cardiologist (physician) and Advanced Practice Providers (APPs -  Physician Assistants and Nurse Practitioners) who all work together to provide you with the care you need, when you need it.  We recommend signing up for the patient portal called "MyChart".  Sign up information is provided on this After Visit Summary.  MyChart is used to connect with patients for Virtual Visits (Telemedicine).  Patients are able to view lab/test results, encounter notes, upcoming appointments, etc.  Non-urgent messages can be sent to your provider as well.   To learn more about what you can do with MyChart, go to [email or web address].    Your next appointment:   As needed   The format for your next appointment:   In Person  Provider:   You may see Werner Lean, MD or one of the following Advanced Practice Providers on your designated Care Team:    Melina Copa, PA-C  Ermalinda Barrios, PA-C    Other Instructions

## 2020-07-27 ENCOUNTER — Other Ambulatory Visit: Payer: Self-pay

## 2020-07-29 ENCOUNTER — Encounter: Payer: Self-pay | Admitting: Surgery

## 2020-07-29 ENCOUNTER — Other Ambulatory Visit: Payer: Self-pay

## 2020-07-29 ENCOUNTER — Ambulatory Visit (INDEPENDENT_AMBULATORY_CARE_PROVIDER_SITE_OTHER): Payer: Managed Care, Other (non HMO) | Admitting: Surgery

## 2020-07-29 VITALS — BP 118/81 | HR 74 | Temp 98.1°F

## 2020-07-29 DIAGNOSIS — M5010 Cervical disc disorder with radiculopathy, unspecified cervical region: Secondary | ICD-10-CM

## 2020-07-29 NOTE — Progress Notes (Signed)
  39 year old black male history of left C6-7 HNP neck pain and left upper extremity radiculopathy comes in for preop evaluation.  States that symptoms unchanged from previous visit.  He is want to proceed with left C6-7 foraminotomies scheduled.  Today history and physical performed.  Review of systems negative.  We received preop cardiac clearance below.   Preoperative Risk Assessment with prior sinus bradycardia, pulmonary embolism and non-cardiac chest pain  - The Revised Cardiac Risk Index = 0 , which equates to 0.4% estimated risk of perioperative myocardial infarction, pulmonary edema, ventricular fibrillation, cardiac arrest, or complete heart block.  -DASI 46.7- 7.25 fMETS - No further cardiac testing is recommended prior to surgery.  - The patient may proceed to surgery at acceptable risk.   - given unclear hx of PE (Provoked or unprovoked) would be at higher risk of post operative PE - Our service is available as needed in the peri-operative period.    PRN Follow up    Surgical procedure discussed.  Patient does have a history of substance abuse and I did add on a urine drug screen to his preop labs.  Patient denies any recent or current illicit drug use.  All questions answered.

## 2020-08-04 ENCOUNTER — Encounter: Payer: Self-pay | Admitting: Specialist

## 2020-08-06 ENCOUNTER — Other Ambulatory Visit: Payer: Self-pay

## 2020-08-06 ENCOUNTER — Ambulatory Visit (HOSPITAL_COMMUNITY): Admission: RE | Admit: 2020-08-06 | Payer: Managed Care, Other (non HMO) | Source: Ambulatory Visit

## 2020-08-06 ENCOUNTER — Encounter (HOSPITAL_COMMUNITY): Payer: Self-pay

## 2020-08-06 ENCOUNTER — Encounter (HOSPITAL_COMMUNITY)
Admission: RE | Admit: 2020-08-06 | Discharge: 2020-08-06 | Disposition: A | Discharge status: Home / Self Care | Payer: Managed Care, Other (non HMO) | Source: Ambulatory Visit | Attending: Specialist | Admitting: Specialist

## 2020-08-06 ENCOUNTER — Other Ambulatory Visit (HOSPITAL_COMMUNITY): Payer: Managed Care, Other (non HMO)

## 2020-08-06 DIAGNOSIS — R079 Chest pain, unspecified: Secondary | ICD-10-CM | POA: Diagnosis not present

## 2020-08-06 DIAGNOSIS — Z8739 Personal history of other diseases of the musculoskeletal system and connective tissue: Secondary | ICD-10-CM | POA: Insufficient documentation

## 2020-08-06 DIAGNOSIS — M4722 Other spondylosis with radiculopathy, cervical region: Secondary | ICD-10-CM | POA: Insufficient documentation

## 2020-08-06 DIAGNOSIS — G8929 Other chronic pain: Secondary | ICD-10-CM | POA: Diagnosis not present

## 2020-08-06 DIAGNOSIS — Z86711 Personal history of pulmonary embolism: Secondary | ICD-10-CM | POA: Diagnosis not present

## 2020-08-06 DIAGNOSIS — Z01818 Encounter for other preprocedural examination: Secondary | ICD-10-CM

## 2020-08-06 DIAGNOSIS — Z7901 Long term (current) use of anticoagulants: Secondary | ICD-10-CM | POA: Insufficient documentation

## 2020-08-06 LAB — URINALYSIS, ROUTINE W REFLEX MICROSCOPIC
Bilirubin Urine: NEGATIVE
Glucose, UA: NEGATIVE mg/dL
Ketones, ur: NEGATIVE mg/dL
Leukocytes,Ua: NEGATIVE
Nitrite: NEGATIVE
Protein, ur: 30 mg/dL — AB
Specific Gravity, Urine: 1.026 (ref 1.005–1.030)
pH: 5 (ref 5.0–8.0)

## 2020-08-06 LAB — SURGICAL PCR SCREEN
MRSA, PCR: NEGATIVE
Staphylococcus aureus: POSITIVE — AB

## 2020-08-06 LAB — COMPREHENSIVE METABOLIC PANEL
ALT: 14 U/L (ref 0–44)
AST: 17 U/L (ref 15–41)
Albumin: 4.4 g/dL (ref 3.5–5.0)
Alkaline Phosphatase: 54 U/L (ref 38–126)
Anion gap: 11 (ref 5–15)
BUN: 7 mg/dL (ref 6–20)
CO2: 26 mmol/L (ref 22–32)
Calcium: 9.7 mg/dL (ref 8.9–10.3)
Chloride: 103 mmol/L (ref 98–111)
Creatinine, Ser: 1.29 mg/dL — ABNORMAL HIGH (ref 0.61–1.24)
GFR, Estimated: >60 mL/min (ref >60)
Glucose, Bld: 112 mg/dL — ABNORMAL HIGH (ref 70–99)
Potassium: 3.8 mmol/L (ref 3.5–5.1)
Sodium: 140 mmol/L (ref 135–145)
Total Bilirubin: 1.4 mg/dL — ABNORMAL HIGH (ref 0.3–1.2)
Total Protein: 7.8 g/dL (ref 6.5–8.1)

## 2020-08-06 LAB — CBC
HCT: 47.1 % (ref 39.0–52.0)
Hemoglobin: 15.5 g/dL (ref 13.0–17.0)
MCH: 26.5 pg (ref 26.0–34.0)
MCHC: 32.9 g/dL (ref 30.0–36.0)
MCV: 80.5 fL (ref 80.0–100.0)
Platelets: 382 10*3/uL (ref 150–400)
RBC: 5.85 MIL/uL — ABNORMAL HIGH (ref 4.22–5.81)
RDW: 14 % (ref 11.5–15.5)
WBC: 9.4 10*3/uL (ref 4.0–10.5)
nRBC: 0 % (ref 0.0–0.2)

## 2020-08-06 NOTE — Pre-Procedure Instructions (Addendum)
Jason [name]  08/06/2020      [facility] [place], [place] [address] Phone: [phone] Fax: [phone]    Your procedure is scheduled on Oct.25  Report to [facility] entrance A at 5:30 A.M.  Call this number if you have problems the morning of surgery:  [phone]   Remember:  Do not eat  after midnight.  You may drink clear liquids until 4:30 A.M..  Clear liquids allowed are:                    Water, Juice (non-citric and without pulp - diabetics please choose diet or no sugar options), Carbonated beverages - (diabetics please choose diet or no sugar options), Clear Tea, Black Coffee only (no creamer, milk or cream including half and half), Plain Jell-O only (diabetics please choose diet or no sugar options) and Gatorade (diabetics please choose diet or no sugar options)                            Enhanced Recovery after Surgery for Orthopedics Enhanced Recovery after Surgery is a protocol used to improve the stress on your body and your recovery after surgery.  Patient Instructions  . The night before surgery:  o No food after midnight. ONLY clear liquids after midnight  .  Marland Kitchen The day of surgery (if you do NOT have diabetes):  o Drink ONE (1) Pre-Surgery Clear Ensure by ___4:30__ am the morning of surgery   o This drink was given to you during your hospital  pre-op appointment visit. o Nothing else to drink after completing the  Pre-Surgery Clear Ensure.            If you have questions, please contact your surgeon's office.    Take these medicines the morning of surgery with A SIP OF WATER :              Eye drops if needed             Gabapentin (neurontin)             Oxycodone if needed               7 days prior to surgery STOP taking any Aspirin (unless otherwise instructed by your surgeon), Aleve, Naproxen, Ibuprofen, Motrin, Advil,  Goody's, BC's, all herbal medications, fish oil, and all vitamins.   Stop xarelto per Dr. [name]    Do not wear jewelry.  Do not wear lotions, powders, or perfumes, or deodorant.  Do not shave 48 hours prior to surgery.  Men may shave face and neck.  Do not bring valuables to the hospital.  [facility] is not responsible for any belongings or valuables.  Contacts, dentures or bridgework may not be worn into surgery.  Leave your suitcase in the car.  After surgery it may be brought to your room.  For patients admitted to the hospital, discharge time will be determined by your treatment team.  Patients discharged the day of surgery will not be allowed to drive home.    Special instructions:  [facility]- Preparing For Surgery  Before surgery, you can play an important role. Because skin is not sterile, your skin needs to be as free of germs as possible. You can reduce the number of germs on your skin by  washing with CHG (chlorahexidine gluconate) Soap before surgery.  CHG is an antiseptic cleaner which kills germs and bonds with the skin to continue killing germs even after washing.    Oral Hygiene is also important to reduce your risk of infection.  Remember - BRUSH YOUR TEETH THE MORNING OF SURGERY WITH YOUR REGULAR TOOTHPASTE  Please do not use if you have an allergy to CHG or antibacterial soaps. If your skin becomes reddened/irritated stop using the CHG.  Do not shave (including legs and underarms) for at least 48 hours prior to first CHG shower. It is OK to shave your face.  Please follow these instructions carefully.   1. Shower the NIGHT BEFORE SURGERY and the MORNING OF SURGERY with CHG.   2. If you chose to wash your hair, wash your hair first as usual with your normal shampoo.  3. After you shampoo, rinse your hair and body thoroughly to remove the shampoo.  4. Use CHG as you would any other liquid soap. You can apply CHG directly to the skin and wash gently with a scrungie  or a clean washcloth.   5. Apply the CHG Soap to your body ONLY FROM THE NECK DOWN.  Do not use on open wounds or open sores. Avoid contact with your eyes, ears, mouth and genitals (private parts). Wash Face and genitals (private parts)  with your normal soap.  6. Wash thoroughly, paying special attention to the area where your surgery will be performed.  7. Thoroughly rinse your body with warm water from the neck down.  8. DO NOT shower/wash with your normal soap after using and rinsing off the CHG Soap.  9. Pat yourself dry with a CLEAN TOWEL.  10. Wear CLEAN PAJAMAS to bed the night before surgery, wear comfortable clothes the morning of surgery  11. Place CLEAN SHEETS on your bed the night of your first shower and DO NOT SLEEP WITH PETS.    Day of Surgery:  Do not apply any deodorants/lotions.  Please wear clean clothes to the hospital/surgery center.   Remember to brush your teeth WITH YOUR REGULAR TOOTHPASTE.    Please read over the following fact sheets that you were given.

## 2020-08-06 NOTE — Progress Notes (Signed)
PCP - Glendale Chard Cardiologist - chandrasekhar   Chest x-ray - 08/06/20 EKG -  07/14/20 Stress Test - na ECHO - 11/12/19 Cardiac Cath - na  Sleep Study - na    Blood Thinner Instructions: stop 3 days prior to surgery Aspirin Instructions:  na  ERAS Protcol - yes PRE-SURGERY Ensure given  COVID TEST- 08/06/20   Anesthesia review:   Patient denies shortness of breath, fever, cough and chest pain at PAT appointment   All instructions explained to the patient, with a verbal understanding of the material. Patient agrees to go over the instructions while at home for a better understanding. Patient also instructed to self quarantine after being tested for COVID-19. The opportunity to ask questions was provided.

## 2020-08-07 ENCOUNTER — Encounter (HOSPITAL_COMMUNITY): Payer: Self-pay

## 2020-08-07 ENCOUNTER — Other Ambulatory Visit (HOSPITAL_COMMUNITY)
Admission: RE | Admit: 2020-08-07 | Discharge: 2020-08-07 | Disposition: A | Discharge status: Home / Self Care | Payer: Managed Care, Other (non HMO) | Source: Ambulatory Visit | Attending: Specialist | Admitting: Specialist

## 2020-08-07 ENCOUNTER — Inpatient Hospital Stay (HOSPITAL_COMMUNITY): Admission: RE | Admit: 2020-08-07 | Payer: Managed Care, Other (non HMO) | Source: Ambulatory Visit

## 2020-08-07 DIAGNOSIS — Z20822 Contact with and (suspected) exposure to covid-19: Secondary | ICD-10-CM | POA: Insufficient documentation

## 2020-08-07 DIAGNOSIS — Z01812 Encounter for preprocedural laboratory examination: Secondary | ICD-10-CM | POA: Insufficient documentation

## 2020-08-07 LAB — SARS CORONAVIRUS 2 (TAT 6-24 HRS): SARS Coronavirus 2: NEGATIVE

## 2020-08-07 LAB — RAPID URINE DRUG SCREEN, HOSP PERFORMED
Amphetamines: NOT DETECTED
Barbiturates: NOT DETECTED
Benzodiazepines: NOT DETECTED
Cocaine: NOT DETECTED
Opiates: POSITIVE — AB
Tetrahydrocannabinol: NOT DETECTED

## 2020-08-07 NOTE — Anesthesia Preprocedure Evaluation (Addendum)
Anesthesia Evaluation  Patient identified by MRN, date of birth, ID band Patient awake    Reviewed: Allergy & Precautions, NPO status , Patient's Chart, lab work & pertinent test results  Airway Mallampati: I  TM Distance: >3 FB Neck ROM: Full    Dental   Pulmonary Current Smoker and Patient abstained from smoking.,  H/O PE on Zarelta   Pulmonary exam normal        Cardiovascular Normal cardiovascular exam     Neuro/Psych Anxiety Depression    GI/Hepatic GERD  Medicated and Controlled,  Endo/Other    Renal/GU      Musculoskeletal   Abdominal   Peds  Hematology   Anesthesia Other Findings   Reproductive/Obstetrics                            Anesthesia Physical Anesthesia Plan  ASA: III  Anesthesia Plan: General   Post-op Pain Management:    Induction: Intravenous  PONV Risk Score and Plan: 1 and Ondansetron and Midazolam  Airway Management Planned: Oral ETT  Additional Equipment:   Intra-op Plan:   Post-operative Plan: Extubation in OR  Informed Consent: I have reviewed the patients History and Physical, chart, labs and discussed the procedure including the risks, benefits and alternatives for the proposed anesthesia with the patient or authorized representative who has indicated his/her understanding and acceptance.       Plan Discussed with: CRNA and Surgeon  Anesthesia Plan Comments: (PAT note written 08/07/2020 by Myra Gianotti, PA-C. )       Anesthesia Quick Evaluation

## 2020-08-07 NOTE — Progress Notes (Addendum)
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  History includes smoking, PE (11/27/2018, unprovoked), sickle cell trait, back injury/chronic back pain (s/p hemilaminectomies L4-5 with microdiscectomy 08/28/17), GERD, anxiety, insomnia, migraines. Family history of adverse reaction to anesthesia is listed, but I called him and he denied so this was removed. Former heavy alcohol use in the past (8 years ago). Denied current substance use (previous UDS + cocaine 11/27/18, amphetamines & opiates 12/09/19; currently on oxycodone TID).   Preoperative medical evaluation done on 06/10/20 per Minette Brine, FNP. She referred patient to cardiology given bradycardia.   Preoperative cardiac input outlined by Dr. Gasper Sells on 07/14/20: "Preoperative Risk Assessmentwith prior sinus bradycardia, pulmonary embolism and non-cardiac chest pain - The Revised Cardiac Risk Index =0, which equates to0.4%estimated risk of perioperative myocardial infarction, pulmonary edema, ventricular fibrillation, cardiac arrest, or complete heart block.  -DASI 46.7- 7.25 fMETS - No further cardiac testing is recommended prior to surgery.  - The patient may proceed to surgery at acceptable risk. - given unclear hx of PE (Provoked or unprovoked) would be at higher risk of post operative PE - Our service is available as needed in the peri-operative period."  He reported instructions to hold Xarelto for 3 days prior to surgery.  08/07/20 presurgical COVID-19 test in process. UDS ordered per surgeon--done with PAT labs and was negative other than opiates (which is expected finding). Confirmed with Dr. [name] that we  should not have to repeat UDS on the day of surgery.    Anesthesia team to evaluate on the day of surgery.    VS: BP 105/81   Pulse 91   Temp 37 C (Oral)   Resp 18   Ht 5\' 8"  (1.727 m)   Wt 68.9 kg   SpO2 100%   BMI 23.10 kg/m    PROVIDERS: Glendale Chard, MD is PCP  Gil[place] Silvius, MD is cardiologist. Seen for preoperative evaluation 07/14/20 with as needed follow-up recommended.    LABS: Labs reviewed: Acceptable for surgery. (all labs ordered are listed, but only abnormal results are displayed)  Labs Reviewed  SURGICAL PCR SCREEN - Abnormal; Notable for the following components:      Result Value   Staphylococcus aureus POSITIVE (*)    All other components within normal limits  CBC - Abnormal; Notable for the following components:   RBC 5.85 (*)    All other components within normal limits  COMPREHENSIVE METABOLIC PANEL - Abnormal; Notable for the following components:   Glucose, Bld 112 (*)    Creatinine, Ser 1.29 (*)    Total Bilirubin 1.4 (*)    All other components within normal limits  URINALYSIS, ROUTINE W REFLEX MICROSCOPIC - Abnormal; Notable for the following components:   Color, Urine AMBER (*)    APPearance HAZY (*)    Hgb urine dipstick MODERATE (*)    Protein, ur 30 (*)    Bacteria, UA FEW (*)    All other components within normal limits     IMAGES: MRI L-spine 05/09/20: IMPRESSION: 1. Unchanged examination with mild bilateral L4-L5 foraminal stenosis. 2. Unchanged small central disc protrusion at L5-S1 without stenosis.  MRI C-spine 05/09/20:  IMPRESSION: 1. Unchanged examination with moderate right C5-6 and severe left C6-7 neural foraminal stenosis. 2. No cervical spinal canal stenosis.   EKG: 07/14/20: SR   CV: Echo 11/12/19: IMPRESSIONS  1. Left ventricular ejection fraction, by visual estimation, is 55 to  60%. The left ventricle has normal function. There is no left ventricular  hypertrophy.  2. Left ventricular  diastolic parameters are consistent with Grade I  diastolic dysfunction (impaired relaxation).  3. The left ventricle has no regional wall motion abnormalities.  4. Global right ventricle has normal systolic function.The right  ventricular size is normal. No increase in right ventricular wall  thickness.  5. Left atrial size was normal.  6. Right atrial size was normal.  7. The mitral valve is normal in structure. No evidence of mitral valve  regurgitation. No evidence of mitral stenosis.  8. The tricuspid valve is normal in structure.  9. The tricuspid valve is normal in structure. Tricuspid valve  regurgitation is not demonstrated.  10. The aortic valve is normal in structure. Aortic valve regurgitation is  not visualized. No evidence of aortic valve sclerosis or stenosis.  11. The pulmonic valve was normal in structure. Pulmonic valve  regurgitation is not visualized.  12. The inferior vena cava is normal in size with greater than 50%  respiratory variability, suggesting right atrial pressure of 3 mmHg.  13. No significant change from prior study.  14. Prior images reviewed side by side.   Past Medical History:  Diagnosis Date  . Anxiety   . Back injury   . Cervical disc disease   . Chronic back pain   . Chronic low back pain 03/29/2016  . GERD (gastroesophageal reflux disease)   . History of kidney stones    per mother- "there was a diagnoses at some point"  . Insomnia   . Lumbar disc disease   . MIGRAINE, COMMON 09/23/2010   Qualifier: Diagnosis of  By: Jenny Reichmann MD, Hunt Oris   . Pneumonia   . Pulmonary embolism ([place]) 11/2018  . Sickle cell trait [facility])     Past Surgical History:  Procedure Laterality Date  . BACK SURGERY    . BIOPSY  11/12/2019   Procedure: BIOPSY;  Surgeon: Thornton Park, MD;  Location: [facility];  Service: Gastroenterology;;  . ESOPHAGOGASTRODUODENOSCOPY (EGD) WITH PROPOFOL N/A 11/12/2019   Procedure: ESOPHAGOGASTRODUODENOSCOPY (EGD) WITH  PROPOFOL;  Surgeon: Thornton Park, MD;  Location: [facility];  Service: Gastroenterology;  Laterality: N/A;  . LUMBAR LAMINECTOMY/DECOMPRESSION MICRODISCECTOMY Bilateral 08/28/2017   Procedure: BILATERAL PARTIAL HEMILAMINECTOMIES L4-5 WITH MICRODISCECTOMY;  Surgeon: Jessy Oto, MD;  Location: [facility];  Service: Orthopedics;  Laterality: Bilateral;  . NO PAST SURGERIES      MEDICATIONS: . Aspirin-Caffeine (BC FAST PAIN RELIEF PO)  . Carboxymethylcellul-Glycerin (CLEAR EYES FOR DRY EYES) 1-0.25 % SOLN  . gabapentin (NEURONTIN) 300 MG capsule  . Oxycodone HCl 20 MG TABS  . rivaroxaban (XARELTO) 20 MG TABS tablet   No current facility-administered medications for this encounter.    Myra Gianotti, PA-C Surgical Short Stay/Anesthesiology [facility] Phone [phone] [facility] Phone [phone] 08/07/2020 2:35 PM

## 2020-08-10 ENCOUNTER — Ambulatory Visit (HOSPITAL_COMMUNITY)
Admission: RE | Disposition: A | Discharge status: Home / Self Care | Payer: Self-pay | Source: Home / Self Care | Attending: Specialist

## 2020-08-10 ENCOUNTER — Ambulatory Visit (HOSPITAL_COMMUNITY): Payer: Managed Care, Other (non HMO) | Admitting: Vascular Surgery

## 2020-08-10 ENCOUNTER — Ambulatory Visit (HOSPITAL_COMMUNITY): Payer: Managed Care, Other (non HMO)

## 2020-08-10 ENCOUNTER — Encounter (HOSPITAL_COMMUNITY): Payer: Self-pay | Admitting: Specialist

## 2020-08-10 ENCOUNTER — Other Ambulatory Visit: Payer: Self-pay

## 2020-08-10 ENCOUNTER — Observation Stay (HOSPITAL_COMMUNITY)
Admission: RE | Admit: 2020-08-10 | Discharge: 2020-08-11 | Disposition: A | Discharge status: Home / Self Care | Payer: Managed Care, Other (non HMO) | Attending: Specialist | Admitting: Specialist

## 2020-08-10 DIAGNOSIS — M47812 Spondylosis without myelopathy or radiculopathy, cervical region: Secondary | ICD-10-CM | POA: Diagnosis not present

## 2020-08-10 DIAGNOSIS — F17210 Nicotine dependence, cigarettes, uncomplicated: Secondary | ICD-10-CM | POA: Insufficient documentation

## 2020-08-10 DIAGNOSIS — Z7982 Long term (current) use of aspirin: Secondary | ICD-10-CM | POA: Insufficient documentation

## 2020-08-10 DIAGNOSIS — M542 Cervicalgia: Secondary | ICD-10-CM | POA: Diagnosis present

## 2020-08-10 DIAGNOSIS — M4722 Other spondylosis with radiculopathy, cervical region: Principal | ICD-10-CM | POA: Insufficient documentation

## 2020-08-10 DIAGNOSIS — M961 Postlaminectomy syndrome, not elsewhere classified: Secondary | ICD-10-CM | POA: Diagnosis present

## 2020-08-10 DIAGNOSIS — Z419 Encounter for procedure for purposes other than remedying health state, unspecified: Secondary | ICD-10-CM

## 2020-08-10 HISTORY — PX: POSTERIOR CERVICAL FUSION/FORAMINOTOMY: SHX5038

## 2020-08-10 LAB — HIV ANTIBODY (ROUTINE TESTING W REFLEX): HIV Screen 4th Generation wRfx: NONREACTIVE

## 2020-08-10 SURGERY — POSTERIOR CERVICAL FUSION/FORAMINOTOMY LEVEL 1
Anesthesia: General

## 2020-08-10 MED ORDER — FENTANYL CITRATE (PF) 250 MCG/5ML IJ SOLN
INTRAMUSCULAR | Status: AC
  Filled 2020-08-10: qty 5

## 2020-08-10 MED ORDER — POLYETHYLENE GLYCOL 3350 17 G PO PACK: 17.0000 g | PACK | Freq: Every day | ORAL | Status: DC | PRN

## 2020-08-10 MED ORDER — BUPIVACAINE HCL 0.5 % IJ SOLN
INTRAMUSCULAR | Status: DC | PRN
  Administered 2020-08-10: 4 mL

## 2020-08-10 MED ORDER — HYDROMORPHONE HCL 1 MG/ML IJ SOLN
0.5000 mg | INTRAMUSCULAR | Status: DC | PRN
  Administered 2020-08-10: 0.5 mg via INTRAVENOUS
  Filled 2020-08-10: qty 0.5

## 2020-08-10 MED ORDER — MIDAZOLAM HCL 2 MG/2ML IJ SOLN
INTRAMUSCULAR | Status: DC | PRN
  Administered 2020-08-10: 2 mg via INTRAVENOUS

## 2020-08-10 MED ORDER — ONDANSETRON HCL 4 MG PO TABS: 4.0000 mg | ORAL_TABLET | Freq: Four times a day (QID) | ORAL | Status: DC | PRN

## 2020-08-10 MED ORDER — ACETAMINOPHEN 325 MG PO TABS
650.0000 mg | ORAL_TABLET | ORAL | Status: DC | PRN
  Administered 2020-08-10 – 2020-08-11 (×2): 650 mg via ORAL
  Filled 2020-08-10 (×2): qty 2

## 2020-08-10 MED ORDER — MUPIROCIN 2 % EX OINT
1.0000 "application " | TOPICAL_OINTMENT | Freq: Two times a day (BID) | CUTANEOUS | Status: DC
  Administered 2020-08-10: 1 via NASAL
  Filled 2020-08-10: qty 22

## 2020-08-10 MED ORDER — ALUM & MAG HYDROXIDE-SIMETH 200-200-20 MG/5ML PO SUSP: 30.0000 mL | Freq: Four times a day (QID) | ORAL | Status: DC | PRN

## 2020-08-10 MED ORDER — 0.9 % SODIUM CHLORIDE (POUR BTL) OPTIME
TOPICAL | Status: DC | PRN
  Administered 2020-08-10: 1000 mL

## 2020-08-10 MED ORDER — ROCURONIUM 10MG/ML (10ML) SYRINGE FOR MEDFUSION PUMP - OPTIME
INTRAVENOUS | Status: DC | PRN
  Administered 2020-08-10: 80 mg via INTRAVENOUS

## 2020-08-10 MED ORDER — METHOCARBAMOL 1000 MG/10ML IJ SOLN
500.0000 mg | Freq: Four times a day (QID) | INTRAVENOUS | Status: DC | PRN
  Filled 2020-08-10: qty 5

## 2020-08-10 MED ORDER — DEXMEDETOMIDINE (PRECEDEX) IN NS 20 MCG/5ML (4 MCG/ML) IV SYRINGE
PREFILLED_SYRINGE | INTRAVENOUS | Status: DC | PRN
  Administered 2020-08-10: 20 ug via INTRAVENOUS

## 2020-08-10 MED ORDER — SUGAMMADEX SODIUM 200 MG/2ML IV SOLN
INTRAVENOUS | Status: DC | PRN
  Administered 2020-08-10: 200 mg via INTRAVENOUS

## 2020-08-10 MED ORDER — POLYVINYL ALCOHOL 1.4 % OP SOLN
1.0000 [drp] | Freq: Every day | OPHTHALMIC | Status: DC | PRN
  Filled 2020-08-10: qty 15

## 2020-08-10 MED ORDER — SODIUM CHLORIDE 0.9 % IV SOLN: 250.0000 mL | INTRAVENOUS | Status: DC

## 2020-08-10 MED ORDER — PROPOFOL 10 MG/ML IV BOLUS
INTRAVENOUS | Status: DC | PRN
  Administered 2020-08-10: 100 mg via INTRAVENOUS

## 2020-08-10 MED ORDER — PHENOL 1.4 % MT LIQD: 1.0000 | OROMUCOSAL | Status: DC | PRN

## 2020-08-10 MED ORDER — PANTOPRAZOLE SODIUM 40 MG PO TBEC
40.0000 mg | DELAYED_RELEASE_TABLET | Freq: Every day | ORAL | Status: DC
  Administered 2020-08-10: 40 mg via ORAL
  Filled 2020-08-10: qty 1

## 2020-08-10 MED ORDER — SUFENTANIL CITRATE 50 MCG/ML IV SOLN
INTRAVENOUS | Status: DC | PRN
  Administered 2020-08-10: 20 ug via INTRAVENOUS
  Administered 2020-08-10: 30 ug via INTRAVENOUS

## 2020-08-10 MED ORDER — ORAL CARE MOUTH RINSE: 15.0000 mL | Freq: Once | OROMUCOSAL | Status: AC

## 2020-08-10 MED ORDER — CARBOXYMETHYLCELLUL-GLYCERIN 1-0.25 % OP SOLN: 1.0000 [drp] | Freq: Every day | OPHTHALMIC | Status: DC | PRN

## 2020-08-10 MED ORDER — PROPOFOL 10 MG/ML IV BOLUS
INTRAVENOUS | Status: AC
  Filled 2020-08-10: qty 20

## 2020-08-10 MED ORDER — ONDANSETRON HCL 4 MG/2ML IJ SOLN
INTRAMUSCULAR | Status: DC | PRN
  Administered 2020-08-10: 4 mg via INTRAVENOUS

## 2020-08-10 MED ORDER — MORPHINE SULFATE (PF) 2 MG/ML IV SOLN: 1.0000 mg | INTRAVENOUS | Status: DC | PRN

## 2020-08-10 MED ORDER — THROMBIN 20000 UNITS EX SOLR
CUTANEOUS | Status: DC | PRN
  Administered 2020-08-10: 20000 [IU] via TOPICAL

## 2020-08-10 MED ORDER — GLYCOPYRROLATE 0.2 MG/ML IJ SOLN
INTRAMUSCULAR | Status: DC | PRN
  Administered 2020-08-10: .2 mg via INTRAVENOUS

## 2020-08-10 MED ORDER — BISACODYL 5 MG PO TBEC: 5.0000 mg | DELAYED_RELEASE_TABLET | Freq: Every day | ORAL | Status: DC | PRN

## 2020-08-10 MED ORDER — PANTOPRAZOLE SODIUM 40 MG IV SOLR: 40.0000 mg | Freq: Every day | INTRAVENOUS | Status: DC

## 2020-08-10 MED ORDER — THROMBIN (RECOMBINANT) 20000 UNITS EX SOLR
CUTANEOUS | Status: AC
  Filled 2020-08-10: qty 20000

## 2020-08-10 MED ORDER — BUPIVACAINE HCL (PF) 0.5 % IJ SOLN
INTRAMUSCULAR | Status: AC
  Filled 2020-08-10: qty 30

## 2020-08-10 MED ORDER — MIDAZOLAM HCL 2 MG/2ML IJ SOLN
INTRAMUSCULAR | Status: AC
  Filled 2020-08-10: qty 2

## 2020-08-10 MED ORDER — ACETAMINOPHEN 650 MG RE SUPP: 650.0000 mg | RECTAL | Status: DC | PRN

## 2020-08-10 MED ORDER — LIDOCAINE HCL (CARDIAC) PF 100 MG/5ML IV SOSY
PREFILLED_SYRINGE | INTRAVENOUS | Status: DC | PRN
  Administered 2020-08-10: 100 mg via INTRAVENOUS

## 2020-08-10 MED ORDER — MENTHOL 3 MG MT LOZG: 1.0000 | LOZENGE | OROMUCOSAL | Status: DC | PRN

## 2020-08-10 MED ORDER — PHENYLEPHRINE HCL (PRESSORS) 10 MG/ML IV SOLN
INTRAVENOUS | Status: DC | PRN
  Administered 2020-08-10 (×2): 100 ug via INTRAVENOUS

## 2020-08-10 MED ORDER — SODIUM CHLORIDE 0.9% FLUSH
3.0000 mL | Freq: Two times a day (BID) | INTRAVENOUS | Status: DC
  Administered 2020-08-10: 3 mL via INTRAVENOUS

## 2020-08-10 MED ORDER — METHOCARBAMOL 500 MG PO TABS
500.0000 mg | ORAL_TABLET | Freq: Four times a day (QID) | ORAL | Status: DC | PRN
  Administered 2020-08-10 – 2020-08-11 (×2): 500 mg via ORAL
  Filled 2020-08-10 (×2): qty 1

## 2020-08-10 MED ORDER — HYDROMORPHONE HCL 1 MG/ML IJ SOLN
INTRAMUSCULAR | Status: DC | PRN
  Administered 2020-08-10: .5 mg via INTRAVENOUS

## 2020-08-10 MED ORDER — ONDANSETRON HCL 4 MG/2ML IJ SOLN: 4.0000 mg | Freq: Once | INTRAMUSCULAR | Status: DC | PRN

## 2020-08-10 MED ORDER — MEPERIDINE HCL 25 MG/ML IJ SOLN: 6.2500 mg | INTRAMUSCULAR | Status: DC | PRN

## 2020-08-10 MED ORDER — ASPIRIN-SALICYLAMIDE-CAFFEINE 650-195-33.3 MG PO PACK: PACK | Freq: Every day | ORAL | Status: DC | PRN

## 2020-08-10 MED ORDER — ONDANSETRON HCL 4 MG/2ML IJ SOLN: 4.0000 mg | Freq: Four times a day (QID) | INTRAMUSCULAR | Status: DC | PRN

## 2020-08-10 MED ORDER — OXYCODONE HCL 5 MG PO TABS
10.0000 mg | ORAL_TABLET | ORAL | Status: DC | PRN
  Administered 2020-08-10 – 2020-08-11 (×5): 10 mg via ORAL
  Filled 2020-08-10 (×5): qty 2

## 2020-08-10 MED ORDER — BUPIVACAINE LIPOSOME 1.3 % IJ SUSP
20.0000 mL | Freq: Once | INTRAMUSCULAR | Status: AC
  Administered 2020-08-10: 4 mL
  Filled 2020-08-10: qty 20

## 2020-08-10 MED ORDER — PHENYLEPHRINE HCL-NACL 10-0.9 MG/250ML-% IV SOLN
INTRAVENOUS | Status: DC | PRN
  Administered 2020-08-10: 25 ug/min via INTRAVENOUS

## 2020-08-10 MED ORDER — SODIUM CHLORIDE 0.9 % IV SOLN: INTRAVENOUS | Status: DC

## 2020-08-10 MED ORDER — GABAPENTIN 300 MG PO CAPS
300.0000 mg | ORAL_CAPSULE | Freq: Three times a day (TID) | ORAL | Status: DC
  Administered 2020-08-10 (×2): 300 mg via ORAL
  Filled 2020-08-10 (×2): qty 1

## 2020-08-10 MED ORDER — DOCUSATE SODIUM 100 MG PO CAPS
100.0000 mg | ORAL_CAPSULE | Freq: Two times a day (BID) | ORAL | Status: DC
  Administered 2020-08-10 (×2): 100 mg via ORAL
  Filled 2020-08-10 (×2): qty 1

## 2020-08-10 MED ORDER — SODIUM CHLORIDE 0.9% FLUSH: 3.0000 mL | INTRAVENOUS | Status: DC | PRN

## 2020-08-10 MED ORDER — DEXAMETHASONE SODIUM PHOSPHATE 10 MG/ML IJ SOLN
INTRAMUSCULAR | Status: DC | PRN
  Administered 2020-08-10: 10 mg via INTRAVENOUS

## 2020-08-10 MED ORDER — OXYCODONE HCL 5 MG PO TABS: 5.0000 mg | ORAL_TABLET | ORAL | Status: DC | PRN

## 2020-08-10 MED ORDER — LACTATED RINGERS IV SOLN: INTRAVENOUS | Status: DC | PRN

## 2020-08-10 MED ORDER — CEFAZOLIN SODIUM-DEXTROSE 1-4 GM/50ML-% IV SOLN
1.0000 g | Freq: Three times a day (TID) | INTRAVENOUS | Status: AC
  Administered 2020-08-10 (×2): 1 g via INTRAVENOUS
  Filled 2020-08-10 (×2): qty 50

## 2020-08-10 MED ORDER — HYDROMORPHONE HCL 1 MG/ML IJ SOLN
INTRAMUSCULAR | Status: AC
  Filled 2020-08-10: qty 0.5

## 2020-08-10 MED ORDER — SUFENTANIL CITRATE 50 MCG/ML IV SOLN
INTRAVENOUS | Status: AC
  Filled 2020-08-10: qty 1

## 2020-08-10 MED ORDER — HYDROMORPHONE HCL 1 MG/ML IJ SOLN: 0.2500 mg | INTRAMUSCULAR | Status: DC | PRN

## 2020-08-10 MED ORDER — CHLORHEXIDINE GLUCONATE 0.12 % MT SOLN
15.0000 mL | Freq: Once | OROMUCOSAL | Status: AC
  Administered 2020-08-10: 15 mL via OROMUCOSAL
  Filled 2020-08-10: qty 15

## 2020-08-10 MED ORDER — FLEET ENEMA 7-19 GM/118ML RE ENEM: 1.0000 | ENEMA | Freq: Once | RECTAL | Status: DC | PRN

## 2020-08-10 MED ORDER — OXYCODONE HCL ER 10 MG PO T12A
20.0000 mg | EXTENDED_RELEASE_TABLET | Freq: Two times a day (BID) | ORAL | Status: DC
  Administered 2020-08-10 (×2): 20 mg via ORAL
  Filled 2020-08-10 (×2): qty 2

## 2020-08-10 MED ORDER — LACTATED RINGERS IV SOLN: INTRAVENOUS | Status: DC

## 2020-08-10 MED ORDER — CEFAZOLIN SODIUM-DEXTROSE 2-4 GM/100ML-% IV SOLN
2.0000 g | INTRAVENOUS | Status: AC
  Administered 2020-08-10: 2 g via INTRAVENOUS
  Filled 2020-08-10: qty 100

## 2020-08-10 SURGICAL SUPPLY — 46 items
BLADE CLIPPER SURG (BLADE) ×3 IMPLANT
BUR RND FLUTED 2.5 (BURR) ×3 IMPLANT
COLLAR CERV LO CONTOUR FIRM DE (SOFTGOODS) ×3 IMPLANT
COVER SURGICAL LIGHT HANDLE (MISCELLANEOUS) ×3 IMPLANT
COVER WAND RF STERILE (DRAPES) ×3 IMPLANT
DERMABOND ADVANCED (GAUZE/BANDAGES/DRESSINGS) ×2
DERMABOND ADVANCED .7 DNX12 (GAUZE/BANDAGES/DRESSINGS) ×1 IMPLANT
DRAPE C-ARM 42X72 X-RAY (DRAPES) ×3 IMPLANT
DRAPE HALF SHEET 40X57 (DRAPES) ×6 IMPLANT
DRAPE MICROSCOPE LEICA (MISCELLANEOUS) ×3 IMPLANT
DRAPE SURG 17X23 STRL (DRAPES) ×12 IMPLANT
DRSG MEPILEX BORDER 4X4 (GAUZE/BANDAGES/DRESSINGS) ×6 IMPLANT
DRSG PAD ABDOMINAL 8X10 ST (GAUZE/BANDAGES/DRESSINGS) ×6 IMPLANT
DURAPREP 6ML APPLICATOR 50/CS (WOUND CARE) ×3 IMPLANT
ELECT CAUTERY BLADE 6.4 (BLADE) ×3 IMPLANT
ELECT REM PT RETURN 9FT ADLT (ELECTROSURGICAL) ×3
ELECTRODE REM PT RTRN 9FT ADLT (ELECTROSURGICAL) ×1 IMPLANT
GAUZE SPONGE 4X4 12PLY STRL (GAUZE/BANDAGES/DRESSINGS) ×3 IMPLANT
GLOVE BIOGEL PI IND STRL 8 (GLOVE) ×1 IMPLANT
GLOVE BIOGEL PI INDICATOR 8 (GLOVE) ×2
GLOVE ECLIPSE 9.0 STRL (GLOVE) ×3 IMPLANT
GLOVE ORTHO TXT STRL SZ7.5 (GLOVE) ×3 IMPLANT
GLOVE SURG 8.5 LATEX PF (GLOVE) ×3 IMPLANT
GOWN STRL REUS W/ TWL LRG LVL3 (GOWN DISPOSABLE) ×1 IMPLANT
GOWN STRL REUS W/TWL 2XL LVL3 (GOWN DISPOSABLE) ×6 IMPLANT
GOWN STRL REUS W/TWL LRG LVL3 (GOWN DISPOSABLE) ×3
KIT BASIN OR (CUSTOM PROCEDURE TRAY) ×3 IMPLANT
KIT TURNOVER KIT B (KITS) ×3 IMPLANT
NEEDLE SPNL 18GX3.5 QUINCKE PK (NEEDLE) ×3 IMPLANT
NS IRRIG 1000ML POUR BTL (IV SOLUTION) ×3 IMPLANT
PACK ORTHO CERVICAL (CUSTOM PROCEDURE TRAY) ×3 IMPLANT
PAD ARMBOARD 7.5X6 YLW CONV (MISCELLANEOUS) ×6 IMPLANT
PATTIES SURGICAL .25X.25 (GAUZE/BANDAGES/DRESSINGS) ×3 IMPLANT
SPONGE SURGIFOAM ABS GEL 100 (HEMOSTASIS) ×3 IMPLANT
SPONGE SURGIFOAM ABS GEL 100C (HEMOSTASIS) ×3 IMPLANT
SUT VIC AB 0 CT1 27 (SUTURE) ×6
SUT VIC AB 0 CT1 27XBRD ANBCTR (SUTURE) ×2 IMPLANT
SUT VIC AB 2-0 CT1 27 (SUTURE) ×6
SUT VIC AB 2-0 CT1 TAPERPNT 27 (SUTURE) ×2 IMPLANT
SUT VIC AB 2-0 UR6 27 (SUTURE) ×3 IMPLANT
SUT VIC AB 3-0 X1 27 (SUTURE) ×3 IMPLANT
SUT VICRYL 0 UR6 27IN ABS (SUTURE) ×3 IMPLANT
TAPE CLOTH 4X10 WHT NS (GAUZE/BANDAGES/DRESSINGS) ×3 IMPLANT
TOWEL GREEN STERILE (TOWEL DISPOSABLE) ×3 IMPLANT
TOWEL GREEN STERILE FF (TOWEL DISPOSABLE) ×3 IMPLANT
WATER STERILE IRR 1000ML POUR (IV SOLUTION) ×3 IMPLANT

## 2020-08-10 NOTE — Discharge Instructions (Addendum)
    No lifting greater than 10 lbs. Avoid bending, stooping and twisting. Walking in house for first week then may start to get out slowly increasing activity using arms. Keep incision dry for 3 days, may use tegaderm or similar water impervious dressing. Avoid overhead use of arms and overhead lifting. Wear collar for comfort. Use ice as needed for comfort. Restart xarelto the evening of 08/11/2020.

## 2020-08-10 NOTE — Brief Op Note (Signed)
08/10/2020  9:42 AM  PATIENT:  Jason [name]  39 y.o. 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 Dominica Severin.   EBL:  25 mL   BLOOD ADMINISTERED:none  DRAINS: none   LOCAL MEDICATIONS USED:  MARCAINE 0.5% 1:1 EXPAREL 1/3% Amount: 15 ml  SPECIMEN:  No Specimen  DISPOSITION OF SPECIMEN:  N/A  COUNTS:  YES  TOURNIQUET:  * No tourniquets in log *  DICTATION: .Dragon Dictation  PLAN OF CARE: Admit for overnight observation  PATIENT DISPOSITION:  PACU - hemodynamically stable.   Delay start of Pharmacological VTE agent (>24hrs) due to surgical blood loss or risk of bleeding: yes

## 2020-08-10 NOTE — Transfer of Care (Signed)
Immediate Anesthesia Transfer of Care Note  Patient: Jason [name]  Procedure(s) Performed: LEFT C6-7 FORAMINOTOMY (N/A )  Patient Location: PACU  Anesthesia Type:General  Level of Consciousness: awake  Airway & Oxygen Therapy: Patient Spontanous Breathing  Post-op Assessment: Report given to RN and Post -op Vital signs reviewed and stable  Post vital signs: Reviewed and stable  Last Vitals:  Vitals Value Taken Time  BP 111/68 08/10/20 1007  Temp    Pulse 91 08/10/20 1009  Resp 21 08/10/20 1009  SpO2 100 % 08/10/20 1009  Vitals shown include unvalidated device data.  Last Pain:  Vitals:   08/10/20 0617  TempSrc:   PainSc: 6       Patients Stated Pain Goal: 3 (28/78/67 6720)  Complications: No complications documented.

## 2020-08-10 NOTE — Anesthesia Postprocedure Evaluation (Signed)
Anesthesia Post Note  Patient: Jason [name]  Procedure(s) Performed: LEFT C6-7 FORAMINOTOMY (N/A )     Patient location during evaluation: PACU Anesthesia Type: General Level of consciousness: awake and alert Pain management: pain level controlled Vital Signs Assessment: post-procedure vital signs reviewed and stable Respiratory status: spontaneous breathing, nonlabored ventilation, respiratory function stable and patient connected to nasal cannula oxygen Cardiovascular status: blood pressure returned to baseline and stable Postop Assessment: no apparent nausea or vomiting Anesthetic complications: no   No complications documented.  Last Vitals:  Vitals:   08/10/20 1113 08/10/20 1552  BP: (!) 113/54 116/82  Pulse: 78 69  Resp: 16 16  Temp:  37.3 C  SpO2: 98% 99%    Last Pain:  Vitals:   08/10/20 1552  TempSrc: Oral  PainSc:                  Naidelin Gugliotta DAVID

## 2020-08-10 NOTE — Progress Notes (Signed)
Orthopedic Tech Progress Note Patient Details:  Jason [name] [birth year] [id number] pt has soft collar Patient ID: Jason [name], male   DOB: 04/01/1981, 39 y.o.   MRN: [id number]   Jason [name] 08/10/2020, 11:33 AM

## 2020-08-10 NOTE — Anesthesia Procedure Notes (Signed)
Procedure Name: Intubation Date/Time: 08/10/2020 7:41 AM Performed by: Terrence Dupont, RN Pre-anesthesia Checklist: Patient identified, Emergency Drugs available, Suction available and Patient being monitored Patient Re-evaluated:Patient Re-evaluated prior to induction Oxygen Delivery Method: Circle System Utilized Preoxygenation: Pre-oxygenation with 100% oxygen Induction Type: IV induction Ventilation: Mask ventilation without difficulty Laryngoscope Size: Mac and 4 Grade View: Grade I Tube type: Oral Tube size: 7.5 mm Number of attempts: 1 Airway Equipment and Method: Stylet Placement Confirmation: ETT inserted through vocal cords under direct vision,  positive ETCO2 and breath sounds checked- equal and bilateral Secured at: 21 cm Tube secured with: Tape Dental Injury: Teeth and Oropharynx as per pre-operative assessment

## 2020-08-10 NOTE — Interval H&P Note (Signed)
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  The various methods of treatment have been discussed with the patient and family. After consideration of risks, benefits and other options for treatment, the patient has consented to  Procedure(s): LEFT C6-7 FORAMINOTOMY (N/A) as a surgical intervention.  [name] history has been reviewed, patient examined, no change in status, stable for surgery.  I have reviewed the patient's chart and labs.  Questions were answered to the patient's satisfaction.     [name]

## 2020-08-10 NOTE — Op Note (Signed)
08/10/2020  9:44 AM  PATIENT:  Jason [name]  39 y.o. male  MRN: [id number]  OPERATIVE REPORT  PRE-OPERATIVE DIAGNOSIS:  cervical spondylosis with radiculopathy left C6-7  POST-OPERATIVE DIAGNOSIS:  cervical spondylosis with radiculopathy left C6-7  PROCEDURE:  Procedure(s): LEFT C6-7 FORAMINOTOMY    SURGEON:  Jessy Oto, MD     ASSISTANT:  Benjiman Core, PA-C  (Present throughout the entire procedure and necessary for completion of procedure in a timely manner)     ANESTHESIA:  General,supplemented with local marcaine 0.5% 1:1 exparel  1.3% total 15 cc. Dr. Conrad Elroy, Lynnae Sandhoff.   EBL 50cc   DRAINS: None.     COMPLICATIONS:  None.   PROCEDURE:The patient was met in the holding area, and the appropriate left C6-7 cervical level identified and marked with "x" and my initials. All questions were answered and informed consent signed.   The patient was then transported to OR. The patient was then placed under general anesthesia without difficulty. A foley catheter was placed sterilely by OR nursing personnel. and transferred to the operating room table prone position [place] horseshoe with chest rolls. All pressure points well-padded PAS stockings.Shoulders taped down and skin over the posterior inferior aspect of the neck place in traction to decrease skin folds. The patient received appropriate preoperative antibiotic1 gram ancef. prophylaxis.Time-out procedure was called and correct.   Sterile prep with DuraPrep and draped in the usual manner the shoulders were taped downwards and skin traction over the skin of the neck. Following DuraPrep draped in the usual manner. After timeout protocol incision was made approximately C6 to C7 in the midline. This following infiltration of skin and subcutaneous layers with marcaine 0.5% 1:1 exparel total of 15 cc. Incision carried through skin and subcutaneous layers using 10 blade scalpel and electrocautery down to the level ligamentum nuchae.  Incision made along the left lateral aspect of the spinous process of C6. Clamp then placed at the spinous process of C6. Intraoperative C-arm fluoroscopy identified the clamp at the C6 spinous process level. Then a 0 vicryl suture was used to mark the spinous process of C6. Electrocautery then used to carefully incise the cervical muscles off the left lateral aspect of the spinous process of C6 and C7. Dividing the  spinous muscles off of the inferior aspect lamina at C6 exposing the C6-7 posterior aspect of the interlaminar space. The magnification headlamp were used during this portion procedure. Boss McCollough retractor was inserted. High-speed bur was used to remove a small portion of bone from the inferior aspect of lamina of C6 and the medial 20% of the inferior articular process of C6. Further thinning the superior aspect of the lamina right C7. A 1 mm Kerrison was then used to remove him from superior aspect of the lamina C7 and the medial aspect of the inferior articular process of C6 the 20%, exposing the superior articular process of C7. A 1 mm Kerrison was removed bone off the medial aspect of the superior articular process of C7 resecting 20% of the medial aspect of the superior articular process of C7. Ligamentum flavum then easily lifted superiorly  electrocautery unit cauterizing epidural veins deep to the ligamentum flavum  then resecting the ligamentum flavum. The operating room microscope was draped sterilely and brought into the field. Under the operating room microscope the epidural vein layer overlying the posterior aspect of the thecal sac and the C7 nerve root was then carefully lifted using a micro-titanium nerve hook then cauterized using bipolar electrocautery a #  15 blade scalpel then used to incise this overlying the C7 nerve root releasing the vascular leash a backward angle 3-0 microcurette then used to remove a small portion of bone off the superior and medial aspect of the pedicle  further mobilizing the C7 nerve root bipolar electrocautery to control all bleeding within the axillary area of the C7 nerve. Bone wax was applied to bleeding cancellus bone surfaces are excellent hemostasis obtained   Following this then hemostasis was obtained using thrombin-soaked Gelfoam and micro-pledgettes. When complete hemostasis was obtained all gel foam was removed the nerve hook could be easily passed out the neuroforamen without the lateral aspect of the C7 pedicle demonstrate the C7 neuroforamen completely decompressed. Irrigation was carried out no active bleeding was present. The incision was closed by approximating the ligamentum nuchae with #1 ethibond sutures. The subcutaneous layers approximated with interrupted 0 Vicryl suture more superficial layers with interrupted 2-0 Vicryl sutures and the skin closed with interrupted 4-0 Vicryl sutures. Dermabond was applied then [employer] bandage. Soft cervical collar placed.  All instrument and sponge counts were correct. Patient was then returned to supine position on her stretcher. Returned to recovery room in satisfactory condition.   Physician assistant's responsibilities: Benjiman Core, PA-C perform the duties of assistant physician and surgeon during this case present from the beginning of the case to the end of the case. He assisted with careful retraction of neural structures suctioning about her elements including cervical cord and C7 nerve root. Performed closure of the incision on the ligamentum nuchae to the skin and application of dressing. He assisted in positioning the patient had removal the patient from the OR table to the stretcher.   [name] 08/10/2020, 9:44 AM

## 2020-08-10 NOTE — H&P (Signed)
Jason [name] is an 39 y.o. male.   Chief Complaint: neck pain and UE radiculopathy HPI: 39 year old black male history of left C6-7 HNP neck pain and left upper extremity radiculopathy comes in for preop evaluation.  States that symptoms unchanged from previous visit.  He is want to proceed with left C6-7 foraminotomies scheduled.  Today history and physical performed.  Review of systems negative.  We received preop cardiac clearance below.   Preoperative Risk Assessmentwith prior sinus bradycardia, pulmonary embolism and non-cardiac chest pain - The Revised Cardiac Risk Index =0, which equates to0.4%estimated risk of perioperative myocardial infarction, pulmonary edema, ventricular fibrillation, cardiac arrest, or complete heart block.  -DASI 46.7- 7.25 fMETS - No further cardiac testing is recommended prior to surgery.  - The patient may proceed to surgery at acceptable risk. - given unclear hx of PE (Provoked or unprovoked) would be at higher risk of post operative PE - Our service is available as needed in the peri-operative period.  PRN Follow up    Surgical procedure discussed.  Patient does have a history of substance abuse and I did add on a urine drug screen to his preop labs.  Past Medical History:  Diagnosis Date  . Anxiety   . Back injury   . Cervical disc disease   . Chronic back pain   . Chronic low back pain 03/29/2016  . GERD (gastroesophageal reflux disease)   . History of kidney stones    per mother- "there was a diagnoses at some point"  . Insomnia   . Lumbar disc disease   . MIGRAINE, COMMON 09/23/2010   Qualifier: Diagnosis of  By: Jenny Reichmann MD, Hunt Oris   . Pneumonia   . Pulmonary embolism ([place]) 11/2018  . Sickle cell trait [facility])     Past Surgical History:  Procedure Laterality Date  . BACK SURGERY    . BIOPSY  11/12/2019   Procedure: BIOPSY;  Surgeon: Thornton Park, MD;  Location: [place];  Service: Gastroenterology;;  .  ESOPHAGOGASTRODUODENOSCOPY (EGD) WITH PROPOFOL N/A 11/12/2019   Procedure: ESOPHAGOGASTRODUODENOSCOPY (EGD) WITH PROPOFOL;  Surgeon: Thornton Park, MD;  Location: [place];  Service: Gastroenterology;  Laterality: N/A;  . LUMBAR LAMINECTOMY/DECOMPRESSION MICRODISCECTOMY Bilateral 08/28/2017   Procedure: BILATERAL PARTIAL HEMILAMINECTOMIES L4-5 WITH MICRODISCECTOMY;  Surgeon: Jessy Oto, MD;  Location: [place];  Service: Orthopedics;  Laterality: Bilateral;    Family History  Problem Relation Age of Onset  . Congestive Heart Failure Father   . Hypertension Mother   . Hyperlipidemia Mother   . Stroke Mother    Social History:  reports that he has been smoking cigarettes. He has a 24.00 pack-year smoking history. He has never used smokeless tobacco. He reports previous alcohol use. He reports that he does not use drugs.  Allergies:  Allergies  Allergen Reactions  . Morphine And Related Swelling  . Other Hives and Swelling    Antibiotic that was given when he had pneumonia     Medications Prior to Admission  Medication Sig Dispense Refill  . Carboxymethylcellul-Glycerin (CLEAR EYES FOR DRY EYES) 1-0.25 % SOLN Place 1 drop into the left eye daily as needed (dry eye).    Marland Kitchen gabapentin (NEURONTIN) 300 MG capsule Take 300 mg by mouth 3 (three) times daily.    . Oxycodone HCl 20 MG TABS Take 20 mg by mouth in the morning, at noon, and at bedtime.    . rivaroxaban (XARELTO) 20 MG TABS tablet Take 20 mg by mouth daily with supper.    Marland Kitchen  Aspirin-Caffeine (BC FAST PAIN RELIEF PO) Take 1-2 packets by mouth daily as needed (headache).      No results found for this or any previous visit (from the past 48 hour(s)). No results found.  Review of Systems  Constitutional: Positive for activity change.  HENT: Negative.   Respiratory: Negative.   Cardiovascular: Negative.   Gastrointestinal: Negative.   Genitourinary: Negative.   Musculoskeletal: Positive for neck pain and neck stiffness.   Neurological: Positive for numbness.    Blood pressure 130/86, pulse 68, temperature 99.2 F (37.3 C), temperature source Oral, resp. rate 17, height 5\' 8"  (1.727 m), weight 68.9 kg, SpO2 100 %. Physical Exam HENT:     Head: Normocephalic and atraumatic.     Nose: Nose normal.  Eyes:     Extraocular Movements: Extraocular movements intact.     Pupils: Pupils are equal, round, and reactive to light.  Cardiovascular:     Rate and Rhythm: Regular rhythm.  Pulmonary:     Effort: Pulmonary effort is normal. No respiratory distress.     Breath sounds: Normal breath sounds.  Abdominal:     General: Bowel sounds are normal. There is no distension.  Musculoskeletal:        General: Tenderness present.  Neurological:     Mental Status: He is alert and oriented to person, place, and time.      Assessment/Plan Left C6-7 HNP/stenosis  Will proceed with Left C6-7 foraminotomy as scheduled.  Surgical procedure discussed and all questions answered.   Benjiman Core, PA-C 08/10/2020, 6:32 AM

## 2020-08-10 NOTE — Evaluation (Signed)
Physical Therapy Evaluation and Discharge Patient Details Name: Jason [name] MRN: [id number] DOB: December 10, 1980 Today's Date: 08/10/2020   History of Present Illness  39 year old black male history of left C6-7 HNP neck pain and left upper extremity radiculopathy comes in for preop evaluation. Patient s/p L C6-7 foraminotomy on 10/25.  Clinical Impression  Patient was independent prior to admission. Patient overall modI with all mobility and transfers. Patient is limited by pain in L shoulder at this time. Patient ambulated 250' modI, demos good balance with quick turns during ambulation. Patient does not require skilled PT services during acute stay. Anticipate no PT follow up at this time.     Follow Up Recommendations No PT follow up    Equipment Recommendations  None recommended by PT    Recommendations for Other Services       Precautions / Restrictions Precautions Precautions: Cervical Precaution Booklet Issued: Yes (comment) Required Braces or Orthoses: Cervical Brace Cervical Brace: Soft collar;At all times Restrictions Weight Bearing Restrictions: No      Mobility  Bed Mobility Overal bed mobility: Modified Independent                  Transfers Overall transfer level: Modified independent Equipment used: None                Ambulation/Gait Ambulation/Gait assistance: Modified independent (Device/Increase time) Gait Distance (Feet): 250 Feet Assistive device: None Gait Pattern/deviations: Step-through pattern;Wide base of support     General Gait Details: Pt ambulated 250' modI. Patient able to make quick turns modI.  Stairs            Wheelchair Mobility    Modified Rankin (Stroke Patients Only)       Balance Overall balance assessment: Modified Independent                                           Pertinent Vitals/Pain Pain Assessment: Faces Faces Pain Scale: Hurts even more Pain Location: L  shoulder Pain Descriptors / Indicators: Grimacing;Guarding Pain Intervention(s): Limited activity within patient's tolerance;Monitored during session;Repositioned    Home Living Family/patient expects to be discharged to:: Private residence Living Arrangements: Children;Parent Available Help at Discharge: Family Type of Home: House Home Access: Level entry     Home Layout: One level Home Equipment: Environmental consultant - 2 wheels;Bedside commode;Wheelchair - manual      Prior Function Level of Independence: Independent               Hand Dominance        Extremity/Trunk Assessment   Upper Extremity Assessment Upper Extremity Assessment: Defer to OT evaluation    Lower Extremity Assessment Lower Extremity Assessment: Overall WFL for tasks assessed       Communication   Communication: No difficulties  Cognition Arousal/Alertness: Awake/alert Behavior During Therapy: WFL for tasks assessed/performed Overall Cognitive Status: Within Functional Limits for tasks assessed                                        General Comments      Exercises     Assessment/Plan    PT Assessment Patent does not need any further PT services  PT Problem List Decreased strength;Decreased activity tolerance;Pain       PT Treatment Interventions Gait training;Functional mobility training;Therapeutic  activities;Therapeutic exercise    PT Goals (Current goals can be found in the Care Plan section)  Acute Rehab PT Goals Patient Stated Goal: to go home PT Goal Formulation: With patient Time For Goal Achievement: 08/10/20 Potential to Achieve Goals: Good    Frequency     Barriers to discharge        Co-evaluation               AM-PAC PT "6 Clicks" Mobility  Outcome Measure Help needed turning from your back to your side while in a flat bed without using bedrails?: None Help needed moving from lying on your back to sitting on the side of a flat bed without using  bedrails?: None Help needed moving to and from a bed to a chair (including a wheelchair)?: None Help needed standing up from a chair using your arms (e.g., wheelchair or bedside chair)?: None Help needed to walk in hospital room?: None Help needed climbing 3-5 steps with a railing? : None 6 Click Score: 24    End of Session Equipment Utilized During Treatment: Cervical collar Activity Tolerance: Patient tolerated treatment well Patient left: in bed;with call bell/phone within reach Nurse Communication: Mobility status PT Visit Diagnosis: Other abnormalities of gait and mobility (R26.89);Pain;Muscle weakness (generalized) (M62.81) Pain - Right/Left: Left (and neck) Pain - part of body: Shoulder    Time: 1435-1445 PT Time Calculation (min) (ACUTE ONLY): 10 min   Charges:   PT Evaluation $PT Eval Low Complexity: 1 Low          Perrin Maltese, PT, DPT Acute Rehabilitation Services Pager [phone] Office [phone]   Jason [name] 08/10/2020, 3:12 PM

## 2020-08-11 ENCOUNTER — Encounter (HOSPITAL_COMMUNITY): Payer: Self-pay | Admitting: Specialist

## 2020-08-11 DIAGNOSIS — M4722 Other spondylosis with radiculopathy, cervical region: Secondary | ICD-10-CM | POA: Diagnosis not present

## 2020-08-11 MED ORDER — METHOCARBAMOL 500 MG PO TABS: 500.0000 mg | ORAL_TABLET | Freq: Four times a day (QID) | ORAL | 1 refills | Status: DC | PRN

## 2020-08-11 MED ORDER — OXYCODONE HCL 10 MG PO TABS: 10.0000 mg | ORAL_TABLET | ORAL | 0 refills | Status: DC | PRN

## 2020-08-11 MED FILL — Thrombin (Recombinant) For Soln 20000 Unit: CUTANEOUS | Qty: 1 | Status: AC

## 2020-08-11 NOTE — Progress Notes (Signed)
Occupational Therapy Evaluation Patient Details Name: Jason [name] MRN: [id number] DOB: 1981-10-09 Today's Date: 08/11/2020    History of Present Illness 39 year old black male history of left C6-7 HNP neck pain and left upper extremity radiculopathy comes in for preop evaluation. Patient s/p L C6-7 foraminotomy on 10/25.   Clinical Impression   Completed education regarding compensatory strategies and use of available  AE/DME to maximize functional level of independence with ADL and functional mobility while adhering to cervical precautions. Pt able to return demonstrate techniques and is overall modified independent with ADL and mobility. Pt will have necessary level of assistance after DC. Pt with minimal LUE weakness and complaints of LUE shoulder pain with movement. Pt may benefit from outpt therapy after follow up with Dr [name] pending progress of LUE. No further OT acute needs.     Follow Up Recommendations  Supervision - Intermittent;No OT follow up;Follow surgeon's recommendation for DC plan and follow-up therapies    Equipment Recommendations  None recommended by OT    Recommendations for Other Services       Precautions / Restrictions Precautions Precautions: Cervical Precaution Booklet Issued: Yes (comment) Required Braces or Orthoses: Cervical Brace Cervical Brace: Soft collar;At all times Restrictions Weight Bearing Restrictions: No      Mobility Bed Mobility Overal bed mobility: Modified Independent             General bed mobility comments: good carry over of log rolling technique    Transfers Overall transfer level: Modified independent                    Balance Overall balance assessment: Modified Independent                                         ADL either performed or assessed with clinical judgement   ADL Overall ADL's : Needs assistance/impaired                                     Functional  mobility during ADLs: Modified independent General ADL Comments: Able to complete figure four positioning for LB ADL. Pt overall set up/S with ADL tasks. Educated on compensatory strategies and use of AE/DME to increase independence and increase adherence with cervical precautions during ADL. Pt able to return demonstrate use of compensatory strategies during session. Recommend pt use his shower chair for bathing. Pt verbalized understanding.     Vision         Perception     Praxis      Pertinent Vitals/Pain Pain Assessment: Faces Faces Pain Scale: Hurts a little bit Pain Location: L shoulder Pain Descriptors / Indicators: Grimacing;Guarding Pain Intervention(s): Limited activity within patient's tolerance     Hand Dominance Right   Extremity/Trunk Assessment Upper Extremity Assessment Upper Extremity Assessment: LUE deficits/detail LUE Deficits / Details: complains of pain LUE - greater wtih shoulder movement. ROM overall WFL; strenght @ 4/5 throughout.Using functionally without difficulty LUE Coordination: decreased gross motor   Lower Extremity Assessment Lower Extremity Assessment: Defer to PT evaluation   Cervical / Trunk Assessment Cervical / Trunk Assessment: Other exceptions (cervical sx)   Communication Communication Communication: No difficulties   Cognition Arousal/Alertness: Awake/alert Behavior During Therapy: WFL for tasks assessed/performed Overall Cognitive Status: Within Functional Limits for tasks assessed  General Comments       Exercises Exercises: Other exercises Other Exercises Other Exercises: encouraged functional use of LUE during activites such as using nuts/bolts; picking up pennies; grip strengthening with squeeze ball.    Shoulder Instructions      Home Living Family/patient expects to be discharged to:: Private residence Living Arrangements: Children;Parent Available Help at  Discharge: Family Type of Home: House Home Access: Level entry     [place]: One level     Bathroom Shower/Tub: Occupational psychologist: [place] Accessibility: Yes How Accessible: Accessible via walker Home Equipment: [place] - 2 wheels;Bedside commode;Wheelchair - manual          Prior Functioning/Environment Level of Independence: Independent        Comments: not currently working        OT Problem List: Decreased knowledge of precautions;Decreased safety awareness;Decreased strength;Impaired UE functional use      OT Treatment/Interventions:      OT Goals(Current goals can be found in the care plan section) Acute Rehab OT Goals Patient Stated Goal: to go home OT Goal Formulation: All assessment and education complete, DC therapy  OT Frequency:     Barriers to D/C:            Co-evaluation              AM-PAC OT "6 Clicks" Daily Activity     Outcome Measure Help from another person eating meals?: None Help from another person taking care of personal grooming?: None Help from another person toileting, which includes using toliet, bedpan, or urinal?: None Help from another person bathing (including washing, rinsing, drying)?: None Help from another person to put on and taking off regular upper body clothing?: None Help from another person to put on and taking off regular lower body clothing?: None 6 Click Score: 24   End of Session Equipment Utilized During Treatment: Cervical collar Nurse Communication: Other (comment) (DC needs)  Activity Tolerance: Patient tolerated treatment well Patient left: in bed;with call bell/phone within reach  OT Visit Diagnosis: Muscle weakness (generalized) (M62.81)pain                Time: 2979-8921 OT Time Calculation (min): 17 min Charges:  OT General Charges $OT Visit: 1 Visit OT Evaluation $OT Eval Low Complexity: [place], OT/L   Acute OT Clinical Specialist Acute  Rehabilitation Services Pager [phone] Office [phone]   [facility] 08/11/2020, 9:56 AM

## 2020-08-11 NOTE — Progress Notes (Signed)
     Subjective: 1 Day Post-Op Procedure(s) (LRB): LEFT C6-7 FORAMINOTOMY (N/A) Awake, alert and oriented x 4. Voiding without difficulty, standing and walking in hallway with minimal assistance. Central left neck pain as appropriate for left posterior neck foramenotomy. Pain left shoulder, likely related to position and use of  Tape to position shoulders lower for radiographic evaluation.   Patient reports pain as moderate.    Objective:   VITALS:  Temp:  [98.2 F (36.8 C)-99.2 F (37.3 C)] 98.2 F (36.8 C) (10/26 0754) Pulse Rate:  [59-92] 59 (10/26 0754) Resp:  [15-20] 16 (10/26 0754) BP: (101-137)/(54-92) 122/81 (10/26 0754) SpO2:  [96 %-100 %] 100 % (10/26 0754)  Neurologically intact ABD soft Neurovascular intact Sensation intact distally Intact pulses distally Dorsiflexion/Plantar flexion intact Incision: dressing C/D/I, no drainage and new dressing applied. No cellulitis present Compartment soft   LABS No results for input(s): HGB, WBC, PLT in the last 72 hours. No results for input(s): NA, K, CL, CO2, BUN, CREATININE, GLUCOSE in the last 72 hours. No results for input(s): LABPT, INR in the last 72 hours.   Assessment/Plan: 1 Day Post-Op Procedure(s) (LRB): LEFT C6-7 FORAMINOTOMY (N/A)  Advance diet Up with therapy Discharge home with home health  Jason [name] 08/11/2020, 7:56 AM Patient ID: Jason [name], male   DOB: 11-30-1980, 39 y.o.   MRN: [id number]

## 2020-08-11 NOTE — Progress Notes (Signed)
Pt doing well. Pt given D/C instructions with verbal understanding. Pt's incision is clean and dry with no sign of infection. Pt's IV was removed prior to D/C. Pt D/C'd home via wheelchair per MD order. Pt is stable @ D/C and has no other needs at this time. Holli Humbles, RN

## 2020-08-13 ENCOUNTER — Ambulatory Visit: Payer: Self-pay

## 2020-08-13 ENCOUNTER — Telehealth: Payer: Self-pay | Admitting: Specialist

## 2020-08-13 ENCOUNTER — Encounter: Payer: Self-pay | Admitting: Surgery

## 2020-08-13 ENCOUNTER — Ambulatory Visit (INDEPENDENT_AMBULATORY_CARE_PROVIDER_SITE_OTHER): Payer: Managed Care, Other (non HMO) | Admitting: Surgery

## 2020-08-13 VITALS — BP 103/69 | HR 85 | Ht 68.0 in | Wt 151.9 lb

## 2020-08-13 DIAGNOSIS — M25512 Pain in left shoulder: Secondary | ICD-10-CM

## 2020-08-13 MED ORDER — LIDOCAINE HCL 1 % IJ SOLN
3.0000 mL | INTRAMUSCULAR | Status: AC | PRN
  Administered 2020-08-13: 3 mL

## 2020-08-13 NOTE — Telephone Encounter (Signed)
Patient's mom called. Says the pain medication is not working. He is in a lot of pain. She would like a call back. [phone]. Mom says she is keeping a sheet on the arm and keeping it elevated.

## 2020-08-13 NOTE — Telephone Encounter (Signed)
Patients mother came by wanting to discuss some issues that he is having, however I made him an appt to be seen as the patient needs to be evaluated if he is having issues. He is scheduled to come in today @ 1:30

## 2020-08-13 NOTE — Progress Notes (Signed)
Office Visit Note   Patient: Jason [name]           Date of Birth: 07/12/1981           MRN: [id number] Visit Date: 08/13/2020              Requested by: Glendale Chard, [address] [place],  [place] [address] PCP: Glendale Chard, MD   Assessment & Plan: Visit Diagnoses:  1. Acute pain of left shoulder     Plan: Advised patient that I think that is pain is related to impingement symptoms of the left shoulder.  In hopes to give him relief offered injection.  After patient consent left shoulder was prepped with Betadine and subacromial Marcaine/Depo-Medrol injection was performed from a posterior approach.  After sitting for a couple of minutes patient had excellent relief with anesthetic in place.  He will use ice on the shoulder as needed today.  He will follow-up with Dr. [name] at his regular scheduled postop appointment.  All questions answered.  Continue cervical collar.  Follow-Up Instructions: Return for Dr. [name] November 5 as scheduled.   Orders:  Orders Placed This Encounter  Procedures  . Large Joint Inj  . XR Shoulder Left   No orders of the defined types were placed in this encounter.     Procedures: Large Joint Inj: L subacromial bursa on 08/13/2020 2:22 PM Indications: pain Details: 25 G 1.5 in needle, posterior approach Medications: 3 mL lidocaine 1 % Outcome: tolerated well, no immediate complications  After sitting for a couple of minutes patient reported excellent relief of his left shoulder pain with anesthetic in place and he had full shoulder range of motion. Consent was given by the patient. Patient was prepped and draped in the usual sterile fashion.       Clinical Data: No additional findings.   Subjective: Chief Complaint  Patient presents with  . Neck - Routine Post Op    HPI 39 year old black male who is status post left C6-7 foraminotomy this past Monday comes in today with complaints of increased left shoulder  pain.  No shoulder injury.  Left shoulder pain with overactivity and reaching behind his back.  States that the pain can be constant at times but worse with movement.  He did not have this particular pain before surgery.  Left arm radicular symptoms are improved.  Objective: Vital Signs: BP 103/69   Pulse 85   Ht 5\' 8"  (1.727 m)   Wt 151 lb 14.4 oz (68.9 kg)   BMI 23.10 kg/m   Physical Exam Pleasant black male alert and oriented in no acute distress.  He has considerable amount of pain with left shoulder flexion/abduction to about 90 degrees.  Limited internal rotation behind his back due to pain.  Negative drop arm.  Markedly positive impingement test.  Right shoulder unremarkable.  He is neurologically intact. Ortho Exam  Specialty Comments:  Patient needs to verify address.  Imaging: No results found.   PMFS History: Patient Active Problem List   Diagnosis Date Noted  . Other spondylosis with radiculopathy, cervical region 08/10/2020    Class: Chronic  . Cervical spondylosis 08/10/2020  . Cervical post-laminectomy syndrome 08/10/2020  . Sinus bradycardia 07/14/2020  . MDD (major depressive disorder), recurrent severe, without psychosis ([facility]) 12/10/2019  . Polysubstance dependence including opioid type drug, episodic abuse ([facility]) 12/09/2019  . Substance induced mood disorder ([facility]) 12/09/2019  . MDD (major depressive disorder), severe ([facility]) 12/09/2019  . Duodenitis   .  Community acquired pneumonia 11/10/2019  . Nausea vomiting and diarrhea 11/10/2019  . Diarrhea 11/10/2019  . History of pulmonary embolism 11/10/2019  . AKI (acute kidney injury) ([facility]) 11/10/2019  . Sepsis ([facility]) 11/09/2019  . 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 Uncomplicated opioid dependence ([facility]) 01/08/2019  . Cervicalgia 12/13/2018  . Acute low back pain with bilateral sciatica 12/13/2018  . Chronic pain due to trauma 12/13/2018  . Cervical radiculopathy 12/13/2018  . Cocaine abuse, episodic ([facility])  12/11/2018  . Thrombocytosis 12/05/2018  . Lactic acidosis 12/04/2018  . Pulmonary embolism ([facility]) 11/27/2018  . S/P lumbar laminectomy 08/29/2017  . Spinal stenosis of lumbar region 08/28/2017    Class: Chronic  . Status post lumbar microdiscectomy 08/28/2017  . 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 Gross hematuria 04/01/2016  . Depression 04/01/2016  . Chronic low back pain 03/29/2016  . Cough 08/05/2015  . Preoperative cardiovascular examination 02/17/2015  . Lumbar disc disease   . Cervical disc disease   . GERD (gastroesophageal reflux disease)   . Anxiety   . Insomnia   . CONSTIPATION 10/05/2010  . MIGRAINE, COMMON 09/23/2010  . Abdominal pain, other specified site 09/23/2010   Past Medical History:  Diagnosis Date  . Anxiety   . Back injury   . Cervical disc disease   . Chronic back pain   . Chronic low back pain 03/29/2016  . GERD (gastroesophageal reflux disease)   . History of kidney stones    per mother- "there was a diagnoses at some point"  . Insomnia   . Lumbar disc disease   . MIGRAINE, COMMON 09/23/2010   Qualifier: Diagnosis of  By: Jenny Reichmann MD, Hunt Oris   . Pneumonia   . Pulmonary embolism ([facility]) 11/2018  . Sickle cell trait ([facility])     Family History  Problem Relation Age of Onset  . Congestive Heart Failure Father   . Hypertension Mother   . Hyperlipidemia Mother   . Stroke Mother     Past Surgical History:  Procedure Laterality Date  . BACK SURGERY    . BIOPSY  11/12/2019   Procedure: BIOPSY;  Surgeon: [facility], MD;  Location: [facility];  Service: Gastroenterology;;  . ESOPHAGOGASTRODUODENOSCOPY (EGD) WITH PROPOFOL N/A 11/12/2019   Procedure: ESOPHAGOGASTRODUODENOSCOPY (EGD) WITH PROPOFOL;  Surgeon: [facility], MD;  Location: [facility];  Service: Gastroenterology;  Laterality: N/A;  . LUMBAR LAMINECTOMY/DECOMPRESSION MICRODISCECTOMY Bilateral 08/28/2017    Procedure: BILATERAL PARTIAL HEMILAMINECTOMIES L4-5 WITH MICRODISCECTOMY;  Surgeon: Jessy Oto, MD;  Location: [facility];  Service: Orthopedics;  Laterality: Bilateral;  . POSTERIOR CERVICAL FUSION/FORAMINOTOMY N/A 08/10/2020   Procedure: LEFT C6-7 FORAMINOTOMY;  Surgeon: Jessy Oto, MD;  Location: [facility];  Service: Orthopedics;  Laterality: N/A;   Social History   Occupational History  . Not on file  Tobacco Use  . Smoking status: Current Every Day Smoker    Packs/day: 1.00    Years: 24.00    Pack years: 24.00    Types: Cigarettes  . Smokeless tobacco: Never Used  . Tobacco comment: notified primary care provider and CCM RNCM  Vaping Use  . Vaping Use: Some days  Substance and Sexual Activity  . Alcohol use: Not Currently    Comment: former use - heavy drinker 8 years ago  . Drug use: No  . Sexual activity: Yes

## 2020-08-13 NOTE — Discharge Summary (Signed)
Patient ID: Jason [name] MRN: [id number] DOB/AGE: [birth year] 39 y.o.  Admit date: 08/10/2020 Discharge date: 08/11/2020 Admission Diagnoses:  Principal Problem:   Other spondylosis with radiculopathy, cervical region Active Problems:   Cervical spondylosis   Cervical post-laminectomy syndrome   Discharge Diagnoses:  Principal Problem:   Other spondylosis with radiculopathy, cervical region Active Problems:   Cervical spondylosis   Cervical post-laminectomy syndrome  status post Procedure(s): LEFT C6-7 FORAMINOTOMY  Past Medical History:  Diagnosis Date  . Anxiety   . Back injury   . Cervical disc disease   . Chronic back pain   . Chronic low back pain 03/29/2016  . GERD (gastroesophageal reflux disease)   . History of kidney stones    per mother- "there was a diagnoses at some point"  . Insomnia   . Lumbar disc disease   . MIGRAINE, COMMON 09/23/2010   Qualifier: Diagnosis of  By: Jenny Reichmann MD, Hunt Oris   . Pneumonia   . Pulmonary embolism ([facility]) 11/2018  . Sickle cell trait ([facility])     Surgeries: Procedure(s): LEFT C6-7 FORAMINOTOMY on 08/10/2020   Consultants:   Discharged Condition: Improved  Hospital Course: Jason [name] is an 39 y.o. male who was admitted 08/10/2020 for operative treatment of Other spondylosis with radiculopathy, cervical region. Patient failed conservative treatments (please see the history and physical for the specifics) and had severe unremitting pain that affects sleep, daily activities and work/hobbies. After pre-op clearance, the patient was taken to the operating room on 08/10/2020 and underwent  Procedure(s): LEFT C6-7 FORAMINOTOMY.    Patient was given perioperative antibiotics:  Anti-infectives (From admission, onward)   Start     Dose/Rate Route Frequency Ordered Stop   08/10/20 1600  ceFAZolin (ANCEF) IVPB 1 g/50 mL premix        1 g 100 mL/hr over 30 Minutes Intravenous Every 8 hours 08/10/20 1109 08/10/20 2353   08/10/20  0600  ceFAZolin (ANCEF) IVPB 2g/100 mL premix        2 g 200 mL/hr over 30 Minutes Intravenous On call to O.R. 08/10/20 8299 08/10/20 0750       Patient was given sequential compression devices and early ambulation to prevent DVT.   Patient benefited maximally from hospital stay and there were no complications. At the time of discharge, the patient was urinating/moving their bowels without difficulty, tolerating a regular diet, pain is controlled with oral pain medications and they have been cleared by PT/OT.   Recent vital signs: No data found.   Recent laboratory studies: No results for input(s): WBC, HGB, HCT, PLT, NA, K, CL, CO2, BUN, CREATININE, GLUCOSE, INR, CALCIUM in the last 72 hours.  Invalid input(s): PT, 2   Discharge Medications:   Allergies as of 08/11/2020      Reactions   Morphine And Related Swelling   Other Hives, Swelling   Antibiotic that was given when he had pneumonia       Medication List    TAKE these medications   BC FAST PAIN RELIEF PO Take 1-2 packets by mouth daily as needed (headache).   Clear Eyes for Dry Eyes 1-0.25 % Soln Generic drug: Carboxymethylcellul-Glycerin Place 1 drop into the left eye daily as needed (dry eye).   gabapentin 300 MG capsule Commonly known as: NEURONTIN Take 300 mg by mouth 3 (three) times daily.   methocarbamol 500 MG tablet Commonly known as: ROBAXIN Take 1 tablet (500 mg total) by mouth every 6 (six) hours as needed for muscle spasms.  Oxycodone HCl 20 MG Tabs Take 20 mg by mouth in the morning, at noon, and at bedtime. What changed: Another medication with the same name was added. Make sure you understand how and when to take each.   Oxycodone HCl 10 MG Tabs Take 1 tablet (10 mg total) by mouth every 4 (four) hours as needed for severe pain or breakthrough pain ((score 7 to 10)). What changed: You were already taking a medication with the same name, and this prescription was added. Make sure you understand  how and when to take each.   rivaroxaban 20 MG Tabs tablet Commonly known as: XARELTO Take 20 mg by mouth daily with supper.       Diagnostic Studies: DG Cervical Spine 1 View  Result Date: 08/10/2020 CLINICAL DATA:  Intraoperative localization for surgery EXAM: DG C-ARM 1-60 MIN; DG CERVICAL SPINE - 1 VIEW COMPARISON:  December 03, 2018 FINDINGS: Cross-table lateral images obtained with second image time stamped 8:44:32. Metallic probe tip is posterior to the C6 spinous process. No evident fracture or spondylolisthesis. Visualized disc spaces appear unremarkable. IMPRESSION: Metallic probe tip is posterior to the C6 spinous process. No fracture or spondylolisthesis. Electronically Signed   By: Lowella Grip III M.D.   On: 08/10/2020 10:37   DG C-Arm 1-60 Min  Result Date: 08/10/2020 CLINICAL DATA:  Intraoperative localization for surgery EXAM: DG C-ARM 1-60 MIN; DG CERVICAL SPINE - 1 VIEW COMPARISON:  December 03, 2018 FINDINGS: Cross-table lateral images obtained with second image time stamped 8:44:32. Metallic probe tip is posterior to the C6 spinous process. No evident fracture or spondylolisthesis. Visualized disc spaces appear unremarkable. IMPRESSION: Metallic probe tip is posterior to the C6 spinous process. No fracture or spondylolisthesis. Electronically Signed   By: Lowella Grip III M.D.   On: 08/10/2020 10:37    Discharge Instructions    Call MD / Call 911   Complete by: As directed    If you experience chest pain or shortness of breath, CALL 911 and be transported to the hospital emergency room.  If you develope a fever above 101 F, pus (white drainage) or increased drainage or redness at the wound, or calf pain, call your surgeon's office.   Constipation Prevention   Complete by: As directed    Drink plenty of fluids.  Prune juice may be helpful.  You may use a stool softener, such as Colace (over the counter) 100 mg twice a day.  Use MiraLax (over the counter) for  constipation as needed.   Diet - low sodium heart healthy   Complete by: As directed    Discharge instructions   Complete by: As directed    No lifting greater than 10 lbs. Avoid bending, stooping and twisting. Walking in house for first week then may start to get out slowly increasing activity using arms. Keep incision dry for 3 days, may use tegaderm or similar water impervious dressing. Avoid overhead use of arms and overhead lifting. Wear collar for comfort. Use ice as needed for comfort. Restart xarelto the evening of 08/11/2020.   Driving restrictions   Complete by: As directed    No driving for 2 weeks   Increase activity slowly as tolerated   Complete by: As directed    Lifting restrictions   Complete by: As directed    No lifting for 6 weeks       Follow-up Information    Jessy Oto, MD In 2 weeks.   Specialty: Orthopedic Surgery Why: For  wound re-check Contact information: [place] [address] [phone]               Discharge Plan:  discharge to home  Disposition:     Signed: Benjiman Core  08/13/2020, 12:01 PM

## 2020-08-21 ENCOUNTER — Encounter: Payer: Self-pay | Admitting: Specialist

## 2020-08-21 ENCOUNTER — Ambulatory Visit (INDEPENDENT_AMBULATORY_CARE_PROVIDER_SITE_OTHER): Payer: Managed Care, Other (non HMO) | Admitting: Specialist

## 2020-08-21 ENCOUNTER — Other Ambulatory Visit: Payer: Self-pay

## 2020-08-21 ENCOUNTER — Ambulatory Visit (INDEPENDENT_AMBULATORY_CARE_PROVIDER_SITE_OTHER): Payer: Managed Care, Other (non HMO)

## 2020-08-21 VITALS — BP 118/83 | HR 114 | Ht 68.0 in | Wt 152.0 lb

## 2020-08-21 DIAGNOSIS — M7502 Adhesive capsulitis of left shoulder: Secondary | ICD-10-CM | POA: Diagnosis not present

## 2020-08-21 DIAGNOSIS — M25512 Pain in left shoulder: Secondary | ICD-10-CM | POA: Diagnosis not present

## 2020-08-21 DIAGNOSIS — M5010 Cervical disc disorder with radiculopathy, unspecified cervical region: Secondary | ICD-10-CM

## 2020-08-21 MED ORDER — OXYCODONE HCL 5 MG PO CAPS
5.0000 mg | ORAL_CAPSULE | ORAL | 0 refills | Status: DC | PRN
Start: 2020-08-21 — End: 2020-08-21

## 2020-08-21 MED ORDER — OXYCODONE HCL 5 MG PO CAPS
5.0000 mg | ORAL_CAPSULE | ORAL | 0 refills | Status: AC | PRN
Start: 2020-08-21 — End: 2020-08-28

## 2020-08-21 MED ORDER — METHOCARBAMOL 500 MG PO TABS: 500.0000 mg | ORAL_TABLET | Freq: Three times a day (TID) | ORAL | 1 refills | Status: DC | PRN

## 2020-08-21 MED ORDER — RIVAROXABAN 20 MG PO TABS: 20.0000 mg | ORAL_TABLET | Freq: Every day | ORAL | 3 refills | Status: DC

## 2020-08-21 NOTE — Patient Instructions (Addendum)
Avoid overhead lifting and overhead use of the arms. Pillows to keep from sleeping directly on the shoulders Limited lifting to less than 10 lbs. Ice or heat for relief. NSAIDs are helpful, such as alleve or motrin, be careful not to use in excess as they place burdens on the kidney. Stretching exercise help and strengthening is helpful to build endurance. Muscle relaxer and oxy IR 5 mg every 4 hours for chronic pain. PT for left shoulder ROM, stretching and strengthening.

## 2020-08-21 NOTE — Progress Notes (Signed)
Post-Op Visit Note   Patient: Jason [name]           Date of Birth: 08/19/1981           MRN: [id number] Visit Date: 08/21/2020 PCP: Glendale Chard, MD   Assessment & Plan: 11 days post op left C6-7 foraminotomy (C7)  Chief Complaint:  Chief Complaint  Patient presents with  . Neck - Routine Post Op  Awake, alert and oriented x 4. Motor is normal left arm Complains of some pain left shoulder and left anterior pectoralis m. Pulses are normal  Incision is healed, staples removed. Plain radiographs   Left shoulder with painful ROM positive impingment test. Given a left shoulder SAS injection by Jeneen Rinks one week ago with good relief. Visit Diagnoses: No diagnosis found.  Plan: Avoid overhead lifting and overhead use of the arms. Do not lift greater than 5-10 lbs. Adjust head rest in vehicle to prevent hyperextension if rear ended. Take extra precautions to avoid falls.   Follow-Up Instructions: No follow-ups on file.   Orders:  No orders of the defined types were placed in this encounter.  No orders of the defined types were placed in this encounter.   Imaging: No results found.  PMFS History: Patient Active Problem List   Diagnosis Date Noted  . Other spondylosis with radiculopathy, cervical region 08/10/2020    Priority: High    Class: Chronic  . Spinal stenosis of lumbar region 08/28/2017    Priority: High    Class: Chronic  . 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 Cervical spondylosis 08/10/2020  . Cervical post-laminectomy syndrome 08/10/2020  . Sinus bradycardia 07/14/2020  . MDD (major depressive disorder), recurrent severe, without psychosis ([facility]) 12/10/2019  . Polysubstance dependence including opioid type drug, episodic abuse ([facility]) 12/09/2019  . Substance induced mood disorder ([facility]) 12/09/2019  . MDD (major depressive disorder), severe ([facility]) 12/09/2019  .  Duodenitis   . Community acquired pneumonia 11/10/2019  . Nausea vomiting and diarrhea 11/10/2019  . Diarrhea 11/10/2019  . History of pulmonary embolism 11/10/2019  . AKI (acute kidney injury) ([facility]) 11/10/2019  . Sepsis ([facility]) 11/09/2019  . 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 Uncomplicated opioid dependence ([facility]) 01/08/2019  . Cervicalgia 12/13/2018  . Acute low back pain with bilateral sciatica 12/13/2018  . Chronic pain due to trauma 12/13/2018  . Cervical radiculopathy 12/13/2018  . Cocaine abuse, episodic ([facility]) 12/11/2018  . Thrombocytosis 12/05/2018  . Lactic acidosis 12/04/2018  . Pulmonary embolism ([facility]) 11/27/2018  . S/P lumbar laminectomy 08/29/2017  . Status post lumbar microdiscectomy 08/28/2017  . Gross hematuria 04/01/2016  . Depression 04/01/2016  . Chronic low back pain 03/29/2016  . Cough 08/05/2015  . Preoperative cardiovascular examination 02/17/2015  . Lumbar disc disease   . Cervical disc disease   . GERD (gastroesophageal reflux disease)   . Anxiety   . Insomnia   . CONSTIPATION 10/05/2010  . MIGRAINE, COMMON 09/23/2010  . Abdominal pain, other specified site 09/23/2010   Past Medical History:  Diagnosis Date  . Anxiety   . Back injury   . Cervical disc disease   . Chronic back pain   . Chronic low back pain 03/29/2016  . GERD (gastroesophageal reflux disease)   . History of kidney stones    per mother- "there was a diagnoses at some point"  . Insomnia   . Lumbar disc disease   .  MIGRAINE, COMMON 09/23/2010   Qualifier: Diagnosis of  By: Jenny Reichmann MD, Hunt Oris   . Pneumonia   . Pulmonary embolism ([facility]) 11/2018  . Sickle cell trait ([facility])     Family History  Problem Relation Age of Onset  . Congestive Heart Failure Father   . Hypertension Mother   . Hyperlipidemia Mother   . Stroke Mother     Past Surgical History:  Procedure Laterality Date  . BACK SURGERY    . BIOPSY  11/12/2019   Procedure: BIOPSY;  Surgeon: [facility], MD;   Location: [facility];  Service: Gastroenterology;;  . ESOPHAGOGASTRODUODENOSCOPY (EGD) WITH PROPOFOL N/A 11/12/2019   Procedure: ESOPHAGOGASTRODUODENOSCOPY (EGD) WITH PROPOFOL;  Surgeon: [facility], MD;  Location: [facility];  Service: Gastroenterology;  Laterality: N/A;  . LUMBAR LAMINECTOMY/DECOMPRESSION MICRODISCECTOMY Bilateral 08/28/2017   Procedure: BILATERAL PARTIAL HEMILAMINECTOMIES L4-5 WITH MICRODISCECTOMY;  Surgeon: Jessy Oto, MD;  Location: [facility];  Service: Orthopedics;  Laterality: Bilateral;  . POSTERIOR CERVICAL FUSION/FORAMINOTOMY N/A 08/10/2020   Procedure: LEFT C6-7 FORAMINOTOMY;  Surgeon: Jessy Oto, MD;  Location: [facility];  Service: Orthopedics;  Laterality: N/A;   Social History   Occupational History  . Not on file  Tobacco Use  . Smoking status: Current Every Day Smoker    Packs/day: 1.00    Years: 24.00    Pack years: 24.00    Types: Cigarettes  . Smokeless tobacco: Never Used  . Tobacco comment: notified primary care provider and CCM RNCM  Vaping Use  . Vaping Use: Some days  Substance and Sexual Activity  . Alcohol use: Not Currently    Comment: former use - heavy drinker 8 years ago  . Drug use: No  . Sexual activity: Yes

## 2020-08-21 NOTE — Addendum Note (Signed)
Addended by: [name] on: 08/21/2020 01:24 PM   Modules accepted: Orders

## 2020-09-01 ENCOUNTER — Other Ambulatory Visit: Payer: Self-pay | Admitting: Specialist

## 2020-09-01 NOTE — Telephone Encounter (Signed)
Patient request medication for pain or sleeping aide pills. Patient's mother states patient is unable to sleep at night due to pain. [facility] call patient or patient's mother Katha Hamming about this matter. Please send medication to pharmacy on file. Patient phone number is [phone].

## 2020-09-01 NOTE — Telephone Encounter (Signed)
Sent refill request to Dr. [name] to review

## 2020-09-02 MED ORDER — OXYCODONE HCL 5 MG PO CAPS
5.0000 mg | ORAL_CAPSULE | ORAL | 0 refills | Status: DC | PRN
Start: 2020-09-02 — End: 2020-09-09

## 2020-09-02 NOTE — Telephone Encounter (Signed)
Done

## 2020-09-09 ENCOUNTER — Telehealth: Payer: Self-pay | Admitting: Specialist

## 2020-09-09 ENCOUNTER — Other Ambulatory Visit: Payer: Self-pay | Admitting: Specialist

## 2020-09-09 MED ORDER — ONDANSETRON HCL 4 MG PO TABS: 4.0000 mg | ORAL_TABLET | Freq: Three times a day (TID) | ORAL | 0 refills | Status: DC | PRN

## 2020-09-09 MED ORDER — OXYCODONE HCL 5 MG PO CAPS: 5.0000 mg | ORAL_CAPSULE | ORAL | 0 refills | Status: DC | PRN

## 2020-09-09 NOTE — Telephone Encounter (Signed)
Tried to call vm is full, I called his mom and lmom advising the change of meds, per Dr. [name] he will send in Tylox to his pharmacy

## 2020-09-09 NOTE — Telephone Encounter (Signed)
I called and advised patient that we sent in tramadol for him.

## 2020-09-09 NOTE — Telephone Encounter (Signed)
Patient's mother Winifred call advised patient need Rx refilled Oxycodone 10mg . She advised the patient is nauseated and can not keep anything down.  The number to contact Winifred is [phone]

## 2020-09-09 NOTE — Telephone Encounter (Signed)
Pt called stating he is allergic to tramadol so he will need something else sent in since he'll break out in welps.  [phone]

## 2020-09-16 ENCOUNTER — Ambulatory Visit: Payer: Managed Care, Other (non HMO) | Admitting: Internal Medicine

## 2020-09-18 ENCOUNTER — Encounter: Payer: Self-pay | Admitting: Specialist

## 2020-09-18 ENCOUNTER — Ambulatory Visit (INDEPENDENT_AMBULATORY_CARE_PROVIDER_SITE_OTHER): Payer: Managed Care, Other (non HMO) | Admitting: Specialist

## 2020-09-18 ENCOUNTER — Ambulatory Visit (INDEPENDENT_AMBULATORY_CARE_PROVIDER_SITE_OTHER): Payer: Managed Care, Other (non HMO)

## 2020-09-18 ENCOUNTER — Other Ambulatory Visit: Payer: Self-pay

## 2020-09-18 VITALS — BP 139/93 | HR 83 | Ht 68.0 in | Wt 152.0 lb

## 2020-09-18 DIAGNOSIS — G8929 Other chronic pain: Secondary | ICD-10-CM | POA: Diagnosis not present

## 2020-09-18 DIAGNOSIS — M25512 Pain in left shoulder: Secondary | ICD-10-CM

## 2020-09-18 MED ORDER — OXYCODONE HCL 5 MG PO CAPS: 5.0000 mg | ORAL_CAPSULE | ORAL | 0 refills | Status: DC | PRN

## 2020-09-18 NOTE — Progress Notes (Signed)
Post-Op Visit Note   Patient: Jason [name]           Date of Birth: 11-17-1980           MRN: [id number] Visit Date: 09/18/2020 PCP: Glendale Chard, MD   Assessment & Plan:5 1/2 weeks post op left C6-7 foraminotomy  Chief Complaint:  Chief Complaint  Patient presents with  . Neck - Routine Post Op   Visit Diagnoses:  1. Chronic left shoulder pain   Complains of left anterior shoulder pain deep to mid lateral pectoralis tendon. Strength is better and pain is improved left arm and Hand. Motor left triceps, left wrist volar flexion and finger extension is 5/5. Pain with ER  And abduction of the left shoulder.  Plan: Avoid overhead lifting and overhead use of the arms. Do not lift greater than 5 lbs. Adjust head rest in vehicle to prevent hyperextension if rear ended. Take extra precautions to avoid falling. Avoid overhead lifting and overhead use of the arms. Pillows to keep from sleeping directly on the shoulders Limited lifting to less than 10 lbs. Ice or heat for relief. You can not take NSAIDs are usually helpful, such as alleve or motrin due to use of xarelto. Stretching exercise help and strengthening is helpful to build endurance. Will place in physical therapy for exercises of the left shoulder and left neck.  Follow-Up Instructions: No follow-ups on file.   Orders:  Orders Placed This Encounter  Procedures  . XR Clavicle Left   No orders of the defined types were placed in this encounter.   Imaging: No results found.  PMFS History: Patient Active Problem List   Diagnosis Date Noted  . Other spondylosis with radiculopathy, cervical region 08/10/2020    Priority: High    Class: Chronic  . Spinal stenosis of lumbar region 08/28/2017    Priority: High    Class: Chronic  . 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 Cervical spondylosis 08/10/2020  . Cervical  post-laminectomy syndrome 08/10/2020  . Sinus bradycardia 07/14/2020  . MDD (major depressive disorder), recurrent severe, without psychosis ([place]) 12/10/2019  . Polysubstance dependence including opioid type drug, episodic abuse ([place]) 12/09/2019  . Substance induced mood disorder ([place]) 12/09/2019  . MDD (major depressive disorder), severe ([place]) 12/09/2019  . Duodenitis   . Community acquired pneumonia 11/10/2019  . Nausea vomiting and diarrhea 11/10/2019  . Diarrhea 11/10/2019  . History of pulmonary embolism 11/10/2019  . AKI (acute kidney injury) ([place]) 11/10/2019  . Sepsis ([place]) 11/09/2019  . 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 Uncomplicated opioid dependence ([place]) 01/08/2019  . Cervicalgia 12/13/2018  . Acute low back pain with bilateral sciatica 12/13/2018  . Chronic pain due to trauma 12/13/2018  . Cervical radiculopathy 12/13/2018  . Cocaine abuse, episodic ([place]) 12/11/2018  . Thrombocytosis 12/05/2018  . Lactic acidosis 12/04/2018  . Pulmonary embolism ([place]) 11/27/2018  . S/P lumbar laminectomy 08/29/2017  . Status post lumbar microdiscectomy 08/28/2017  . Gross hematuria 04/01/2016  . Depression 04/01/2016  . Chronic low back pain 03/29/2016  . Cough 08/05/2015  . Preoperative cardiovascular examination 02/17/2015  . Lumbar disc disease   . Cervical disc disease   . GERD (gastroesophageal reflux disease)   . Anxiety   . Insomnia   . CONSTIPATION 10/05/2010  . MIGRAINE, COMMON 09/23/2010  . Abdominal pain, other specified site 09/23/2010   Past Medical History:  Diagnosis  Date  . Anxiety   . Back injury   . Cervical disc disease   . Chronic back pain   . Chronic low back pain 03/29/2016  . GERD (gastroesophageal reflux disease)   . History of kidney stones    per mother- "there was a diagnoses at some point"  . Insomnia   . Lumbar disc disease   . MIGRAINE, COMMON 09/23/2010   Qualifier: Diagnosis of  By: Jenny Reichmann MD, Hunt Oris   . Pneumonia   . Pulmonary  embolism ([place]) 11/2018  . Sickle cell trait (HCC)     Family History  Problem Relation Age of Onset  . Congestive Heart Failure Father   . Hypertension Mother   . Hyperlipidemia Mother   . Stroke Mother     Past Surgical History:  Procedure Laterality Date  . BACK SURGERY    . BIOPSY  11/12/2019   Procedure: BIOPSY;  Surgeon: Thornton Park, MD;  Location: [facility];  Service: Gastroenterology;;  . ESOPHAGOGASTRODUODENOSCOPY (EGD) WITH PROPOFOL N/A 11/12/2019   Procedure: ESOPHAGOGASTRODUODENOSCOPY (EGD) WITH PROPOFOL;  Surgeon: Thornton Park, MD;  Location: [facility];  Service: Gastroenterology;  Laterality: N/A;  . LUMBAR LAMINECTOMY/DECOMPRESSION MICRODISCECTOMY Bilateral 08/28/2017   Procedure: BILATERAL PARTIAL HEMILAMINECTOMIES L4-5 WITH MICRODISCECTOMY;  Surgeon: Jessy Oto, MD;  Location: [facility];  Service: Orthopedics;  Laterality: Bilateral;  . POSTERIOR CERVICAL FUSION/FORAMINOTOMY N/A 08/10/2020   Procedure: LEFT C6-7 FORAMINOTOMY;  Surgeon: Jessy Oto, MD;  Location: [facility];  Service: Orthopedics;  Laterality: N/A;   Social History   Occupational History  . Not on file  Tobacco Use  . Smoking status: Current Every Day Smoker    Packs/day: 1.00    Years: 24.00    Pack years: 24.00    Types: Cigarettes  . Smokeless tobacco: Never Used  . Tobacco comment: notified primary care provider and CCM RNCM  Vaping Use  . Vaping Use: Some days  Substance and Sexual Activity  . Alcohol use: Not Currently    Comment: former use - heavy drinker 8 years ago  . Drug use: No  . Sexual activity: Yes

## 2020-09-18 NOTE — Patient Instructions (Addendum)
Avoid overhead lifting and overhead use of the arms. Pillows to keep from sleeping directly on the shoulders Limited lifting to less than 10 lbs. Ice or heat for relief. You can not take NSAIDs are usually helpful, such as alleve or motrin due to use of xarelto. Stretching exercise help and strengthening is helpful to build endurance. Will place in physical therapy for exercises of the left shoulder and left neck.

## 2020-10-08 ENCOUNTER — Other Ambulatory Visit: Payer: Self-pay | Admitting: Specialist

## 2020-10-08 ENCOUNTER — Ambulatory Visit: Payer: Managed Care, Other (non HMO) | Admitting: Surgery

## 2020-10-08 MED ORDER — OXYCODONE HCL 5 MG PO CAPS
5.0000 mg | ORAL_CAPSULE | ORAL | 0 refills | Status: DC | PRN
Start: 2020-10-08 — End: 2020-10-28

## 2020-10-08 NOTE — Telephone Encounter (Signed)
Pt called and needs a refill on oxycodone.  

## 2020-10-08 NOTE — Addendum Note (Signed)
Addended by: Minda Ditto, Geoffery Spruce on: 10/08/2020 03:17 PM   Modules accepted: Orders

## 2020-10-22 ENCOUNTER — Other Ambulatory Visit: Payer: Self-pay | Admitting: Specialist

## 2020-10-22 NOTE — Telephone Encounter (Signed)
Patient called. He would like a refill on oxycodone. His call back number is [phone]

## 2020-10-28 ENCOUNTER — Other Ambulatory Visit: Payer: Self-pay

## 2020-10-28 ENCOUNTER — Encounter: Payer: Self-pay | Admitting: Specialist

## 2020-10-28 ENCOUNTER — Ambulatory Visit (INDEPENDENT_AMBULATORY_CARE_PROVIDER_SITE_OTHER): Payer: Commercial Managed Care - HMO | Admitting: Specialist

## 2020-10-28 VITALS — BP 146/79 | HR 106 | Ht 68.0 in | Wt 152.0 lb

## 2020-10-28 DIAGNOSIS — G8929 Other chronic pain: Secondary | ICD-10-CM

## 2020-10-28 DIAGNOSIS — M5136 Other intervertebral disc degeneration, lumbar region: Secondary | ICD-10-CM

## 2020-10-28 DIAGNOSIS — M47816 Spondylosis without myelopathy or radiculopathy, lumbar region: Secondary | ICD-10-CM

## 2020-10-28 DIAGNOSIS — M5010 Cervical disc disorder with radiculopathy, unspecified cervical region: Secondary | ICD-10-CM

## 2020-10-28 DIAGNOSIS — M25512 Pain in left shoulder: Secondary | ICD-10-CM

## 2020-10-28 DIAGNOSIS — M7502 Adhesive capsulitis of left shoulder: Secondary | ICD-10-CM

## 2020-10-28 MED ORDER — OXYCODONE HCL 5 MG PO CAPS: 5.0000 mg | ORAL_CAPSULE | ORAL | 0 refills | Status: DC | PRN

## 2020-10-28 NOTE — Patient Instructions (Signed)
Plan: Avoid overhead lifting and overhead use of the arms. Do not lift greater than 5 lbs. Adjust head rest in vehicle to prevent hyperextension if rear ended. Take extra precautions to avoid falling.Avoid frequent bending and stooping  No lifting greater than 10 lbs. May use ice or moist heat for pain. Weight loss is of benefit. Best medication for lumbar disc disease is arthritis medications but due to use of xarelto and GERD and low level renal dysfunction you should avoid the use of arthritis medications like motrin, naprosyn and diclofenac Oxycodone is renewed and you should contact pain management to restart appointments for treatment of chronic back pain.  Exercise is important to improve your indurance and does allow people to function better inspite of back pai

## 2020-10-28 NOTE — Progress Notes (Signed)
Post-Op Visit Note   Patient: Jason [name]           Date of Birth: 01-10-1981           MRN: [id number] Visit Date: 10/28/2020 PCP: Glendale Chard, MD   Assessment & Plan: 10 weeks left C6-7 foraminotomy for spondylosis  Chief Complaint: No chief complaint on file. left arm motor is normal. Incision is healed.   Visit Diagnoses:  1. Spondylosis without myelopathy or radiculopathy, lumbar region   2. Cervical disc disorder with radiculopathy, unspecified cervical region   3. Chronic left shoulder pain   4. Adhesive capsulitis of left shoulder   5. Degenerative disc disease, lumbar     Plan: Avoid overhead lifting and overhead use of the arms. Do not lift greater than 5 lbs. Adjust head rest in vehicle to prevent hyperextension if rear ended. Take extra precautions to avoid falling.Avoid frequent bending and stooping  No lifting greater than 10 lbs. May use ice or moist heat for pain. Weight loss is of benefit. Best medication for lumbar disc disease is arthritis medications but due to use of xarelto and GERD and low level renal dysfunction you should avoid the use of arthritis medications like motrin, naprosyn and diclofenac Oxycodone is renewed and you should contact pain management to restart appointments for treatment of chronic back pain.  Exercise is important to improve your indurance and does allow people to function better inspite of back pain.     Follow-Up Instructions: No follow-ups on file.   Orders:  No orders of the defined types were placed in this encounter.  No orders of the defined types were placed in this encounter.   Imaging: No results found.  PMFS History: Patient Active Problem List   Diagnosis Date Noted  . Other spondylosis with radiculopathy, cervical region 08/10/2020    Priority: High    Class: Chronic  . Spinal stenosis of lumbar region 08/28/2017    Priority: High    Class: Chronic  . Herniation of nucleus pulposus of  lumbar intervertebral disc with sciatica 08/28/2017    Priority: High    Class: Chronic  . Cervical spondylosis 08/10/2020  . Cervical post-laminectomy syndrome 08/10/2020  . Sinus bradycardia 07/14/2020  . MDD (major depressive disorder), recurrent severe, without psychosis ([place]) 12/10/2019  . Polysubstance dependence including opioid type drug, episodic abuse ([place]) 12/09/2019  . Substance induced mood disorder ([place]) 12/09/2019  . MDD (major depressive disorder), severe ([place]) 12/09/2019  . Duodenitis   . Community acquired pneumonia 11/10/2019  . Nausea vomiting and diarrhea 11/10/2019  . Diarrhea 11/10/2019  . History of pulmonary embolism 11/10/2019  . AKI (acute kidney injury) ([place]) 11/10/2019  . Sepsis ([place]) 11/09/2019  . 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 Uncomplicated opioid dependence ([place]) 01/08/2019  . Cervicalgia 12/13/2018  . Acute low back pain with bilateral sciatica 12/13/2018  . Chronic pain due to trauma 12/13/2018  . Cervical radiculopathy 12/13/2018  . Cocaine abuse, episodic ([place]) 12/11/2018  . Thrombocytosis 12/05/2018  . Lactic acidosis 12/04/2018  . Pulmonary embolism ([place]) 11/27/2018  . S/P lumbar laminectomy 08/29/2017  . Status post lumbar microdiscectomy 08/28/2017  . Gross hematuria 04/01/2016  . Depression 04/01/2016  . Chronic low back pain 03/29/2016  . Cough 08/05/2015  . Preoperative cardiovascular examination 02/17/2015  . Lumbar disc disease   . Cervical disc disease   . GERD (gastroesophageal reflux disease)   . Anxiety   . Insomnia   . CONSTIPATION 10/05/2010  . MIGRAINE,  COMMON 09/23/2010  . Abdominal pain, other specified site 09/23/2010   Past Medical History:  Diagnosis Date  . Anxiety   . Back injury   . Cervical disc disease   . Chronic back pain   . Chronic low back pain 03/29/2016  . GERD (gastroesophageal reflux disease)   . History of kidney stones    per mother- "there was a diagnoses at some point"  . Insomnia    . Lumbar disc disease   . MIGRAINE, COMMON 09/23/2010   Qualifier: Diagnosis of  By: Jenny Reichmann MD, Hunt Oris   . Pneumonia   . Pulmonary embolism ([place]) 11/2018  . Sickle cell trait (HCC)     Family History  Problem Relation Age of Onset  . Congestive Heart Failure Father   . Hypertension Mother   . Hyperlipidemia Mother   . Stroke Mother     Past Surgical History:  Procedure Laterality Date  . BACK SURGERY    . BIOPSY  11/12/2019   Procedure: BIOPSY;  Surgeon: Thornton Park, MD;  Location: [facility];  Service: Gastroenterology;;  . ESOPHAGOGASTRODUODENOSCOPY (EGD) WITH PROPOFOL N/A 11/12/2019   Procedure: ESOPHAGOGASTRODUODENOSCOPY (EGD) WITH PROPOFOL;  Surgeon: Thornton Park, MD;  Location: [facility];  Service: Gastroenterology;  Laterality: N/A;  . LUMBAR LAMINECTOMY/DECOMPRESSION MICRODISCECTOMY Bilateral 08/28/2017   Procedure: BILATERAL PARTIAL HEMILAMINECTOMIES L4-5 WITH MICRODISCECTOMY;  Surgeon: Jessy Oto, MD;  Location: [facility];  Service: Orthopedics;  Laterality: Bilateral;  . POSTERIOR CERVICAL FUSION/FORAMINOTOMY N/A 08/10/2020   Procedure: LEFT C6-7 FORAMINOTOMY;  Surgeon: Jessy Oto, MD;  Location: [facility];  Service: Orthopedics;  Laterality: N/A;   Social History   Occupational History  . Not on file  Tobacco Use  . Smoking status: Current Every Day Smoker    Packs/day: 1.00    Years: 24.00    Pack years: 24.00    Types: Cigarettes  . Smokeless tobacco: Never Used  . Tobacco comment: notified primary care provider and CCM RNCM  Vaping Use  . Vaping Use: Some days  Substance and Sexual Activity  . Alcohol use: Not Currently    Comment: former use - heavy drinker 8 years ago  . Drug use: No  . Sexual activity: Yes

## 2020-11-09 ENCOUNTER — Telehealth: Payer: Self-pay

## 2020-11-09 NOTE — Telephone Encounter (Signed)
Please advise 

## 2020-11-09 NOTE — Telephone Encounter (Signed)
Pt came into the office requesting a letter surgical release

## 2020-11-12 NOTE — Telephone Encounter (Signed)
Pt came into the office asking about his letter please advise

## 2020-11-12 NOTE — Telephone Encounter (Signed)
Sherrie took care of this, states that thye ust needed a referral placed for [facility] and she states she sent it to them.

## 2021-01-27 ENCOUNTER — Ambulatory Visit (INDEPENDENT_AMBULATORY_CARE_PROVIDER_SITE_OTHER): Payer: Self-pay | Admitting: Specialist

## 2021-01-27 ENCOUNTER — Telehealth: Payer: Self-pay

## 2021-01-27 ENCOUNTER — Encounter: Payer: Self-pay | Admitting: Specialist

## 2021-01-27 VITALS — BP 129/85 | HR 71 | Ht 68.0 in | Wt 152.0 lb

## 2021-01-27 DIAGNOSIS — M4722 Other spondylosis with radiculopathy, cervical region: Secondary | ICD-10-CM

## 2021-01-27 DIAGNOSIS — M5136 Other intervertebral disc degeneration, lumbar region: Secondary | ICD-10-CM

## 2021-01-27 DIAGNOSIS — M5441 Lumbago with sciatica, right side: Secondary | ICD-10-CM

## 2021-01-27 DIAGNOSIS — M5442 Lumbago with sciatica, left side: Secondary | ICD-10-CM

## 2021-01-27 DIAGNOSIS — M47816 Spondylosis without myelopathy or radiculopathy, lumbar region: Secondary | ICD-10-CM

## 2021-01-27 MED ORDER — GABAPENTIN 300 MG PO CAPS: 300.0000 mg | ORAL_CAPSULE | Freq: Three times a day (TID) | ORAL | 3 refills | Status: DC

## 2021-01-27 MED ORDER — OXYCODONE HCL 5 MG PO CAPS: 5.0000 mg | ORAL_CAPSULE | ORAL | 0 refills | Status: DC | PRN

## 2021-01-27 MED ORDER — ONDANSETRON HCL 4 MG PO TABS: 4.0000 mg | ORAL_TABLET | Freq: Three times a day (TID) | ORAL | 0 refills | Status: DC | PRN

## 2021-01-27 NOTE — Patient Instructions (Signed)
Avoid bending, stooping and avoid lifting weights greater than 10 lbs. Avoid prolong standing and walking. Avoid frequent bending and stooping  No lifting greater than 10 lbs. May use ice or moist heat for pain. Weight loss is of benefit. Handicap license is approved. Dr. Newton's secretary/Assistant will call to arrange for epidural steroid injection  

## 2021-01-27 NOTE — Telephone Encounter (Signed)
Pt called and would like to get an appt on the  27th the same day as his mom. With Dr. Ernestina Patches

## 2021-01-27 NOTE — Telephone Encounter (Signed)
Called pt and informed him about his insurance.

## 2021-01-30 IMAGING — CT CT HEAD W/O CM
4 series · 15 of 47 positions shown, 17 images · non-contrast
Comparison: Head CT 08/24/2019

CLINICAL DATA: Encephalopathy.

EXAM:
CT HEAD WITHOUT CONTRAST
TECHNIQUE: Contiguous axial images were obtained from the base of the skull
through the vertex without intravenous contrast.

[Series 3: head without · axial · non-contrast · 0.39mm/px · z∈[+960,+1090]mm · 7 of 36 slices shown, 9 images]
[im 5/36  brain]
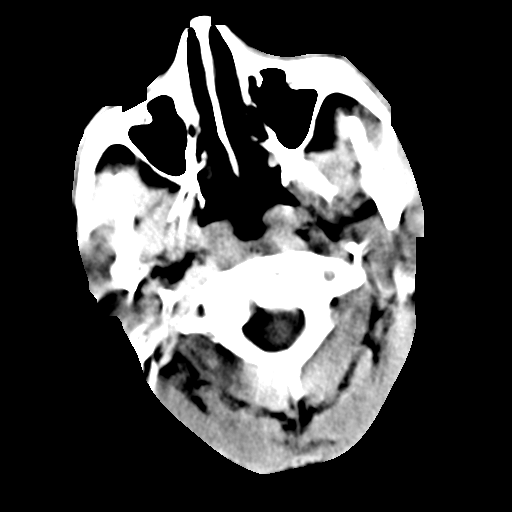
[im 5/36  bone]
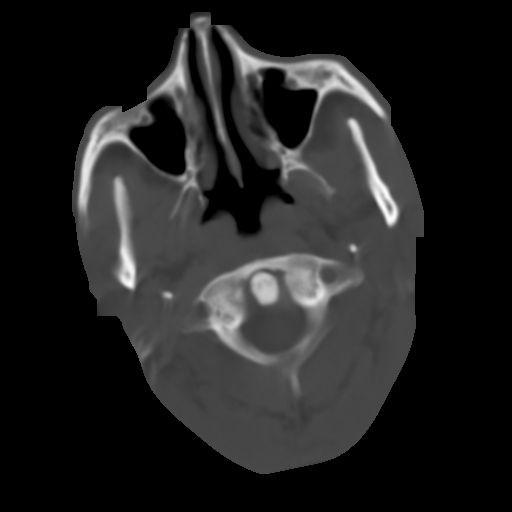
[im 9/36  brain]
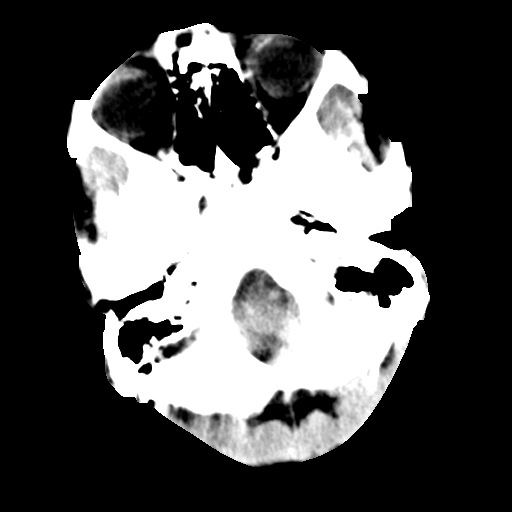
[im 14/36  brain]
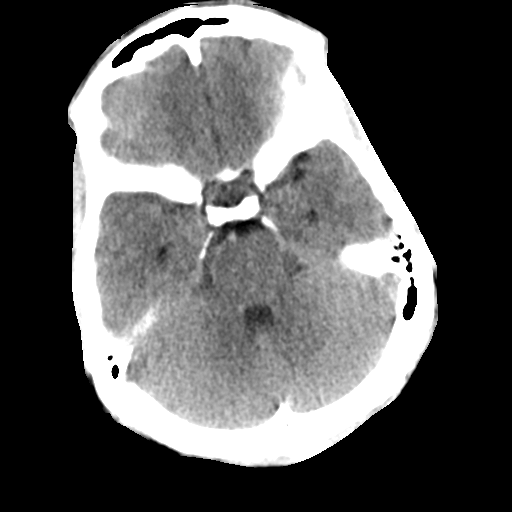
[im 18/36  brain]
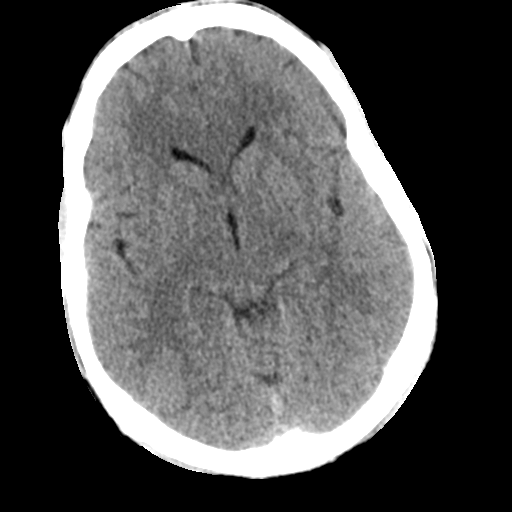
[im 22/36  brain]
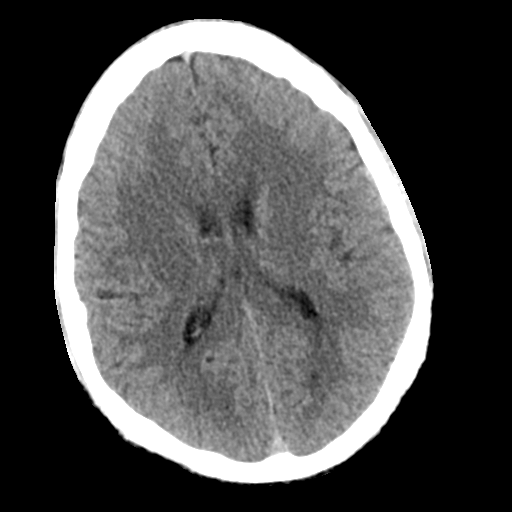
[im 22/36  bone]
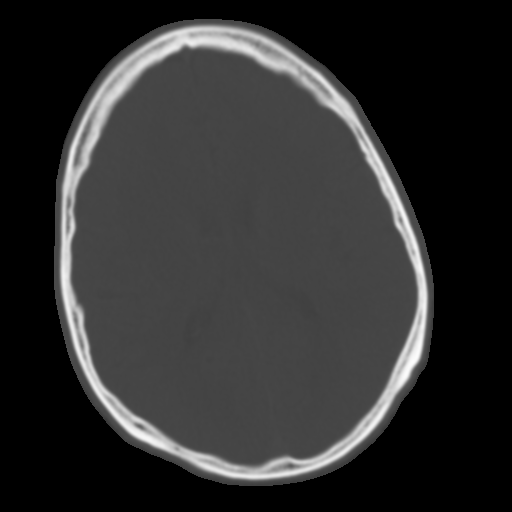
[im 27/36  brain]
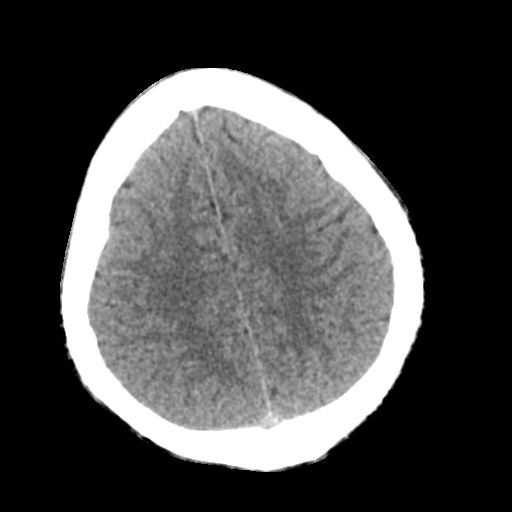
[im 31/36  brain]
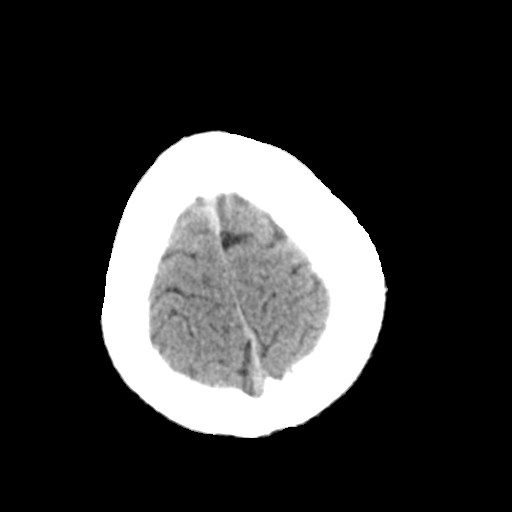

[Series 4: head bone · axial · 0.39mm/px · z∈[+956,+974]mm · 2 of 90 slices shown]
[im 9/90  bone]
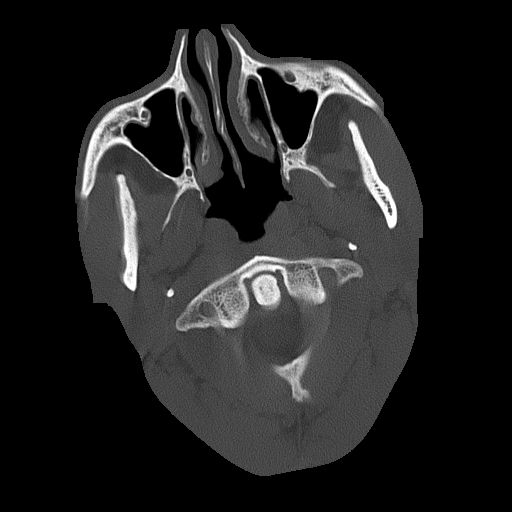
[im 18/90  bone]
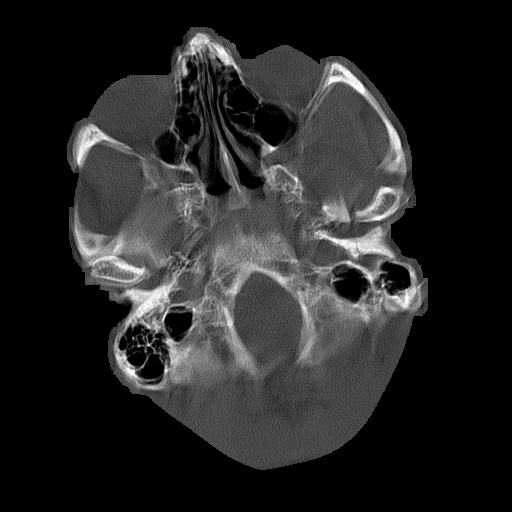

[Series 5: head without cor · coronal · non-contrast · 0.35mm/px · 3 of 77 slices shown]
[im 26/77  brain]
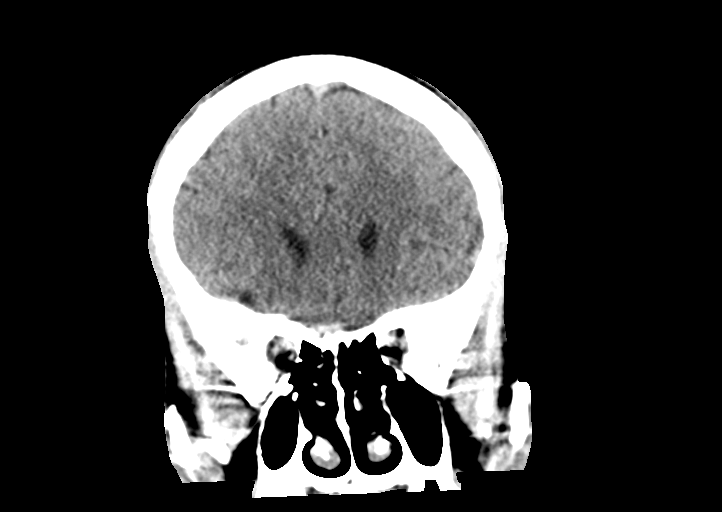
[im 34/77  brain]
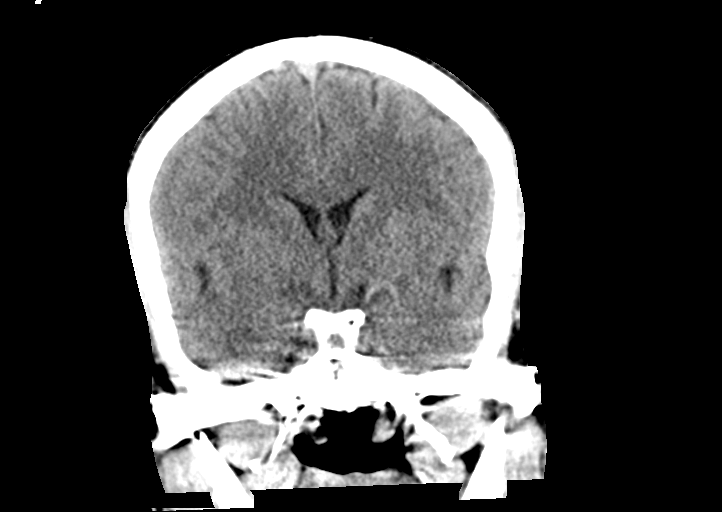
[im 43/77  brain]
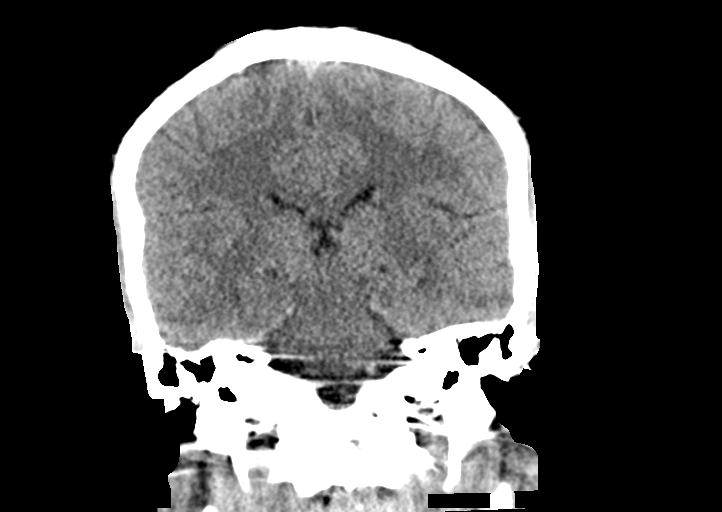

[Series 6: head without sag · sagittal · non-contrast · 0.35mm/px · 3 of 67 slices shown]
[im 23/67  brain]
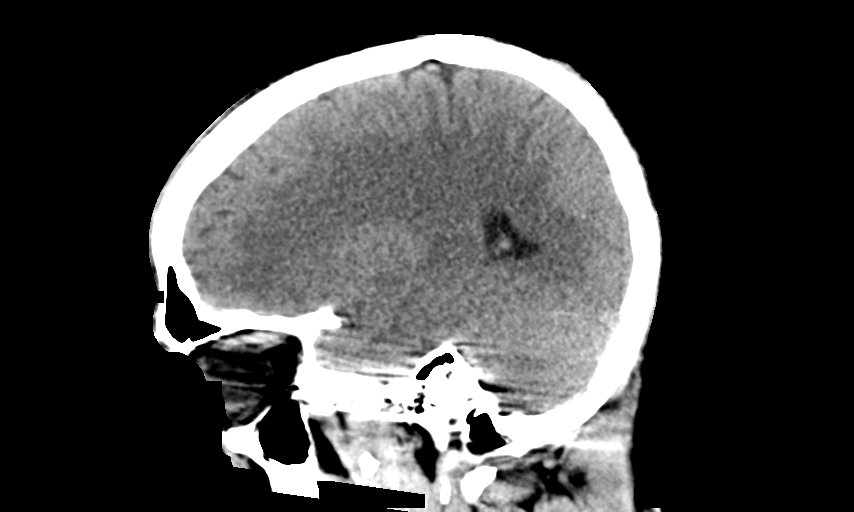
[im 34/67  brain]
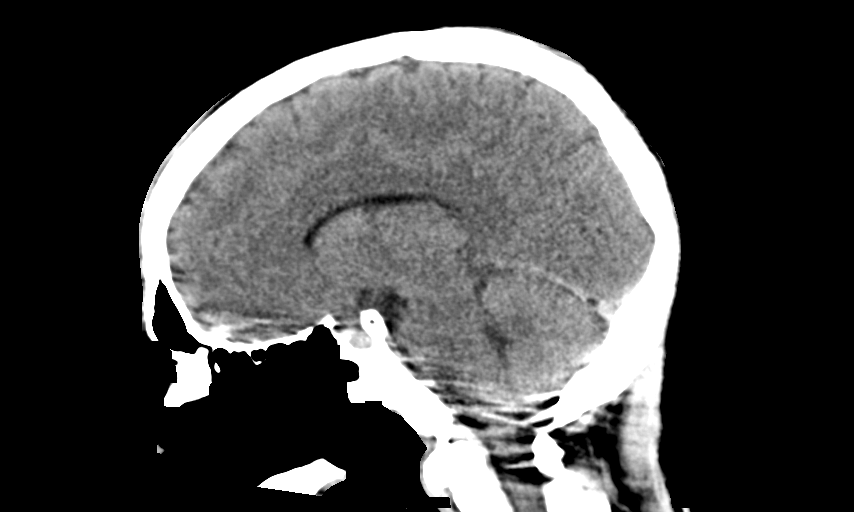
[im 45/67  brain]
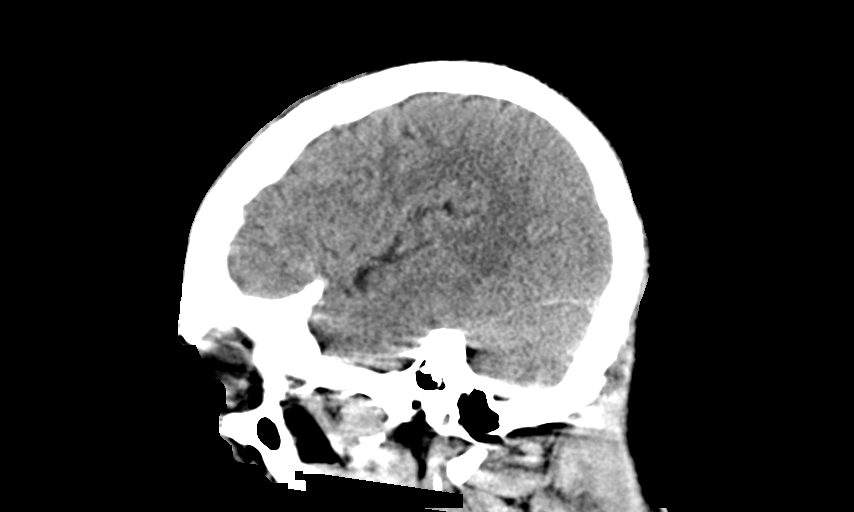

[15 of 47 positions shown; findings below may reference images not displayed]

FINDINGS: Brain: Mild motion artifact through the skull base. No intracranial
hemorrhage, mass effect, or midline shift. No hydrocephalus. The
basilar cisterns are patent. No evidence of territorial infarct or
acute ischemia. No extra-axial or intracranial fluid collection.

Vascular: No hyperdense vessel or unexpected calcification.

Skull: No fracture or focal lesion. Mild motion artifact through the
skull base.

Sinuses/Orbits: No acute finding allowing for motion.

Other: None.
IMPRESSION: Negative head CT allowing for mild skull base motion artifact.

## 2021-01-30 IMAGING — DX DG CHEST 1V PORT
1 series · 1 of 1 positions shown · non-contrast
Comparison: Radiograph 12/03/2018, CT 12/26/2018

CLINICAL DATA: Fever

EXAM:
PORTABLE CHEST 1 VIEW

[chest]
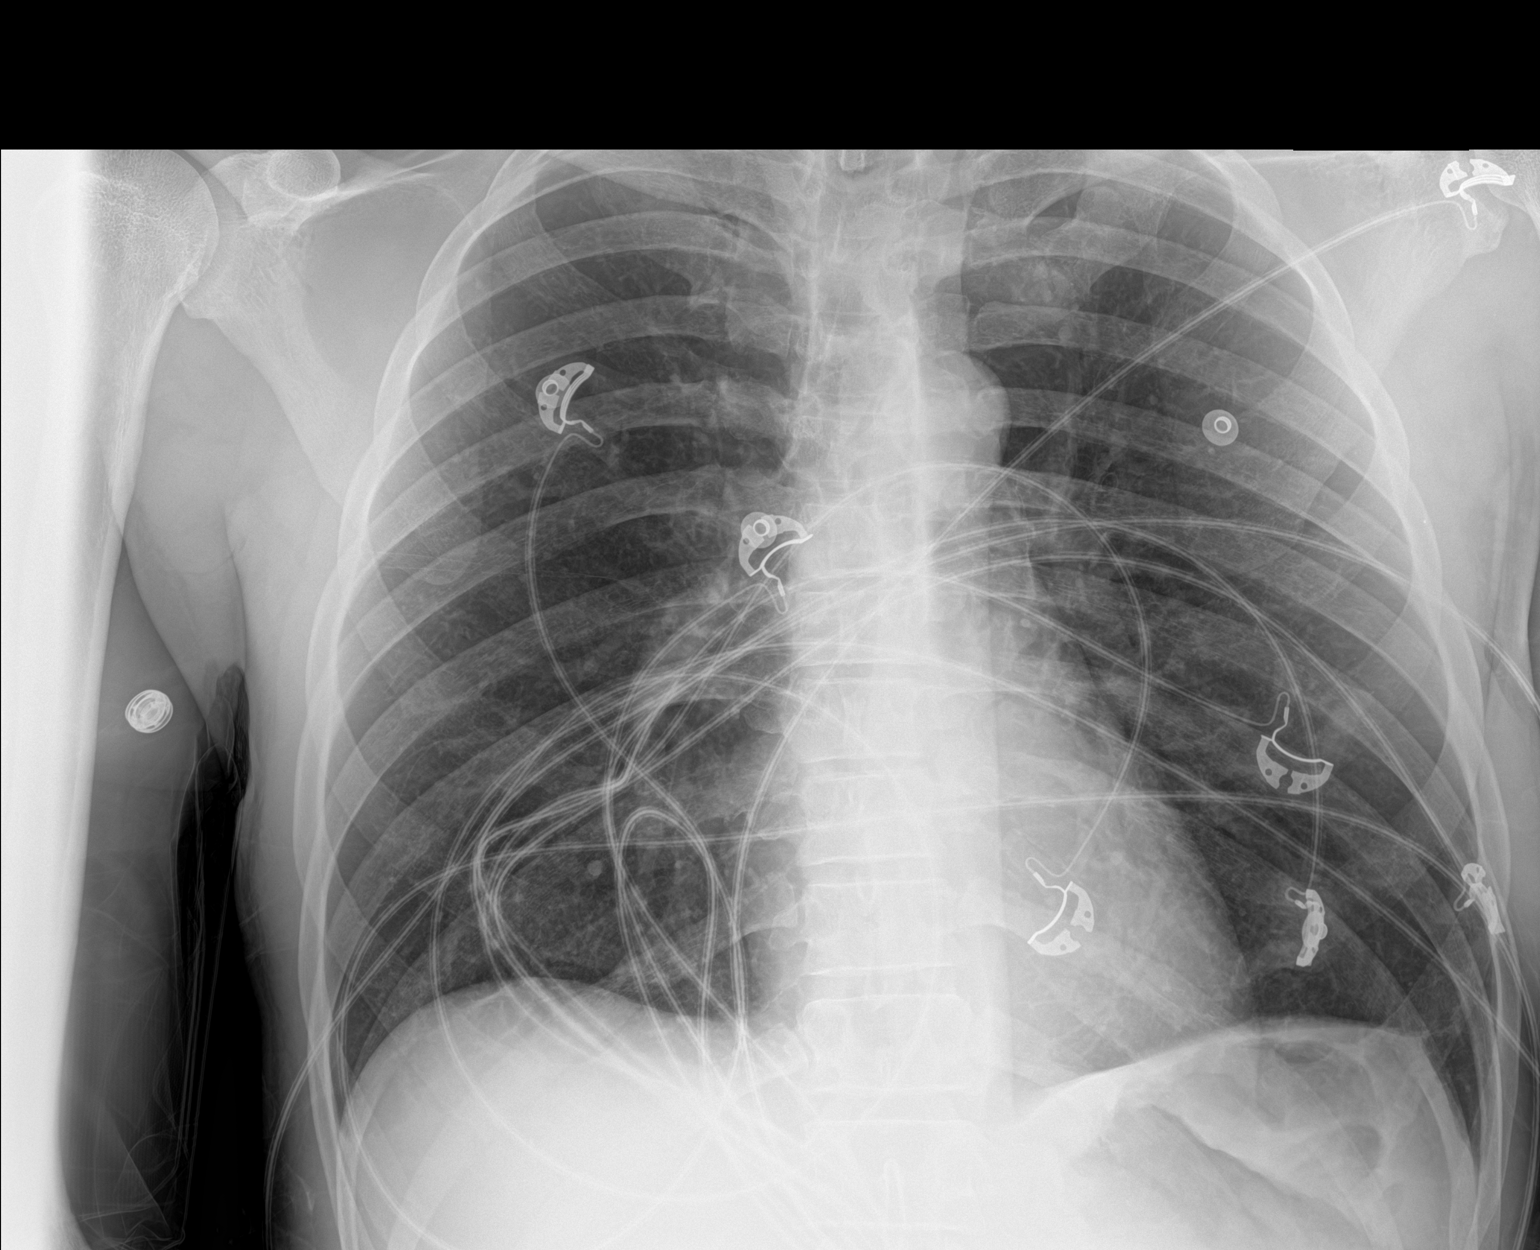

[1 of 1 positions shown; findings below may reference images not displayed]

FINDINGS: Some streaky opacities in the lung bases likely reflect areas of
atelectasis. There is a left perihilar focus of consolidation with
air bronchograms. No pneumothorax. No effusion. The
cardiomediastinal contours are unremarkable. No acute osseous or
soft tissue abnormality.
IMPRESSION: Left perihilar focus of consolidation with air bronchograms could
reflect pneumonia or sequela of aspiration.

## 2021-02-02 ENCOUNTER — Other Ambulatory Visit: Payer: Self-pay | Admitting: Specialist

## 2021-02-02 NOTE — Telephone Encounter (Signed)
Pt called asking for a refill of his oxycodone 5 mg rx and he would like to be notified when it's called in. Pt also mentioned he was supposed to have an appt with Dr. Ernestina Patches for injection and he would like to get that set up.   [phone]

## 2021-02-03 ENCOUNTER — Other Ambulatory Visit: Payer: Self-pay | Admitting: Specialist

## 2021-02-03 MED ORDER — OXYCODONE HCL 5 MG PO CAPS: 5.0000 mg | ORAL_CAPSULE | ORAL | 0 refills | Status: DC | PRN

## 2021-04-27 ENCOUNTER — Encounter (HOSPITAL_COMMUNITY): Payer: Self-pay | Admitting: Emergency Medicine

## 2021-04-27 ENCOUNTER — Ambulatory Visit (HOSPITAL_COMMUNITY)
Admission: EM | Admit: 2021-04-27 | Discharge: 2021-04-27 | Disposition: A | Discharge status: Home / Self Care | Payer: Self-pay | Attending: Emergency Medicine | Admitting: Emergency Medicine

## 2021-04-27 ENCOUNTER — Other Ambulatory Visit: Payer: Self-pay

## 2021-04-27 DIAGNOSIS — L089 Local infection of the skin and subcutaneous tissue, unspecified: Secondary | ICD-10-CM

## 2021-04-27 DIAGNOSIS — L03115 Cellulitis of right lower limb: Secondary | ICD-10-CM

## 2021-04-27 MED ORDER — CEPHALEXIN 500 MG PO CAPS: 500.0000 mg | ORAL_CAPSULE | Freq: Four times a day (QID) | ORAL | 0 refills | Status: DC

## 2021-04-27 NOTE — ED Provider Notes (Signed)
[place]    CSN: [id number] Arrival date & time: 04/27/21  1201      History   Chief Complaint Chief Complaint  Patient presents with   Wound Check    HPI Jason [name] is a 40 y.o. male.   Pt scratched the back calf area of rt side 5 days ago and thinks that it is infected, has swelling pain redness and some thick drainage. Has had a bandage on it but not getting better. Denies any fever, no n/v.    Past Medical History:  Diagnosis Date   Anxiety    Back injury    Cervical disc disease    Chronic back pain    Chronic low back pain 03/29/2016   GERD (gastroesophageal reflux disease)    History of kidney stones    per mother- "there was a diagnoses at some point"   Insomnia    Lumbar disc disease    MIGRAINE, COMMON 09/23/2010   Qualifier: Diagnosis of  By: Jenny Reichmann MD, Hunt Oris    Pneumonia    Pulmonary embolism ([place]) 11/2018   Sickle cell trait [facility])     Patient Active Problem List   Diagnosis Date Noted   Other spondylosis with radiculopathy, cervical region 08/10/2020    Class: Chronic   Cervical spondylosis 08/10/2020   Cervical post-laminectomy syndrome 08/10/2020   Sinus bradycardia 07/14/2020   MDD (major depressive disorder), recurrent severe, without psychosis ([place]) 12/10/2019   Polysubstance dependence including opioid type drug, episodic abuse ([place]) 12/09/2019   Substance induced mood disorder ([place]) 12/09/2019   MDD (major depressive disorder), severe ([place]) 12/09/2019   Duodenitis    Community acquired pneumonia 11/10/2019   Nausea vomiting and diarrhea 11/10/2019   Diarrhea 11/10/2019   History of pulmonary embolism 11/10/2019   AKI (acute kidney injury) ([place]) 11/10/2019   Sepsis ([place]) 11/09/2019   Alcohol abuse, in remission 09/32/3557   Uncomplicated opioid dependence ([place]) 01/08/2019   Cervicalgia 12/13/2018   Acute low back pain with bilateral sciatica 12/13/2018   Chronic pain due to trauma 12/13/2018   Cervical radiculopathy  12/13/2018   Cocaine abuse, episodic ([place]) 12/11/2018   Thrombocytosis 12/05/2018   Lactic acidosis 12/04/2018   Pulmonary embolism ([place]) 11/27/2018   S/P lumbar laminectomy 08/29/2017   Spinal stenosis of lumbar region 08/28/2017    Class: Chronic   Status post lumbar microdiscectomy 08/28/2017   Herniation of nucleus pulposus of lumbar intervertebral disc with sciatica 08/28/2017    Class: Chronic   Gross hematuria 04/01/2016   Depression 04/01/2016   Chronic low back pain 03/29/2016   Cough 08/05/2015   Preoperative cardiovascular examination 02/17/2015   Lumbar disc disease    Cervical disc disease    GERD (gastroesophageal reflux disease)    Anxiety    Insomnia    CONSTIPATION 10/05/2010   MIGRAINE, COMMON 09/23/2010   Abdominal pain, other specified site 09/23/2010    Past Surgical History:  Procedure Laterality Date   BACK SURGERY     BIOPSY  11/12/2019   Procedure: BIOPSY;  Surgeon: Thornton Park, MD;  Location: [facility];  Service: Gastroenterology;;   ESOPHAGOGASTRODUODENOSCOPY (EGD) WITH PROPOFOL N/A 11/12/2019   Procedure: ESOPHAGOGASTRODUODENOSCOPY (EGD) WITH PROPOFOL;  Surgeon: Thornton Park, MD;  Location: [facility];  Service: Gastroenterology;  Laterality: N/A;   LUMBAR LAMINECTOMY/DECOMPRESSION MICRODISCECTOMY Bilateral 08/28/2017   Procedure: BILATERAL PARTIAL HEMILAMINECTOMIES L4-5 WITH MICRODISCECTOMY;  Surgeon: Jessy Oto, MD;  Location: [facility];  Service: Orthopedics;  Laterality: Bilateral;   POSTERIOR CERVICAL  FUSION/FORAMINOTOMY N/A 08/10/2020   Procedure: LEFT C6-7 FORAMINOTOMY;  Surgeon: Jessy Oto, MD;  Location: [facility];  Service: Orthopedics;  Laterality: N/A;       Home Medications    Prior to Admission medications   Medication Sig Start Date End Date Taking? Authorizing Provider  Aspirin-Caffeine (BC FAST PAIN RELIEF PO) Take 1-2 packets by mouth daily as needed (headache).    [provider]   Carboxymethylcellul-Glycerin (CLEAR EYES FOR DRY EYES) 1-0.25 % SOLN Place 1 drop into the left eye daily as needed (dry eye).    [provider]  gabapentin (NEURONTIN) 300 MG capsule Take 1 capsule (300 mg total) by mouth 3 (three) times daily. Patient not taking: Reported on 04/27/2021 01/27/21   Jessy Oto, MD  methocarbamol (ROBAXIN) 500 MG tablet Take 1 tablet (500 mg total) by mouth every 8 (eight) hours as needed for muscle spasms. Patient not taking: Reported on 04/27/2021 08/21/20   Jessy Oto, MD  ondansetron (ZOFRAN) 4 MG tablet Take 1 tablet (4 mg total) by mouth every 8 (eight) hours as needed for nausea or vomiting. Patient not taking: Reported on 04/27/2021 01/27/21   Jessy Oto, MD  oxycodone (OXY-IR) 5 MG capsule Take 1 capsule (5 mg total) by mouth every 4 (four) hours as needed. Patient not taking: Reported on 04/27/2021 02/03/21   Jessy Oto, MD  rivaroxaban (XARELTO) 20 MG TABS tablet Take 1 tablet (20 mg total) by mouth daily with supper. 08/21/20   Jessy Oto, MD    Family History Family History  Problem Relation Age of Onset   Congestive Heart Failure Father    Hypertension Mother    Hyperlipidemia Mother    Stroke Mother     Social History Social History   Tobacco Use   Smoking status: Every Day    Packs/day: 1.00    Years: 24.00    Pack years: 24.00    Types: Cigarettes   Smokeless tobacco: Never   Tobacco comments:    notified primary care provider and CCM RNCM  Vaping Use   Vaping Use: Some days  Substance Use Topics   Alcohol use: Not Currently    Comment: former use - heavy drinker 8 years ago   Drug use: No     Allergies   Morphine and related and Other   Review of Systems Review of Systems  Constitutional:  Negative for activity change and fever.  Respiratory: Negative.    Cardiovascular: Negative.   Gastrointestinal: Negative.   Genitourinary: Negative.   Skin:  Positive for wound.       Pain, swelling,  redness rt calf area. Drainage   Neurological: Negative.     Physical Exam Triage Vital Signs ED Triage Vitals  Enc Vitals Group     BP 04/27/21 1449 124/83     Pulse Rate 04/27/21 1449 64     Resp 04/27/21 1449 20     Temp 04/27/21 1449 99.4 F (37.4 C)     Temp Source 04/27/21 1449 Oral     SpO2 04/27/21 1449 95 %     Weight --      Height --      Head Circumference --      Peak Flow --      Pain Score 04/27/21 1444 6     Pain Loc --      Pain Edu? --      Excl. in Axis? --    No data  found.  Updated Vital Signs BP 124/83 (BP Location: Left Arm)   Pulse 64   Temp 99.4 F (37.4 C) (Oral)   Resp 20   SpO2 95%   Visual Acuity Right Eye Distance:   Left Eye Distance:   Bilateral Distance:    Right Eye Near:   Left Eye Near:    Bilateral Near:     Physical Exam Constitutional:      Appearance: Normal appearance.  Cardiovascular:     Rate and Rhythm: Normal rate.  Pulmonary:     Effort: Pulmonary effort is normal.  Abdominal:     General: Abdomen is flat.  Musculoskeletal:        General: Swelling, tenderness and signs of injury present.  Skin:    General: Skin is warm.     Capillary Refill: Capillary refill takes less than 2 seconds.     Findings: Erythema and lesion present.     Comments: Rt posterior calf area approx 2x2 abrasion moderate amount of thick yellow drainage, erythema noted to surrounding tissue. Warm to touch,   Neurological:     Mental Status: He is alert.     UC Treatments / Results  Labs (all labs ordered are listed, but only abnormal results are displayed) Labs Reviewed - No data to display  EKG   Radiology No results found.  Procedures Procedures (including critical care time)  Medications Ordered in UC Medications - No data to display  Initial Impression / Assessment and Plan / UC Course  I have reviewed the triage vital signs and the nursing notes.  Pertinent labs & imaging results that were available during my care  of the patient were reviewed by me and considered in my medical decision making (see chart for details).     Keep area clean wash and dry keep covered Take full dose of medications you mentioned being allergic to a type of abx and not sure name. You will call us with the name and if so we can call in another abx  Discussed things to look for to return if getting worse   Final Clinical Impressions(s) / UC Diagnoses   Final diagnoses:  None   Discharge Instructions   None    ED Prescriptions   None    PDMP not reviewed this encounter.   Marney Setting, NP 04/27/21 [phone]

## 2021-04-27 NOTE — ED Triage Notes (Signed)
Patient has an open wound to right calf of leg.  Patient has various wounds and non-specific about occurrence.  The wound on back of leg is reported from falling off a brick wall.  Incident occurred a week ago.  Patient reports he has not seen a doctor for injuries.

## 2021-04-27 NOTE — Discharge Instructions (Addendum)
Pt will call us back if the abx prescribed he has allergy unsure of the name of previous medications given,

## 2021-04-29 ENCOUNTER — Telehealth: Payer: Self-pay

## 2021-04-29 NOTE — Telephone Encounter (Signed)
Pt called and would like a rx sent in for oxycodone.  Please advise

## 2021-05-07 ENCOUNTER — Emergency Department (HOSPITAL_COMMUNITY)
Admission: EM | Admit: 2021-05-07 | Discharge: 2021-05-07 | Disposition: A | Discharge status: Home / Self Care | Payer: Managed Care, Other (non HMO) | Attending: Emergency Medicine | Admitting: Emergency Medicine

## 2021-05-07 ENCOUNTER — Encounter (HOSPITAL_COMMUNITY): Payer: Self-pay

## 2021-05-07 ENCOUNTER — Other Ambulatory Visit: Payer: Self-pay

## 2021-05-07 DIAGNOSIS — Z20822 Contact with and (suspected) exposure to covid-19: Secondary | ICD-10-CM | POA: Insufficient documentation

## 2021-05-07 DIAGNOSIS — F17210 Nicotine dependence, cigarettes, uncomplicated: Secondary | ICD-10-CM | POA: Insufficient documentation

## 2021-05-07 DIAGNOSIS — Z79899 Other long term (current) drug therapy: Secondary | ICD-10-CM | POA: Insufficient documentation

## 2021-05-07 DIAGNOSIS — R440 Auditory hallucinations: Secondary | ICD-10-CM | POA: Diagnosis present

## 2021-05-07 DIAGNOSIS — E876 Hypokalemia: Secondary | ICD-10-CM | POA: Insufficient documentation

## 2021-05-07 DIAGNOSIS — F322 Major depressive disorder, single episode, severe without psychotic features: Secondary | ICD-10-CM | POA: Insufficient documentation

## 2021-05-07 DIAGNOSIS — R45851 Suicidal ideations: Secondary | ICD-10-CM | POA: Insufficient documentation

## 2021-05-07 DIAGNOSIS — R7989 Other specified abnormal findings of blood chemistry: Secondary | ICD-10-CM | POA: Diagnosis not present

## 2021-05-07 DIAGNOSIS — Z7982 Long term (current) use of aspirin: Secondary | ICD-10-CM | POA: Diagnosis not present

## 2021-05-07 LAB — CBC WITH DIFFERENTIAL/PLATELET
Abs Immature Granulocytes: 0.01 10*3/uL (ref 0.00–0.07)
Basophils Absolute: 0 10*3/uL (ref 0.0–0.1)
Basophils Relative: 0 %
Eosinophils Absolute: 0.1 10*3/uL (ref 0.0–0.5)
Eosinophils Relative: 1 %
HCT: 38.2 % — ABNORMAL LOW (ref 39.0–52.0)
Hemoglobin: 13 g/dL (ref 13.0–17.0)
Immature Granulocytes: 0 %
Lymphocytes Relative: 34 %
Lymphs Abs: 2.8 10*3/uL (ref 0.7–4.0)
MCH: 27.3 pg (ref 26.0–34.0)
MCHC: 34 g/dL (ref 30.0–36.0)
MCV: 80.1 fL (ref 80.0–100.0)
Monocytes Absolute: 1.1 10*3/uL — ABNORMAL HIGH (ref 0.1–1.0)
Monocytes Relative: 13 %
Neutro Abs: 4.3 10*3/uL (ref 1.7–7.7)
Neutrophils Relative %: 52 %
Platelets: 369 10*3/uL (ref 150–400)
RBC: 4.77 MIL/uL (ref 4.22–5.81)
RDW: 14.5 % (ref 11.5–15.5)
WBC: 8.3 10*3/uL (ref 4.0–10.5)
nRBC: 0 % (ref 0.0–0.2)

## 2021-05-07 LAB — RAPID URINE DRUG SCREEN, HOSP PERFORMED
Amphetamines: NOT DETECTED
Barbiturates: NOT DETECTED
Benzodiazepines: POSITIVE — AB
Cocaine: NOT DETECTED
Opiates: POSITIVE — AB
Tetrahydrocannabinol: NOT DETECTED

## 2021-05-07 LAB — ACETAMINOPHEN LEVEL: Acetaminophen (Tylenol), Serum: <10 ug/mL — ABNORMAL LOW (ref 10–30)

## 2021-05-07 LAB — COMPREHENSIVE METABOLIC PANEL
ALT: 14 U/L (ref 0–44)
AST: 17 U/L (ref 15–41)
Albumin: 4.1 g/dL (ref 3.5–5.0)
Alkaline Phosphatase: 52 U/L (ref 38–126)
Anion gap: 10 (ref 5–15)
BUN: 11 mg/dL (ref 6–20)
CO2: 25 mmol/L (ref 22–32)
Calcium: 9.1 mg/dL (ref 8.9–10.3)
Chloride: 102 mmol/L (ref 98–111)
Creatinine, Ser: 1.43 mg/dL — ABNORMAL HIGH (ref 0.61–1.24)
GFR, Estimated: >60 mL/min (ref >60)
Glucose, Bld: 104 mg/dL — ABNORMAL HIGH (ref 70–99)
Potassium: 3.2 mmol/L — ABNORMAL LOW (ref 3.5–5.1)
Sodium: 137 mmol/L (ref 135–145)
Total Bilirubin: 0.8 mg/dL (ref 0.3–1.2)
Total Protein: 6.9 g/dL (ref 6.5–8.1)

## 2021-05-07 LAB — SALICYLATE LEVEL: Salicylate Lvl: <7 mg/dL — ABNORMAL LOW (ref 7.0–30.0)

## 2021-05-07 LAB — RESP PANEL BY RT-PCR (FLU A&B, COVID) ARPGX2
Influenza A by PCR: NEGATIVE
Influenza B by PCR: NEGATIVE
SARS Coronavirus 2 by RT PCR: NEGATIVE

## 2021-05-07 LAB — ETHANOL: Alcohol, Ethyl (B): <10 mg/dL (ref <10)

## 2021-05-07 MED ORDER — ALUM & MAG HYDROXIDE-SIMETH 200-200-20 MG/5ML PO SUSP: 30.0000 mL | Freq: Four times a day (QID) | ORAL | Status: DC | PRN

## 2021-05-07 MED ORDER — POTASSIUM CHLORIDE CRYS ER 20 MEQ PO TBCR
40.0000 meq | EXTENDED_RELEASE_TABLET | Freq: Once | ORAL | Status: AC
  Administered 2021-05-07: 40 meq via ORAL
  Filled 2021-05-07: qty 2

## 2021-05-07 MED ORDER — ACETAMINOPHEN 325 MG PO TABS
650.0000 mg | ORAL_TABLET | ORAL | Status: DC | PRN
  Administered 2021-05-07 (×2): 650 mg via ORAL
  Filled 2021-05-07 (×2): qty 2

## 2021-05-07 MED ORDER — NICOTINE 21 MG/24HR TD PT24
21.0000 mg | MEDICATED_PATCH | Freq: Every day | TRANSDERMAL | Status: DC
  Filled 2021-05-07: qty 1

## 2021-05-07 MED ORDER — ONDANSETRON HCL 4 MG PO TABS: 4.0000 mg | ORAL_TABLET | Freq: Three times a day (TID) | ORAL | Status: DC | PRN

## 2021-05-07 NOTE — ED Provider Notes (Signed)
[facility] EMERGENCY DEPARTMENT Provider Note   CSN: [id number] Arrival date & time: 05/07/21  0128     History Chief Complaint  Patient presents with  . Suicidal  . Hallucinations    Jason [name] is a 40 y.o. male.  40 y/o male with hx of chronic pain, PE, sickle cell trait, presents to the ED for suicidal ideations. Reports SI x 12 years since his brother passed away. Most recent suicide attempt was in Feb 2021. Denies any suicide attempt tonight as well as any suicidal plan. Has auditory hallucinations which are sporadic; no AVH at this time. Denies illicit drug use, ETOH use. Is prescribed chronic percocet. Brought in by mother for evaluation tonight.  As an aside, fell off a brick wall 1 month ago and has continued picking at his wounds, abrasions resulting in poor healing and persistent bleeding.  The history is provided by the patient. No language interpreter was used.      Past Medical History:  Diagnosis Date  . Anxiety   . Back injury   . Cervical disc disease   . Chronic back pain   . Chronic low back pain 03/29/2016  . GERD (gastroesophageal reflux disease)   . History of kidney stones    per mother- "there was a diagnoses at some point"  . Insomnia   . Lumbar disc disease   . MIGRAINE, COMMON 09/23/2010   Qualifier: Diagnosis of  By: Jenny Reichmann MD, Hunt Oris   . Pneumonia   . Pulmonary embolism ([place]) 11/2018  . Sickle cell trait [facility])     Patient Active Problem List   Diagnosis Date Noted  . Other spondylosis with radiculopathy, cervical region 08/10/2020    Class: Chronic  . Cervical spondylosis 08/10/2020  . Cervical post-laminectomy syndrome 08/10/2020  . Sinus bradycardia 07/14/2020  . MDD (major depressive disorder), recurrent severe, without psychosis ([place]) 12/10/2019  . Polysubstance dependence including opioid type drug, episodic abuse ([place]) 12/09/2019  . Substance induced mood disorder ([place]) 12/09/2019  . MDD (major depressive  disorder), severe ([place]) 12/09/2019  . Duodenitis   . Community acquired pneumonia 11/10/2019  . Nausea vomiting and diarrhea 11/10/2019  . Diarrhea 11/10/2019  . History of pulmonary embolism 11/10/2019  . AKI (acute kidney injury) ([place]) 11/10/2019  . Sepsis ([place]) 11/09/2019  . 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 Uncomplicated opioid dependence ([place]) 01/08/2019  . Cervicalgia 12/13/2018  . Acute low back pain with bilateral sciatica 12/13/2018  . Chronic pain due to trauma 12/13/2018  . Cervical radiculopathy 12/13/2018  . Cocaine abuse, episodic ([place]) 12/11/2018  . Thrombocytosis 12/05/2018  . Lactic acidosis 12/04/2018  . Pulmonary embolism ([place]) 11/27/2018  . S/P lumbar laminectomy 08/29/2017  . Spinal stenosis of lumbar region 08/28/2017    Class: Chronic  . Status post lumbar microdiscectomy 08/28/2017  . 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 Gross hematuria 04/01/2016  . Depression 04/01/2016  . Chronic low back pain 03/29/2016  . Cough 08/05/2015  . Preoperative cardiovascular examination 02/17/2015  . Lumbar disc disease   . Cervical disc disease   . GERD (gastroesophageal reflux disease)   . Anxiety   . Insomnia   . CONSTIPATION 10/05/2010  . MIGRAINE, COMMON 09/23/2010  . Abdominal pain, other specified site 09/23/2010    Past Surgical History:  Procedure Laterality Date  . BACK SURGERY    . BIOPSY  11/12/2019   Procedure: BIOPSY;  Surgeon: Thornton Park, MD;  Location: [facility];  Service: Gastroenterology;;  . ESOPHAGOGASTRODUODENOSCOPY (EGD) WITH PROPOFOL N/A 11/12/2019   Procedure: ESOPHAGOGASTRODUODENOSCOPY (EGD) WITH PROPOFOL;  Surgeon: Thornton Park, MD;  Location: [facility];  Service: Gastroenterology;  Laterality: N/A;  . LUMBAR LAMINECTOMY/DECOMPRESSION MICRODISCECTOMY Bilateral 08/28/2017   Procedure: BILATERAL PARTIAL HEMILAMINECTOMIES L4-5 WITH MICRODISCECTOMY;   Surgeon: Jessy Oto, MD;  Location: [facility];  Service: Orthopedics;  Laterality: Bilateral;  . POSTERIOR CERVICAL FUSION/FORAMINOTOMY N/A 08/10/2020   Procedure: LEFT C6-7 FORAMINOTOMY;  Surgeon: Jessy Oto, MD;  Location: [facility];  Service: Orthopedics;  Laterality: N/A;       Family History  Problem Relation Age of Onset  . Congestive Heart Failure Father   . Hypertension Mother   . Hyperlipidemia Mother   . Stroke Mother     Social History   Tobacco Use  . Smoking status: Every Day    Packs/day: 1.00    Years: 24.00    Pack years: 24.00    Types: Cigarettes  . Smokeless tobacco: Never  . Tobacco comments:    notified primary care provider and CCM RNCM  Vaping Use  . Vaping Use: Some days  Substance Use Topics  . Alcohol use: Not Currently    Comment: former use - heavy drinker 8 years ago  . Drug use: No    Home Medications Prior to Admission medications   Medication Sig Start Date End Date Taking? Authorizing Provider  Aspirin-Caffeine (BC FAST PAIN RELIEF PO) Take 1-2 packets by mouth daily as needed (headache).    [provider]  Carboxymethylcellul-Glycerin (CLEAR EYES FOR DRY EYES) 1-0.25 % SOLN Place 1 drop into the left eye daily as needed (dry eye).    [provider]  cephALEXin (KEFLEX) 500 MG capsule Take 1 capsule (500 mg total) by mouth 4 (four) times daily. Patient not taking: Reported on 05/07/2021 04/27/21   Marney Setting, NP  gabapentin (NEURONTIN) 300 MG capsule Take 1 capsule (300 mg total) by mouth 3 (three) times daily. Patient not taking: No sig reported 01/27/21   Jessy Oto, MD  methocarbamol (ROBAXIN) 500 MG tablet Take 1 tablet (500 mg total) by mouth every 8 (eight) hours as needed for muscle spasms. Patient not taking: No sig reported 08/21/20   Jessy Oto, MD  ondansetron (ZOFRAN) 4 MG tablet Take 1 tablet (4 mg total) by mouth every 8 (eight) hours as needed for nausea or vomiting. Patient not taking: No  sig reported 01/27/21   Jessy Oto, MD  oxycodone (OXY-IR) 5 MG capsule Take 1 capsule (5 mg total) by mouth every 4 (four) hours as needed. 02/03/21   Jessy Oto, MD  rivaroxaban (XARELTO) 20 MG TABS tablet Take 1 tablet (20 mg total) by mouth daily with supper. Patient not taking: Reported on 05/07/2021 08/21/20   Jessy Oto, MD    Allergies    Morphine and related and Other  Review of Systems   Review of Systems Ten systems reviewed and are negative for acute change, except as noted in the HPI.    Physical Exam Updated Vital Signs Ht '5\' 8"'$  (1.727 m)   Wt 72.6 kg   BMI 24.33 kg/m   Physical Exam Vitals and nursing note reviewed.  Constitutional:      General: He is not in acute distress.    Appearance: He is well-developed. He is not diaphoretic.     Comments: Disheveled appearing  HENT:  Head: Normocephalic and atraumatic.  Eyes:     General: No scleral icterus.    Conjunctiva/sclera: Conjunctivae normal.  Pulmonary:     Effort: Pulmonary effort is normal. No respiratory distress.     Comments: Respirations even and unlabored Musculoskeletal:        General: Normal range of motion.     Cervical back: Normal range of motion.     Comments: Deep abrasion to posterior neck as well as R forearm, proximal RUE, R shin; mild serosanguinous drainage. No induration or purulence.  Skin:    General: Skin is warm and dry.     Coloration: Skin is not pale.     Findings: No erythema or rash.  Neurological:     Mental Status: He is alert and oriented to person, place, and time.  Psychiatric:        Attention and Perception: He does not perceive auditory or visual hallucinations.        Mood and Affect: Mood is depressed. Affect is flat.        Behavior: Behavior is slowed. Behavior is cooperative.        Thought Content: Thought content includes suicidal ideation. Thought content does not include suicidal plan.     Comments: Not reacting to internal stimuli.    ED  Results / Procedures / Treatments   Labs (all labs ordered are listed, but only abnormal results are displayed) Labs Reviewed  CBC WITH DIFFERENTIAL/PLATELET - Abnormal; Notable for the following components:      Result Value   HCT 38.2 (*)    Monocytes Absolute 1.1 (*)    All other components within normal limits  COMPREHENSIVE METABOLIC PANEL - Abnormal; Notable for the following components:   Potassium 3.2 (*)    Glucose, Bld 104 (*)    Creatinine, Ser 1.43 (*)    All other components within normal limits  RAPID URINE DRUG SCREEN, HOSP PERFORMED - Abnormal; Notable for the following components:   Opiates POSITIVE (*)    Benzodiazepines POSITIVE (*)    All other components within normal limits  ACETAMINOPHEN LEVEL - Abnormal; Notable for the following components:   Acetaminophen (Tylenol), Serum <10 (*)    All other components within normal limits  SALICYLATE LEVEL - Abnormal; Notable for the following components:   Salicylate Lvl [id number] (*)    All other components within normal limits  RESP PANEL BY RT-PCR (FLU A&B, COVID) ARPGX2  ETHANOL    EKG None  Radiology No results found.  Procedures Procedures   Medications Ordered in ED Medications - No data to display  ED Course  I have reviewed the triage vital signs and the nursing notes.  Pertinent labs & imaging results that were available during my care of the patient were reviewed by me and considered in my medical decision making (see chart for details).  Clinical Course as of 05/07/21 0629  Fri May 07, 2021  0433 Patient medically cleared. [KH]    Clinical Course User Index [KH] Beverely Pace   MDM Rules/Calculators/A&P                           40 year old male presenting to the emergency department with complaints of suicidal ideations.  Per patient, the sound chronic in nature.  Difficult to discern how they are acutely changed.  No suicidal plan.  He has been medically cleared and is pending TTS  assessment.  Disposition to be determined by  oncoming ED provider.   Final Clinical Impression(s) / ED Diagnoses Final diagnoses:  Suicidal ideation    Rx / DC Orders ED Discharge Orders     None        Antonietta Breach, PA-C 05/07/21 0630    Margette Fast, MD 05/07/21 2337

## 2021-05-07 NOTE — ED Notes (Signed)
Pt ambulated to bathroom 

## 2021-05-07 NOTE — ED Notes (Signed)
RN reviewed discharge instructions w/ pt. Follow up and community resources reviewed, pt had no further questions.

## 2021-05-07 NOTE — ED Notes (Signed)
Gauze bandage applied to wound on posterior neck

## 2021-05-07 NOTE — ED Triage Notes (Addendum)
Pt reports that he is having suicidal thoughts. Pt also reports hearing voices at times. Pt reports he fell off of a brick wall about a month ago. Pt has wounds to his right leg,right arm and neck. Pt reports he keeps picking at the wounds with his hands.

## 2021-05-07 NOTE — ED Notes (Addendum)
Belongings inventoried (1 bag) w/ nursing staff

## 2021-05-07 NOTE — Discharge Instructions (Addendum)
You were seen in the emergency department today for behavioral health assessment.  Your labs did show that your potassium was low, we provided diet guidelines in your discharge instructions.  Your labs also showed that you have kidney function was elevated, please have each of these rechecked by your primary care provider within 1 week.  Please follow-up with attached resources.  Return to the emergency department for any new or worsening symptoms or any other concerns.

## 2021-05-07 NOTE — ED Provider Notes (Signed)
Emergency Medicine Observation Re-evaluation Note  Jason [name] is a 40 y.o. male, seen on rounds today.  Pt initially presented to the ED for complaints of Suicidal and Hallucinations Currently, the patient is resting comfortably, no specific complaints at this time.  Physical Exam  BP 133/75 (BP Location: Right Arm)   Pulse 82   Temp 98.4 F (36.9 C) (Oral)   Resp 16   Ht '5\' 8"'$  (1.727 m)   Wt 72.6 kg   SpO2 95%   BMI 24.33 kg/m  Physical Exam General: Resting comfortably Cardiac: Regular rate.  Lungs: No respiratory distress.  Psych: calm, cooperative.   ED Course / MDM    I have reviewed the labs performed to date as well as medications administered while in observation.  Recent changes in the last 24 hours include - mild hypokalemia on labs- oral supplemention ordered, mild elevation in creatinine. Will need PCP follow up.   Plan  Current plan is for discharge, patient psychiatrically cleared after TTS assessment.    JAHMEER DOSSEY is not under involuntary commitment.     Leafy Kindle 05/07/21 1129    Pattricia Boss, MD 05/17/21 1350

## 2021-05-07 NOTE — BHH Counselor (Signed)
Per Joaquin Music, PMHNP patient does not meet in patient care criteria and is psych cleared.

## 2021-05-07 NOTE — BH Assessment (Signed)
Clinician made contact with pt's team in an effort to determine if it would be possible to move pt into a room for his Independence Assessment and to determined if he was medically cleared. Pt's team identified that pt could not be moved at that time and that he was not yet medically cleared; the order for his [place] Assessment was d/c. TTS will check back in when pt's [place] Assessment has been re-ordered.

## 2021-05-31 ENCOUNTER — Ambulatory Visit: Payer: Self-pay | Admitting: Specialist

## 2021-10-05 ENCOUNTER — Encounter (HOSPITAL_COMMUNITY): Payer: Self-pay | Admitting: Emergency Medicine

## 2021-10-05 ENCOUNTER — Ambulatory Visit (HOSPITAL_COMMUNITY)
Admission: EM | Admit: 2021-10-05 | Discharge: 2021-10-05 | Disposition: A | Discharge status: Home / Self Care | Payer: Managed Care, Other (non HMO) | Attending: Emergency Medicine | Admitting: Emergency Medicine

## 2021-10-05 ENCOUNTER — Other Ambulatory Visit: Payer: Self-pay

## 2021-10-05 DIAGNOSIS — R21 Rash and other nonspecific skin eruption: Secondary | ICD-10-CM | POA: Diagnosis not present

## 2021-10-05 LAB — HIV ANTIBODY (ROUTINE TESTING W REFLEX): HIV Screen 4th Generation wRfx: NONREACTIVE

## 2021-10-05 MED ORDER — MUPIROCIN CALCIUM 2 % EX CREA: 1.0000 "application " | TOPICAL_CREAM | Freq: Two times a day (BID) | CUTANEOUS | 0 refills | Status: DC

## 2021-10-05 MED ORDER — CEPHALEXIN 500 MG PO CAPS: 500.0000 mg | ORAL_CAPSULE | Freq: Four times a day (QID) | ORAL | 0 refills | Status: AC

## 2021-10-05 NOTE — ED Provider Notes (Signed)
[place]  ____________________________________________  Time seen: Approximately 5:12 PM  I have reviewed the triage vital signs and the nursing notes.   HISTORY  Chief Complaint Wound Infection   Historian Patient     HPI Jason [name] is a 40 y.o. male presents to the urgent care with concern for possible wound infection for the past 6 months.  Patient has 8 cm x 5 cm posterior neck ulceration.  Ulceration is dry with no expression of purulence or serosanguineous exudate.  Patient reports that it is been present for the past 6 months.  He states that he has similar lesions on his legs and is prone to cellulitis.  He states it has been several years since he had an HIV test.  No fever or chills at home.  No weight loss or weight gain.  Patient reports that he has been using Vaseline with little relief.   Past Medical History:  Diagnosis Date   Anxiety    Back injury    Cervical disc disease    Chronic back pain    Chronic low back pain 03/29/2016   GERD (gastroesophageal reflux disease)    History of kidney stones    per mother- "there was a diagnoses at some point"   Insomnia    Lumbar disc disease    MIGRAINE, COMMON 09/23/2010   Qualifier: Diagnosis of  By: Jenny Reichmann MD, Hunt Oris    Pneumonia    Pulmonary embolism ([place]) 11/2018   Sickle cell trait ([place])      Immunizations up to date:  Yes.     Past Medical History:  Diagnosis Date   Anxiety    Back injury    Cervical disc disease    Chronic back pain    Chronic low back pain 03/29/2016   GERD (gastroesophageal reflux disease)    History of kidney stones    per mother- "there was a diagnoses at some point"   Insomnia    Lumbar disc disease    MIGRAINE, COMMON 09/23/2010   Qualifier: Diagnosis of  By: Jenny Reichmann MD, Hunt Oris    Pneumonia    Pulmonary embolism ([place]) 11/2018   Sickle cell trait [facility])     Patient Active Problem List   Diagnosis Date Noted   Other spondylosis with radiculopathy,  cervical region 08/10/2020    Class: Chronic   Cervical spondylosis 08/10/2020   Cervical post-laminectomy syndrome 08/10/2020   Sinus bradycardia 07/14/2020   MDD (major depressive disorder), recurrent severe, 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/10/2019   Polysubstance dependence including opioid type drug, episodic abuse ([place]) 12/09/2019   Substance induced mood disorder ([place]) 12/09/2019   MDD (major depressive disorder), severe ([place]) 12/09/2019   Duodenitis    Community acquired pneumonia 11/10/2019   Nausea vomiting and diarrhea 11/10/2019   Diarrhea 11/10/2019   History of pulmonary embolism 11/10/2019   AKI (acute kidney injury) ([place]) 11/10/2019   Sepsis ([place]) 11/09/2019   Alcohol abuse, in remission 56/38/7564   Uncomplicated opioid dependence ([place]) 01/08/2019   Cervicalgia 12/13/2018   Acute low back pain with bilateral sciatica 12/13/2018   Chronic pain due to trauma 12/13/2018   Cervical radiculopathy 12/13/2018   Cocaine abuse, episodic ([place]) 12/11/2018   Thrombocytosis 12/05/2018   Lactic acidosis 12/04/2018   Pulmonary embolism ([place]) 11/27/2018   S/P lumbar laminectomy 08/29/2017   Spinal stenosis of lumbar region 08/28/2017    Class: Chronic   Status post lumbar microdiscectomy 08/28/2017   Herniation of nucleus pulposus of lumbar  intervertebral disc with sciatica 08/28/2017    Class: Chronic   Gross hematuria 04/01/2016   Depression 04/01/2016   Chronic low back pain 03/29/2016   Cough 08/05/2015   Preoperative cardiovascular examination 02/17/2015   Lumbar disc disease    Cervical disc disease    GERD (gastroesophageal reflux disease)    Anxiety    Insomnia    CONSTIPATION 10/05/2010   MIGRAINE, COMMON 09/23/2010   Abdominal pain, other specified site 09/23/2010    Past Surgical History:  Procedure Laterality Date   BACK SURGERY     BIOPSY  11/12/2019   Procedure: BIOPSY;  Surgeon: Thornton Park, MD;  Location[facility] ENDOSCOPY;  Service: Gastroenterology;;    ESOPHAGOGASTRODUODENOSCOPY (EGD) WITH PROPOFOL N/A 11/12/2019   Procedure: ESOPHAGOGASTRODUODENOSCOPY (EGD) WITH PROPOFOL;  Surgeon: Thornton Park, MD;  Location: [place];  Service: Gastroenterology;  Laterality: N/A;   LUMBAR LAMINECTOMY/DECOMPRESSION MICRODISCECTOMY Bilateral 08/28/2017   Procedure: BILATERAL PARTIAL HEMILAMINECTOMIES L4-5 WITH MICRODISCECTOMY;  Surgeon: Jessy Oto, MD;  Location: [place];  Service: Orthopedics;  Laterality: Bilateral;   POSTERIOR CERVICAL FUSION/FORAMINOTOMY N/A 08/10/2020   Procedure: LEFT C6-7 FORAMINOTOMY;  Surgeon: Jessy Oto, MD;  Location: [place];  Service: Orthopedics;  Laterality: N/A;    Prior to Admission medications   Medication Sig Start Date End Date Taking? Authorizing Provider  cephALEXin (KEFLEX) 500 MG capsule Take 1 capsule (500 mg total) by mouth 4 (four) times daily for 7 days. 10/05/21 10/12/21 Yes Vallarie Mare M, PA-C  mupirocin cream (BACTROBAN) 2 % Apply 1 application topically 2 (two) times daily. 10/05/21  Yes Vallarie Mare M, PA-C  oxycodone (OXY-IR) 5 MG capsule Take 1 capsule (5 mg total) by mouth every 4 (four) hours as needed. 02/03/21  Yes Jessy Oto, MD  rivaroxaban (XARELTO) 20 MG TABS tablet Take 1 tablet (20 mg total) by mouth daily with supper. 08/21/20  Yes Jessy Oto, MD  Aspirin-Caffeine (BC FAST PAIN RELIEF PO) Take 1-2 packets by mouth daily as needed (headache).    [provider]  gabapentin (NEURONTIN) 300 MG capsule Take 1 capsule (300 mg total) by mouth 3 (three) times daily. Patient not taking: Reported on 05/07/2021 01/27/21   Jessy Oto, MD  methocarbamol (ROBAXIN) 500 MG tablet Take 1 tablet (500 mg total) by mouth every 8 (eight) hours as needed for muscle spasms. Patient not taking: Reported on 05/07/2021 08/21/20   Jessy Oto, MD  ondansetron (ZOFRAN) 4 MG tablet Take 1 tablet (4 mg total) by mouth every 8 (eight) hours as needed for nausea or vomiting. Patient not taking:  Reported on 05/07/2021 01/27/21   Jessy Oto, MD    Allergies Morphine and related and Other  Family History  Problem Relation Age of Onset   Congestive Heart Failure Father    Hypertension Mother    Hyperlipidemia Mother    Stroke Mother     Social History Social History   Tobacco Use   Smoking status: Every Day    Packs/day: 1.00    Years: 24.00    Pack years: 24.00    Types: Cigarettes   Smokeless tobacco: Never   Tobacco comments:    notified primary care provider and CCM RNCM  Vaping Use   Vaping Use: Some days  Substance Use Topics   Alcohol use: Not Currently    Comment: former use - heavy drinker 8 years ago   Drug use: No     Review of Systems  Constitutional: No fever/chills Eyes:  No discharge ENT: No upper respiratory complaints. Respiratory: no cough. No SOB/ use of accessory muscles to breath Gastrointestinal:   No nausea, no vomiting.  No diarrhea.  No constipation. Musculoskeletal: Negative for musculoskeletal pain. Skin: Patient has rash.     ____________________________________________   PHYSICAL EXAM:  VITAL SIGNS: ED Triage Vitals  Enc Vitals Group     BP 10/05/21 1617 112/63     Pulse Rate 10/05/21 1617 72     Resp 10/05/21 1617 16     Temp 10/05/21 1617 98.6 F (37 C)     Temp Source 10/05/21 1617 Oral     SpO2 10/05/21 1617 98 %     Weight --      Height --      Head Circumference --      Peak Flow --      Pain Score 10/05/21 1621 2     Pain Loc --      Pain Edu? --      Excl. in [place]? --      Constitutional: Alert and oriented. Well appearing and in no acute distress. Eyes: Conjunctivae are normal. PERRL. EOMI. Head: Atraumatic. ENT: Cardiovascular: Normal rate, regular rhythm. Normal S1 and S2.  Good peripheral circulation. Respiratory: Normal respiratory effort without tachypnea or retractions. Lungs CTAB. Good air entry to the bases with no decreased or absent breath sounds Gastrointestinal: Bowel sounds x 4  quadrants. Soft and nontender to palpation. No guarding or rigidity. No distention. Musculoskeletal: Full range of motion to all extremities. No obvious deformities noted Neurologic:  Normal for age. No gross focal neurologic deficits are appreciated.  Skin: Patient has a 8 cm x 5 cm posterior neck ulceration and several small circumferential ulcerations along the lower extremities.  Patient has scabbing of ulcerations along lower extremities. Psychiatric: Mood and affect are normal for age. Speech and behavior are normal.   ____________________________________________   LABS (all labs ordered are listed, but only abnormal results are displayed)  Labs Reviewed  HIV ANTIBODY (ROUTINE TESTING W REFLEX)  RPR   ____________________________________________  EKG   ____________________________________________  RADIOLOGY   No results found.  ____________________________________________    PROCEDURES  Procedure(s) performed:     Procedures     Medications - No data to display   ____________________________________________   INITIAL IMPRESSION / ASSESSMENT AND PLAN / ED COURSE  Pertinent labs & imaging results that were available during my care of the patient were reviewed by me and considered in my medical decision making (see chart for details).      Assessment and plan Rash 40 year old male presents to the urgent care with wounds in various stages of healing for the past 6 months.  Differential diagnosis includes cellulitis, HIV, syphilis.Marland Kitchen  HIV and RPR testing in process at this time.  We will treat patient empirically with Keflex and topical mupirocin and refer to wound care until blood test results return  Patient feels comfortable with plan.     ____________________________________________  FINAL CLINICAL IMPRESSION(S) / ED DIAGNOSES  Final diagnoses:  Rash      NEW MEDICATIONS STARTED DURING THIS VISIT:  ED Discharge Orders           Ordered    cephALEXin (KEFLEX) 500 MG capsule  4 times daily        10/05/21 1655    mupirocin cream (BACTROBAN) 2 %  2 times daily        10/05/21 1655  This chart was dictated using voice recognition software/Dragon. Despite best efforts to proofread, errors can occur which can change the meaning. Any change was purely unintentional.     Lannie Fields, PA-C 10/05/21 1717

## 2021-10-05 NOTE — ED Triage Notes (Signed)
Patient c/o wound infection x 6 months.   Patient endorses wound is present on neck.   Patient states " I keep scratching it and it keeps getting bigger".   Patient endorses " green pus" drainage. Patient endorses pain.   Patient has used Vaseline with relief of itching.

## 2021-10-05 NOTE — Discharge Instructions (Signed)
Take Keflex 4 times daily for the next 7 days. Apply mupirocin twice daily for 7 days.

## 2021-10-06 LAB — RPR: RPR Ser Ql: NONREACTIVE

## 2021-11-10 ENCOUNTER — Encounter (HOSPITAL_BASED_OUTPATIENT_CLINIC_OR_DEPARTMENT_OTHER): Payer: Managed Care, Other (non HMO) | Attending: Physician Assistant | Admitting: Physician Assistant

## 2021-12-15 ENCOUNTER — Encounter (HOSPITAL_BASED_OUTPATIENT_CLINIC_OR_DEPARTMENT_OTHER): Payer: Self-pay | Attending: Internal Medicine | Admitting: Physician Assistant

## 2022-09-15 ENCOUNTER — Ambulatory Visit (HOSPITAL_COMMUNITY)
Admission: EM | Admit: 2022-09-15 | Discharge: 2022-09-15 | Disposition: A | Discharge status: Home / Self Care | Payer: BLUE CROSS/BLUE SHIELD | Attending: Internal Medicine | Admitting: Internal Medicine

## 2022-09-15 ENCOUNTER — Encounter (HOSPITAL_COMMUNITY): Payer: Self-pay | Admitting: Emergency Medicine

## 2022-09-15 DIAGNOSIS — H18822 Corneal disorder due to contact lens, left eye: Secondary | ICD-10-CM

## 2022-09-15 MED ORDER — EYE WASH OP SOLN
OPHTHALMIC | Status: AC
  Filled 2022-09-15: qty 118

## 2022-09-15 MED ORDER — TETRACAINE HCL 0.5 % OP SOLN
OPHTHALMIC | Status: AC
  Filled 2022-09-15: qty 4

## 2022-09-15 MED ORDER — ERYTHROMYCIN 5 MG/GM OP OINT: TOPICAL_OINTMENT | Freq: Three times a day (TID) | OPHTHALMIC | 0 refills | Status: AC

## 2022-09-15 MED ORDER — FLUORESCEIN SODIUM 1 MG OP STRP
ORAL_STRIP | OPHTHALMIC | Status: AC
  Filled 2022-09-15: qty 1

## 2022-09-15 NOTE — ED Provider Notes (Signed)
[place]    CSN: [id number] Arrival date & time: 09/15/22  1432      History   Chief Complaint Chief Complaint  Patient presents with   Eye Problem    HPI Jason [name] is a 41 y.o. male comes to the urgent care with burning left eye pain and eye redness of 3 days duration.  Patient wears contact lens.  He slept with his contact lens in place overnight.  Pain is of moderate severity and associated with tearing of the right eye.  Eye discharge is clear.  He has light sensitivity.  No eye pain is associated with left upper eyelid swelling.  No nausea or vomiting.  Patient denies any headaches.  No fever or chills.  HPI  Past Medical History:  Diagnosis Date   Anxiety    Back injury    Cervical disc disease    Chronic back pain    Chronic low back pain 03/29/2016   GERD (gastroesophageal reflux disease)    History of kidney stones    per mother- "there was a diagnoses at some point"   Insomnia    Lumbar disc disease    MIGRAINE, COMMON 09/23/2010   Qualifier: Diagnosis of  By: Jenny Reichmann MD, Hunt Oris    Pneumonia    Pulmonary embolism ([place]) 11/2018   Sickle cell trait [facility])     Patient Active Problem List   Diagnosis Date Noted   Other spondylosis with radiculopathy, cervical region 08/10/2020    Class: Chronic   Cervical spondylosis 08/10/2020   Cervical post-laminectomy syndrome 08/10/2020   Sinus bradycardia 07/14/2020   MDD (major depressive disorder), recurrent severe, 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/10/2019   Polysubstance dependence including opioid type drug, episodic abuse ([place]) 12/09/2019   Substance induced mood disorder ([place]) 12/09/2019   MDD (major depressive disorder), severe ([place]) 12/09/2019   Duodenitis    Community acquired pneumonia 11/10/2019   Nausea vomiting and diarrhea 11/10/2019   Diarrhea 11/10/2019   History of pulmonary embolism 11/10/2019   AKI (acute kidney injury) ([place]) 11/10/2019   Sepsis ([place]) 11/09/2019   Alcohol abuse, in  remission 91/47/8295   Uncomplicated opioid dependence ([place]) 01/08/2019   Cervicalgia 12/13/2018   Acute low back pain with bilateral sciatica 12/13/2018   Chronic pain due to trauma 12/13/2018   Cervical radiculopathy 12/13/2018   Cocaine abuse, episodic ([place]) 12/11/2018   Thrombocytosis 12/05/2018   Lactic acidosis 12/04/2018   Pulmonary embolism ([place]) 11/27/2018   S/P lumbar laminectomy 08/29/2017   Spinal stenosis of lumbar region 08/28/2017    Class: Chronic   Status post lumbar microdiscectomy 08/28/2017   Herniation of nucleus pulposus of lumbar intervertebral disc with sciatica 08/28/2017    Class: Chronic   Gross hematuria 04/01/2016   Depression 04/01/2016   Chronic low back pain 03/29/2016   Cough 08/05/2015   Preoperative cardiovascular examination 02/17/2015   Lumbar disc disease    Cervical disc disease    GERD (gastroesophageal reflux disease)    Anxiety    Insomnia    CONSTIPATION 10/05/2010   MIGRAINE, COMMON 09/23/2010   Abdominal pain, other specified site 09/23/2010    Past Surgical History:  Procedure Laterality Date   BACK SURGERY     BIOPSY  11/12/2019   Procedure: BIOPSY;  Surgeon: Thornton Park, MD;  Location: [place];  Service: Gastroenterology;;   ESOPHAGOGASTRODUODENOSCOPY (EGD) WITH PROPOFOL N/A 11/12/2019   Procedure: ESOPHAGOGASTRODUODENOSCOPY (EGD) WITH PROPOFOL;  Surgeon: Thornton Park, MD;  Location: [place];  Service:  Gastroenterology;  Laterality: N/A;   LUMBAR LAMINECTOMY/DECOMPRESSION MICRODISCECTOMY Bilateral 08/28/2017   Procedure: BILATERAL PARTIAL HEMILAMINECTOMIES L4-5 WITH MICRODISCECTOMY;  Surgeon: Jessy Oto, MD;  Location: [place];  Service: Orthopedics;  Laterality: Bilateral;   POSTERIOR CERVICAL FUSION/FORAMINOTOMY N/A 08/10/2020   Procedure: LEFT C6-7 FORAMINOTOMY;  Surgeon: Jessy Oto, MD;  Location: [place];  Service: Orthopedics;  Laterality: N/A;       Home Medications    Prior to Admission  medications   Medication Sig Start Date End Date Taking? Authorizing Provider  erythromycin ophthalmic ointment Place into the left eye 3 (three) times daily for 5 days. Place a 1/2 inch ribbon of ointment into the lower eyelid. 09/15/22 09/20/22 Yes Ysabel Cowgill, Myrene Galas, MD  Aspirin-Caffeine [facility] FAST PAIN RELIEF PO) Take 1-2 packets by mouth daily as needed (headache).    [provider]  mupirocin cream (BACTROBAN) 2 % Apply 1 application topically 2 (two) times daily. 10/05/21   Lannie Fields, PA-C  oxycodone (OXY-IR) 5 MG capsule Take 1 capsule (5 mg total) by mouth every 4 (four) hours as needed. 02/03/21   Jessy Oto, MD  rivaroxaban (XARELTO) 20 MG TABS tablet Take 1 tablet (20 mg total) by mouth daily with supper. 08/21/20   Jessy Oto, MD    Family History Family History  Problem Relation Age of Onset   Congestive Heart Failure Father    Hypertension Mother    Hyperlipidemia Mother    Stroke Mother     Social History Social History   Tobacco Use   Smoking status: Every Day    Packs/day: 1.00    Years: 24.00    Total pack years: 24.00    Types: Cigarettes   Smokeless tobacco: Never   Tobacco comments:    notified primary care provider and CCM RNCM  Vaping Use   Vaping Use: Some days  Substance Use Topics   Alcohol use: Not Currently    Comment: former use - heavy drinker 8 years ago   Drug use: No     Allergies   Morphine and related and Other   Review of Systems Review of Systems  Eyes:  Positive for photophobia, pain, discharge, redness and visual disturbance. Negative for itching.  Respiratory: Negative.    Neurological: Negative.      Physical Exam Triage Vital Signs ED Triage Vitals  Enc Vitals Group     BP 09/15/22 1629 132/85     Pulse Rate 09/15/22 1629 82     Resp 09/15/22 1629 16     Temp 09/15/22 1629 98 F (36.7 C)     Temp Source 09/15/22 1629 Oral     SpO2 09/15/22 1629 98 %     Weight --      Height --      Head  Circumference --      Peak Flow --      Pain Score 09/15/22 1628 3     Pain Loc --      Pain Edu? --      Excl. in [place]? --    No data found.  Updated Vital Signs BP 132/85 (BP Location: Left Arm)   Pulse 82   Temp 98 F (36.7 C) (Oral)   Resp 16   SpO2 98%   Visual Acuity Right Eye Distance:   Left Eye Distance:   Bilateral Distance:    Right Eye Near:   Left Eye Near:    Bilateral Near:  Physical Exam Constitutional:      Appearance: Normal appearance.  HENT:     Head: Normocephalic and atraumatic.  Eyes:     Extraocular Movements: Extraocular movements intact.     Comments: Left upper and lower eyelid swelling.  Bulbar and conjunctival erythema.  Clear eye discharge.  Fluorescein staining was significant for 1 mm corneal abrasion in the 6 o'clock position.  Neurological:     Mental Status: He is alert.      UC Treatments / Results  Labs (all labs ordered are listed, but only abnormal results are displayed) Labs Reviewed - No data to display  EKG   Radiology No results found.  Procedures Procedures (including critical care time)  Medications Ordered in UC Medications - No data to display  Initial Impression / Assessment and Plan / UC Course  I have reviewed the triage vital signs and the nursing notes.  Pertinent labs & imaging results that were available during my care of the patient were reviewed by me and considered in my medical decision making (see chart for details).     1.  Corneal abrasion of the left eye due to contact lens: Fluorescein staining revealed 1 to 2 mm corneal abrasion in the 6 o'clock position. Erythromycin eye ointment Tylenol/Motrin as needed for pain Return to ophthalmology office if symptoms persist or worsens. Final Clinical Impressions(s) / UC Diagnoses   Final diagnoses:  Corneal abrasion of left eye due to contact lens     Discharge Instructions      Please use erythromycin eye ointment 3 times a day If  you have worsening swelling, purulent discharge, blurry vision or double vision please call your ophthalmologist to be evaluated Please follow-up with your ophthalmologist in the next couple of days for reevaluation No contact lens use until you get the okay from your eye doctor. Tylenol or Motrin as needed for pain.   ED Prescriptions     Medication Sig Dispense Auth. Provider   erythromycin ophthalmic ointment Place into the left eye 3 (three) times daily for 5 days. Place a 1/2 inch ribbon of ointment into the lower eyelid. 3.5 g Casimira Sutphin, Myrene Galas, MD      PDMP not reviewed this encounter.   Chase Picket, MD 09/15/22 [phone]

## 2022-09-15 NOTE — Discharge Instructions (Addendum)
Please use erythromycin eye ointment 3 times a day If you have worsening swelling, purulent discharge, blurry vision or double vision please call your ophthalmologist to be evaluated Please follow-up with your ophthalmologist in the next couple of days for reevaluation No contact lens use until you get the okay from your eye doctor. Tylenol or Motrin as needed for pain.

## 2022-09-15 NOTE — ED Triage Notes (Signed)
Pt reports left eye burning and irritation x 3 days. States he believes his contact may be stuck inside his eye

## 2023-05-04 ENCOUNTER — Emergency Department (HOSPITAL_COMMUNITY)
Admission: EM | Admit: 2023-05-04 | Discharge: 2023-05-04 | Disposition: A | Discharge status: Home / Self Care | Payer: BLUE CROSS/BLUE SHIELD | Attending: Emergency Medicine | Admitting: Emergency Medicine

## 2023-05-04 ENCOUNTER — Other Ambulatory Visit: Payer: Self-pay

## 2023-05-04 ENCOUNTER — Encounter (HOSPITAL_COMMUNITY): Payer: Self-pay

## 2023-05-04 ENCOUNTER — Emergency Department (HOSPITAL_COMMUNITY): Payer: BLUE CROSS/BLUE SHIELD

## 2023-05-04 DIAGNOSIS — J168 Pneumonia due to other specified infectious organisms: Secondary | ICD-10-CM | POA: Diagnosis not present

## 2023-05-04 DIAGNOSIS — J189 Pneumonia, unspecified organism: Secondary | ICD-10-CM

## 2023-05-04 DIAGNOSIS — Z7901 Long term (current) use of anticoagulants: Secondary | ICD-10-CM | POA: Diagnosis not present

## 2023-05-04 DIAGNOSIS — R509 Fever, unspecified: Secondary | ICD-10-CM | POA: Diagnosis present

## 2023-05-04 LAB — URINALYSIS, ROUTINE W REFLEX MICROSCOPIC
Bacteria, UA: NONE SEEN
Bilirubin Urine: NEGATIVE
Glucose, UA: NEGATIVE mg/dL
Ketones, ur: 20 mg/dL — AB
Leukocytes,Ua: NEGATIVE
Nitrite: NEGATIVE
Protein, ur: 30 mg/dL — AB
Specific Gravity, Urine: >1.046 — ABNORMAL HIGH (ref 1.005–1.030)
pH: 5 (ref 5.0–8.0)

## 2023-05-04 LAB — CBC WITH DIFFERENTIAL/PLATELET
Abs Immature Granulocytes: 0.04 10*3/uL (ref 0.00–0.07)
Basophils Absolute: 0 10*3/uL (ref 0.0–0.1)
Basophils Relative: 0 %
Eosinophils Absolute: 0 10*3/uL (ref 0.0–0.5)
Eosinophils Relative: 0 %
HCT: 39.4 % (ref 39.0–52.0)
Hemoglobin: 13.4 g/dL (ref 13.0–17.0)
Immature Granulocytes: 0 %
Lymphocytes Relative: 11 %
Lymphs Abs: 1.5 10*3/uL (ref 0.7–4.0)
MCH: 26.8 pg (ref 26.0–34.0)
MCHC: 34 g/dL (ref 30.0–36.0)
MCV: 78.8 fL — ABNORMAL LOW (ref 80.0–100.0)
Monocytes Absolute: 0.6 10*3/uL (ref 0.1–1.0)
Monocytes Relative: 4 %
Neutro Abs: 11.8 10*3/uL — ABNORMAL HIGH (ref 1.7–7.7)
Neutrophils Relative %: 85 %
Platelets: 609 10*3/uL — ABNORMAL HIGH (ref 150–400)
RBC: 5 MIL/uL (ref 4.22–5.81)
RDW: 15.5 % (ref 11.5–15.5)
WBC: 14 10*3/uL — ABNORMAL HIGH (ref 4.0–10.5)
nRBC: 0 % (ref 0.0–0.2)

## 2023-05-04 LAB — COMPREHENSIVE METABOLIC PANEL
ALT: 18 U/L (ref 0–44)
AST: 22 U/L (ref 15–41)
Albumin: 4.1 g/dL (ref 3.5–5.0)
Alkaline Phosphatase: 57 U/L (ref 38–126)
Anion gap: 10 (ref 5–15)
BUN: 15 mg/dL (ref 6–20)
CO2: 25 mmol/L (ref 22–32)
Calcium: 9.5 mg/dL (ref 8.9–10.3)
Chloride: 103 mmol/L (ref 98–111)
Creatinine, Ser: 1.02 mg/dL (ref 0.61–1.24)
GFR, Estimated: >60 mL/min (ref >60)
Glucose, Bld: 131 mg/dL — ABNORMAL HIGH (ref 70–99)
Potassium: 3.5 mmol/L (ref 3.5–5.1)
Sodium: 138 mmol/L (ref 135–145)
Total Bilirubin: 0.6 mg/dL (ref 0.3–1.2)
Total Protein: 8.4 g/dL — ABNORMAL HIGH (ref 6.5–8.1)

## 2023-05-04 LAB — LIPASE, BLOOD: Lipase: 24 U/L (ref 11–51)

## 2023-05-04 MED ORDER — ONDANSETRON 4 MG PO TBDP
4.0000 mg | ORAL_TABLET | Freq: Once | ORAL | Status: AC
  Administered 2023-05-04: 4 mg via ORAL
  Filled 2023-05-04: qty 1

## 2023-05-04 MED ORDER — IOHEXOL 350 MG/ML SOLN
75.0000 mL | Freq: Once | INTRAVENOUS | Status: AC | PRN
  Administered 2023-05-04: 75 mL via INTRAVENOUS

## 2023-05-04 MED ORDER — AMOXICILLIN-POT CLAVULANATE 875-125 MG PO TABS: 1.0000 | ORAL_TABLET | Freq: Two times a day (BID) | ORAL | 0 refills | Status: DC

## 2023-05-04 MED ORDER — AZITHROMYCIN 250 MG PO TABS: 250.0000 mg | ORAL_TABLET | Freq: Every day | ORAL | 0 refills | Status: DC

## 2023-05-04 MED ORDER — SODIUM CHLORIDE 0.9 % IV BOLUS
1000.0000 mL | Freq: Once | INTRAVENOUS | Status: AC
  Administered 2023-05-04: 1000 mL via INTRAVENOUS

## 2023-05-04 NOTE — ED Provider Triage Note (Signed)
Emergency Medicine Provider Triage Evaluation Note  Jason [name] , a 42 y.o. male  was evaluated in triage.  Pt complains of nausea vomiting going on for about 24 to 48 hours.  Also feeling subjectively hot and cold.  Diffuse abdominal discomfort.  No cough or congestion.  No known sick contacts no suspicious food intake..  Review of Systems  Positive: N/v abdominal pain Negative: Cough, congestion, rash  Physical Exam  BP 121/86 (BP Location: Right Arm)   Pulse 75   Temp 99.1 F (37.3 C) (Oral)   Resp 17   SpO2 100%  Gen:   Awake, no distress   Resp:  Normal effort  MSK:   Moves extremities without difficulty  Other:  Benign abdominal exam  Medical Decision Making  Medically screening exam initiated at 11:29 AM.  Appropriate orders placed.  Jason [name] was informed that the remainder of the evaluation will be completed by another provider, this initial triage assessment does not replace that evaluation, and the importance of remaining in the ED until their evaluation is complete.  Patient with what sounds like viral syndrome going on for couple days.  Tells me he can eat or drink.  Was mildly tachycardic on my exam.  Will give a bolus of IV fluids blood work antiemetics.   Melene Plan, DO 05/04/23 1130

## 2023-05-04 NOTE — ED Provider Notes (Signed)
[place] EMERGENCY DEPARTMENT AT [facility] Provider Note   CSN: [id number] Arrival date & time: 05/04/23  1115     History  Chief Complaint  Patient presents with   Generalized Body Aches    Jason [name] is a 42 y.o. male history of GERD, PE on Xarelto, cocaine abuse, sepsis, AKI presented with fever and abdominal pain.  Patient states symptoms began yesterday and.  Patient denies any sick contacts or any changes in his daily life.  Patient denies any recent antibiotics, hospitalizations/travel/new foods/funny tasting food.  Patient dates he had a temperature of 102 yesterday has had 5 episodes of nonbloody emesis.  Patient states he still has his gallbladder and appendix and that his abdomen hurts all around and no specific spot.  Patient states he has not been able to eat or drink since yesterday.  Patient reports 2-3 loose stools yesterday as well.  Patient denied chest pain, shortness of breath, changes sensation/motor skills, LOC, vision changes, dysuria  Home Medications Prior to Admission medications   Medication Sig Start Date End Date Taking? Authorizing Provider  amoxicillin-clavulanate (AUGMENTIN) 875-125 MG tablet Take 1 tablet by mouth every 12 (twelve) hours. 05/04/23  Yes Darian Cansler, Beverly Gust, PA-C  azithromycin (ZITHROMAX) 250 MG tablet Take 1 tablet (250 mg total) by mouth daily. Take first 2 tablets together, then 1 every day until finished. 05/04/23  Yes Varina Hulon, Beverly Gust, PA-C  Aspirin-Caffeine (BC FAST PAIN RELIEF PO) Take 1-2 packets by mouth daily as needed (headache).    [provider]  mupirocin cream (BACTROBAN) 2 % Apply 1 application topically 2 (two) times daily. 10/05/21   Orvil Feil, PA-C  oxycodone (OXY-IR) 5 MG capsule Take 1 capsule (5 mg total) by mouth every 4 (four) hours as needed. 02/03/21   Kerrin Champagne, MD  rivaroxaban (XARELTO) 20 MG TABS tablet Take 1 tablet (20 mg total) by mouth daily with supper. 08/21/20   Kerrin Champagne, MD      Allergies    Morphine and codeine and Other    Review of Systems   Review of Systems See HPI Physical Exam Updated Vital Signs BP (!) 147/84   Pulse (!) 57   Temp 99 F (37.2 C) (Oral)   Resp (!) 25   Ht 5\' 8"  (1.727 m)   Wt 74.8 kg   SpO2 100%   BMI 25.09 kg/m  Physical Exam Vitals reviewed.  Constitutional:      General: He is not in acute distress. HENT:     Head: Normocephalic and atraumatic.  Eyes:     Extraocular Movements: Extraocular movements intact.     Conjunctiva/sclera: Conjunctivae normal.     Pupils: Pupils are equal, round, and reactive to light.  Neck:     Comments: No meningismus noted Cardiovascular:     Rate and Rhythm: Normal rate and regular rhythm.     Pulses: Normal pulses.     Heart sounds: Normal heart sounds.     Comments: 2+ bilateral radial/dorsalis pedis pulses with regular rate Pulmonary:     Effort: Pulmonary effort is normal. No respiratory distress.     Breath sounds: Normal breath sounds.  Abdominal:     Palpations: Abdomen is soft.     Tenderness: There is no abdominal tenderness. There is no guarding or rebound.  Musculoskeletal:        General: Normal range of motion.     Cervical back: Normal range of motion and neck supple.  Comments: 5 out of 5 bilateral grip/leg extension strength  Skin:    General: Skin is warm and dry.     Capillary Refill: Capillary refill takes less than 2 seconds.  Neurological:     General: No focal deficit present.     Mental Status: He is alert and oriented to person, place, and time.     Comments: Sensation intact in all 4 limbs  Psychiatric:        Mood and Affect: Mood normal.     ED Results / Procedures / Treatments   Labs (all labs ordered are listed, but only abnormal results are displayed) Labs Reviewed  CBC WITH DIFFERENTIAL/PLATELET - Abnormal; Notable for the following components:      Result Value   WBC 14.0 (*)    MCV 78.8 (*)    Platelets 609 (*)     Neutro Abs 11.8 (*)    All other components within normal limits  COMPREHENSIVE METABOLIC PANEL - Abnormal; Notable for the following components:   Glucose, Bld 131 (*)    Total Protein 8.4 (*)    All other components within normal limits  URINALYSIS, ROUTINE W REFLEX MICROSCOPIC - Abnormal; Notable for the following components:   Specific Gravity, Urine >1.046 (*)    Hgb urine dipstick MODERATE (*)    Ketones, ur 20 (*)    Protein, ur 30 (*)    All other components within normal limits  LIPASE, BLOOD    EKG None  Radiology CT ABDOMEN PELVIS W CONTRAST  Result Date: 05/04/2023 CLINICAL DATA:  Abdominal pain, acute, nonlocalized reported fever, generalized abd pain EXAM: CT ABDOMEN AND PELVIS WITH CONTRAST TECHNIQUE: Multidetector CT imaging of the abdomen and pelvis was performed using the standard protocol following bolus administration of intravenous contrast. RADIATION DOSE REDUCTION: This exam was performed according to the departmental dose-optimization program which includes automated exposure control, adjustment of the mA and/or kV according to patient size and/or use of iterative reconstruction technique. CONTRAST:  75mL OMNIPAQUE IOHEXOL 350 MG/ML SOLN COMPARISON:  CT scan abdomen and pelvis from 11/09/2019. FINDINGS: Lower chest: There are patchy airspace opacities in the middle lobe and left lower lobe, along with centrilobular lung nodules. There is occlusive filling defect in the left lung lower lobe distal subsegmental bronchi. Findings favor aspiration versus infective pneumonia. Bilateral lungs are otherwise clear. No pleural effusion. The heart is normal in size. No pericardial effusion. Hepatobiliary: The liver is normal in size. Non-cirrhotic configuration. No suspicious mass. No intrahepatic or extrahepatic bile duct dilation. No calcified gallstones. Normal gallbladder wall thickness. No pericholecystic inflammatory changes. Pancreas: Unremarkable. No pancreatic ductal  dilatation or surrounding inflammatory changes. Spleen: Within normal limits. No focal lesion. Adrenals/Urinary Tract: Adrenal glands are unremarkable. No suspicious renal mass. No hydronephrosis. No renal or ureteric calculi. Urinary bladder is partially distended. There is mild diffuse circumferential wall thickening. No focal mass or bladder calculi. No perivesical fat stranding. Stomach/Bowel: No disproportionate dilation of the small or large bowel loops. No evidence of abnormal bowel wall thickening or inflammatory changes. The appendix was not visualized; however there is no acute inflammatory process in the right lower quadrant. Vascular/Lymphatic: No ascites or pneumoperitoneum. No abdominal or pelvic lymphadenopathy, by size criteria. No aneurysmal dilation of the major abdominal arteries. Reproductive: Normal size prostate. Symmetric seminal vesicles. Other: The visualized soft tissues and abdominal wall are unremarkable. Musculoskeletal: No suspicious osseous lesions. IMPRESSION: 1. Patchy airspace opacities in the middle lobe and left lower lobe, along with centrilobular lung  nodules. Occlusive filling defect in the left lung lower lobe distal subsegmental bronchi. Findings favor aspiration versus infective pneumonia. Correlate clinically. 2. There is mild diffuse urinary bladder wall thickening without perivesical fat stranding. Findings may represent chronic versus acute cystitis. Correlate clinically and with urinalysis. 3. Multiple other nonacute observations, as described above. Electronically Signed   By: Jules Schick M.D.   On: 05/04/2023 14:51    Procedures Procedures    Medications Ordered in ED Medications  sodium chloride 0.9 % bolus 1,000 mL (0 mLs Intravenous Stopped 05/04/23 1410)  ondansetron (ZOFRAN-ODT) disintegrating tablet 4 mg (4 mg Oral Given 05/04/23 1301)  iohexol (OMNIPAQUE) 350 MG/ML injection 75 mL (75 mLs Intravenous Contrast Given 05/04/23 1440)    ED Course/  Medical Decision Making/ A&P                             Medical Decision Making Amount and/or Complexity of Data Reviewed Labs: ordered. Radiology: ordered.  Risk Prescription drug management.   AMJAD FIKES 42 y.o. presented today for generalized weakness, fever. Working DDx that I considered at this time includes, but not limited to, pneumonia, UTI, viral illness, appendicitis, cholecystitis, pancreatitis, electrolyte imbalance, dehydration, AKI, anemia, meningitis/encephalitis.  R/o DDx: UTI, viral illness, appendicitis, cholecystitis, pancreatitis, electrolyte imbalance, dehydration, AKI, anemia, meningitis/encephalitis: These are considered less likely due to history of present illness and physical exam findings  Review of prior external notes: 09/15/2022 ED  Unique Tests and My Interpretation:  CBC: Leukocytosis 14.0 CMP: Unremarkable Lipase: Negative UA: Unremarkable CT abdomen pelvis contrast: Pneumonia present along with chronic versus acute cystitis  Discussion with Independent Historian: None  Discussion of Management of Tests: None  Risk: Medium: prescription drug management  Risk Stratification Score: None  Plan: On exam patient was in no acute distress stable vitals.  Patient physical was largely unremarkable and patient was afebrile however patient was endorsing history of 102 degree fever.  Patient is also endorsing generalized abdominal pain with nausea vomiting.  Abdominal labs were drawn along with a CT as patient is endorsing fever and have white count from labs in triage to rule out appendicitis.  Patient be given Zofran and fluids and monitored.  CT came back showing pneumonia which would explain patient's symptoms.  CT also shows acute versus chronic cystitis.  At this time we have not obtained a urine sample and once the patient gives [place] urine sample and we are able to evaluate patient most likely be discharged on antibiotics with outpatient  follow-up.  Patient stable this time.  Patient's urine does not show UTI and so patient be treated for the pneumonia.  Patient will be given a Z-Pak along with Augmentin due to past medical history and encouraged to follow-up with his primary care provider.  Patient was given return precautions. Patient stable for discharge at this time.  Patient verbalized understanding of plan.         Final Clinical Impression(s) / ED Diagnoses Final diagnoses:  Pneumonia due to infectious organism, unspecified laterality, unspecified part of lung    Rx / DC Orders ED Discharge Orders          Ordered    amoxicillin-clavulanate (AUGMENTIN) 875-125 MG tablet  Every 12 hours        05/04/23 1629    azithromycin (ZITHROMAX) 250 MG tablet  Daily        05/04/23 1629  Netta Corrigan, PA-C 05/04/23 1631    Melene Plan, DO 05/04/23 [phone]

## 2023-05-04 NOTE — Discharge Instructions (Addendum)
Please follow-up with your primary care provider regarding recent symptoms and ER visit.  Today your labs and imaging show that you have pneumonia mostly causing your symptoms.  I have prescribed you 2 antibiotics to take for symptoms.  You may also take Tylenol every 6 hours needed for pain.  Over the next 2 days please remain hydrated and eat food as tolerated.  If symptoms change or worsen please return to ER.

## 2023-05-04 NOTE — ED Triage Notes (Signed)
Pt is coming in for generalized body aches that have been ongoing x 2 days. It is accompanied with nausea and a reported fever. Was visited by family member today who gave him 1000mg  of tylenol and 20mg  of oxy to alleviate the fever and pain.   124/68 68hr 16rr 98%ra 243bgl

## 2023-08-03 ENCOUNTER — Emergency Department (HOSPITAL_COMMUNITY): Payer: BLUE CROSS/BLUE SHIELD

## 2023-08-03 ENCOUNTER — Other Ambulatory Visit: Payer: Self-pay

## 2023-08-03 ENCOUNTER — Encounter (HOSPITAL_COMMUNITY): Payer: Self-pay

## 2023-08-03 ENCOUNTER — Emergency Department (HOSPITAL_COMMUNITY)
Admission: EM | Admit: 2023-08-03 | Discharge: 2023-08-04 | Disposition: A | Discharge status: Home / Self Care | Payer: BLUE CROSS/BLUE SHIELD | Attending: Emergency Medicine | Admitting: Emergency Medicine

## 2023-08-03 DIAGNOSIS — R569 Unspecified convulsions: Secondary | ICD-10-CM | POA: Diagnosis not present

## 2023-08-03 DIAGNOSIS — R55 Syncope and collapse: Secondary | ICD-10-CM | POA: Diagnosis present

## 2023-08-03 NOTE — ED Triage Notes (Signed)
Patient BIB [facility] from home after family heard him fall in the bathroom and reported foaming at the mouth and his eyes rolling back in his head. Patient was A&O on EMS arrival, hyperventilating and crying, then reported headache and dizziness. Patient able to ambulate on scene and reports no ETOH or drugs on board. VSS. HR 98 NSR, BP 140/80, RR 26, 96% RA, CBG 108

## 2023-08-03 NOTE — ED Notes (Signed)
Patient transported to CT 

## 2023-08-03 NOTE — ED Provider Notes (Signed)
Emergency Department Provider Note   I have reviewed the triage vital signs and the nursing notes.   HISTORY  Chief Complaint Loss of Consciousness   HPI Jason [name] is a 42 y.o. male with PMH reviewed below including seizure, not on AEDs, presents to the ED with syncope vs seizure episode this evening. Patient recalls being in his room and then next remembers EMS surrounding him at home. Family report patient was found in the bathroom "foaming at the mouth." Patient does not recall being in the bathroom or any preceding symptoms. No CP or SOB. He does have a mild frontal HA. Notes that he has had HA and lightheadedness intermittently for the last several months. He denies any EtOH or drug use. He takes Oxycodone as prescribed. He denies using any drugs this evening. He tells me that he has a history of seizure but is not on medication for them and does not see a Neurologist. His last seizure reported to be several months ago. He reports that he still drives a car. EMS describe some post-ictal behavior shortly afterwards.    Past Medical History:  Diagnosis Date   Anxiety    Back injury    Cervical disc disease    Chronic back pain    Chronic low back pain 03/29/2016   GERD (gastroesophageal reflux disease)    History of kidney stones    per mother- "there was a diagnoses at some point"   Insomnia    Lumbar disc disease    MIGRAINE, COMMON 09/23/2010   Qualifier: Diagnosis of  By: Jonny Ruiz MD, Len Blalock    Pneumonia    Pulmonary embolism (HCC) 11/2018   Sickle cell trait (HCC)     Review of Systems  Constitutional: No fever/chills. Positive unresponsive episode.  Cardiovascular: Denies chest pain. Respiratory: Denies shortness of breath. Gastrointestinal: No abdominal pain.  No nausea, no vomiting.  No diarrhea.  No constipation. Genitourinary: Negative for dysuria. Musculoskeletal: Negative for back pain. Skin: Negative for rash. Neurological: Negative for focal  weakness or numbness. Positive HA.   ____________________________________________   PHYSICAL EXAM:  VITAL SIGNS: ED Triage Vitals  Encounter Vitals Group     BP 08/03/23 2146 108/78     Pulse Rate 08/03/23 2146 89     Resp 08/03/23 2146 16     Temp 08/03/23 2146 98.5 F (36.9 C)     Temp src --      SpO2 08/03/23 2146 100 %   Constitutional: Alert and oriented. Well appearing and in no acute distress. Eyes: Conjunctivae are normal. Head: Atraumatic. Nose: No congestion/rhinnorhea. Mouth/Throat: Mucous membranes are moist.  Neck: No stridor.  Cardiovascular: Normal rate, regular rhythm. Good peripheral circulation. Grossly normal heart sounds.   Respiratory: Normal respiratory effort.  No retractions. Lungs CTAB. Gastrointestinal: Soft and nontender. No distention.  Musculoskeletal: No lower extremity tenderness nor edema. No gross deformities of extremities. Normal ROM of bilateral shoulders.  Neurologic:  Normal speech and language. No gross focal neurologic deficits are appreciated.  Skin:  Skin is warm, dry and intact. No rash noted.  ____________________________________________   LABS (all labs ordered are listed, but only abnormal results are displayed)  Labs Reviewed  COMPREHENSIVE METABOLIC PANEL - Abnormal; Notable for the following components:      Result Value   Glucose, Bld 103 (*)    Total Protein 8.6 (*)    All other components within normal limits  CBC WITH DIFFERENTIAL/PLATELET - Abnormal; Notable for the following components:  Neutro Abs 7.8 (*)    All other components within normal limits  URINALYSIS, W/ REFLEX TO CULTURE (INFECTION SUSPECTED) - Abnormal; Notable for the following components:   Hgb urine dipstick MODERATE (*)    Protein, ur 30 (*)    Bacteria, UA RARE (*)    All other components within normal limits  RAPID URINE DRUG SCREEN, HOSP PERFORMED - Abnormal; Notable for the following components:   Opiates POSITIVE (*)    Benzodiazepines  POSITIVE (*)    All other components within normal limits  MAGNESIUM  ETHANOL   ____________________________________________  EKG   EKG Interpretation Date/Time:  Thursday August 03 2023 21:48:40 EDT Ventricular Rate:  95 PR Interval:  122 QRS Duration:  82 QT Interval:  360 QTC Calculation: 452 R Axis:   68  Text Interpretation: Normal sinus rhythm with sinus arrhythmia Right atrial enlargement Minimal voltage criteria for LVH, may be normal variant ( Sokolow-Lyon ) Borderline ECG When compared with ECG of 14-Nov-2019 10:12, PREVIOUS ECG IS PRESENT Confirmed by Alona Bene [phone]) on 08/03/2023 11:14:43 PM        ____________________________________________  RADIOLOGY  CT Head Wo Contrast  Result Date: 08/04/2023 CLINICAL DATA:  Seizure EXAM: CT HEAD WITHOUT CONTRAST TECHNIQUE: Contiguous axial images were obtained from the base of the skull through the vertex without intravenous contrast. RADIATION DOSE REDUCTION: This exam was performed according to the departmental dose-optimization program which includes automated exposure control, adjustment of the mA and/or kV according to patient size and/or use of iterative reconstruction technique. COMPARISON:  12/09/2019 FINDINGS: Brain: No evidence of acute infarction, hemorrhage, mass, mass effect, or midline shift. No hydrocephalus or extra-axial fluid collection. Vascular: No hyperdense vessel. Skull: Negative for fracture or focal lesion. Sinuses/Orbits: No acute finding. Other: The mastoid air cells are well aerated. IMPRESSION: No acute intracranial process. Electronically Signed   By: Wiliam Ke M.D.   On: 08/04/2023 00:14    ____________________________________________   PROCEDURES  Procedure(s) performed:   Procedures  None  ____________________________________________   INITIAL IMPRESSION / ASSESSMENT AND PLAN / ED COURSE  Pertinent labs & imaging results that were available during my care of the patient were  reviewed by me and considered in my medical decision making (see chart for details).   This patient is Presenting for Evaluation of AMS, which does require a range of treatment options, and is a complaint that involves a high risk of morbidity and mortality.  The Differential Diagnoses includes but is not exclusive to alcohol, illicit or prescription medications, intracranial pathology such as stroke, intracerebral hemorrhage, fever or infectious causes including sepsis, hypoxemia, uremia, trauma, endocrine related disorders such as diabetes, hypoglycemia, thyroid-related diseases, etc.  I did obtain Additional Historical Information from EMS and Mom at bedside.   Clinical Laboratory Tests Ordered, included EtOH negative. No AKI. No leukocytosis or anemia.   Radiologic Tests Ordered, included CT head. I independently interpreted the images and agree with radiology interpretation.   Cardiac Monitor Tracing which shows NSR.    Social Determinants of Health Risk patient is a smoker.   Medical Decision Making: Summary:  Patient presents to the ED with what sounds like seizure this evening. Denies EtOH or drugs. Plan for CT head and labs. Patient awake, alert, and without obvious breakthrough seizure activity. I did specifically advise the patient that he is no longer allowed to drive a vehicle given his likely seizure activity. He verbalizes understanding.   Reevaluation with update and discussion with patient and Mom. Labs  are largely reassuring. Discussed not driving until cleared by Neurology to do so. This was provided in writing in the AVS as well. Neurology referral placed.   Considered admission but patient without a prolonged post-ictal phase and has returned to his baseline. Stable for discharge with Neurology follow up.   Patient's presentation is most consistent with acute presentation with potential threat to life or bodily function.   Disposition:  discharge  ____________________________________________  FINAL CLINICAL IMPRESSION(S) / ED DIAGNOSES  Final diagnoses:  Seizure (HCC)    Note:  This document was prepared using Dragon voice recognition software and may include unintentional dictation errors.  Alona Bene, MD, [facility]    Jamarie Mussa, Arlyss Repress, MD 08/04/23 [phone]

## 2023-08-04 LAB — URINALYSIS, W/ REFLEX TO CULTURE (INFECTION SUSPECTED)
Bilirubin Urine: NEGATIVE
Glucose, UA: NEGATIVE mg/dL
Ketones, ur: NEGATIVE mg/dL
Leukocytes,Ua: NEGATIVE
Nitrite: NEGATIVE
Protein, ur: 30 mg/dL — AB
Specific Gravity, Urine: 1.013 (ref 1.005–1.030)
pH: 5 (ref 5.0–8.0)

## 2023-08-04 LAB — COMPREHENSIVE METABOLIC PANEL
ALT: 15 U/L (ref 0–44)
AST: 20 U/L (ref 15–41)
Albumin: 4.7 g/dL (ref 3.5–5.0)
Alkaline Phosphatase: 58 U/L (ref 38–126)
Anion gap: 13 (ref 5–15)
BUN: 9 mg/dL (ref 6–20)
CO2: 24 mmol/L (ref 22–32)
Calcium: 10.1 mg/dL (ref 8.9–10.3)
Chloride: 99 mmol/L (ref 98–111)
Creatinine, Ser: 1.03 mg/dL (ref 0.61–1.24)
GFR, Estimated: >60 mL/min (ref >60)
Glucose, Bld: 103 mg/dL — ABNORMAL HIGH (ref 70–99)
Potassium: 3.7 mmol/L (ref 3.5–5.1)
Sodium: 136 mmol/L (ref 135–145)
Total Bilirubin: 0.8 mg/dL (ref 0.3–1.2)
Total Protein: 8.6 g/dL — ABNORMAL HIGH (ref 6.5–8.1)

## 2023-08-04 LAB — MAGNESIUM: Magnesium: 2.2 mg/dL (ref 1.7–2.4)

## 2023-08-04 LAB — CBC WITH DIFFERENTIAL/PLATELET
Abs Immature Granulocytes: 0.03 10*3/uL (ref 0.00–0.07)
Basophils Absolute: 0 10*3/uL (ref 0.0–0.1)
Basophils Relative: 0 %
Eosinophils Absolute: 0.1 10*3/uL (ref 0.0–0.5)
Eosinophils Relative: 1 %
HCT: 43.1 % (ref 39.0–52.0)
Hemoglobin: 14.7 g/dL (ref 13.0–17.0)
Immature Granulocytes: 0 %
Lymphocytes Relative: 18 %
Lymphs Abs: 1.9 10*3/uL (ref 0.7–4.0)
MCH: 27.3 pg (ref 26.0–34.0)
MCHC: 34.1 g/dL (ref 30.0–36.0)
MCV: 80 fL (ref 80.0–100.0)
Monocytes Absolute: 0.7 10*3/uL (ref 0.1–1.0)
Monocytes Relative: 7 %
Neutro Abs: 7.8 10*3/uL — ABNORMAL HIGH (ref 1.7–7.7)
Neutrophils Relative %: 74 %
Platelets: 314 10*3/uL (ref 150–400)
RBC: 5.39 MIL/uL (ref 4.22–5.81)
RDW: 15.1 % (ref 11.5–15.5)
WBC: 10.5 10*3/uL (ref 4.0–10.5)
nRBC: 0 % (ref 0.0–0.2)

## 2023-08-04 LAB — RAPID URINE DRUG SCREEN, HOSP PERFORMED
Amphetamines: NOT DETECTED
Barbiturates: NOT DETECTED
Benzodiazepines: POSITIVE — AB
Cocaine: NOT DETECTED
Opiates: POSITIVE — AB
Tetrahydrocannabinol: NOT DETECTED

## 2023-08-04 LAB — ETHANOL: Alcohol, Ethyl (B): <10 mg/dL (ref <10)

## 2023-08-04 NOTE — Discharge Instructions (Signed)
You have been seen in the emergency department today for a likely seizure.  Your workup today including labs are within normal limits.  Please follow up with your doctor as soon as possible regarding today's emergency department visit and your likely seizure.  You will also need to follow up with a neurologist as soon as possible, please call for appointment.  If you have been prescribed a medication for your seizures, please take this medication as prescribed.  As we have discussed it is very important that you DO NOT drive until you have been seen and cleared by your neurologist.  Please drink plenty of fluids, get plenty of sleep and avoid any alcohol or drug use.  Return to the emergency department if you have any further seizures, develop any weakness/numbness of any arm/leg, confusion, slurred speech, or sudden/severe headache.

## 2023-08-07 NOTE — Plan of Care (Signed)
[facility] Tonsillectomy/Adenoidectomy, Postoperative PEDS care plan entered in error.

## 2023-08-28 ENCOUNTER — Ambulatory Visit: Payer: 59 | Admitting: Diagnostic Neuroimaging

## 2023-08-28 ENCOUNTER — Encounter: Payer: Self-pay | Admitting: Neurology

## 2023-08-28 ENCOUNTER — Ambulatory Visit (INDEPENDENT_AMBULATORY_CARE_PROVIDER_SITE_OTHER): Payer: BLUE CROSS/BLUE SHIELD | Admitting: Neurology

## 2023-08-28 VITALS — BP 135/79 | HR 74 | Ht 68.0 in | Wt 151.0 lb

## 2023-08-28 DIAGNOSIS — R569 Unspecified convulsions: Secondary | ICD-10-CM | POA: Diagnosis not present

## 2023-08-28 NOTE — Patient Instructions (Signed)
VISIT SUMMARY:  During your visit, we discussed your recent seizure episode, chronic pain, history of blood clots, head trauma, and visual impairment. We reviewed your current medications and the need for further evaluation and management of your conditions.  YOUR PLAN:  -SEIZURE: A seizure is a sudden, uncontrolled electrical disturbance in the brain. You experienced your first seizure recently, which involved loss of consciousness and convulsions. A CT scan of your head was normal. We will schedule an EEG to further evaluate your condition and may start you on antiepileptic medication if needed.  -CHRONIC PAIN: Chronic pain is persistent pain that lasts for months or years. You have been using Oxycodone purchased off the street for pain management. We recommend that you establish care with a primary care provider to develop a safe and effective pain management plan.  -HISTORY OF BLOOD CLOTS: Blood clots are clumps that occur when blood hardens from a liquid to a solid. You were previously on Xarelto for blood clots but are not currently taking it. This should be discussed with your primary care provider to determine if you need to restart the medication.  -HEAD TRAUMA: Head trauma refers to any injury to the head, including concussions. You have a history of multiple concussions. This should be addressed with your primary care provider to monitor for any long-term effects.  -VISUAL IMPAIRMENT: Visual impairment means that your vision cannot be corrected to a normal level. 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 This should be discussed with your primary care provider to ensure you have the necessary support and resources.  INSTRUCTIONS:  Please follow up with a primary care provider to address your chronic pain, history of blood clots, head trauma, and visual impairment. We will contact you to schedule an EEG for further evaluation of your seizure. If you experience another seizure, seek  immediate medical attention.

## 2023-08-28 NOTE — Progress Notes (Signed)
[facility]  PATIENT: Jason [name] DOB: 1980/12/09  REQUESTING CLINICIAN: Long, Arlyss Repress, MD HISTORY FROM: Patient/Chart review  REASON FOR VISIT: Seizure    HISTORICAL  CHIEF COMPLAINT:  Chief Complaint  Patient presents with   New Patient (Initial Visit)    Rm 12. Patient alone. Patient does not remember but has had one seizure a few weeks ago, but states family didn't say how long it lasted, but was convulsing and foaming at the mouth    HISTORY OF PRESENT ILLNESS:  Discussed the use of AI scribe software for clinical note transcription with the patient, who gave verbal consent to proceed.  History of Present Illness   This is a 42 year old man with PMHx of PE, and chronic pain, who presents for a recent seizure episode. He only remembers waking up to paramedic. He reports that son heard a loud noise and found him on the floor. During the fall, he injured his finger. The event was witnessed by his son and mother, who reported that the patient yelled for help twice before falling and experiencing convulsions. The patient has no recollection of the event and woke up on the couch with EMS and firefighters present. This was the first known seizure episode for the patient, who denies any similar events in the past.  The patient also reported a history of head trauma, specifically multiple concussions. He is legally blind in the right eye. He was previously on Xarelto for blood clots, but is no longer taking it. His current medication regimen includes Oxycodone, which he admitted to purchasing off the street for chronic pain management. He was also prescribed Augmentin, but it is unclear if he is currently taking it. The patient does not have a primary care provider at this time.       Handedness: Right handed   Onset: 08/03/2023  Seizure Type: Generalized convulsion   Current frequency: Only once   Any injuries from seizures: Finger injury    Seizure  risk factors: Couple concussions,   Previous ASMs: None   Currenty ASMs: None   ASMs side effects: N/A  Brain Images: Head CT   Previous EEGs: Not previously done    OTHER MEDICAL CONDITIONS: History PEs, Chronic pain, Sickle cell trait    REVIEW OF SYSTEMS: Full 14 system review of systems performed and negative with exception of: As noted in the HPI  ALLERGIES: Allergies  Allergen Reactions   Morphine And Codeine Swelling   Other Hives and Swelling    Antibiotic that was given when he had pneumonia     HOME MEDICATIONS: Outpatient Medications Prior to Visit  Medication Sig Dispense Refill   oxycodone (OXY-IR) 5 MG capsule Take 1 capsule (5 mg total) by mouth every 4 (four) hours as needed. 30 capsule 0   amoxicillin-clavulanate (AUGMENTIN) 875-125 MG tablet Take 1 tablet by mouth every 12 (twelve) hours. 14 tablet 0   Aspirin-Caffeine (BC FAST PAIN RELIEF PO) Take 1-2 packets by mouth daily as needed (headache).     azithromycin (ZITHROMAX) 250 MG tablet Take 1 tablet (250 mg total) by mouth daily. Take first 2 tablets together, then 1 every day until finished. 6 tablet 0   mupirocin cream (BACTROBAN) 2 % Apply 1 application topically 2 (two) times daily. 15 g 0   rivaroxaban (XARELTO) 20 MG TABS tablet Take 1 tablet (20 mg total) by mouth daily with supper. 90 tablet 3   No facility-administered medications prior to visit.    PAST  MEDICAL HISTORY: Past Medical History:  Diagnosis Date   Anxiety    Back injury    Cervical disc disease    Chronic back pain    Chronic low back pain 03/29/2016   GERD (gastroesophageal reflux disease)    History of kidney stones    per mother- "there was a diagnoses at some point"   Insomnia    Lumbar disc disease    MIGRAINE, COMMON 09/23/2010   Qualifier: Diagnosis of  By: Jonny Ruiz MD, Len Blalock    Pneumonia    Pulmonary embolism (HCC) 11/2018   Sickle cell trait (HCC)     PAST SURGICAL HISTORY: Past Surgical History:  Procedure  Laterality Date   BACK SURGERY     BIOPSY  11/12/2019   Procedure: BIOPSY;  Surgeon: Tressia Danas, MD;  Location: [facility] ENDOSCOPY;  Service: Gastroenterology;;   ESOPHAGOGASTRODUODENOSCOPY (EGD) WITH PROPOFOL N/A 11/12/2019   Procedure: ESOPHAGOGASTRODUODENOSCOPY (EGD) WITH PROPOFOL;  Surgeon: Tressia Danas, MD;  Location: [facility] ENDOSCOPY;  Service: Gastroenterology;  Laterality: N/A;   LUMBAR LAMINECTOMY/DECOMPRESSION MICRODISCECTOMY Bilateral 08/28/2017   Procedure: BILATERAL PARTIAL HEMILAMINECTOMIES L4-5 WITH MICRODISCECTOMY;  Surgeon: Kerrin Champagne, MD;  Location: [facility] OR;  Service: Orthopedics;  Laterality: Bilateral;   POSTERIOR CERVICAL FUSION/FORAMINOTOMY N/A 08/10/2020   Procedure: LEFT C6-7 FORAMINOTOMY;  Surgeon: Kerrin Champagne, MD;  Location: [facility] OR;  Service: Orthopedics;  Laterality: N/A;    FAMILY HISTORY: Family History  Problem Relation Age of Onset   Congestive Heart Failure Father    Hypertension Mother    Hyperlipidemia Mother    Stroke Mother     SOCIAL HISTORY: Social History   Socioeconomic History   Marital status: Single    Spouse name: Not on file   Number of children: 1   Years of education: Not on file   Highest education level: GED or equivalent  Occupational History   Not on file  Tobacco Use   Smoking status: Every Day    Current packs/day: 1.00    Average packs/day: 1 pack/day for 24.0 years (24.0 ttl pk-yrs)    Types: Cigarettes   Smokeless tobacco: Never   Tobacco comments:    notified primary care provider and CCM RNCM  Vaping Use   Vaping status: Some Days  Substance and Sexual Activity   Alcohol use: Not Currently    Comment: former use - heavy drinker 8 years ago   Drug use: No   Sexual activity: Yes  Other Topics Concern   Not on file  Social History Narrative   Not on file   Social Determinants of Health   Financial Resource Strain: High Risk (01/11/2019)   Overall Financial Resource Strain (CARDIA)    Difficulty of Paying  Living Expenses: Very hard  Food Insecurity: No Food Insecurity (01/11/2019)   Hunger Vital Sign    Worried About Running Out of Food in the Last Year: Never true    Ran Out of Food in the Last Year: Never true  Transportation Needs: No Transportation Needs (01/11/2019)   PRAPARE - Administrator, Civil Service (Medical): No    Lack of Transportation (Non-Medical): No  Physical Activity: Not on file  Stress: Stress Concern Present (01/11/2019)   [employer] of Occupational Health - Occupational Stress Questionnaire    Feeling of Stress : To some extent  Social Connections: Not on file  Intimate Partner Violence: Not on file     PHYSICAL EXAM  GENERAL EXAM/CONSTITUTIONAL: Vitals:  Vitals:   08/28/23 1359  08/28/23 1400  BP: (!) 150/78 135/79  Pulse: 80 74  Weight: 151 lb (68.5 kg)   Height: 5\' 8"  (1.727 m)    Body mass index is 22.96 kg/m. Wt Readings from Last 3 Encounters:  08/28/23 151 lb (68.5 kg)  08/04/23 164 lb 14.5 oz (74.8 kg)  05/04/23 165 lb (74.8 kg)   Patient is in no distress; well developed, nourished and groomed; neck is supple  MUSCULOSKELETAL: Gait, strength, tone, movements noted in Neurologic exam below  NEUROLOGIC: MENTAL STATUS:      No data to display         awake, alert, oriented to person, place and time recent and remote memory intact normal attention and concentration language fluent, comprehension intact, naming intact fund of knowledge appropriate  CRANIAL NERVE:  2nd, 3rd, 4th, 6th - Visual fields full to confrontation, extraocular muscles intact, Decrease vision in the right eye, no nystagmus 5th - facial sensation symmetric 7th - facial strength symmetric 8th - hearing intact 9th - palate elevates symmetrically, uvula midline 11th - shoulder shrug symmetric 12th - tongue protrusion midline  MOTOR:  normal bulk and tone, full strength in the BUE, BLE  SENSORY:  normal and symmetric to light  touch  COORDINATION:  finger-nose-finger, fine finger movements normal  GAIT/STATION:  normal     DIAGNOSTIC DATA (LABS, IMAGING, TESTING) - I reviewed patient records, labs, notes, testing and imaging myself where available.  Lab Results  Component Value Date   WBC 10.5 08/04/2023   HGB 14.7 08/04/2023   HCT 43.1 08/04/2023   MCV 80.0 08/04/2023   PLT 314 08/04/2023      Component Value Date/Time   NA 136 08/04/2023 0004   NA 137 06/10/2020 1654   NA 135 (L) 01/13/2014 1418   K 3.7 08/04/2023 0004   K 3.4 (L) 01/13/2014 1418   CL 99 08/04/2023 0004   CL 103 01/13/2014 1418   CO2 24 08/04/2023 0004   CO2 24 01/13/2014 1418   GLUCOSE 103 (H) 08/04/2023 0004   GLUCOSE 142 (H) 01/13/2014 1418   BUN 9 08/04/2023 0004   BUN 6 06/10/2020 1654   BUN 10 01/13/2014 1418   CREATININE 1.03 08/04/2023 0004   CREATININE 1.44 (H) 01/13/2014 1418   CALCIUM 10.1 08/04/2023 0004   CALCIUM 11.2 (H) 01/13/2014 1418   PROT 8.6 (H) 08/04/2023 0004   PROT 6.6 06/10/2020 1654   PROT 10.1 (H) 01/13/2014 1418   ALBUMIN 4.7 08/04/2023 0004   ALBUMIN 4.5 06/10/2020 1654   ALBUMIN 5.3 (H) 01/13/2014 1418   AST 20 08/04/2023 0004   AST 32 01/13/2014 1418   ALT 15 08/04/2023 0004   ALT 36 01/13/2014 1418   ALKPHOS 58 08/04/2023 0004   ALKPHOS 84 01/13/2014 1418   BILITOT 0.8 08/04/2023 0004   BILITOT 0.3 06/10/2020 1654   BILITOT 0.6 01/13/2014 1418   GFRNONAA >60 08/04/2023 0004   GFRNONAA >60 01/13/2014 1418   GFRAA 137 06/10/2020 1654   GFRAA >60 01/13/2014 1418   Lab Results  Component Value Date   CHOL 152 06/10/2020   HDL 54 06/10/2020   LDLCALC 71 06/10/2020   TRIG 159 (H) 06/10/2020   Lab Results  Component Value Date   HGBA1C 6.1 (H) 06/10/2020   No results found for: "VITAMINB12" Lab Results  Component Value Date   TSH 2.664 11/12/2019    Head CT 08/03/2023 No acute intracranial process.     ASSESSMENT AND PLAN  42 y.o. year old  male  with medical  history including history of PE, chronic pain, legally blind right eye who is presenting after his first seizure, described as generalized convulsion.  Patient is adamant that this is his first event, he never had a seizure in the past.  Plan for now is to obtain a routine EEG as the CT scan was normal.  If EEG is positive, we will likely start him on medication, such as Depakote.  This was discussed with patient and he is comfortable with plan.  I also advised set up care with primary physician to address all of his chronic issues.  He voiced understanding.  Return as needed.   1. Seizures (HCC)     Patient Instructions  VISIT SUMMARY:  During your visit, we discussed your recent seizure episode, chronic pain, history of blood clots, head trauma, and visual impairment. We reviewed your current medications and the need for further evaluation and management of your conditions.  YOUR PLAN:  -SEIZURE: A seizure is a sudden, uncontrolled electrical disturbance in the brain. You experienced your first seizure recently, which involved loss of consciousness and convulsions. A CT scan of your head was normal. We will schedule an EEG to further evaluate your condition and may start you on antiepileptic medication if needed.  -CHRONIC PAIN: Chronic pain is persistent pain that lasts for months or years. You have been using Oxycodone purchased off the street for pain management. We recommend that you establish care with a primary care provider to develop a safe and effective pain management plan.  -HISTORY OF BLOOD CLOTS: Blood clots are clumps that occur when blood hardens from a liquid to a solid. You were previously on Xarelto for blood clots but are not currently taking it. This should be discussed with your primary care provider to determine if you need to restart the medication.  -HEAD TRAUMA: Head trauma refers to any injury to the head, including concussions. You have a history of multiple  concussions. This should be addressed with your primary care provider to monitor for any long-term effects.  -VISUAL IMPAIRMENT: Visual impairment means that your vision cannot be corrected to a normal level. 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 This should be discussed with your primary care provider to ensure you have the necessary support and resources.  INSTRUCTIONS:  Please follow up with a primary care provider to address your chronic pain, history of blood clots, head trauma, and visual impairment. We will contact you to schedule an EEG for further evaluation of your seizure. If you experience another seizure, seek immediate medical attention.   Per [facility] statutes, patients with seizures are not allowed to drive until they have been seizure-free for six months.  Other recommendations include using caution when using heavy equipment or power tools. Avoid working on ladders or at heights. Take showers instead of baths.  Do not swim alone.  Ensure the water temperature is not too high on the home water heater. Do not go swimming alone. Do not lock yourself in a room alone (i.e. bathroom). When caring for infants or small children, sit down when holding, feeding, or changing them to minimize risk of injury to the child in the event you have a seizure. Maintain good sleep hygiene. Avoid alcohol.  Also recommend adequate sleep, hydration, good diet and minimize stress.   During the Seizure  - First, ensure adequate ventilation and place patients on the floor on their left side  Loosen clothing around the  neck and ensure the airway is patent. If the patient is clenching the teeth, do not force the mouth open with any object as this can cause severe damage - Remove all items from the surrounding that can be hazardous. The patient may be oblivious to what's happening and may not even know what he or she is doing. If the patient is confused and wandering, either gently guide him/her  away and block access to outside areas - Reassure the individual and be comforting - Call 911. In most cases, the seizure ends before EMS arrives. However, there are cases when seizures may last over 3 to 5 minutes. Or the individual may have developed breathing difficulties or severe injuries. If a pregnant patient or a person with diabetes develops a seizure, it is prudent to call an ambulance. - Finally, if the patient does not regain full consciousness, then call EMS. Most patients will remain confused for about 45 to 90 minutes after a seizure, so you must use judgment in calling for help. - Avoid restraints but make sure the patient is in a bed with padded side rails - Place the individual in a lateral position with the neck slightly flexed; this will help the saliva drain from the mouth and prevent the tongue from falling backward - Remove all nearby furniture and other hazards from the area - Provide verbal assurance as the individual is regaining consciousness - Provide the patient with privacy if possible - Call for help and start treatment as ordered by the caregiver   After the Seizure (Postictal Stage)  After a seizure, most patients experience confusion, fatigue, muscle pain and/or a headache. Thus, one should permit the individual to sleep. For the next few days, reassurance is essential. Being calm and helping reorient the person is also of importance.  Most seizures are painless and end spontaneously. Seizures are not harmful to others but can lead to complications such as stress on the lungs, brain and the heart. Individuals with prior lung problems may develop labored breathing and respiratory distress.     Orders Placed This Encounter  Procedures   EEG adult    No orders of the defined types were placed in this encounter.   Return if symptoms worsen or fail to improve.    Windell Norfolk, MD 08/28/2023, 2:45 PM  [facility] [address] [place], [place] [address] [phone]

## 2023-10-04 ENCOUNTER — Ambulatory Visit (INDEPENDENT_AMBULATORY_CARE_PROVIDER_SITE_OTHER): Payer: BLUE CROSS/BLUE SHIELD | Admitting: Neurology

## 2023-10-04 DIAGNOSIS — R569 Unspecified convulsions: Secondary | ICD-10-CM | POA: Diagnosis not present

## 2023-10-04 NOTE — Procedures (Signed)
   History:  42 year old man with seizure  EEG classification:  Awake and asleep  Duration: 26 minutes   Technical aspects: This EEG study was done with scalp electrodes positioned according to the 10-20 International system of electrode placement. Electrical activity was reviewed with band pass filter of 1-70Hz , sensitivity of 7 uV/mm, display speed of 25mm/sec with a 60Hz  notched filter applied as appropriate. EEG data were recorded continuously and digitally stored.   Description of the recording: The background rhythms of this recording consists of a fairly well modulated medium amplitude background activity of 12 Hz. As the record progresses, the patient initially is in the waking state, but appears to enter the early stage II sleep during the recording, with rudimentary sleep spindles and vertex sharp wave activity seen. During the wakeful state, photic stimulation was performed, and no abnormal responses were seen. Hyperventilation was also performed, no abnormal response seen. No epileptiform discharges seen during this recording. There was no focal slowing.   Abnormality: None   Impression: This is a normal EEG recording in the waking and sleeping state. No evidence of interictal epileptiform discharges. Normal EEGs, however, do not rule out epilepsy.    Windell Norfolk, MD [facility]

## 2023-12-04 ENCOUNTER — Encounter (HOSPITAL_COMMUNITY): Payer: Self-pay

## 2023-12-04 ENCOUNTER — Emergency Department (HOSPITAL_COMMUNITY): Payer: Medicaid Other

## 2023-12-04 ENCOUNTER — Other Ambulatory Visit: Payer: Self-pay

## 2023-12-04 ENCOUNTER — Emergency Department (HOSPITAL_COMMUNITY)
Admission: EM | Admit: 2023-12-04 | Discharge: 2023-12-07 | Disposition: A | Discharge status: Other Acute Inpatient Hospital | Payer: Medicaid Other | Attending: Emergency Medicine | Admitting: Emergency Medicine

## 2023-12-04 DIAGNOSIS — R45850 Homicidal ideations: Secondary | ICD-10-CM | POA: Diagnosis not present

## 2023-12-04 DIAGNOSIS — F1193 Opioid use, unspecified with withdrawal: Secondary | ICD-10-CM | POA: Diagnosis not present

## 2023-12-04 DIAGNOSIS — F1123 Opioid dependence with withdrawal: Secondary | ICD-10-CM | POA: Diagnosis not present

## 2023-12-04 DIAGNOSIS — F11259 Opioid dependence with opioid-induced psychotic disorder, unspecified: Secondary | ICD-10-CM | POA: Insufficient documentation

## 2023-12-04 DIAGNOSIS — R45851 Suicidal ideations: Secondary | ICD-10-CM

## 2023-12-04 DIAGNOSIS — F1994 Other psychoactive substance use, unspecified with psychoactive substance-induced mood disorder: Secondary | ICD-10-CM | POA: Diagnosis not present

## 2023-12-04 DIAGNOSIS — F1120 Opioid dependence, uncomplicated: Secondary | ICD-10-CM | POA: Diagnosis present

## 2023-12-04 DIAGNOSIS — R443 Hallucinations, unspecified: Secondary | ICD-10-CM | POA: Diagnosis present

## 2023-12-04 DIAGNOSIS — R4182 Altered mental status, unspecified: Secondary | ICD-10-CM | POA: Diagnosis not present

## 2023-12-04 DIAGNOSIS — F19950 Other psychoactive substance use, unspecified with psychoactive substance-induced psychotic disorder with delusions: Secondary | ICD-10-CM | POA: Diagnosis not present

## 2023-12-04 DIAGNOSIS — F1110 Opioid abuse, uncomplicated: Secondary | ICD-10-CM | POA: Diagnosis present

## 2023-12-04 LAB — CBC WITH DIFFERENTIAL/PLATELET
Abs Immature Granulocytes: 0.02 10*3/uL (ref 0.00–0.07)
Basophils Absolute: 0.1 10*3/uL (ref 0.0–0.1)
Basophils Relative: 1 %
Eosinophils Absolute: 0 10*3/uL (ref 0.0–0.5)
Eosinophils Relative: 0 %
HCT: 41.9 % (ref 39.0–52.0)
Hemoglobin: 13.9 g/dL (ref 13.0–17.0)
Immature Granulocytes: 0 %
Lymphocytes Relative: 16 %
Lymphs Abs: 1.2 10*3/uL (ref 0.7–4.0)
MCH: 26.8 pg (ref 26.0–34.0)
MCHC: 33.2 g/dL (ref 30.0–36.0)
MCV: 80.9 fL (ref 80.0–100.0)
Monocytes Absolute: 0.5 10*3/uL (ref 0.1–1.0)
Monocytes Relative: 7 %
Neutro Abs: 5.9 10*3/uL (ref 1.7–7.7)
Neutrophils Relative %: 76 %
Platelets: 326 10*3/uL (ref 150–400)
RBC: 5.18 MIL/uL (ref 4.22–5.81)
RDW: 14 % (ref 11.5–15.5)
WBC: 7.7 10*3/uL (ref 4.0–10.5)
nRBC: 0 % (ref 0.0–0.2)

## 2023-12-04 LAB — COMPREHENSIVE METABOLIC PANEL
ALT: 12 U/L (ref 0–44)
AST: 16 U/L (ref 15–41)
Albumin: 4.4 g/dL (ref 3.5–5.0)
Alkaline Phosphatase: 45 U/L (ref 38–126)
Anion gap: 9 (ref 5–15)
BUN: 7 mg/dL (ref 6–20)
CO2: 24 mmol/L (ref 22–32)
Calcium: 9 mg/dL (ref 8.9–10.3)
Chloride: 106 mmol/L (ref 98–111)
Creatinine, Ser: 1.07 mg/dL (ref 0.61–1.24)
GFR, Estimated: >60 mL/min (ref >60)
Glucose, Bld: 129 mg/dL — ABNORMAL HIGH (ref 70–99)
Potassium: 3.4 mmol/L — ABNORMAL LOW (ref 3.5–5.1)
Sodium: 139 mmol/L (ref 135–145)
Total Bilirubin: 1.2 mg/dL (ref 0.0–1.2)
Total Protein: 7.7 g/dL (ref 6.5–8.1)

## 2023-12-04 LAB — ACETAMINOPHEN LEVEL: Acetaminophen (Tylenol), Serum: <10 ug/mL — ABNORMAL LOW (ref 10–30)

## 2023-12-04 LAB — ETHANOL: Alcohol, Ethyl (B): <10 mg/dL (ref <10)

## 2023-12-04 LAB — RAPID URINE DRUG SCREEN, HOSP PERFORMED
Amphetamines: NOT DETECTED
Barbiturates: NOT DETECTED
Benzodiazepines: NOT DETECTED
Cocaine: POSITIVE — AB
Opiates: NOT DETECTED
Tetrahydrocannabinol: NOT DETECTED

## 2023-12-04 LAB — SALICYLATE LEVEL: Salicylate Lvl: <7 mg/dL — ABNORMAL LOW (ref 7.0–30.0)

## 2023-12-04 MED ORDER — ACETAMINOPHEN 500 MG PO TABS
1000.0000 mg | ORAL_TABLET | ORAL | Status: AC
  Administered 2023-12-04: 1000 mg via ORAL
  Filled 2023-12-04: qty 2

## 2023-12-04 MED ORDER — HALOPERIDOL LACTATE 5 MG/ML IJ SOLN
5.0000 mg | Freq: Once | INTRAMUSCULAR | Status: AC
Start: 2023-12-04 — End: 2023-12-04
  Administered 2023-12-04: 5 mg via INTRAMUSCULAR
  Filled 2023-12-04: qty 1

## 2023-12-04 MED ORDER — LORAZEPAM 2 MG/ML IJ SOLN
2.0000 mg | Freq: Once | INTRAMUSCULAR | Status: AC
  Administered 2023-12-04: 2 mg via INTRAMUSCULAR
  Filled 2023-12-04: qty 1

## 2023-12-04 NOTE — ED Notes (Signed)
IVC: CASE # [id number] ORIGINAL IN RED FOLDER 3 COPIES IN PURPLE COPY IN MEDICAL RECORDS

## 2023-12-04 NOTE — ED Provider Notes (Signed)
Patient is exhibiting bizarre behavior.  He was wandering about the hospital and had to be redirected to his room.  Says that he used fentanyl just prior to arrival.  Also has cocaine in his urine drug screen.  Says that he is concerned about parasites within him.  Still says he is having hallucinations and is wondering what is we real and what is not but will not further elaborate.  Says that he is having thoughts of killing himself but does not have a specific plan.  Concerned about the patient potentially harming himself and being psychotic at this time.  Since he is not in his right mind currently I am concerned about his safety and have placed an IVC.   Rondel Baton, MD 12/04/23 1336

## 2023-12-04 NOTE — ED Notes (Signed)
RN explain to the patient mother, that he need to change in a scrub. Per his mother "I do not want him to wear that"

## 2023-12-04 NOTE — ED Provider Notes (Signed)
[place] EMERGENC[facility] DEPARTMENT AT [facility] Provider Note   CSN: [id number] Arrival date & time: 12/04/23  1116     History  Chief Complaint  Patient presents with   Tinnitus   Addiction Problem   Hallucinations    Jason [name] is a 43 y.o. male.  43 year old male with prior medical history as detailed below presents for evaluation.  Patient arrives with EMS transport.  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  He complained of a "popping sensation in his head.  He was seen in triage and felt to be a hazard to himself and others.  Apparently reported suicidal ideation without a specific plan.  Patient may have some degree of psychosis possibly related to his reported drug use.  IVC initiated in ED triage.  The history is provided by the patient and medical records.       Home Medications Prior to Admission medications   Medication Sig Start Date End Date Taking? Authorizing Provider  oxycodone (OXY-IR) 5 MG capsule Take 1 capsule (5 mg total) by mouth every 4 (four) hours as needed. 02/03/21   Kerrin Champagne, MD      Allergies    Morphine and codeine and Other    Review of Systems   Review of Systems  Unable to perform ROS: Mental status change    Physical Exam Updated Vital Signs BP (!) 152/104   Pulse 75   Temp 98.5 F (36.9 C)   Resp 17   Ht 5\' 8"  (1.727 m)   Wt 74.8 kg   SpO2 100%   BMI 25.09 kg/m  Physical Exam Vitals and nursing note reviewed.  Constitutional:      General: He is not in acute distress.    Appearance: He is well-developed.  HENT:     Head: Normocephalic and atraumatic.  Eyes:     Conjunctiva/sclera: Conjunctivae normal.  Cardiovascular:     Rate and Rhythm: Normal rate and regular rhythm.     Heart sounds: No murmur heard. Pulmonary:     Effort: Pulmonary effort is normal. No respiratory distress.     Breath sounds: Normal breath sounds.  Abdominal:     Palpations: Abdomen is soft.      Tenderness: There is no abdominal tenderness.  Musculoskeletal:        General: No swelling.     Cervical back: Neck supple.  Skin:    General: Skin is warm and dry.     Capillary Refill: Capillary refill takes less than 2 seconds.  Neurological:     Mental Status: He is alert.  Psychiatric:        Mood and Affect: Mood normal.     ED Results / Procedures / Treatments   Labs (all labs ordered are listed, but only abnormal results are displayed) Labs Reviewed  COMPREHENSIVE METABOLIC PANEL - Abnormal; Notable for the following components:      Result Value   Potassium 3.4 (*)    Glucose, Bld 129 (*)    All other components within normal limits  RAPID URINE DRUG SCREEN, HOSP PERFORMED - Abnormal; Notable for the following components:   Cocaine POSITIVE (*)    All other components within normal limits  SALICYLATE LEVEL - Abnormal; Notable for the following components:   Salicylate Lvl <7.0 (*)    All other components within normal limits  ACETAMINOPHEN LEVEL - Abnormal; Notable for the following components:   Acetaminophen (Tylenol), Serum <10 (*)  All other components within normal limits  ETHANOL  CBC WITH DIFFERENTIAL/PLATELET    EKG None  Radiology No results found.  Procedures Procedures    Medications Ordered in ED Medications  LORazepam (ATIVAN) injection 2 mg (has no administration in time range)  haloperidol lactate (HALDOL) injection 5 mg (has no administration in time range)  acetaminophen (TYLENOL) tablet 1,000 mg (1,000 mg Oral Given 12/04/23 1246)    ED Course/ Medical Decision Making/ A&P                                 Medical Decision Making Risk Prescription drug management.    Medical Screen Complete  This patient presented to the ED with complaint of substance abuse, abnormal behavior.  This complaint involves an extensive number of treatment options. The initial differential diagnosis includes, but is not limited to, mental health  emergency, psychosis, substance abuse  This presentation is: Acute, Chronic, Self-Limited, Previously Undiagnosed, Uncertain Prognosis, Complicated, Systemic Symptoms, and Threat to Life/Bodily Function  Patient presents with bizarre behaviors and reported thoughts of self-harm.  Patient reports recent substance use and abuse.  IVC hold initiated in ED triage.  Patient without acute medical issue on screening.  Patient would benefit from TTS evaluation for evaluation and final disposition.  Co morbidities that complicated the patient's evaluation  See HPI   Additional history obtained:  External records from outside sources obtained and reviewed including prior ED visits and prior Inpatient records.    Lab Tests:  I ordered and personally interpreted labs.  The pertinent results include:  cbc cmp  etoh acet salicylate urine tox   Imaging Studies ordered:  I ordered imaging studies including ct head  I independently visualized and interpreted obtained imaging which showed NAD I agree with the radiologist interpretation.  Problem List / ED Course:  AMS  Disposition:  After consideration of the diagnostic results and the patients response to treatment, I feel that the patent would benefit from TTS evaluation.          Final Clinical Impression(s) / ED Diagnoses Final diagnoses:  Altered mental status, unspecified altered mental status type    Rx / DC Orders ED Discharge Orders     None         Wynetta Fines, MD 12/04/23 1450

## 2023-12-04 NOTE — ED Triage Notes (Addendum)
PT arrives via EMS. 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 PT reports ringing in both ears since this morning after having a "popping sensation in his head". PT denies headache. He is also concerned he may have tapeworm. Pt arrives AxOx4. He states he did snort some fentanyl over an hour ago.    Pt also endorses visual and auditory hallucinations. Denies si or hi at this time.

## 2023-12-04 NOTE — ED Notes (Signed)
Patient clothes in [place] [address]. Cell phone, [employer] cigarettes, two $20.00 and two $1.00  bills are are in the security office.

## 2023-12-04 NOTE — Consult Note (Signed)
  Attempted to wake patient up for psychiatric assessment. 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 Pt was agitated while in ED and received Haldol 5 mg IM and Lorazepam 2 mg IM at 1445.   Attempted to vigorously wake patient up x2, however he was too sedated, would not open his eyes, or respond to my questions. RN to notify if patient becomes more coherent.   As of now, will plan for next shift to assess if patient becomes able to participate in assessment.

## 2023-12-04 NOTE — ED Notes (Signed)
Pt resting and does not want to talk. Pt only responds with yes or no (head nods)

## 2023-12-04 NOTE — ED Provider Triage Note (Signed)
Emergency Medicine Provider Triage Evaluation Note  Jason [name] , a 43 y.o. male  was evaluated in triage.  Pt complains of concerns about a worm inside of him and head popping. 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 Last night at midnight felt a pop in his head. Says it was uncomfortable but not having pain now. Says that his ears have been popping since. Was watching house where they pulled a tape worm out of someone and he says he's worried that there may be a worm inside of him. Last fentanyl use 1 hour pta. No etoh use in years. No other substances. Denies active SI. No HI. Says he's been having hallucinations but won't further characterize them.   Review of Systems  Positive: See above Negative: N/v/d  Physical Exam  BP (!) 152/104   Pulse 75   Temp 98.5 F (36.9 C)   Resp 17   Ht 5\' 8"  (1.727 m)   Wt 74.8 kg   SpO2 100%   BMI 25.09 kg/m  Gen:   Awake, no distress   Resp:  Normal effort  MSK:   Moves extremities without difficulty  Other:    Medical Decision Making  Medically screening exam initiated at 11:47 AM.  Appropriate orders placed.  Jason [name] was informed that the remainder of the evaluation will be completed by another provider, this initial triage assessment does not replace that evaluation, and the importance of remaining in the ED until their evaluation is complete.   Rondel Baton, MD 12/04/23 1149

## 2023-12-05 DIAGNOSIS — R45851 Suicidal ideations: Secondary | ICD-10-CM | POA: Diagnosis not present

## 2023-12-05 DIAGNOSIS — F19950 Other psychoactive substance use, unspecified with psychoactive substance-induced psychotic disorder with delusions: Secondary | ICD-10-CM

## 2023-12-05 DIAGNOSIS — F1994 Other psychoactive substance use, unspecified with psychoactive substance-induced mood disorder: Secondary | ICD-10-CM | POA: Diagnosis not present

## 2023-12-05 DIAGNOSIS — R45850 Homicidal ideations: Secondary | ICD-10-CM

## 2023-12-05 DIAGNOSIS — F1193 Opioid use, unspecified with withdrawal: Secondary | ICD-10-CM | POA: Diagnosis not present

## 2023-12-05 LAB — RESP PANEL BY RT-PCR (RSV, FLU A&B, COVID)  RVPGX2
Influenza A by PCR: NEGATIVE
Influenza B by PCR: NEGATIVE
Resp Syncytial Virus by PCR: NEGATIVE
SARS Coronavirus 2 by RT PCR: NEGATIVE

## 2023-12-05 MED ORDER — BUPRENORPHINE HCL-NALOXONE HCL 8-2 MG SL SUBL
1.0000 | SUBLINGUAL_TABLET | Freq: Two times a day (BID) | SUBLINGUAL | Status: DC
Start: 2023-12-05 — End: 2023-12-05

## 2023-12-05 MED ORDER — HYDROXYZINE HCL 25 MG PO TABS
25.0000 mg | ORAL_TABLET | Freq: Three times a day (TID) | ORAL | Status: DC | PRN
  Administered 2023-12-05 – 2023-12-06 (×2): 25 mg via ORAL
  Filled 2023-12-05 (×2): qty 1

## 2023-12-05 MED ORDER — BUPRENORPHINE HCL-NALOXONE HCL 8-2 MG SL SUBL
1.0000 | SUBLINGUAL_TABLET | Freq: Three times a day (TID) | SUBLINGUAL | Status: DC | PRN
  Administered 2023-12-05 – 2023-12-06 (×2): 1 via SUBLINGUAL
  Filled 2023-12-05 (×2): qty 1

## 2023-12-05 MED ORDER — OLANZAPINE 5 MG PO TABS
5.0000 mg | ORAL_TABLET | Freq: Every day | ORAL | Status: DC
  Administered 2023-12-05 – 2023-12-06 (×2): 5 mg via ORAL
  Filled 2023-12-05 (×2): qty 1

## 2023-12-05 MED ORDER — BUPRENORPHINE HCL-NALOXONE HCL 8-2 MG SL SUBL
1.0000 | SUBLINGUAL_TABLET | Freq: Three times a day (TID) | SUBLINGUAL | Status: DC | PRN
Start: 2023-12-05 — End: 2023-12-05

## 2023-12-05 MED ORDER — PROMETHAZINE HCL 25 MG/ML IJ SOLN
12.5000 mg | Freq: Four times a day (QID) | INTRAMUSCULAR | Status: DC | PRN
Start: 2023-12-05 — End: 2023-12-07
  Administered 2023-12-05 – 2023-12-06 (×2): 12.5 mg via INTRAMUSCULAR
  Filled 2023-12-05 (×2): qty 1

## 2023-12-05 NOTE — ED Notes (Signed)
PT is rolling around in the bed at this time.

## 2023-12-05 NOTE — ED Notes (Signed)
PT is balled up on the bed in the fetal position at this time.

## 2023-12-05 NOTE — ED Notes (Signed)
1st attempt made to call in report. Receiving RN did not answer phone. Will make another attempt

## 2023-12-05 NOTE — Progress Notes (Signed)
Pt has been accepted to [facility] on 12/05/2023 Bed assignment: 800 UNIT   Pt meets inpatient criteria per: Norval Morton NP  Attending Physician will be: Sherrian Divers MD  Report can be called to: [phone]  Pt can arrive after ASAP   Care Team Notified: Ophelia Shoulder NP, Meta Hatchet RN    [place] Darcell Yacoub LCSW-A   12/05/2023 2:46 PM

## 2023-12-05 NOTE — ED Notes (Signed)
PT was found in bed, incontinent of bowel.PT was placed in the shower room where he has had 3 runny bowel movements. PT is sitting on bath chair and just letting the water run on him, when I asked him to start washing up, he said "just get"

## 2023-12-05 NOTE — ED Notes (Signed)
[facility] transport team responded, will pick PT up tomorrow.

## 2023-12-05 NOTE — ED Notes (Signed)
PT states that his nausea has subsided and he has not had anymore diarrhea.

## 2023-12-05 NOTE — ED Notes (Signed)
PT sat up in the bed and started projectile vomiting. Messick (physician) was notified and Phenergan was administered.

## 2023-12-05 NOTE — Progress Notes (Signed)
LCSW Progress Note  [id number]   Jason [name]  12/05/2023  1:11 PM  Description:   Inpatient Psychiatric Referral  Patient was recommended inpatient per [facility] NP. There are no available beds at [facility], per [facility] Rona Ravens RN). Patient was referred to the following out of network facilities:   Destination  Service Provider Address Phone Fax  [facility] [address], [place] [address] [phone]  [facility] [address] [place] [address] [phone]  [facility] [address] [place], [place] [address] [phone]  [facility] [address] [place] [address] [phone]  [facility] [address], [place] [address] [phone]  [facility] [address] [place] [address] [phone]  [facility] [address] [place] [address] [phone]  [facility] [address] [place] [address] [phone]  [facility] EFAX [address], [place] [phone]  [facility] [address], [place] [address] [phone]  [facility] [address] [place] [address] [phone]  [facility] [address] [place] [address] [phone]      Situation ongoing, CSW to continue following and update chart as more information becomes available.      [name], MSW, LCSW  12/05/2023 1:11 PM

## 2023-12-05 NOTE — Consult Note (Addendum)
 [facility] Consult Note  Patient Name: Jason [name] MRN: [id number] DOB: 07/09/1981 DATE OF Consult: 12/05/2023  PRIMARY PSYCHIATRIC DIAGNOSES  1.   Suicidal Ideation  2.  Homicidal ideations 3.  Substance induced psychosis    RECOMMENDATIONS  Admit to psych for safety and stabilization Medication recommendations: start Zyprexa 5mg  PO QHS for psychosis. Non-Medication/therapeutic recommendations: Refer to outpatient psychiatric provider for medication management, therapy, and substance abuse treatment upon discharge from inpatient psych admission,  Communication: Treatment team members (and family members if applicable) who were involved in treatment/care discussions and planning, and with whom we spoke or engaged with via secure text/chat, include the following:  patient's treatment team.  Thank you for involving [place] in the care of this patient. If you have any additional questions or concerns, please call [phone] and ask for me or the provider on-call.  TELEPSYCHIATRY ATTESTATION & CONSENT  As the provider for this telehealth consult, I attest that I verified the patient's identity using two separate identifiers, introduced myself to the patient, provided my credentials, disclosed my location, and performed this encounter via a HIPAA-compliant, real-time, face-to-face, two-way, interactive audio and video platform and with the full consent and agreement of the patient (or guardian as applicable.)  Patient physical location: [facility]. Telehealth provider physical location: home office in state of [place].  Video start time: 2350 (Central Time) Video end time: 0005 (Central Time)  IDENTIFYING DATA  Jason [name] is a 43 y.o. year-old male for whom a psychiatric consultation has been ordered by the primary provider. The patient was identified using two separate identifiers.  CHIEF COMPLAINT/REASON FOR CONSULT  - Thought he had a tapeworm - Trying to detox from  Fentanyl - Having thoughts of wanting to kill himself  HISTORY OF PRESENT ILLNESS (HPI)  The patient, a 43 year old male, presented with a complex history of substance use and psychiatric symptoms. He initially reported a belief that he had a tapeworm, which he attributed to a video he saw while attempting to self-detox. It was revealed that he had been using fentanyl, administered via injection, although the exact quantity was unknown. Despite his assertion of using only fentanyl, there was an indication of cocaine presence in his system, which he could not confirm.  During the evaluation, the patient expressed thoughts of self-harm and a desire to harm others, although he was more definitive about wanting to harm himself. He lives with his mother, who patient gave consent to be contacted to provide additional context. She confirmed a history of one mental health admission when patient was having a difficult time after his brother passed away.  There was no clear family history of mental illness reported by the mother. The patient was observed to be in a state of psychosis, exhibiting bizarre behaviors and fluctuating consciousness, necessitating the administration of medication, specifically Haldol and Lorazepam, to manage his acute symptoms. It was recommended that he be admitted to a psychiatric unit for further evaluation and stabilization, given the severity of his current mental state and the risk of harm to himself and potentially others.   PAST PSYCHIATRIC HISTORY  - one admission to mental health hospital after brother passed away - Thoughts of wanting to kill himself - Medicated with Haldol and Lorazepam for bizarre behaviors  PAST MEDICAL HISTORY  Past Medical History:  Diagnosis Date   Anxiety    Back injury    Cervical disc disease    Chronic back pain    Chronic low back pain  03/29/2016   GERD (gastroesophageal reflux disease)    History of kidney stones    per mother- "there was a  diagnoses at some point"   Insomnia    Lumbar disc disease    MIGRAINE, COMMON 09/23/2010   Qualifier: Diagnosis of  By: Jonny Ruiz MD, Len Blalock    Pneumonia    Pulmonary embolism (HCC) 11/2018   Sickle cell trait (HCC)      HOME MEDICATIONS  Facility Ordered Medications  Medication   [COMPLETED] acetaminophen (TYLENOL) tablet 1,000 mg   [COMPLETED] LORazepam (ATIVAN) injection 2 mg   [COMPLETED] haloperidol lactate (HALDOL) injection 5 mg     ALLERGIES  Allergies  Allergen Reactions   Morphine And Codeine Swelling   Other Hives and Swelling    Antibiotic that was given when he had pneumonia     SOCIAL & SUBSTANCE USE HISTORY  Social History   Socioeconomic History   Marital status: Single    Spouse name: Not on file   Number of children: 1   Years of education: Not on file   Highest education level: GED or equivalent  Occupational History   Not on file  Tobacco Use   Smoking status: Every Day    Current packs/day: 1.00    Average packs/day: 1 pack/day for 24.0 years (24.0 ttl pk-yrs)    Types: Cigarettes   Smokeless tobacco: Never   Tobacco comments:    notified primary care provider and CCM RNCM  Vaping Use   Vaping status: Some Days  Substance and Sexual Activity   Alcohol use: Not Currently    Comment: former use - heavy drinker 8 years ago   Drug use: Yes    Comment: Fentanyl about 1 hour ago   Sexual activity: Yes  Other Topics Concern   Not on file  Social History Narrative   Not on file   Social Drivers of Health   Financial Resource Strain: High Risk (01/11/2019)   Overall Financial Resource Strain (CARDIA)    Difficulty of Paying Living Expenses: Very hard  Food Insecurity: No Food Insecurity (01/11/2019)   Hunger Vital Sign    Worried About Running Out of Food in the Last Year: Never true    Ran Out of Food in the Last Year: Never true  Transportation Needs: No Transportation Needs (01/11/2019)   PRAPARE - Administrator, Civil Service  (Medical): No    Lack of Transportation (Non-Medical): No  Physical Activity: Not on file  Stress: Stress Concern Present (01/11/2019)   [employer] of Occupational Health - Occupational Stress Questionnaire    Feeling of Stress : To some extent  Social Connections: Not on file   Social History   Tobacco Use  Smoking Status Every Day   Current packs/day: 1.00   Average packs/day: 1 pack/day for 24.0 years (24.0 ttl pk-yrs)   Types: Cigarettes  Smokeless Tobacco Never  Tobacco Comments   notified primary care provider and CCM RNCM   Social History   Substance and Sexual Activity  Alcohol Use Not Currently   Comment: former use - heavy drinker 8 years ago   Social History   Substance and Sexual Activity  Drug Use Yes   Comment: Fentanyl about 1 hour ago    Additional pertinent information .  FAMILY HISTORY  Family History  Problem Relation Age of Onset   Congestive Heart Failure Father    Hypertension Mother    Hyperlipidemia Mother    Stroke Mother  Family Psychiatric History (if known):  mother denies  MENTAL STATUS EXAM (MSE)  Mental Status Exam: General Appearance:  laying in bed covered with a blanket  Orientation:  oriented to person and place  Memory:  Immediate;   Fair Recent;   Fair Remote;   Poor  Concentration:  Concentration: Poor and Attention Span: Poor  Recall:  Poor  Attention  Poor  Eye Contact:  Poor  Speech:  Slow  Language:  Fair  Volume:  Decreased  Mood: His mood was not explicitly stated, but the overall sentiment suggested a low mood.  Affect:  appeared subdued, and the patient expressed a desire to harm himself and others, indicating a significant level of distress  Thought Process:  was not clearly linear and goal-directed due to the patient's intermittent alertness.  Thought Content:  Delusions  Suicidal Thoughts:  Yes.  without intent/plan  Homicidal Thoughts:  Yes.  without intent/plan  Judgement:  Impaired  Insight:   Lacking  Psychomotor Activity:  Negative  Akathisia:  Negative  Fund of Knowledge:  Poor    Assets:  Housing Social Support  Cognition:    ADL's:  unable to assess  AIMS (if indicated):       VITALS  Blood pressure 120/74, pulse 65, temperature 98.4 F (36.9 C), temperature source Oral, resp. rate 15, height 5\' 8"  (1.727 m), weight 74.8 kg, SpO2 100%.  LABS  Admission on 12/04/2023  Component Date Value Ref Range Status   Sodium 12/04/2023 139  135 - 145 mmol/L Final   Potassium 12/04/2023 3.4 (L)  3.5 - 5.1 mmol/L Final   Chloride 12/04/2023 106  98 - 111 mmol/L Final   CO2 12/04/2023 24  22 - 32 mmol/L Final   Glucose, Bld 12/04/2023 129 (H)  70 - 99 mg/dL Final   Glucose reference range applies only to samples taken after fasting for at least 8 hours.   BUN 12/04/2023 7  6 - 20 mg/dL Final   Creatinine, Ser 12/04/2023 1.07  0.61 - 1.24 mg/dL Final   Calcium 19/14/7829 9.0  8.9 - 10.3 mg/dL Final   Total Protein 56/21/3086 7.7  6.5 - 8.1 g/dL Final   Albumin 57/84/6962 4.4  3.5 - 5.0 g/dL Final   AST 95/28/4132 16  15 - 41 U/L Final   ALT 12/04/2023 12  0 - 44 U/L Final   Alkaline Phosphatase 12/04/2023 45  38 - 126 U/L Final   Total Bilirubin 12/04/2023 1.2  0.0 - 1.2 mg/dL Final   GFR, Estimated 12/04/2023 >60  >60 mL/min Final   Comment: (NOTE) Calculated using the CKD-EPI Creatinine Equation (2021)    Anion gap 12/04/2023 9  5 - 15 Final   Performed at [facility], [address] [place], [place] [address]   Alcohol, Ethyl (B) 12/04/2023 <10  <10 mg/dL Final   Comment: (NOTE) Lowest detectable limit for serum alcohol is 10 mg/dL.  For medical purposes only. Performed at [facility], [address], [place], [place] [address]    Opiates 12/04/2023 NONE DETECTED  NONE DETECTED Final   Cocaine 12/04/2023 POSITIVE (A)  NONE DETECTED Final   Benzodiazepines 12/04/2023 NONE DETECTED  NONE DETECTED Final   Amphetamines 12/04/2023 NONE DETECTED  NONE  DETECTED Final   Tetrahydrocannabinol 12/04/2023 NONE DETECTED  NONE DETECTED Final   Barbiturates 12/04/2023 NONE DETECTED  NONE DETECTED Final   Comment: (NOTE) DRUG SCREEN FOR MEDICAL PURPOSES ONLY.  IF CONFIRMATION IS NEEDED FOR ANY PURPOSE,  NOTIFY LAB WITHIN 5 DAYS.  LOWEST DETECTABLE LIMITS FOR URINE DRUG SCREEN Drug Class                     Cutoff (ng/mL) Amphetamine and metabolites    1000 Barbiturate and metabolites    200 Benzodiazepine                 200 Opiates and metabolites        300 Cocaine and metabolites        300 THC                            50 Performed at [facility], [address], [place], [place] [address]    WBC 12/04/2023 7.7  4.0 - 10.5 K/uL Final   RBC 12/04/2023 5.18  4.22 - 5.81 MIL/uL Final   Hemoglobin 12/04/2023 13.9  13.0 - 17.0 g/dL Final   HCT 62/13/0865 41.9  39.0 - 52.0 % Final   MCV 12/04/2023 80.9  80.0 - 100.0 fL Final   MCH 12/04/2023 26.8  26.0 - 34.0 pg Final   MCHC 12/04/2023 33.2  30.0 - 36.0 g/dL Final   RDW 78/46/9629 14.0  11.5 - 15.5 % Final   Platelets 12/04/2023 326  150 - 400 K/uL Final   nRBC 12/04/2023 0.0  0.0 - 0.2 % Final   Neutrophils Relative % 12/04/2023 76  % Final   Neutro Abs 12/04/2023 5.9  1.7 - 7.7 K/uL Final   Lymphocytes Relative 12/04/2023 16  % Final   Lymphs Abs 12/04/2023 1.2  0.7 - 4.0 K/uL Final   Monocytes Relative 12/04/2023 7  % Final   Monocytes Absolute 12/04/2023 0.5  0.1 - 1.0 K/uL Final   Eosinophils Relative 12/04/2023 0  % Final   Eosinophils Absolute 12/04/2023 0.0  0.0 - 0.5 K/uL Final   Basophils Relative 12/04/2023 1  % Final   Basophils Absolute 12/04/2023 0.1  0.0 - 0.1 K/uL Final   Immature Granulocytes 12/04/2023 0  % Final   Abs Immature Granulocytes 12/04/2023 0.02  0.00 - 0.07 K/uL Final   Performed at [facility], [address], [place], [place] [address]   Salicylate Lvl 12/04/2023 <7.0 (L)  7.0 - 30.0 mg/dL Final   Performed at [facility],  [address] [place], [place] [address]   Acetaminophen (Tylenol), Serum 12/04/2023 <10 (L)  10 - 30 ug/mL Final   Comment: (NOTE) Therapeutic concentrations vary significantly. A range of 10-30 ug/mL  may be an effective concentration for many patients. However, some  are best treated at concentrations outside of this range. Acetaminophen concentrations >150 ug/mL at 4 hours after ingestion  and >50 ug/mL at 12 hours after ingestion are often associated with  toxic reactions.  Performed at [facility], [address], [place], [place] [address]     PSYCHIATRIC REVIEW OF SYSTEMS (ROS)  ROS: Notable for the following relevant positive findings: ROS  Additional findings:      Musculoskeletal: No abnormal movements observed      Gait & Station: Laying/Sitting      Pain Screening: Denies      Nutrition & Dental Concerns: none reported  RISK FORMULATION/ASSESSMENT  Is the patient experiencing any suicidal or homicidal ideations: Yes       Explain if yes: reports being homicidal and suicidal Protective factors considered for safety management: supportive mother, access  to appropriate clinical services  Risk factors/concerns considered for safety management:  Depression Substance abuse/dependence Male gender Unmarried  Is there a safety management plan with the patient and treatment team to minimize risk factors and promote protective factors: Yes           Explain: admit to psych Is crisis care placement or psychiatric hospitalization recommended: Yes     Based on my current evaluation and risk assessment, patient is determined at this time to be at:  Moderate Risk  *RISK ASSESSMENT Risk assessment is a dynamic process; it is possible that this patient's condition, and risk level, may change. This should be re-evaluated and managed over time as appropriate. Please re-consult psychiatric consult services if additional assistance is needed in terms of risk assessment and  management. If your team decides to discharge this patient, please advise the patient how to best access emergency psychiatric services, or to call 911, if their condition worsens or they feel unsafe in any way.   Norval Morton, NP Telepsychiatry Consult Services

## 2023-12-05 NOTE — ED Provider Notes (Signed)
Emergency Medicine Observation Re-evaluation Note  Jason [name] is a 43 y.o. male, seen on rounds today.  Pt initially presented to the ED for complaints of Tinnitus, Addiction Problem, and Hallucinations Currently, the patient is resting comfortably.  Physical Exam  BP 132/85 (BP Location: Left Arm)   Pulse 65   Temp 98.7 F (37.1 C) (Oral)   Resp 20   Ht 5\' 8"  (1.727 m)   Wt 74.8 kg   SpO2 100%   BMI 25.09 kg/m  Physical Exam General: NAD  ED Course / MDM  EKG:EKG Interpretation Date/Time:  Monday December 04 2023 12:44:22 EST Ventricular Rate:  75 PR Interval:  114 QRS Duration:  86 QT Interval:  408 QTC Calculation: 455 R Axis:   65  Text Interpretation: Normal sinus rhythm Right atrial enlargement Minimal voltage criteria for LVH, may be normal variant ( Sokolow-Lyon ) Borderline ECG When compared with ECG of 03-Aug-2023 21:48, PREVIOUS ECG IS PRESENT Confirmed by Kristine Royal [phone]) on 12/04/2023 2:30:48 PM  I have reviewed the labs performed to date as well as medications administered while in observation.  Recent changes in the last 24 hours include no acute events reported.  Plan  Current plan is for psychiatric admission.    Wynetta Fines, MD 12/05/23 [phone]

## 2023-12-05 NOTE — ED Notes (Signed)
PT is watching television, I asked the PT if he was in any kind of pain or needed to use the restroom, PT just gave me a thumbs up.

## 2023-12-05 NOTE — ED Notes (Signed)
PT is sleeping

## 2023-12-05 NOTE — ED Notes (Signed)
A representative from [facility] called to get information about PT.

## 2023-12-05 NOTE — ED Notes (Signed)
Pt seen by telepsych and plan is to admit

## 2023-12-05 NOTE — ED Notes (Signed)
Pt c/o of joint pain and had n/v prior to RN arrival this shift; EDP notified that patient may be experiencing opiate withdrawals; RN to continue to monitor; Pt is A&O x 4 at this time-Monique,RN

## 2023-12-06 ENCOUNTER — Encounter (HOSPITAL_COMMUNITY): Payer: Self-pay | Admitting: Psychiatry

## 2023-12-06 DIAGNOSIS — F1110 Opioid abuse, uncomplicated: Secondary | ICD-10-CM | POA: Diagnosis present

## 2023-12-06 DIAGNOSIS — F1994 Other psychoactive substance use, unspecified with psychoactive substance-induced mood disorder: Secondary | ICD-10-CM

## 2023-12-06 DIAGNOSIS — F1193 Opioid use, unspecified with withdrawal: Secondary | ICD-10-CM | POA: Diagnosis not present

## 2023-12-06 MED ORDER — METHOCARBAMOL 500 MG PO TABS: 500.0000 mg | ORAL_TABLET | Freq: Three times a day (TID) | ORAL | Status: DC | PRN

## 2023-12-06 MED ORDER — LOPERAMIDE HCL 2 MG PO CAPS: 2.0000 mg | ORAL_CAPSULE | ORAL | Status: DC | PRN

## 2023-12-06 MED ORDER — TRAZODONE HCL 50 MG PO TABS
50.0000 mg | ORAL_TABLET | Freq: Every evening | ORAL | Status: DC | PRN
  Administered 2023-12-06: 50 mg via ORAL
  Filled 2023-12-06: qty 1

## 2023-12-06 MED ORDER — DICYCLOMINE HCL 20 MG PO TABS
20.0000 mg | ORAL_TABLET | Freq: Four times a day (QID) | ORAL | Status: DC | PRN
  Administered 2023-12-06: 20 mg via ORAL
  Filled 2023-12-06: qty 1

## 2023-12-06 MED ORDER — ESCITALOPRAM OXALATE 10 MG PO TABS
5.0000 mg | ORAL_TABLET | Freq: Every day | ORAL | Status: DC
  Administered 2023-12-06 – 2023-12-07 (×2): 5 mg via ORAL
  Filled 2023-12-06 (×2): qty 1

## 2023-12-06 MED ORDER — NAPROXEN 250 MG PO TABS: 500.0000 mg | ORAL_TABLET | Freq: Two times a day (BID) | ORAL | Status: DC | PRN

## 2023-12-06 NOTE — ED Notes (Signed)
Pt had two episodes of vomiting, states ate a little of his dinner tray.

## 2023-12-06 NOTE — Consult Note (Signed)
[facility] Consult Follow-up  Patient Name: .Jason [name]  MRN: [id number]  DOB: 1981/01/09  Consult Order details:  Orders (From admission, onward)     Start     Ordered   12/04/23 1451  CONSULT TO CALL ACT TEAM       Ordering Provider: Wynetta Fines, MD  Provider:  (Not yet assigned)  Question:  Reason for Consult?  Answer:  Psych consult   12/04/23 1450             Mode of Visit: In person    Psychiatry Consult Evaluation  Service Date: December 06, 2023 LOS:  LOS: 0 days  Chief Complaint: - Thought he had a tapeworm - Trying to detox from Fentanyl - Having thoughts of wanting to kill himself  Primary Psychiatric Diagnoses  Substance-induced mood disorder 2.  Opiate use disorder, severe, dependence 3.  Opiate withdrawal 4.  Substance-induced psychosis (initial, now resolved)  Assessment   Jason [name] is a 43 y.o. AA male with a past psychiatric history of substance-induced psychosis, polysubstance abuse, ETOH abuse, MDD, and unspecified anxiety, with pertinent medical comorbidities/history that include degenerative disc disease, seizures, cervical disc disorder with radiculopathy, sepsis, pneumonia, cellulitis, who presented this encounter by way of EMS with endorsements of detoxing off fentanyl, concerns for having a tapeworm, and decompensation of his mental health in the form of experiencing auditory/visual hallucinations, homicidal ideations, and suicidal ideations, thus psychiatry was consulted.  Given patient's presentation, patient was placed under involuntary commitment, and currently, remains under involuntary commitment, but is medically cleared, per EDP team.  Upon reevaluation, patient endorses he is doing much better physically, but continues to have symptomology consistent with active opiate withdraw, given his severe opiate use disorder.  Patient additionally continues to endorse instability in his mental health in the form of  depression around the loss of his brother many years ago around this time, as well as passive thoughts of suicide, though notably no longer having active thoughts.  Patient additionally upon reevaluation seems to no longer be presenting with psychotic features, no endorsements of auditory and or visual hallucinations, and/or objective signs or symptoms of experiencing decompensation into psychosis.  Given reevaluation today, recommendation remains for inpatient mental health hospitalization at this time, for safety and stability of the patient. Patient has been accepted to [facility], awaiting transportation by way of Sheriff's department.  Discussed with patient starting low-dose of Lexapro to help with depressive symptomology, to which patient endorsed he was amenable to this, in addition to olanzapine.  Diagnoses:  Active Hospital problems: Principal Problem:   Substance induced mood disorder (HCC) Active Problems:   Opioid use disorder, severe, dependence (HCC)   Opiate withdrawal (HCC)    Plan   ## Psychiatric Recommendations:  -Continue olanzapine 5 mg p.o. nightly -Start Lexapro 5 mg p.o. daily -Continue COWS protocols -Start trazodone 50 mg p.o. nightly as needed  ## Medical Decision Making Capacity: Has capacity  ## Further Work-up: None at this time  ## Disposition:-- We recommend inpatient psychiatric hospitalization after medical hospitalization. Patient has been involuntarily committed on 12/04/2023.   ## Behavioral / Environmental: -Routine safety/agitation precautions    ## Safety and Observation Level:  - Based on my clinical evaluation, I estimate the patient to be at low risk of self harm in the current setting. - At this time, we recommend  1:1 Observation. This decision is based on my review of the chart including patient's history and current presentation, interview of  the patient, mental status examination, and consideration of suicide risk including  evaluating suicidal ideation, plan, intent, suicidal or self-harm behaviors, risk factors, and protective factors. This judgment is based on our ability to directly address suicide risk, implement suicide prevention strategies, and develop a safety plan while the patient is in the clinical setting. Please contact our team if there is a concern that risk level has changed.  CSSR Risk Category:C-SSRS RISK CATEGORY: No Risk  Suicide Risk Assessment: Patient has following modifiable risk factors for suicide: untreated depression, recklessness, and lack of access to outpatient mental health resources, which we are addressing by treatment recommendations. Patient has following non-modifiable or demographic risk factors for suicide: male gender, history of suicide attempt, history of self harm behavior, and psychiatric hospitalization Patient has the following protective factors against suicide: Supportive family  Thank you for this consult request. Recommendations have been communicated to the primary team.  We will continue to follow at this time.   Lenox Ponds, NP       History of Present Illness   Jason [name] is a 43 y.o. AA male with a past psychiatric history of substance-induced psychosis, polysubstance abuse, MDD, ETOH abuse, and unspecified anxiety, with pertinent medical comorbidities/history that include degenerative disc disease, seizures, cervical disc disorder with radiculopathy, sepsis, pneumonia, cellulitis, who presented this encounter by way of EMS with endorsements of detoxing off fentanyl, concerns for having a tapeworm, and decompensation of his mental health in the form of experiencing auditory/visual hallucinations, homicidal ideations, and suicidal ideations, thus psychiatry was consulted.  Given patient's presentation, patient was placed under involuntary commitment, and currently, remains under involuntary commitment, but is medically cleared, per EDP team.  Patient  seen today at the [facility] emergency department for face-to-face psychiatric reevaluation.  Upon reevaluation, patient endorses that he is overall doing better, but continues to have some mild opiate withdrawal symptoms, as well as continues to feel depressed about the passing of his brother around this time, and have passive thoughts of suicide.  Patient endorses that he was able to get some sleep over the night, despite withdrawal symptoms being experienced.  Patient endorses he has not been able to eat very much, but is able to eat some, "here and there."  Patient orientation is intact, no concerns for fluctuations in consciousness.  Patient endorses no homicidal ideations.  Patient denies auditory and or visual hallucinations, and objectively, does not appear to be presenting with psychotic features.  Patient endorses that he is admittingly having drug cravings for fentanyl and cravings for tobacco.  Expanding on patient's use history, patient states that he has been using fentanyl and heavy amounts for about 10 years now almost daily, in addition to smoking significant amounts of tobacco daily.  Patient endorses good toleration of medication started by psychiatry team, no appreciable EPS symptomology endorsed, and/or appreciated upon physical assessment.  Discussed with patient that recommendation remains for inpatient mental health hospitalization, for safety and stabilization of the patient, to which patient endorsed he was amenable to this.  Patient asked for this provider to speak to his mom to see if potentially a plan could be put together for safe discharge, advised the patient that this provider would call his mom for collateral information, but plan likely remains for inpatient mental health hospitalization.  Collateral, patient's mother, Ms. Winnifred, spoken to at [phone]  Call placed and extensive conversation held with the patient's mother.  Patient's mother reports that she is  severely concerned about  her son and the depression he is experiencing, in addition to his severe substance abuse of fentanyl.  Patient's mother reports that safety plan, could, "possibly" be put in place, but that she would feel more comfortable for her son safety if he was to get inpatient help that he needs.  Discussed with the patient's mother that given our conversation, as well as the conversation this Clinical research associate had with the patient, recommendation will remain for inpatient mental health hospitalization.  Review of Systems  Constitutional:  Positive for chills and malaise/fatigue.  Gastrointestinal:  Positive for nausea and vomiting.  Musculoskeletal:  Positive for myalgias.  Neurological:  Negative for tremors, seizures, loss of consciousness and headaches.  Psychiatric/Behavioral:  Positive for depression, substance abuse (Fentanyl) and suicidal ideas (Passive thoughts). Negative for hallucinations. The patient is not nervous/anxious and does not have insomnia.   All other systems reviewed and are negative.    Psychiatric and Social History  Psychiatric History:  Information collected from chart review/patient/patient's mother  Prev Dx/Sx: As above; substance-induced psychosis, polysubstance abuse, MDD, ETOH abuse, and unspecified anxiety Current Psych Provider: None reported Home Meds (current): None reported Previous Med Trials: Gabapentin Therapy: [facility] 2021  Prior Psych Hospitalization: Yes, 2021, [facility] Prior Self Harm: Yes, attempted suicide, 2021  Prior Violence: None reported  Family Psych History: None reported Family Hx suicide: None reported  Social History:  Developmental Hx: WDL Educational Hx: High school Occupational Hx: None reported Legal Hx: None reported Living Situation: Lives with mom Spiritual Hx: None reported  Access to weapons/lethal means: None endorsed  Substance History Alcohol: History of alcohol abuse Type of alcohol: liquor/beer Last Drink: none  recently Number of drinks per day: none recently History of alcohol withdrawal seizures: none endorsed History of DT's : None endorsed Tobacco: Daily Illicit drugs: Fentanyl Prescription drug abuse: Prescription fentanyl Rehab hx: None endorsed  Exam Findings  Physical Exam: As below Vital Signs:  Temp:  [98.7 F (37.1 C)-99.3 F (37.4 C)] 98.9 F (37.2 C) (02/19 1500) Pulse Rate:  [75-108] 76 (02/19 1500) Resp:  [16-20] 16 (02/19 1500) BP: (135-154)/(81-111) 142/90 (02/19 1500) SpO2:  [99 %-100 %] 99 % (02/19 1500) Blood pressure (!) 142/90, pulse 76, temperature 98.9 F (37.2 C), temperature source Oral, resp. rate 16, height 5\' 8"  (1.727 m), weight 74.8 kg, SpO2 99%. Body mass index is 25.09 kg/m.  Physical Exam Vitals and nursing note reviewed.  Constitutional:      General: He is not in acute distress.    Appearance: He is normal weight. He is not ill-appearing, toxic-appearing or diaphoretic.  Pulmonary:     Effort: Pulmonary effort is normal.  Skin:    General: Skin is warm and dry.  Neurological:     Mental Status: He is alert and oriented to person, place, and time.     Motor: No tremor or seizure activity.  Psychiatric:        Attention and Perception: Attention and perception normal. He does not perceive auditory or visual hallucinations.        Mood and Affect: Affect normal. Mood is depressed.        Speech: Speech normal.        Behavior: Behavior is not agitated, slowed, aggressive, withdrawn, hyperactive or combative. Behavior is cooperative.        Thought Content: Thought content is not paranoid or delusional. Thought content includes suicidal ideation. Thought content does not include homicidal ideation. Thought content does not include suicidal plan.  Cognition and Memory: Cognition and memory normal.        Judgment: Judgment normal.     Mental Status Exam: General Appearance: Casual  Orientation:  Full (Time, Place, and Person)  Memory:   Immediate;   Fair Recent;   Fair Remote;   Fair  Concentration:  Concentration: Fair and Attention Span: Fair  Recall:  Fair  Attention  Fair  Eye Contact:  Good  Speech:  Clear and Coherent and Normal Rate  Language:  Fair  Volume:  Normal  Mood: "Depressed, but better"  Affect:   Mostly euthymic sad edge  Thought Process:  Coherent, Goal Directed, and Linear  Thought Content:  Logical  Suicidal Thoughts:  Yes.  without intent/plan  Homicidal Thoughts:  No  Judgement:  Intact  Insight:  Present  Psychomotor Activity:  Normal  Akathisia:  No  Fund of Knowledge:  Fair      Assets:  Manufacturing systems engineer Desire for Improvement Financial Resources/Insurance Housing Leisure Time Physical Health Resilience Social Support Talents/Skills Transportation Vocational/Educational  Cognition:  WNL  ADL's:  Intact  AIMS (if indicated):   0     Other History   These have been pulled in through the EMR, reviewed, and updated if appropriate.  Family History:  [name] family history includes Congestive Heart Failure in his father; Hyperlipidemia in his mother; Hypertension in his mother; Stroke in his mother.  Medical History: Past Medical History:  Diagnosis Date   Anxiety    Back injury    Cervical disc disease    Chronic back pain    Chronic low back pain 03/29/2016   GERD (gastroesophageal reflux disease)    History of kidney stones    per mother- "there was a diagnoses at some point"   Insomnia    Lumbar disc disease    MIGRAINE, COMMON 09/23/2010   Qualifier: Diagnosis of  By: Jonny Ruiz MD, Len Blalock    Pneumonia    Pulmonary embolism (HCC) 11/2018   Sickle cell trait (HCC)     Surgical History: Past Surgical History:  Procedure Laterality Date   BACK SURGERY     BIOPSY  11/12/2019   Procedure: BIOPSY;  Surgeon: Tressia Danas, MD;  Location: [facility] ENDOSCOPY;  Service: Gastroenterology;;   ESOPHAGOGASTRODUODENOSCOPY (EGD) WITH PROPOFOL N/A 11/12/2019   Procedure:  ESOPHAGOGASTRODUODENOSCOPY (EGD) WITH PROPOFOL;  Surgeon: Tressia Danas, MD;  Location: [facility] ENDOSCOPY;  Service: Gastroenterology;  Laterality: N/A;   LUMBAR LAMINECTOMY/DECOMPRESSION MICRODISCECTOMY Bilateral 08/28/2017   Procedure: BILATERAL PARTIAL HEMILAMINECTOMIES L4-5 WITH MICRODISCECTOMY;  Surgeon: Kerrin Champagne, MD;  Location: [facility] OR;  Service: Orthopedics;  Laterality: Bilateral;   POSTERIOR CERVICAL FUSION/FORAMINOTOMY N/A 08/10/2020   Procedure: LEFT C6-7 FORAMINOTOMY;  Surgeon: Kerrin Champagne, MD;  Location: [facility] OR;  Service: Orthopedics;  Laterality: N/A;     Medications:   Current Facility-Administered Medications:    buprenorphine-naloxone (SUBOXONE) 8-2 mg per SL tablet 1 tablet, 1 tablet, Sublingual, TID PRN, Terald Sleeper, MD, 1 tablet at 12/05/23 2155   hydrOXYzine (ATARAX) tablet 25 mg, 25 mg, Oral, TID PRN, Ophelia Shoulder E, NP, 25 mg at 12/05/23 2155   OLANZapine (ZYPREXA) tablet 5 mg, 5 mg, Oral, QHS, Ophelia Shoulder E, NP, 5 mg at 12/05/23 2156   promethazine (PHENERGAN) injection 12.5 mg, 12.5 mg, Intramuscular, Q6H PRN, Wynetta Fines, MD, 12.5 mg at 12/05/23 1644 No current outpatient medications on file.  Allergies: Allergies  Allergen Reactions   Morphine And Codeine Swelling   Other Hives and Swelling  Antibiotic that was given when he had pneumonia     Lenox Ponds, NP

## 2023-12-06 NOTE — ED Notes (Signed)
Pt reporting nausea, emesis event x1. RN to request PO nausea medication.

## 2023-12-06 NOTE — ED Provider Notes (Signed)
Emergency Medicine Observation Re-evaluation Note  JEFERSON BOOZER is a 43 y.o. male, seen on rounds today.  Pt initially presented to the ED for complaints of Tinnitus, Addiction Problem, and Hallucinations Currently, the patient is resting in room.  Physical Exam  BP (!) 154/111 (BP Location: Right Arm)   Pulse 75   Temp 99.3 F (37.4 C) (Oral)   Resp 17   Ht 5\' 8"  (1.727 m)   Wt 74.8 kg   SpO2 99%   BMI 25.09 kg/m  Physical Exam General: resting comfortably, NAD Lungs: normal WOB Psych: currently calm and resting  ED Course / MDM  EKG:EKG Interpretation Date/Time:  Monday December 04 2023 12:44:22 EST Ventricular Rate:  75 PR Interval:  114 QRS Duration:  86 QT Interval:  408 QTC Calculation: 455 R Axis:   65  Text Interpretation: Normal sinus rhythm Right atrial enlargement Minimal voltage criteria for LVH, may be normal variant ( Sokolow-Lyon ) Borderline ECG When compared with ECG of 03-Aug-2023 21:48, PREVIOUS ECG IS PRESENT Confirmed by Kristine Royal [phone]) on 12/04/2023 2:30:48 PM  I have reviewed the labs performed to date as well as medications administered while in observation.  Recent changes in the last 24 hours include none.  Plan  Current plan is for psychiatric placement, patient has been accepted to [facility], we are pending transportation and communication with facility.    Rozelle Logan, DO 12/06/23 1120

## 2023-12-06 NOTE — ED Notes (Signed)
RN attempted 2nd call for report to receiving facility. Awaiting call back at this time

## 2023-12-06 NOTE — ED Notes (Signed)
This RN attempted to give report to receiving facility. Receiving RN was unavailable at this time. This RN left contact information for that nurse to give me a call back for report. Will follow up if no call received in timely manner.

## 2023-12-06 NOTE — ED Notes (Signed)
RN called facility again, left number to call back

## 2023-12-06 NOTE — ED Notes (Signed)
RN attempted to call report, no answer to RN at this time. Will attempt again

## 2023-12-07 MED ORDER — ONDANSETRON 4 MG PO TBDP
4.0000 mg | ORAL_TABLET | Freq: Three times a day (TID) | ORAL | Status: DC | PRN
  Administered 2023-12-07: 4 mg via ORAL
  Filled 2023-12-07: qty 1

## 2023-12-07 NOTE — ED Notes (Signed)
Called [place] x 2 for report

## 2023-12-07 NOTE — ED Provider Notes (Signed)
Emergency Medicine Observation Re-evaluation Note  Jason [name] is a 43 y.o. male, seen on rounds today.  Pt initially presented to the ED for complaints of Tinnitus, Addiction Problem, and Hallucinations Currently, the patient is asleep in bed, no new concern by nursing staff.  Physical Exam  BP (!) 148/109 (BP Location: Left Arm)   Pulse 94   Temp 99 F (37.2 C) (Oral)   Resp 18   Ht 5\' 8"  (1.727 m)   Wt 74.8 kg   SpO2 100%   BMI 25.09 kg/m  Physical Exam General: Asleep, no acute distress Cardiac: Regular rate Lungs: no increased WOB Psych: calm, asleep  ED Course / MDM  EKG:EKG Interpretation Date/Time:  Monday December 04 2023 12:44:22 EST Ventricular Rate:  75 PR Interval:  114 QRS Duration:  86 QT Interval:  408 QTC Calculation: 455 R Axis:   65  Text Interpretation: Normal sinus rhythm Right atrial enlargement Minimal voltage criteria for LVH, may be normal variant ( Sokolow-Lyon ) Borderline ECG When compared with ECG of 03-Aug-2023 21:48, PREVIOUS ECG IS PRESENT Confirmed by Kristine Royal [phone]) on 12/04/2023 2:30:48 PM  I have reviewed the labs performed to date as well as medications administered while in observation.  Recent changes in the last 24 hours include patient remains medically cleared. He was recommended for inpatient psych and has been accepted to [facility] by Dr. Sherrian Divers.  Plan  Current plan is for admission to [facility], likely transport today.    Rexford Maus, DO 12/07/23 [phone]

## 2023-12-07 NOTE — ED Notes (Signed)
Updated pt's mom who is at bedside.

## 2023-12-07 NOTE — ED Notes (Signed)
Attempted to call updated report to [facility]. Staff left this RN's number for call back

## 2023-12-07 NOTE — ED Notes (Signed)
Called [place] regarding report. Per staff at [facility], please call report when transport arrives to pick up pt. Per staff, pt still has a bed with them.

## 2023-12-07 NOTE — ED Notes (Signed)
Sheriff called at 8:09 to request update on pick up for [facility]. Deputy stated no eta and will call RN when ready as they "just walked in".

## 2023-12-07 NOTE — ED Notes (Signed)
Pt denies need of anything at this time

## 2023-12-07 NOTE — ED Notes (Signed)
Called [place] to give report x 1

## 2023-12-07 NOTE — ED Notes (Signed)
Pt ambulatory w/ steady gait, VSS, A&Ox4, calm, and cooperative at time of departure. All of pt belongings and paperwork given to [facility].

## 2023-12-07 NOTE — ED Notes (Signed)
Notified [employer] operational officer to please call sheriff and find out about transport status to [facility]

## 2023-12-08 NOTE — ED Notes (Signed)
Pt called family to notify of departure.

## 2024-05-22 ENCOUNTER — Encounter (HOSPITAL_COMMUNITY): Payer: Self-pay | Admitting: Emergency Medicine

## 2024-05-22 ENCOUNTER — Inpatient Hospital Stay (HOSPITAL_COMMUNITY)
Admission: EM | Admit: 2024-05-22 | Discharge: 2024-05-26 | DRG: 871 | Disposition: A | Discharge status: Home / Self Care | Attending: Internal Medicine | Admitting: Internal Medicine

## 2024-05-22 ENCOUNTER — Emergency Department (HOSPITAL_COMMUNITY)

## 2024-05-22 ENCOUNTER — Other Ambulatory Visit: Payer: Self-pay

## 2024-05-22 DIAGNOSIS — D573 Sickle-cell trait: Secondary | ICD-10-CM | POA: Diagnosis present

## 2024-05-22 DIAGNOSIS — R9431 Abnormal electrocardiogram [ECG] [EKG]: Secondary | ICD-10-CM | POA: Diagnosis present

## 2024-05-22 DIAGNOSIS — Z86711 Personal history of pulmonary embolism: Secondary | ICD-10-CM

## 2024-05-22 DIAGNOSIS — K529 Noninfective gastroenteritis and colitis, unspecified: Secondary | ICD-10-CM | POA: Diagnosis present

## 2024-05-22 DIAGNOSIS — F1490 Cocaine use, unspecified, uncomplicated: Secondary | ICD-10-CM | POA: Diagnosis present

## 2024-05-22 DIAGNOSIS — Z1152 Encounter for screening for COVID-19: Secondary | ICD-10-CM

## 2024-05-22 DIAGNOSIS — F17290 Nicotine dependence, other tobacco product, uncomplicated: Secondary | ICD-10-CM | POA: Diagnosis present

## 2024-05-22 DIAGNOSIS — R6520 Severe sepsis without septic shock: Secondary | ICD-10-CM | POA: Diagnosis present

## 2024-05-22 DIAGNOSIS — G8929 Other chronic pain: Secondary | ICD-10-CM | POA: Diagnosis present

## 2024-05-22 DIAGNOSIS — Z823 Family history of stroke: Secondary | ICD-10-CM

## 2024-05-22 DIAGNOSIS — E8809 Other disorders of plasma-protein metabolism, not elsewhere classified: Secondary | ICD-10-CM | POA: Diagnosis present

## 2024-05-22 DIAGNOSIS — K5289 Other specified noninfective gastroenteritis and colitis: Secondary | ICD-10-CM | POA: Diagnosis present

## 2024-05-22 DIAGNOSIS — R509 Fever, unspecified: Secondary | ICD-10-CM | POA: Diagnosis present

## 2024-05-22 DIAGNOSIS — Z8249 Family history of ischemic heart disease and other diseases of the circulatory system: Secondary | ICD-10-CM

## 2024-05-22 DIAGNOSIS — A419 Sepsis, unspecified organism: Principal | ICD-10-CM | POA: Diagnosis present

## 2024-05-22 DIAGNOSIS — E872 Acidosis, unspecified: Secondary | ICD-10-CM | POA: Diagnosis present

## 2024-05-22 DIAGNOSIS — R6521 Severe sepsis with septic shock: Secondary | ICD-10-CM | POA: Diagnosis present

## 2024-05-22 DIAGNOSIS — I4581 Long QT syndrome: Secondary | ICD-10-CM | POA: Diagnosis present

## 2024-05-22 DIAGNOSIS — Z83438 Family history of other disorder of lipoprotein metabolism and other lipidemia: Secondary | ICD-10-CM

## 2024-05-22 DIAGNOSIS — Z720 Tobacco use: Secondary | ICD-10-CM | POA: Diagnosis present

## 2024-05-22 DIAGNOSIS — F17210 Nicotine dependence, cigarettes, uncomplicated: Secondary | ICD-10-CM | POA: Diagnosis present

## 2024-05-22 DIAGNOSIS — A09 Infectious gastroenteritis and colitis, unspecified: Secondary | ICD-10-CM | POA: Diagnosis present

## 2024-05-22 DIAGNOSIS — E876 Hypokalemia: Secondary | ICD-10-CM | POA: Diagnosis present

## 2024-05-22 DIAGNOSIS — Z86718 Personal history of other venous thrombosis and embolism: Secondary | ICD-10-CM

## 2024-05-22 DIAGNOSIS — Z91148 Patient's other noncompliance with medication regimen for other reason: Secondary | ICD-10-CM

## 2024-05-22 DIAGNOSIS — D6859 Other primary thrombophilia: Secondary | ICD-10-CM | POA: Diagnosis present

## 2024-05-22 DIAGNOSIS — K219 Gastro-esophageal reflux disease without esophagitis: Secondary | ICD-10-CM | POA: Diagnosis present

## 2024-05-22 LAB — COMPREHENSIVE METABOLIC PANEL WITH GFR
ALT: 14 U/L (ref 0–44)
AST: 19 U/L (ref 15–41)
Albumin: 3.7 g/dL (ref 3.5–5.0)
Alkaline Phosphatase: 47 U/L (ref 38–126)
Anion gap: 16 — ABNORMAL HIGH (ref 5–15)
BUN: 13 mg/dL (ref 6–20)
CO2: 22 mmol/L (ref 22–32)
Calcium: 9.1 mg/dL (ref 8.9–10.3)
Chloride: 103 mmol/L (ref 98–111)
Creatinine, Ser: 1.11 mg/dL (ref 0.61–1.24)
GFR, Estimated: >60 mL/min (ref >60)
Glucose, Bld: 128 mg/dL — ABNORMAL HIGH (ref 70–99)
Potassium: 3.5 mmol/L (ref 3.5–5.1)
Sodium: 141 mmol/L (ref 135–145)
Total Bilirubin: 0.9 mg/dL (ref 0.0–1.2)
Total Protein: 6.8 g/dL (ref 6.5–8.1)

## 2024-05-22 LAB — URINALYSIS, W/ REFLEX TO CULTURE (INFECTION SUSPECTED)
Bacteria, UA: NONE SEEN
Bilirubin Urine: NEGATIVE
Glucose, UA: NEGATIVE mg/dL
Ketones, ur: 20 mg/dL — AB
Leukocytes,Ua: NEGATIVE
Nitrite: NEGATIVE
Protein, ur: 100 mg/dL — AB
Specific Gravity, Urine: 1.023 (ref 1.005–1.030)
pH: 5 (ref 5.0–8.0)

## 2024-05-22 LAB — CBC WITH DIFFERENTIAL/PLATELET
Abs Immature Granulocytes: 0.04 K/uL (ref 0.00–0.07)
Basophils Absolute: 0 K/uL (ref 0.0–0.1)
Basophils Relative: 0 %
Eosinophils Absolute: 0 K/uL (ref 0.0–0.5)
Eosinophils Relative: 0 %
HCT: 38.1 % — ABNORMAL LOW (ref 39.0–52.0)
Hemoglobin: 12.9 g/dL — ABNORMAL LOW (ref 13.0–17.0)
Immature Granulocytes: 0 %
Lymphocytes Relative: 8 %
Lymphs Abs: 1 K/uL (ref 0.7–4.0)
MCH: 26.4 pg (ref 26.0–34.0)
MCHC: 33.9 g/dL (ref 30.0–36.0)
MCV: 77.9 fL — ABNORMAL LOW (ref 80.0–100.0)
Monocytes Absolute: 0.6 K/uL (ref 0.1–1.0)
Monocytes Relative: 5 %
Neutro Abs: 10.7 K/uL — ABNORMAL HIGH (ref 1.7–7.7)
Neutrophils Relative %: 87 %
Platelets: 319 K/uL (ref 150–400)
RBC: 4.89 MIL/uL (ref 4.22–5.81)
RDW: 14.6 % (ref 11.5–15.5)
WBC: 12.3 K/uL — ABNORMAL HIGH (ref 4.0–10.5)
nRBC: 0 % (ref 0.0–0.2)

## 2024-05-22 LAB — RESP PANEL BY RT-PCR (RSV, FLU A&B, COVID)  RVPGX2
Influenza A by PCR: NEGATIVE
Influenza B by PCR: NEGATIVE
Resp Syncytial Virus by PCR: NEGATIVE
SARS Coronavirus 2 by RT PCR: NEGATIVE

## 2024-05-22 LAB — PROTIME-INR
INR: 1.2 (ref 0.8–1.2)
Prothrombin Time: 15.6 s — ABNORMAL HIGH (ref 11.4–15.2)

## 2024-05-22 LAB — I-STAT CG4 LACTIC ACID, ED: Lactic Acid, Venous: 4.5 mmol/L (ref 0.5–1.9)

## 2024-05-22 MED ORDER — LACTATED RINGERS IV BOLUS (SEPSIS)
1000.0000 mL | Freq: Once | INTRAVENOUS | Status: AC
  Administered 2024-05-22: 1000 mL via INTRAVENOUS

## 2024-05-22 MED ORDER — SODIUM CHLORIDE 0.9 % IV SOLN
2.0000 g | Freq: Once | INTRAVENOUS | Status: AC
  Administered 2024-05-22: 2 g via INTRAVENOUS
  Filled 2024-05-22: qty 12.5

## 2024-05-22 MED ORDER — VANCOMYCIN HCL IN DEXTROSE 1-5 GM/200ML-% IV SOLN
1000.0000 mg | Freq: Once | INTRAVENOUS | Status: AC
  Administered 2024-05-22: 1000 mg via INTRAVENOUS
  Filled 2024-05-22: qty 200

## 2024-05-22 MED ORDER — LACTATED RINGERS IV BOLUS (SEPSIS)
500.0000 mL | Freq: Once | INTRAVENOUS | Status: AC
  Administered 2024-05-22: 500 mL via INTRAVENOUS

## 2024-05-22 MED ORDER — METRONIDAZOLE 500 MG/100ML IV SOLN
500.0000 mg | Freq: Once | INTRAVENOUS | Status: AC
  Administered 2024-05-22: 500 mg via INTRAVENOUS
  Filled 2024-05-22: qty 100

## 2024-05-22 MED ORDER — ACETAMINOPHEN 500 MG PO TABS
1000.0000 mg | ORAL_TABLET | Freq: Once | ORAL | Status: AC
  Administered 2024-05-22: 1000 mg via ORAL
  Filled 2024-05-22: qty 2

## 2024-05-22 MED ORDER — ONDANSETRON HCL 4 MG/2ML IJ SOLN
4.0000 mg | Freq: Once | INTRAMUSCULAR | Status: AC
  Administered 2024-05-22: 4 mg via INTRAVENOUS
  Filled 2024-05-22: qty 2

## 2024-05-22 MED ORDER — IOHEXOL 350 MG/ML SOLN
75.0000 mL | Freq: Once | INTRAVENOUS | Status: AC | PRN
  Administered 2024-05-22: 75 mL via INTRAVENOUS

## 2024-05-22 MED ORDER — LACTATED RINGERS IV SOLN: INTRAVENOUS | Status: AC

## 2024-05-22 NOTE — ED Triage Notes (Signed)
 Pt arrive by [facility] from home for c/o fever and vomiting since yesterday, pt very lethargic on arrival and diaphoretic. Per EMS pt is been incontinent of urina. 500 mL LR, 8 mg Zofran  and 15 mg Toradol  given by EMS pta to ED.

## 2024-05-22 NOTE — ED Provider Notes (Addendum)
 [place] EMERGENCY DEPARTMENT AT [facility] Provider Note  CSN: [id number] Arrival date & time: 05/22/24 2112  Chief Complaint(s) Fever and Emesis  HPI Jason [name] is a 43 y.o. male who is here today for fever and altered mental status.  Patient reports abdominal pain, pain with urination and urinary frequency.  Patient has a history of cystitis, prior pneumonia.  Mother is here helps provide history.  Patient was reportedly over at family member's house, doing some electrical work.  Began to complain of some lower abdominal pain and vomiting beginning yesterday.  Today, he was febrile.  EMS arrived, patient febrile to 102.  They provided some Toradol , fluids.   Past Medical History Past Medical History:  Diagnosis Date   Anxiety    Back injury    Cervical disc disease    Chronic back pain    Chronic low back pain 03/29/2016   GERD (gastroesophageal reflux disease)    History of kidney stones    per mother- there was a diagnoses at some point   Insomnia    Lumbar disc disease    MIGRAINE, COMMON 09/23/2010   Qualifier: Diagnosis of  By: Norleen MD, Lynwood ORN    Pneumonia    Pulmonary embolism (HCC) 11/2018   Sickle cell trait [facility])    Patient Active Problem List   Diagnosis Date Noted   Opiate withdrawal (HCC) 12/06/2023   Other spondylosis with radiculopathy, cervical region 08/10/2020    Class: Chronic   Cervical spondylosis 08/10/2020   Cervical post-laminectomy syndrome 08/10/2020   Sinus bradycardia 07/14/2020   MDD (major depressive disorder), recurrent severe, 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   Polysubstance dependence including opioid type drug, episodic abuse (HCC) 12/09/2019   Substance induced mood disorder (HCC) 12/09/2019   MDD (major depressive disorder), severe (HCC) 12/09/2019   Duodenitis    Community acquired pneumonia 11/10/2019   Nausea vomiting and diarrhea 11/10/2019   Diarrhea 11/10/2019   History of pulmonary embolism 11/10/2019    AKI (acute kidney injury) (HCC) 11/10/2019   Sepsis (HCC) 11/09/2019   Alcohol  abuse, in remission 01/09/2019   Opioid use disorder, severe, dependence (HCC) 01/08/2019   Cervicalgia 12/13/2018   Acute low back pain with bilateral sciatica 12/13/2018   Chronic pain due to trauma 12/13/2018   Cervical radiculopathy 12/13/2018   Cocaine abuse, episodic (HCC) 12/11/2018   Thrombocytosis 12/05/2018   Lactic acidosis 12/04/2018   Pulmonary embolism (HCC) 11/27/2018   S/P lumbar laminectomy 08/29/2017   Spinal stenosis of lumbar region 08/28/2017    Class: Chronic   Status post lumbar microdiscectomy 08/28/2017   Herniation of nucleus pulposus of lumbar intervertebral disc with sciatica 08/28/2017    Class: Chronic   Gross hematuria 04/01/2016   Depression 04/01/2016   Chronic low back pain 03/29/2016   Cough 08/05/2015   Preoperative cardiovascular examination 02/17/2015   Lumbar disc disease    Cervical disc disease    GERD (gastroesophageal reflux disease)    Anxiety    Insomnia    Constipation 10/05/2010   MIGRAINE, COMMON 09/23/2010   Abdominal pain, other specified site 09/23/2010   Home Medication(s) Prior to Admission medications   Not on File  Past Surgical History Past Surgical History:  Procedure Laterality Date   BACK SURGERY     BIOPSY  11/12/2019   Procedure: BIOPSY;  Surgeon: Eda Iha, MD;  Location: [facility] ENDOSCOPY;  Service: Gastroenterology;;   ESOPHAGOGASTRODUODENOSCOPY (EGD) WITH PROPOFOL  N/A 11/12/2019   Procedure: ESOPHAGOGASTRODUODENOSCOPY (EGD) WITH PROPOFOL ;  Surgeon: Eda Iha, MD;  Location: [facility] ENDOSCOPY;  Service: Gastroenterology;  Laterality: N/A;   LUMBAR LAMINECTOMY/DECOMPRESSION MICRODISCECTOMY Bilateral 08/28/2017   Procedure: BILATERAL PARTIAL HEMILAMINECTOMIES L4-5 WITH MICRODISCECTOMY;  Surgeon: Lucilla Lynwood BRAVO, MD;  Location: [facility] OR;  Service: Orthopedics;  Laterality: Bilateral;   POSTERIOR CERVICAL FUSION/FORAMINOTOMY N/A 08/10/2020   Procedure: LEFT C6-7 FORAMINOTOMY;  Surgeon: Lucilla Lynwood BRAVO, MD;  Location: [facility] OR;  Service: Orthopedics;  Laterality: N/A;   Family History Family History  Problem Relation Age of Onset   Congestive Heart Failure Father    Hypertension Mother    Hyperlipidemia Mother    Stroke Mother     Social History Social History   Tobacco Use   Smoking status: Every Day    Current packs/day: 1.00    Average packs/day: 1 pack/day for 24.0 years (24.0 ttl pk-yrs)    Types: Cigarettes   Smokeless tobacco: Never   Tobacco comments:    notified primary care provider and CCM RNCM  Vaping Use   Vaping status: Some Days  Substance Use Topics   Alcohol  use: Not Currently    Comment: former use - heavy drinker 8 years ago   Drug use: Yes    Comment: Fentanyl  about 1 hour ago   Allergies Morphine  and codeine and Other  Review of Systems Review of Systems  Physical Exam Vital Signs  I have reviewed the triage vital signs BP (!) 174/89   Pulse 66   Temp (!) 101.7 F (38.7 C) (Oral)   Resp 19   Ht 5' 8 (1.727 m)   Wt 75 kg   SpO2 100%   BMI 25.14 kg/m   Physical Exam Vitals and nursing note reviewed.  Constitutional:      Appearance: He is toxic-appearing.  Eyes:     Pupils: Pupils are equal, round, and reactive to light.  Cardiovascular:     Rate and Rhythm: Normal rate.  Pulmonary:     Effort: Pulmonary effort is normal.  Abdominal:     General: Abdomen is flat.     Palpations: There is no mass.     Tenderness: There is abdominal tenderness. There is no guarding.  Genitourinary:    Penis: Normal.      Testes: Normal.     Comments: No swelling, no erythema Musculoskeletal:        General: Normal range of motion.  Neurological:     General: No focal deficit present.     Mental Status: He is alert and oriented to person, place, and  time.     Cranial Nerves: No cranial nerve deficit.     Motor: No weakness.     ED Results and Treatments Labs (all labs ordered are listed, but only abnormal results are displayed) Labs Reviewed  CBC WITH DIFFERENTIAL/PLATELET - Abnormal; Notable for the following components:      Result Value   WBC 12.3 (*)    Hemoglobin 12.9 (*)    HCT 38.1 (*)    MCV 77.9 (*)    Neutro Abs 10.7 (*)    All other components within normal limits  PROTIME-INR - Abnormal; Notable for the following components:   Prothrombin Time 15.6 (*)  All other components within normal limits  I-STAT CG4 LACTIC ACID, ED - Abnormal; Notable for the following components:   Lactic Acid, Venous 4.5 (*)    All other components within normal limits  RESP PANEL BY RT-PCR (RSV, FLU A&B, COVID)  RVPGX2  CULTURE, BLOOD (ROUTINE X 2)  CULTURE, BLOOD (ROUTINE X 2)  COMPREHENSIVE METABOLIC PANEL WITH GFR  URINALYSIS, W/ REFLEX TO CULTURE (INFECTION SUSPECTED)  I-STAT CG4 LACTIC ACID, ED                                                                                                                          Radiology DG Chest Port 1 View Result Date: 05/22/2024 CLINICAL DATA:  Sepsis EXAM: PORTABLE CHEST 1 VIEW COMPARISON:  11/09/2019 FINDINGS: The heart size and mediastinal contours are within normal limits. Both lungs are clear. The visualized skeletal structures are unremarkable. IMPRESSION: No active disease. Electronically Signed   By: Ozell Daring M.D.   On: 05/22/2024 21:59    Pertinent labs & imaging results that were available during my care of the patient were reviewed by me and considered in my medical decision making (see MDM for details).  Medications Ordered in ED Medications  lactated ringers  infusion ( Intravenous New Bag/Given 05/22/24 2145)  lactated ringers  bolus 1,000 mL (1,000 mLs Intravenous New Bag/Given 05/22/24 2138)    And  lactated ringers  bolus 1,000 mL (1,000 mLs Intravenous New Bag/Given  05/22/24 2204)    And  lactated ringers  bolus 500 mL (has no administration in time range)  acetaminophen  (TYLENOL ) tablet 1,000 mg (has no administration in time range)  ondansetron  (ZOFRAN ) injection 4 mg (has no administration in time range)  ceFEPIme  (MAXIPIME ) 2 g in sodium chloride  0.9 % 100 mL IVPB (2 g Intravenous New Bag/Given 05/22/24 2142)  metroNIDAZOLE  (FLAGYL ) IVPB 500 mg (500 mg Intravenous New Bag/Given 05/22/24 2204)  vancomycin  (VANCOCIN ) IVPB 1000 mg/200 mL premix (1,000 mg Intravenous New Bag/Given 05/22/24 2150)                                                                                                                                     Procedures .Critical Care  Performed by: Mannie Fairy DASEN, DO Authorized by: Mannie Fairy DASEN, DO   Critical care provider statement:    Critical care time (minutes):  34   Critical care was necessary to treat or prevent imminent  or life-threatening deterioration of the following conditions:  Sepsis   Critical care was time spent personally by me on the following activities:  Development of treatment plan with patient or surrogate, discussions with consultants, evaluation of patient's response to treatment, examination of patient, ordering and review of laboratory studies, ordering and review of radiographic studies, ordering and performing treatments and interventions, pulse oximetry, re-evaluation of patient's condition and review of old charts   (including critical care time)  Medical Decision Making / ED Course   This patient presents to the ED for concern of fever, this involves an extensive number of treatment options, and is a complaint that carries with it a high risk of complications and morbidity.  The differential diagnosis includes sepsis, UTI, appendicitis, diverticulitis, intra-abdominal infection, less like pneumonia, less likely meningitis.  MDM: Patient meets sepsis criteria.  Started on broad-spectrum antibiotics, IV  fluids.  Patient with abdominal tenderness.  With his history of-itis, my suspicion is this is likely urinary source.  Will obtain CT imaging of the patient's chest abdomen pelvis.  Patient alert, oriented.  I have lower suspicion for meningitis.  Is able to answer all questions appropriately.  No history of intra-abdominal surgeries.  Reassessment 11 PM-patient with elevated lactic acid.  Mild leukocytosis.  History of spinal surgery, prior history of opiate use.  If imaging of chest abdomen pelvis not reveal infectious process, would have concern for spinal infection.  He will be signed out to Dr. Midge pending remainder of his blood work, imaging and likely admission.  I have reevaluated the patient following his IV fluid bolus.  Additional history obtained: -Additional history obtained from mother at bedside -External records from outside source obtained and reviewed including: Chart review including previous notes, labs, imaging, consultation notes   Lab Tests: -I ordered, reviewed, and interpreted labs.   The pertinent results include:   Labs Reviewed  CBC WITH DIFFERENTIAL/PLATELET - Abnormal; Notable for the following components:      Result Value   WBC 12.3 (*)    Hemoglobin 12.9 (*)    HCT 38.1 (*)    MCV 77.9 (*)    Neutro Abs 10.7 (*)    All other components within normal limits  PROTIME-INR - Abnormal; Notable for the following components:   Prothrombin Time 15.6 (*)    All other components within normal limits  I-STAT CG4 LACTIC ACID, ED - Abnormal; Notable for the following components:   Lactic Acid, Venous 4.5 (*)    All other components within normal limits  RESP PANEL BY RT-PCR (RSV, FLU A&B, COVID)  RVPGX2  CULTURE, BLOOD (ROUTINE X 2)  CULTURE, BLOOD (ROUTINE X 2)  COMPREHENSIVE METABOLIC PANEL WITH GFR  URINALYSIS, W/ REFLEX TO CULTURE (INFECTION SUSPECTED)  I-STAT CG4 LACTIC ACID, ED      EKG my independent review of the patient's EKG shows no ST segment  depressions or elevations, no T wave inversions, no evidence of acute ischemia.  EKG Interpretation Date/Time:  Wednesday May 22 2024 21:25:03 EDT Ventricular Rate:  65 PR Interval:  130 QRS Duration:  82 QT Interval:  594 QTC Calculation: 618 R Axis:   84  Text Interpretation: Sinus rhythm Right atrial enlargement Prolonged QT interval Confirmed by Mannie Pac [phone]) on 05/22/2024 10:57:38 PM         Imaging Studies ordered: I ordered imaging studies including chest x-ray I independently visualized and interpreted imaging. I agree with the radiologist interpretation   Medicines ordered and prescription drug management: Meds  ordered this encounter  Medications   lactated ringers  infusion   ceFEPIme  (MAXIPIME ) 2 g in sodium chloride  0.9 % 100 mL IVPB    Antibiotic Indication::   Other Indication (list below)    Other Indication::   intra-abdominal infection   metroNIDAZOLE  (FLAGYL ) IVPB 500 mg    Antibiotic Indication::   Intra-abdominal Infection   vancomycin  (VANCOCIN ) IVPB 1000 mg/200 mL premix    Indication::   Other Indication (list below)    Other Indication::   intra-abdominal infection   AND Linked Order Group    lactated ringers  bolus 1,000 mL     Total Body Weight basis for 30 mL/kg  bolus delivery:   75 kg    lactated ringers  bolus 1,000 mL     Total Body Weight basis for 30 mL/kg  bolus delivery:   75 kg    lactated ringers  bolus 500 mL     Total Body Weight basis for 30 mL/kg  bolus delivery:   75 kg   acetaminophen  (TYLENOL ) tablet 1,000 mg   ondansetron  (ZOFRAN ) injection 4 mg    -I have reviewed the patients home medicines and have made adjustments as needed  Critical interventions Management of sepsis   Cardiac Monitoring: The patient was maintained on a cardiac monitor.  I personally viewed and interpreted the cardiac monitored which showed an underlying rhythm of: Normal sinus rhythm  Social Determinants of Health:  Factors impacting  patients care include: Lack of access to primary care   Reevaluation: After the interventions noted above, I reevaluated the patient and found that they have :improved  Co morbidities that complicate the patient evaluation  Past Medical History:  Diagnosis Date   Anxiety    Back injury    Cervical disc disease    Chronic back pain    Chronic low back pain 03/29/2016   GERD (gastroesophageal reflux disease)    History of kidney stones    per mother- there was a diagnoses at some point   Insomnia    Lumbar disc disease    MIGRAINE, COMMON 09/23/2010   Qualifier: Diagnosis of  By: Norleen MD, Lynwood ORN    Pneumonia    Pulmonary embolism (HCC) 11/2018   Sickle cell trait (HCC)          Final Clinical Impression(s) / ED Diagnoses Final diagnoses:  Sepsis, due to unspecified organism, unspecified whether acute organ dysfunction present [facility])     @PCDICTATION @    Mannie Pac T, DO 05/22/24 2306    Mannie Pac T, DO 05/22/24 2313    Mannie Pac T, DO 05/22/24 2343

## 2024-05-22 NOTE — ED Notes (Signed)
 Multiple attempts made by this RN to get his blood collected with no success, able to start IV with not enough blood return to collect blood. IV fluids and IV abx collected prior to blood cultures and regular labs.

## 2024-05-22 NOTE — ED Provider Notes (Signed)
 I assumed care at signout to follow-up on imaging and labs. No convincing signs or source of infection on CT imaging Patient is alert and follows commands, no meningeal signs. However he is high risk for discitis/epidural abscess as he has known history of substance use disorder and has had previous cervical spine and lumbar spine surgery per mother He has already received IV fluids and antibiotics MRI cervical/thoracic/lumbar spine have been ordered He will need to be admitted   Midge Golas, MD 05/22/24 2356

## 2024-05-22 NOTE — Progress Notes (Signed)
 Elink monitoring for the code sepsis protocol.

## 2024-05-23 ENCOUNTER — Emergency Department (HOSPITAL_COMMUNITY)

## 2024-05-23 ENCOUNTER — Encounter (HOSPITAL_COMMUNITY): Payer: Self-pay | Admitting: Internal Medicine

## 2024-05-23 DIAGNOSIS — K5289 Other specified noninfective gastroenteritis and colitis: Secondary | ICD-10-CM | POA: Diagnosis present

## 2024-05-23 DIAGNOSIS — A09 Infectious gastroenteritis and colitis, unspecified: Secondary | ICD-10-CM | POA: Diagnosis present

## 2024-05-23 DIAGNOSIS — A419 Sepsis, unspecified organism: Secondary | ICD-10-CM | POA: Diagnosis present

## 2024-05-23 DIAGNOSIS — K529 Noninfective gastroenteritis and colitis, unspecified: Secondary | ICD-10-CM

## 2024-05-23 DIAGNOSIS — I4581 Long QT syndrome: Secondary | ICD-10-CM | POA: Diagnosis present

## 2024-05-23 DIAGNOSIS — E872 Acidosis, unspecified: Secondary | ICD-10-CM

## 2024-05-23 DIAGNOSIS — Z823 Family history of stroke: Secondary | ICD-10-CM | POA: Diagnosis not present

## 2024-05-23 DIAGNOSIS — K219 Gastro-esophageal reflux disease without esophagitis: Secondary | ICD-10-CM | POA: Diagnosis present

## 2024-05-23 DIAGNOSIS — F1490 Cocaine use, unspecified, uncomplicated: Secondary | ICD-10-CM | POA: Diagnosis present

## 2024-05-23 DIAGNOSIS — R6520 Severe sepsis without septic shock: Secondary | ICD-10-CM

## 2024-05-23 DIAGNOSIS — R509 Fever, unspecified: Secondary | ICD-10-CM | POA: Diagnosis present

## 2024-05-23 DIAGNOSIS — R9431 Abnormal electrocardiogram [ECG] [EKG]: Secondary | ICD-10-CM | POA: Diagnosis not present

## 2024-05-23 DIAGNOSIS — R1084 Generalized abdominal pain: Secondary | ICD-10-CM | POA: Diagnosis not present

## 2024-05-23 DIAGNOSIS — Z8249 Family history of ischemic heart disease and other diseases of the circulatory system: Secondary | ICD-10-CM | POA: Diagnosis not present

## 2024-05-23 DIAGNOSIS — Z91148 Patient's other noncompliance with medication regimen for other reason: Secondary | ICD-10-CM | POA: Diagnosis not present

## 2024-05-23 DIAGNOSIS — A0839 Other viral enteritis: Secondary | ICD-10-CM

## 2024-05-23 DIAGNOSIS — Z86718 Personal history of other venous thrombosis and embolism: Secondary | ICD-10-CM | POA: Diagnosis not present

## 2024-05-23 DIAGNOSIS — G8929 Other chronic pain: Secondary | ICD-10-CM | POA: Diagnosis present

## 2024-05-23 DIAGNOSIS — Z83438 Family history of other disorder of lipoprotein metabolism and other lipidemia: Secondary | ICD-10-CM | POA: Diagnosis not present

## 2024-05-23 DIAGNOSIS — E876 Hypokalemia: Secondary | ICD-10-CM | POA: Diagnosis present

## 2024-05-23 DIAGNOSIS — E8729 Other acidosis: Secondary | ICD-10-CM | POA: Diagnosis not present

## 2024-05-23 DIAGNOSIS — E8809 Other disorders of plasma-protein metabolism, not elsewhere classified: Secondary | ICD-10-CM | POA: Diagnosis present

## 2024-05-23 DIAGNOSIS — D573 Sickle-cell trait: Secondary | ICD-10-CM | POA: Diagnosis present

## 2024-05-23 DIAGNOSIS — R6521 Severe sepsis with septic shock: Secondary | ICD-10-CM | POA: Diagnosis present

## 2024-05-23 DIAGNOSIS — F17210 Nicotine dependence, cigarettes, uncomplicated: Secondary | ICD-10-CM | POA: Diagnosis present

## 2024-05-23 DIAGNOSIS — Z1152 Encounter for screening for COVID-19: Secondary | ICD-10-CM | POA: Diagnosis not present

## 2024-05-23 DIAGNOSIS — Z720 Tobacco use: Secondary | ICD-10-CM

## 2024-05-23 DIAGNOSIS — F17290 Nicotine dependence, other tobacco product, uncomplicated: Secondary | ICD-10-CM | POA: Diagnosis present

## 2024-05-23 DIAGNOSIS — D6859 Other primary thrombophilia: Secondary | ICD-10-CM | POA: Diagnosis present

## 2024-05-23 DIAGNOSIS — Z86711 Personal history of pulmonary embolism: Secondary | ICD-10-CM | POA: Diagnosis not present

## 2024-05-23 LAB — CBC WITH DIFFERENTIAL/PLATELET
Abs Immature Granulocytes: 0.03 K/uL (ref 0.00–0.07)
Basophils Absolute: 0 K/uL (ref 0.0–0.1)
Basophils Relative: 0 %
Eosinophils Absolute: 0 K/uL (ref 0.0–0.5)
Eosinophils Relative: 0 %
HCT: 35.6 % — ABNORMAL LOW (ref 39.0–52.0)
Hemoglobin: 12.1 g/dL — ABNORMAL LOW (ref 13.0–17.0)
Immature Granulocytes: 0 %
Lymphocytes Relative: 13 %
Lymphs Abs: 1.3 K/uL (ref 0.7–4.0)
MCH: 26.4 pg (ref 26.0–34.0)
MCHC: 34 g/dL (ref 30.0–36.0)
MCV: 77.7 fL — ABNORMAL LOW (ref 80.0–100.0)
Monocytes Absolute: 0.9 K/uL (ref 0.1–1.0)
Monocytes Relative: 9 %
Neutro Abs: 8 K/uL — ABNORMAL HIGH (ref 1.7–7.7)
Neutrophils Relative %: 78 %
Platelets: 252 K/uL (ref 150–400)
RBC: 4.58 MIL/uL (ref 4.22–5.81)
RDW: 14.7 % (ref 11.5–15.5)
WBC: 10.2 K/uL (ref 4.0–10.5)
nRBC: 0 % (ref 0.0–0.2)

## 2024-05-23 LAB — RAPID URINE DRUG SCREEN, HOSP PERFORMED
Amphetamines: NOT DETECTED
Barbiturates: NOT DETECTED
Benzodiazepines: POSITIVE — AB
Cocaine: POSITIVE — AB
Opiates: NOT DETECTED
Tetrahydrocannabinol: NOT DETECTED

## 2024-05-23 LAB — COMPREHENSIVE METABOLIC PANEL WITH GFR
ALT: 13 U/L (ref 0–44)
AST: 16 U/L (ref 15–41)
Albumin: 3 g/dL — ABNORMAL LOW (ref 3.5–5.0)
Alkaline Phosphatase: 36 U/L — ABNORMAL LOW (ref 38–126)
Anion gap: 5 (ref 5–15)
BUN: 11 mg/dL (ref 6–20)
CO2: 21 mmol/L — ABNORMAL LOW (ref 22–32)
Calcium: 7.9 mg/dL — ABNORMAL LOW (ref 8.9–10.3)
Chloride: 113 mmol/L — ABNORMAL HIGH (ref 98–111)
Creatinine, Ser: 1.05 mg/dL (ref 0.61–1.24)
GFR, Estimated: >60 mL/min (ref >60)
Glucose, Bld: 94 mg/dL (ref 70–99)
Potassium: 3.1 mmol/L — ABNORMAL LOW (ref 3.5–5.1)
Sodium: 139 mmol/L (ref 135–145)
Total Bilirubin: 0.9 mg/dL (ref 0.0–1.2)
Total Protein: 6.1 g/dL — ABNORMAL LOW (ref 6.5–8.1)

## 2024-05-23 LAB — ETHANOL: Alcohol, Ethyl (B): <15 mg/dL (ref <15)

## 2024-05-23 LAB — LACTIC ACID, PLASMA: Lactic Acid, Venous: 4.1 mmol/L (ref 0.5–1.9)

## 2024-05-23 LAB — CK: Total CK: 222 U/L (ref 49–397)

## 2024-05-23 LAB — SEDIMENTATION RATE: Sed Rate: 4 mm/h (ref 0–16)

## 2024-05-23 LAB — MAGNESIUM: Magnesium: 1.4 mg/dL — ABNORMAL LOW (ref 1.7–2.4)

## 2024-05-23 LAB — C-REACTIVE PROTEIN: CRP: <0.5 mg/dL (ref <1.0)

## 2024-05-23 LAB — I-STAT CG4 LACTIC ACID, ED: Lactic Acid, Venous: 6.8 mmol/L (ref 0.5–1.9)

## 2024-05-23 LAB — HIV ANTIBODY (ROUTINE TESTING W REFLEX): HIV Screen 4th Generation wRfx: NONREACTIVE

## 2024-05-23 MED ORDER — SODIUM CHLORIDE 0.9 % IV SOLN
2.0000 g | Freq: Three times a day (TID) | INTRAVENOUS | Status: DC
  Administered 2024-05-23 – 2024-05-24 (×4): 2 g via INTRAVENOUS
  Filled 2024-05-23 (×4): qty 12.5

## 2024-05-23 MED ORDER — DIAZEPAM 5 MG/ML IJ SOLN
2.5000 mg | Freq: Three times a day (TID) | INTRAMUSCULAR | Status: DC | PRN
  Administered 2024-05-24: 2.5 mg via INTRAVENOUS
  Filled 2024-05-23: qty 2

## 2024-05-23 MED ORDER — LORAZEPAM 1 MG PO TABS
1.0000 mg | ORAL_TABLET | Freq: Three times a day (TID) | ORAL | Status: DC | PRN
  Administered 2024-05-24: 1 mg via ORAL
  Filled 2024-05-23: qty 1

## 2024-05-23 MED ORDER — ORAL CARE MOUTH RINSE: 15.0000 mL | OROMUCOSAL | Status: DC | PRN

## 2024-05-23 MED ORDER — NICOTINE POLACRILEX 2 MG MT GUM
2.0000 mg | CHEWING_GUM | OROMUCOSAL | Status: DC | PRN
  Filled 2024-05-23 (×2): qty 1

## 2024-05-23 MED ORDER — MAGNESIUM SULFATE 4 GM/100ML IV SOLN
4.0000 g | Freq: Once | INTRAVENOUS | Status: AC
  Administered 2024-05-23: 4 g via INTRAVENOUS
  Filled 2024-05-23: qty 100

## 2024-05-23 MED ORDER — ENOXAPARIN SODIUM 40 MG/0.4ML IJ SOSY
40.0000 mg | PREFILLED_SYRINGE | INTRAMUSCULAR | Status: DC
  Administered 2024-05-23 – 2024-05-24 (×2): 40 mg via SUBCUTANEOUS
  Filled 2024-05-23 (×2): qty 0.4

## 2024-05-23 MED ORDER — GADOBUTROL 1 MMOL/ML IV SOLN
8.0000 mL | Freq: Once | INTRAVENOUS | Status: AC | PRN
  Administered 2024-05-23: 8 mL via INTRAVENOUS

## 2024-05-23 MED ORDER — ACETAMINOPHEN 325 MG PO TABS
650.0000 mg | ORAL_TABLET | Freq: Four times a day (QID) | ORAL | Status: DC | PRN
  Administered 2024-05-24: 650 mg via ORAL
  Filled 2024-05-23: qty 2

## 2024-05-23 MED ORDER — HYDROMORPHONE HCL 1 MG/ML IJ SOLN
0.5000 mg | INTRAMUSCULAR | Status: DC | PRN
  Administered 2024-05-23 – 2024-05-24 (×5): 0.5 mg via INTRAVENOUS
  Filled 2024-05-23 (×5): qty 0.5

## 2024-05-23 MED ORDER — ONDANSETRON HCL 4 MG/2ML IJ SOLN: 4.0000 mg | Freq: Four times a day (QID) | INTRAMUSCULAR | Status: DC | PRN

## 2024-05-23 MED ORDER — LORAZEPAM 2 MG/ML IJ SOLN
0.5000 mg | Freq: Four times a day (QID) | INTRAMUSCULAR | Status: DC | PRN
  Administered 2024-05-23 (×2): 0.5 mg via INTRAVENOUS
  Filled 2024-05-23 (×2): qty 1

## 2024-05-23 MED ORDER — NICOTINE 21 MG/24HR TD PT24
21.0000 mg | MEDICATED_PATCH | Freq: Every day | TRANSDERMAL | Status: DC | PRN
  Administered 2024-05-24: 21 mg via TRANSDERMAL
  Filled 2024-05-23: qty 1

## 2024-05-23 MED ORDER — METRONIDAZOLE 500 MG/100ML IV SOLN
500.0000 mg | Freq: Two times a day (BID) | INTRAVENOUS | Status: DC
  Administered 2024-05-23 (×2): 500 mg via INTRAVENOUS
  Filled 2024-05-23 (×3): qty 100

## 2024-05-23 MED ORDER — LACTATED RINGERS IV SOLN: INTRAVENOUS | Status: DC

## 2024-05-23 MED ORDER — VANCOMYCIN HCL IN DEXTROSE 1-5 GM/200ML-% IV SOLN
1000.0000 mg | Freq: Two times a day (BID) | INTRAVENOUS | Status: DC
  Administered 2024-05-23 (×2): 1000 mg via INTRAVENOUS
  Filled 2024-05-23 (×3): qty 200

## 2024-05-23 MED ORDER — MELATONIN 3 MG PO TABS
3.0000 mg | ORAL_TABLET | Freq: Every evening | ORAL | Status: DC | PRN
  Administered 2024-05-25 – 2024-05-26 (×2): 3 mg via ORAL
  Filled 2024-05-23 (×2): qty 1

## 2024-05-23 MED ORDER — NALOXONE HCL 0.4 MG/ML IJ SOLN: 0.4000 mg | INTRAMUSCULAR | Status: DC | PRN

## 2024-05-23 MED ORDER — POTASSIUM CHLORIDE 10 MEQ/100ML IV SOLN
10.0000 meq | INTRAVENOUS | Status: AC
  Administered 2024-05-23 (×4): 10 meq via INTRAVENOUS
  Filled 2024-05-23 (×4): qty 100

## 2024-05-23 MED ORDER — ACETAMINOPHEN 650 MG RE SUPP
650.0000 mg | Freq: Four times a day (QID) | RECTAL | Status: DC | PRN
Start: 2024-05-23 — End: 2024-05-24

## 2024-05-23 NOTE — ED Provider Notes (Signed)
 Patient seen by Dr. Maree with critical care He agrees the patient is unlikely to have meningitis as he is not confused and answers questions However unclear cause of his elevated lactate.  But since he is not hypotensive, he feels he is safe for progressive or stepdown He does agree with pursuing MRI CTL spine  Discussed with Dr. Marcene with [facility] for admission   .Critical Care  Performed by: Midge Golas, MD Authorized by: Midge Golas, MD   Critical care provider statement:    Critical care time (minutes):  60   Critical care start time:  05/23/2024 12:30 AM   Critical care end time:  05/23/2024 1:30 AM   Critical care time was exclusive of:  Separately billable procedures and treating other patients   Critical care was necessary to treat or prevent imminent or life-threatening deterioration of the following conditions:  Sepsis, shock and CNS failure or compromise   Critical care was time spent personally by me on the following activities:  Examination of patient, evaluation of patient's response to treatment, development of treatment plan with patient or surrogate, pulse oximetry, ordering and review of laboratory studies, re-evaluation of patient's condition and review of old charts   I assumed direction of critical care for this patient from another provider in my specialty: yes     Care discussed with: admitting provider       Midge Golas, MD 05/23/24 [phone]

## 2024-05-23 NOTE — Progress Notes (Signed)
 Pharmacy Antibiotic Note  Jason [name] is a 43 y.o. male admitted on 05/22/2024 with sepsis.  Pharmacy has been consulted for Vancomycin  dosing. Pt with fever and altered mental status. WBC 12.3. Renal function ok. Tmax 101.7   Plan: Vancomycin  1000 mg IV q12h >>>Estimated AUC: 500 Cefepime  per MD Trend WBC, temp, renal function  F/U infectious work-up Drug levels as indicated   Height: 5' 8 (172.7 cm) Weight: 75 kg (165 lb 5.5 oz) IBW/kg (Calculated) : 68.4  Temp (24hrs), Avg:100.9 F (38.3 C), Min:99.9 F (37.7 C), Max:101.7 F (38.7 C)  Recent Labs  Lab 05/22/24 2227 05/23/24 0008  WBC 12.3*  --   CREATININE 1.11  --   LATICACIDVEN 4.5* 6.8*    Estimated Creatinine Clearance: 83.9 mL/min (by C-G formula based on SCr of 1.11 mg/dL).    Allergies  Allergen Reactions   Morphine  And Codeine Swelling   Other Hives and Swelling    Antibiotic that was given when he had pneumonia     Lynwood Mckusick, PharmD, BCPS Clinical Pharmacist Phone: [phone]

## 2024-05-23 NOTE — ED Provider Notes (Signed)
 Patient has lactate about 6 despite adequate fluid resuscitation for up to 5 L I discussed with Dr. Maree with critical care who will evaluate the patient   Midge Golas, MD 05/23/24 [phone]

## 2024-05-23 NOTE — ED Notes (Signed)
 Pt transported to MRI

## 2024-05-23 NOTE — Consult Note (Signed)
 NAME:  Jason [name], MRN:  [id number], DOB:  02-22-1981, LOS: 0 ADMISSION DATE:  05/22/2024, CONSULTATION DATE:  05/23/2024 REFERRING MD:  Nancyann Kin, MD, CHIEF COMPLAINT:  Fever  History of Present Illness:  43 y/o male with PMH for seizure, Cocaine/Narcotics use, depression, PE, Back pain-chrornic who apparently went to work in his usual state of health and became sick on the job.  He installs ssecurity cameras.  He was BIB EMS and had fever, was weak and very lethargic.  He was noted to be incontinent of urine and EMS had given him Torodol 15mg , Zofran  8mg  and 500cc LR.  In the ED his LA was 4.5 and he was started on Sepsis fluid bolus.  He had c/o abd pain, pain with urination and urinary frequency along with vomiting.  CT Abdomen showing enteritis.  Despite his ssepsis fluids and more, about 5 liters his LA increased form 4.5 to 6.8 with WBC 12.3 and UA effectively negative.  Pertinent  Medical History    Anxiety     Back injury     Cervical disc disease     Chronic back pain     Chronic low back pain 03/29/2016   GERD (gastroesophageal reflux disease)     History of kidney stones             Lumbar disc disease     MIGRAINE, COMMON 09/23/2010    Qualifier: Diagnosis of  By: Norleen MD, [place] ORN    Pneumonia     Pulmonary embolism (HCC) 11/2018   Sickle cell trait (HCC)     Significant Hospital Events: Including procedures, antibiotic start and stop dates in addition to other pertinent events   8/7:Admit to Step down  Interim History / Subjective:  N/A  Objective    Blood pressure (!) 166/88, pulse 68, temperature (!) 101.1 F (38.4 C), temperature source Oral, resp. rate (!) 21, height 5' 8 (1.727 m), weight 75 kg, SpO2 100%.        Intake/Output Summary (Last 24 hours) at 05/23/2024 0105 Last data filed at 05/22/2024 2350 Gross per 24 hour  Intake 3400 ml  Output --  Net 3400 ml   Filed Weights   05/22/24 2117  Weight: 75 kg    Examination: General:  lethargic but arousable HENT: PERRLA, no icterus, EOMI Lungs: CTA no wheezes no rales Cardiovascular: reg s1s2 no murmurs Abdomen: c/o abd pain but no guarding, no rigidity, BS pos Extremities: no cyanosis, clubbing or edema Neuro: lethargic but AAO x 3-knows place and time along with self, Follows commands and moving all extremities, generalized weakness   Resolved problem list   Assessment and Plan  Fever Lactic Acidosis Abdominal Pain Enteritis  Patient has received antibiotics and sepsis fluids, Blood cultures x 2.  AAO x 3 precludes meningitis along with no meningeal signs.  MRI pending for back pain.  Increased LA despite IV fluids.  I would also suggest a urine tox screen and Amylases/Lipase levels, along with CPK levels. For now though as patient is not on pressors and is able to protect his airways, he can be admitted to step down unit.  Best Practice (right click and Reselect all SmartList Selections daily)   Diet/type: NPO DVT prophylaxis prophylactic heparin   Pressure ulcer(s): N/A GI prophylaxis: H2B Lines: N/A Foley:  N/A Code Status:  full code   Labs   CBC: Recent Labs  Lab 05/22/24 2227  WBC 12.3*  NEUTROABS 10.7*  HGB 12.9*  HCT 38.1*  MCV 77.9*  PLT 319    Basic Metabolic Panel: Recent Labs  Lab 05/22/24 2227  NA 141  K 3.5  CL 103  CO2 22  GLUCOSE 128*  BUN 13  CREATININE 1.11  CALCIUM  9.1   GFR: Estimated Creatinine Clearance: 83.9 mL/min (by C-G formula based on SCr of 1.11 mg/dL). Recent Labs  Lab 05/22/24 2227 05/23/24 0008  WBC 12.3*  --   LATICACIDVEN 4.5* 6.8*    Liver Function Tests: Recent Labs  Lab 05/22/24 2227  AST 19  ALT 14  ALKPHOS 47  BILITOT 0.9  PROT 6.8  ALBUMIN 3.7   No results for input(s): LIPASE, AMYLASE in the last 168 hours. No results for input(s): AMMONIA in the last 168 hours.  ABG    Component Value Date/Time   TCO2 26 12/05/2008 2101     Coagulation Profile: Recent Labs   Lab 05/22/24 2227  INR 1.2    Cardiac Enzymes: No results for input(s): CKTOTAL, CKMB, CKMBINDEX, TROPONINI in the last 168 hours.  HbA1C: Hgb A1c MFr Bld  Date/Time Value Ref Range Status  06/10/2020 04:54 PM 6.1 (H) 4.8 - 5.6 % Final    Comment:             Prediabetes: 5.7 - 6.4          Diabetes: >6.4          Glycemic control for adults with diabetes: <7.0   12/11/2018 12:58 PM 6.0 (H) 4.8 - 5.6 % Final    Comment:             Prediabetes: 5.7 - 6.4          Diabetes: >6.4          Glycemic control for adults with diabetes: <7.0     CBG: No results for input(s): GLUCAP in the last 168 hours.  Review of Systems:   Abd pain, N/V  Past Medical History:  He,  has a past medical history of Anxiety, Back injury, Cervical disc disease, Chronic back pain, Chronic low back pain (03/29/2016), GERD (gastroesophageal reflux disease), History of kidney stones, Insomnia, Lumbar disc disease, MIGRAINE, COMMON (09/23/2010), Pneumonia, Pulmonary embolism (HCC) (11/2018), and Sickle cell trait (HCC).   Surgical History:   Past Surgical History:  Procedure Laterality Date   BACK SURGERY     BIOPSY  11/12/2019   Procedure: BIOPSY;  Surgeon: Eda Iha, MD;  Location: [facility] ENDOSCOPY;  Service: Gastroenterology;;   ESOPHAGOGASTRODUODENOSCOPY (EGD) WITH PROPOFOL  N/A 11/12/2019   Procedure: ESOPHAGOGASTRODUODENOSCOPY (EGD) WITH PROPOFOL ;  Surgeon: Eda Iha, MD;  Location: [facility] ENDOSCOPY;  Service: Gastroenterology;  Laterality: N/A;   LUMBAR LAMINECTOMY/DECOMPRESSION MICRODISCECTOMY Bilateral 08/28/2017   Procedure: BILATERAL PARTIAL HEMILAMINECTOMIES L4-5 WITH MICRODISCECTOMY;  Surgeon: Lucilla Lynwood BRAVO, MD;  Location: [facility] OR;  Service: Orthopedics;  Laterality: Bilateral;   POSTERIOR CERVICAL FUSION/FORAMINOTOMY N/A 08/10/2020   Procedure: LEFT C6-7 FORAMINOTOMY;  Surgeon: Lucilla Lynwood BRAVO, MD;  Location: [facility] OR;  Service: Orthopedics;  Laterality: N/A;     Social  History:   reports that he has been smoking cigarettes. He has a 24 pack-year smoking history. He has never used smokeless tobacco. He reports that he does not currently use alcohol . He reports current drug use.   Family History:  His family history includes Congestive Heart Failure in his father; Hyperlipidemia in his mother; Hypertension in his mother; Stroke in his mother.   Allergies Allergies  Allergen Reactions   Morphine  And Codeine Swelling   Other Hives  and Swelling    Antibiotic that was given when he had pneumonia      Home Medications  Prior to Admission medications   Not on File     Critical care time: 31    Critical Care Time devoted to patient care services, exclusive of separately billable procedures, described in this note is 31 minutes.   Orlin Fairly, MD [facility] See Amion for pager  If no response to pager , please call [phone] until 7pm After 7:00 pm call Elink  [phone] 05/23/2024, 1:06 AM

## 2024-05-23 NOTE — ED Provider Notes (Signed)
 Patient has been admitted, MRI cervical/thoracic/lumbar spine showed no source of infection Patient had no convincing signs of ischemic bowel on CT abdomen pelvis Blood cultures are pending Patient has been endorsed to the inpatient team   Jason Golas, MD 05/23/24 [phone]

## 2024-05-23 NOTE — Progress Notes (Signed)
 PROGRESS NOTE        PATIENT DETAILS Name: Jason [name] Age: 43 y.o. Sex: male Date of Birth: [birth year] Admit Date: 05/22/2024 Admitting Physician Eva KATHEE Pore, DO PCP:Pcp, No  Brief Summary: Patient is a 43 y.o.  male chronic back/neck pain-who presented with fever/myalgias (mid/lower back)-of vomiting-thought to have sepsis physiology-subsequently admitted to the hospitalist service.  Significant events: 8/6>> admit to [facility]  Significant studies: 8/6>> CT chest/abdomen/pelvis: Mild wall thickening of the distal small bowel-infectious versus inflammatory enteritis.  No pneumonia. 8/7>> MRI C-spine/T-spine/LS spine: No discitis or osteomyelitis.  Significant microbiology data: 8/6>> COVID/influenza/RSV PCR: Negative 8/6>> blood culture: Pending  Procedures: None  Consults: None  Subjective: Vomited a couple of times overnight-no diarrhea.  Continues to have mid/low back pain.  No headache.  Sleepy-answers questions very reluctantly-very poor historian.  Objective: Vitals: Blood pressure (!) 142/78, pulse 62, temperature 99.6 F (37.6 C), temperature source Oral, resp. rate (!) 28, height 5' 8 (1.727 m), weight 75 kg, SpO2 100%.   Exam: Gen Exam: Not in any distress. HEENT:atraumatic, normocephalic.  No meningeal signs. Chest: B/L clear to auscultation anteriorly CVS:S1S2 regular Abdomen:soft non tender, non distended Extremities:no edema Neurology: Non focal-has good strength in the lower legs bilaterally. Skin: no rash  Pertinent Labs/Radiology:    Latest Ref Rng & Units 05/23/2024    6:01 AM 05/22/2024   10:27 PM 12/04/2023   12:23 PM  CBC  WBC 4.0 - 10.5 K/uL 10.2  12.3  7.7   Hemoglobin 13.0 - 17.0 g/dL 87.8  87.0  86.0   Hematocrit 39.0 - 52.0 % 35.6  38.1  41.9   Platelets 150 - 400 K/uL 252  319  326     Lab Results  Component Value Date   NA 139 05/23/2024   K 3.1 (L) 05/23/2024   CL 113 (H) 05/23/2024   CO2 21  (L) 05/23/2024      Assessment/Plan: Severe sepsis Although has mild enteritis on CT imaging-seems disproportionate to his clinical features. No other source identified by exam or imaging (CT chest/abdomen/MRI of entire spine) Await blood cultures Antiemetics for vomiting If he has diarrhea-send out stool studies In the interim-continue broad-spectrum antibiotics and other supportive care.  QTc prolongation Potentially due to hypokalemia/hypomagnesemia Telemetry monitoring Supplement electrolytes and repeat EKG tomorrow morning  Hypokalemia/hypomagnesemia Secondary to GI loss-several episodes of vomiting Replete/recheck.  Hypocalcemia Mild Probably due to combination of hypoalbuminemia/hypomagnesemia Replete magnesium  Recheck electrolytes tomorrow.  History of seizures Previously evaluated by neurology-not on any AEDs.  Outpatient EEG was negative.  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  Tobacco abuse Counseled prior to discharge  Prior UDS positive for cocaine Poor historian but denies ongoing use Repeat UDS.  Chronic back pain-/P C6-7 foraminotomy 2021 Checked narcotic database-does not appear to be getting any narcotics as an outpatient.  Code status:   Code Status: Full Code   DVT Prophylaxis: enoxaparin  (LOVENOX ) injection 40 mg Start: 05/23/24 1015 SCDs Start: 05/23/24 0156   Family Communication: Mother at bedside   Disposition Plan: Status is: Inpatient Remains inpatient appropriate because: Severity of illness   Planned Discharge Destination:Home   Diet: Diet Order             Diet regular Room service appropriate? Yes; Fluid  consistency: Thin  Diet effective now                     Antimicrobial agents: Anti-infectives (From admission, onward)    Start     Dose/Rate Route Frequency Ordered Stop   05/23/24  1000  metroNIDAZOLE  (FLAGYL ) IVPB 500 mg        500 mg 100 mL/hr over 60 Minutes Intravenous Every 12 hours 05/23/24 0304     05/23/24 1000  vancomycin  (VANCOCIN ) IVPB 1000 mg/200 mL premix        1,000 mg 200 mL/hr over 60 Minutes Intravenous Every 12 hours 05/23/24 0444     05/23/24 0600  ceFEPIme  (MAXIPIME ) 2 g in sodium chloride  0.9 % 100 mL IVPB        2 g 200 mL/hr over 30 Minutes Intravenous Every 8 hours 05/23/24 0443     05/22/24 2130  ceFEPIme  (MAXIPIME ) 2 g in sodium chloride  0.9 % 100 mL IVPB        2 g 200 mL/hr over 30 Minutes Intravenous  Once 05/22/24 2119 05/22/24 2349   05/22/24 2130  metroNIDAZOLE  (FLAGYL ) IVPB 500 mg        500 mg 100 mL/hr over 60 Minutes Intravenous  Once 05/22/24 2119 05/22/24 2349   05/22/24 2130  vancomycin  (VANCOCIN ) IVPB 1000 mg/200 mL premix        1,000 mg 200 mL/hr over 60 Minutes Intravenous  Once 05/22/24 2119 05/22/24 2349        MEDICATIONS: Scheduled Meds:  enoxaparin  (LOVENOX ) injection  40 mg Subcutaneous Q24H   Continuous Infusions:  ceFEPime  (MAXIPIME ) IV 2 g (05/23/24 0514)   lactated ringers  150 mL/hr at 05/22/24 2145   magnesium  sulfate bolus IVPB     metronidazole      potassium chloride      vancomycin      PRN Meds:.acetaminophen  **OR** acetaminophen , HYDROmorphone  (DILAUDID ) injection, LORazepam , melatonin, naLOXone  (NARCAN )  injection, nicotine , nicotine  polacrilex   I have personally reviewed following labs and imaging studies  LABORATORY DATA: CBC: Recent Labs  Lab 05/22/24 2227 05/23/24 0601  WBC 12.3* 10.2  NEUTROABS 10.7* 8.0*  HGB 12.9* 12.1*  HCT 38.1* 35.6*  MCV 77.9* 77.7*  PLT 319 252    Basic Metabolic Panel: Recent Labs  Lab 05/22/24 2227 05/23/24 0601  NA 141 139  K 3.5 3.1*  CL 103 113*  CO2 22 21*  GLUCOSE 128* 94  BUN 13 11  CREATININE 1.11 1.05  CALCIUM  9.1 7.9*  MG  --  1.4*    GFR: Estimated Creatinine Clearance: 88.7 mL/min (by C-G formula based on SCr of 1.05  mg/dL).  Liver Function Tests: Recent Labs  Lab 05/22/24 2227 05/23/24 0601  AST 19 16  ALT 14 13  ALKPHOS 47 36*  BILITOT 0.9 0.9  PROT 6.8 6.1*  ALBUMIN 3.7 3.0*   No results for input(s): LIPASE, AMYLASE in the last 168 hours. No results for input(s): AMMONIA in the last 168 hours.  Coagulation Profile: Recent Labs  Lab 05/22/24 2227  INR 1.2    Cardiac Enzymes: Recent Labs  Lab 05/23/24 0601  CKTOTAL 222    BNP (last 3 results) No results for input(s): PROBNP in the last 8760 hours.  Lipid Profile: No results for input(s): CHOL, HDL, LDLCALC, TRIG, CHOLHDL, LDLDIRECT in the last 72 hours.  Thyroid  Function Tests: No results for input(s): TSH, T4TOTAL, FREET4, T3FREE, THYROIDAB in the last 72 hours.  Anemia Panel: No results for input(s): VITAMINB12, FOLATE,  FERRITIN, TIBC, IRON, RETICCTPCT in the last 72 hours.  Urine analysis:    Component Value Date/Time   COLORURINE YELLOW 05/22/2024 2242   APPEARANCEUR HAZY (A) 05/22/2024 2242   APPEARANCEUR Hazy 01/13/2014 1418   LABSPEC 1.023 05/22/2024 2242   LABSPEC 1.028 01/13/2014 1418   PHURINE 5.0 05/22/2024 2242   GLUCOSEU NEGATIVE 05/22/2024 2242   GLUCOSEU NEGATIVE 03/23/2016 1215   HGBUR MODERATE (A) 05/22/2024 2242   BILIRUBINUR NEGATIVE 05/22/2024 2242   BILIRUBINUR negative 12/11/2018 1645   BILIRUBINUR Negative 01/13/2014 1418   KETONESUR 20 (A) 05/22/2024 2242   PROTEINUR 100 (A) 05/22/2024 2242   UROBILINOGEN 0.2 12/11/2018 1645   UROBILINOGEN 0.2 03/23/2016 1215   NITRITE NEGATIVE 05/22/2024 2242   LEUKOCYTESUR NEGATIVE 05/22/2024 2242   LEUKOCYTESUR Negative 01/13/2014 1418    Sepsis Labs: Lactic Acid, Venous    Component Value Date/Time   LATICACIDVEN 4.1 (HH) 05/23/2024 0601    MICROBIOLOGY: Recent Results (from the past 240 hours)  Resp panel by RT-PCR (RSV, Flu A&B, Covid) Anterior Nasal Swab     Status: None   Collection Time:  05/22/24 10:07 PM   Specimen: Anterior Nasal Swab  Result Value Ref Range Status   SARS Coronavirus 2 by RT PCR NEGATIVE NEGATIVE Final   Influenza A by PCR NEGATIVE NEGATIVE Final   Influenza B by PCR NEGATIVE NEGATIVE Final    Comment: (NOTE) The Xpert Xpress SARS-CoV-2/FLU/RSV plus assay is intended as an aid in the diagnosis of influenza from Nasopharyngeal swab specimens and should not be used as a sole basis for treatment. Nasal washings and aspirates are unacceptable for Xpert Xpress SARS-CoV-2/FLU/RSV testing.  Fact Sheet for Patients: BloggerCourse.com  Fact Sheet for Healthcare Providers: SeriousBroker.it  This test is not yet approved or cleared by the [place]  FDA and has been authorized for detection and/or diagnosis of SARS-CoV-2 by FDA under an Emergency Use Authorization (EUA). This EUA will remain in effect (meaning this test can be used) for the duration of the COVID-19 declaration under Section 564(b)(1) of the Act, 21 U.S.C. section 360bbb-3(b)(1), unless the authorization is terminated or revoked.     Resp Syncytial Virus by PCR NEGATIVE NEGATIVE Final    Comment: (NOTE) Fact Sheet for Patients: BloggerCourse.com  Fact Sheet for Healthcare Providers: SeriousBroker.it  This test is not yet approved or cleared by the [place]  FDA and has been authorized for detection and/or diagnosis of SARS-CoV-2 by FDA under an Emergency Use Authorization (EUA). This EUA will remain in effect (meaning this test can be used) for the duration of the COVID-19 declaration under Section 564(b)(1) of the Act, 21 U.S.C. section 360bbb-3(b)(1), unless the authorization is terminated or revoked.  Performed at [facility], [address] [place], [place] [address]     RADIOLOGY STUDIES/RESULTS: MR Cervical Spine W and Wo Contrast Result Date:  05/23/2024 CLINICAL DATA:  Low back pain, prior surgery, new symptoms; Osteomyelitis, cervical; Mid-back pain EXAM: MRI CERVICAL, THORACIC AND LUMBAR SPINE WITHOUT AND WITH CONTRAST TECHNIQUE: Multiplanar and multiecho pulse sequences of the cervical spine, to include the craniocervical junction and cervicothoracic junction, and thoracic and lumbar spine, were obtained without and with intravenous contrast. CONTRAST:  8mL GADAVIST  GADOBUTROL  1 MMOL/ML IV SOLN COMPARISON:  CT chest/abdomen/pelvis 05/22/2024. MRI cervical and lumbar spine 05/09/2020. FINDINGS: MRI CERVICAL SPINE FINDINGS Mild to moderately motion limited study.  Within this limitation: Alignment: Normal. Vertebrae: No visible in marrow edema to suggest acute fracture or discitis/osteomyelitis. No suspicious bone lesions. Vertebral  body heights are maintained. No abnormal enhancement. Cord: Normal cord signal. Posterior Fossa, vertebral arteries, paraspinal tissues: Negative. Disc levels: C2-C3: No significant disc protrusion, foraminal stenosis, or canal stenosis. C3-C4: Uncovertebral hypertrophy bilaterally with mild right foraminal stenosis. No significant canal or left foraminal stenosis. C4-C5: Left greater than right uncovertebral hypertrophy contributes to mild left foraminal stenosis. No significant canal or right foraminal stenosis. C5-C6: Posterior disc osteophyte complex and bilateral facet and uncovertebral hypertrophy. Resulting moderate right and mild left foraminal stenosis. Patent canal. C6-C7: Posterior disc osteophyte complex with bilateral facet and uncovertebral hypertrophy. Suspected postoperative changes on the left including left laminectomy but motion limits assessment. The foramina appear patent but evaluation is limited. C7-T1: Right greater than left facet and uncovertebral hypertrophy. Mild right foraminal stenosis. MRI THORACIC SPINE FINDINGS Alignment:  Normal. Vertebrae: No marrow edema to suggest acute fracture or  discitis/osteomyelitis. No suspicious bone lesions. No abnormal enhancement. Cord:  Normal cord signal.  No abnormal enhancement. Paraspinal and other soft tissues: Unremarkable. Disc levels: No significant canal stenosis.  No high-grade foraminal stenosis. MRI LUMBAR SPINE FINDINGS Segmentation:  Normal. Alignment:  Physiologic. Vertebrae: Inferior L5 endplate degenerative/discogenic signal changes. Otherwise, no marrow edema to suggest acute fracture or discitis/osteomyelitis. No suspicious bone lesions. No abnormal enhancement. Conus medullaris and cauda equina: Conus extends to the L1 level. Conus and cauda equina appear normal. No abnormal enhancement. Paraspinal and other soft tissues: Unremarkable. Disc levels: T12-L1: No significant disc protrusion, foraminal stenosis, or canal stenosis. L1-L2: No significant disc protrusion, foraminal stenosis, or canal stenosis. L2-L3: No significant disc protrusion, foraminal stenosis, or canal stenosis. L3-L4: No significant disc protrusion, foraminal stenosis, or canal stenosis. L4-L5: Mild broad disc bulge. Bilateral laminectomies. No significant canal stenosis. Similar mild bilateral foraminal stenosis. L5-S1: Similar small central disc protrusion without significant canal or foraminal stenosis. IMPRESSION: 1. No evidence of acute abnormality or discitis/osteomyelitis. 2. At C5-C6, similar moderate right and mild left foraminal stenosis. 3. At C6-C7, improved left foraminal stenosis status post left laminectomy although evaluation is limited due to motion at this level. 4. Similar mild bilateral foraminal stenosis at L4-L5. 5. No significant canal or foraminal stenosis in the thoracic spine. Electronically Signed   By: Gilmore GORMAN Molt M.D.   On: 05/23/2024 02:29   MR THORACIC SPINE W WO CONTRAST Result Date: 05/23/2024 CLINICAL DATA:  Low back pain, prior surgery, new symptoms; Osteomyelitis, cervical; Mid-back pain EXAM: MRI CERVICAL, THORACIC AND LUMBAR SPINE  WITHOUT AND WITH CONTRAST TECHNIQUE: Multiplanar and multiecho pulse sequences of the cervical spine, to include the craniocervical junction and cervicothoracic junction, and thoracic and lumbar spine, were obtained without and with intravenous contrast. CONTRAST:  8mL GADAVIST  GADOBUTROL  1 MMOL/ML IV SOLN COMPARISON:  CT chest/abdomen/pelvis 05/22/2024. MRI cervical and lumbar spine 05/09/2020. FINDINGS: MRI CERVICAL SPINE FINDINGS Mild to moderately motion limited study.  Within this limitation: Alignment: Normal. Vertebrae: No visible in marrow edema to suggest acute fracture or discitis/osteomyelitis. No suspicious bone lesions. Vertebral body heights are maintained. No abnormal enhancement. Cord: Normal cord signal. Posterior Fossa, vertebral arteries, paraspinal tissues: Negative. Disc levels: C2-C3: No significant disc protrusion, foraminal stenosis, or canal stenosis. C3-C4: Uncovertebral hypertrophy bilaterally with mild right foraminal stenosis. No significant canal or left foraminal stenosis. C4-C5: Left greater than right uncovertebral hypertrophy contributes to mild left foraminal stenosis. No significant canal or right foraminal stenosis. C5-C6: Posterior disc osteophyte complex and bilateral facet and uncovertebral hypertrophy. Resulting moderate right and mild left foraminal stenosis. Patent canal. C6-C7: Posterior disc osteophyte complex  with bilateral facet and uncovertebral hypertrophy. Suspected postoperative changes on the left including left laminectomy but motion limits assessment. The foramina appear patent but evaluation is limited. C7-T1: Right greater than left facet and uncovertebral hypertrophy. Mild right foraminal stenosis. MRI THORACIC SPINE FINDINGS Alignment:  Normal. Vertebrae: No marrow edema to suggest acute fracture or discitis/osteomyelitis. No suspicious bone lesions. No abnormal enhancement. Cord:  Normal cord signal.  No abnormal enhancement. Paraspinal and other soft  tissues: Unremarkable. Disc levels: No significant canal stenosis.  No high-grade foraminal stenosis. MRI LUMBAR SPINE FINDINGS Segmentation:  Normal. Alignment:  Physiologic. Vertebrae: Inferior L5 endplate degenerative/discogenic signal changes. Otherwise, no marrow edema to suggest acute fracture or discitis/osteomyelitis. No suspicious bone lesions. No abnormal enhancement. Conus medullaris and cauda equina: Conus extends to the L1 level. Conus and cauda equina appear normal. No abnormal enhancement. Paraspinal and other soft tissues: Unremarkable. Disc levels: T12-L1: No significant disc protrusion, foraminal stenosis, or canal stenosis. L1-L2: No significant disc protrusion, foraminal stenosis, or canal stenosis. L2-L3: No significant disc protrusion, foraminal stenosis, or canal stenosis. L3-L4: No significant disc protrusion, foraminal stenosis, or canal stenosis. L4-L5: Mild broad disc bulge. Bilateral laminectomies. No significant canal stenosis. Similar mild bilateral foraminal stenosis. L5-S1: Similar small central disc protrusion without significant canal or foraminal stenosis. IMPRESSION: 1. No evidence of acute abnormality or discitis/osteomyelitis. 2. At C5-C6, similar moderate right and mild left foraminal stenosis. 3. At C6-C7, improved left foraminal stenosis status post left laminectomy although evaluation is limited due to motion at this level. 4. Similar mild bilateral foraminal stenosis at L4-L5. 5. No significant canal or foraminal stenosis in the thoracic spine. Electronically Signed   By: Gilmore GORMAN Molt M.D.   On: 05/23/2024 02:29   MR Lumbar Spine W Wo Contrast Result Date: 05/23/2024 CLINICAL DATA:  Low back pain, prior surgery, new symptoms; Osteomyelitis, cervical; Mid-back pain EXAM: MRI CERVICAL, THORACIC AND LUMBAR SPINE WITHOUT AND WITH CONTRAST TECHNIQUE: Multiplanar and multiecho pulse sequences of the cervical spine, to include the craniocervical junction and cervicothoracic  junction, and thoracic and lumbar spine, were obtained without and with intravenous contrast. CONTRAST:  8mL GADAVIST  GADOBUTROL  1 MMOL/ML IV SOLN COMPARISON:  CT chest/abdomen/pelvis 05/22/2024. MRI cervical and lumbar spine 05/09/2020. FINDINGS: MRI CERVICAL SPINE FINDINGS Mild to moderately motion limited study.  Within this limitation: Alignment: Normal. Vertebrae: No visible in marrow edema to suggest acute fracture or discitis/osteomyelitis. No suspicious bone lesions. Vertebral body heights are maintained. No abnormal enhancement. Cord: Normal cord signal. Posterior Fossa, vertebral arteries, paraspinal tissues: Negative. Disc levels: C2-C3: No significant disc protrusion, foraminal stenosis, or canal stenosis. C3-C4: Uncovertebral hypertrophy bilaterally with mild right foraminal stenosis. No significant canal or left foraminal stenosis. C4-C5: Left greater than right uncovertebral hypertrophy contributes to mild left foraminal stenosis. No significant canal or right foraminal stenosis. C5-C6: Posterior disc osteophyte complex and bilateral facet and uncovertebral hypertrophy. Resulting moderate right and mild left foraminal stenosis. Patent canal. C6-C7: Posterior disc osteophyte complex with bilateral facet and uncovertebral hypertrophy. Suspected postoperative changes on the left including left laminectomy but motion limits assessment. The foramina appear patent but evaluation is limited. C7-T1: Right greater than left facet and uncovertebral hypertrophy. Mild right foraminal stenosis. MRI THORACIC SPINE FINDINGS Alignment:  Normal. Vertebrae: No marrow edema to suggest acute fracture or discitis/osteomyelitis. No suspicious bone lesions. No abnormal enhancement. Cord:  Normal cord signal.  No abnormal enhancement. Paraspinal and other soft tissues: Unremarkable. Disc levels: No significant canal stenosis.  No high-grade foraminal stenosis. MRI LUMBAR  SPINE FINDINGS Segmentation:  Normal. Alignment:   Physiologic. Vertebrae: Inferior L5 endplate degenerative/discogenic signal changes. Otherwise, no marrow edema to suggest acute fracture or discitis/osteomyelitis. No suspicious bone lesions. No abnormal enhancement. Conus medullaris and cauda equina: Conus extends to the L1 level. Conus and cauda equina appear normal. No abnormal enhancement. Paraspinal and other soft tissues: Unremarkable. Disc levels: T12-L1: No significant disc protrusion, foraminal stenosis, or canal stenosis. L1-L2: No significant disc protrusion, foraminal stenosis, or canal stenosis. L2-L3: No significant disc protrusion, foraminal stenosis, or canal stenosis. L3-L4: No significant disc protrusion, foraminal stenosis, or canal stenosis. L4-L5: Mild broad disc bulge. Bilateral laminectomies. No significant canal stenosis. Similar mild bilateral foraminal stenosis. L5-S1: Similar small central disc protrusion without significant canal or foraminal stenosis. IMPRESSION: 1. No evidence of acute abnormality or discitis/osteomyelitis. 2. At C5-C6, similar moderate right and mild left foraminal stenosis. 3. At C6-C7, improved left foraminal stenosis status post left laminectomy although evaluation is limited due to motion at this level. 4. Similar mild bilateral foraminal stenosis at L4-L5. 5. No significant canal or foraminal stenosis in the thoracic spine. Electronically Signed   By: Gilmore GORMAN Molt M.D.   On: 05/23/2024 02:29   CT CHEST ABDOMEN PELVIS W CONTRAST Result Date: 05/22/2024 CLINICAL DATA:  Dysuria, urinary frequency, cystitis, altered level of consciousness, sepsis EXAM: CT CHEST, ABDOMEN, AND PELVIS WITH CONTRAST TECHNIQUE: Multidetector CT imaging of the chest, abdomen and pelvis was performed following the standard protocol during bolus administration of intravenous contrast. RADIATION DOSE REDUCTION: This exam was performed according to the departmental dose-optimization program which includes automated exposure control,  adjustment of the mA and/or kV according to patient size and/or use of iterative reconstruction technique. CONTRAST:  75mL OMNIPAQUE  IOHEXOL  350 MG/ML SOLN COMPARISON:  05/04/2023, 05/22/2024 FINDINGS: CT CHEST FINDINGS Cardiovascular: The heart is unremarkable without pericardial effusion. No evidence of thoracic aortic aneurysm or dissection. Mediastinum/Nodes: No enlarged mediastinal, hilar, or axillary lymph nodes. Thyroid  gland, trachea, and esophagus demonstrate no significant findings. Lungs/Pleura: No acute airspace disease, effusion, or pneumothorax. Chronic scarring at the right lung base. Subpleural emphysematous changes within the upper lobes. Central airways are patent. Musculoskeletal: No acute or destructive bony abnormalities. Reconstructed images demonstrate no additional findings. CT ABDOMEN PELVIS FINDINGS Hepatobiliary: No focal liver abnormality is seen. No gallstones, gallbladder wall thickening, or biliary dilatation. Pancreas: Unremarkable. No pancreatic ductal dilatation or surrounding inflammatory changes. Spleen: Normal in size without focal abnormality. Adrenals/Urinary Tract: Adrenal glands are unremarkable. Kidneys are normal, without renal calculi, focal lesion, or hydronephrosis. Bladder is unremarkable. Stomach/Bowel: No evidence of bowel obstruction or ileus. Mild wall thickening throughout the distal small bowel could reflect inflammatory or infectious enteritis. The appendix is not well visualized. Vascular/Lymphatic: No significant vascular findings are present. No enlarged abdominal or pelvic lymph nodes. Reproductive: Prostate is unremarkable. Other: No free fluid or free intraperitoneal gas. No abdominal wall hernia. Musculoskeletal: No acute or destructive bony abnormalities. Reconstructed images demonstrate no additional findings. IMPRESSION: 1. Mild wall thickening of the distal small bowel, compatible with inflammatory or infectious enteritis. No bowel obstruction or  ileus. 2. Chronic scarring at the right lung base. No acute airspace disease. Electronically Signed   By: Ozell Daring M.D.   On: 05/22/2024 23:43   DG Chest Port 1 View Result Date: 05/22/2024 CLINICAL DATA:  Sepsis EXAM: PORTABLE CHEST 1 VIEW COMPARISON:  11/09/2019 FINDINGS: The heart size and mediastinal contours are within normal limits. Both lungs are clear. The visualized skeletal structures are unremarkable. IMPRESSION: No active  disease. Electronically Signed   By: Ozell Daring M.D.   On: 05/22/2024 21:59     LOS: 0 days   Donalda Applebaum, MD  [facility]    To contact the attending provider between 7A-7P or the covering provider during after hours 7P-7A, please log into the web site [email or web address] and access using universal [place] password for that web site. If you do not have the password, please call the hospital operator.  05/23/2024, 9:24 AM

## 2024-05-23 NOTE — Plan of Care (Signed)

## 2024-05-23 NOTE — Plan of Care (Signed)
 Pt just changed to regular diet.  Drank some ginger ale. Pain under control now.  PRN meds used.   Problem: Education: Goal: Knowledge of General Education information will improve Description: Including pain rating scale, medication(s)/side effects and non-pharmacologic comfort measures Outcome: Progressing   Problem: Health Behavior/Discharge Planning: Goal: Ability to manage health-related needs will improve Outcome: Progressing   Problem: Clinical Measurements: Goal: Ability to maintain clinical measurements within normal limits will improve Outcome: Progressing Goal: Will remain free from infection Outcome: Progressing Goal: Diagnostic test results will improve Outcome: Progressing Goal: Respiratory complications will improve Outcome: Progressing Goal: Cardiovascular complication will be avoided Outcome: Progressing   Problem: Nutrition: Goal: Adequate nutrition will be maintained Outcome: Not Progressing   Problem: Coping: Goal: Level of anxiety will decrease Outcome: Progressing   Problem: Elimination: Goal: Will not experience complications related to bowel motility Outcome: Progressing

## 2024-05-23 NOTE — H&P (Signed)
 History and Physical      Jason [name] FMW:[id number] DOB: 27-May-1981 DOA: 05/22/2024; DOS: 05/23/2024  PCP: Pcp, No (will further assess) Patient coming from: home   I have personally briefly reviewed patient's old medical records in [facility]  Chief Complaint: Fever  HPI: Jason [name] is a 43 y.o. male with medical history significant for chronic back pain, chronic neck pain, who is admitted to [facility] on 05/22/2024 with severe sepsis due to suspected infectious enteritis after presenting from home to [facility] ED complaining of fever.   The patient reports 1 day of fever in the absence of chills, full body rigors, or generalized myalgias.  This has been associated with worsening low back pain, he describes it as sharp, with lateral/anterior radiation into the abdomen.  He also notes associated nausea in the absence of vomiting.  No recent melena or hematochezia.  This low back discomfort is superimposed on his documented history of chronic low back discomfort, noting a history of lumbar laminectomy/decompression microdiscectomy involving L4-L5 in November 2018.  He also has a history of chronic neck pain, and is noted to have undergone posterior cervical fusion/foraminectomy involving C6-C7 in October 2021.  Denies any will acute worsening of his chronic neck discomfort nor any associated acute neck stiffness.   Per my discussions with EDP, patient has a history of polysubstance abuse.  He denies any recent dysuria or gross hematuria.  No recent shortness of breath or cough.  No known history of inflammatory bowel disease.    ED Course:  Vital signs in the ED were notable for the following: Temperature max 1-1.7, subsequent decreasing to 100.9 after interval Tylenol , as further quantified below; heart rates in the 60s to 80s; systolic blood pressures in the 160s to 180; respiratory rate 16-23, oxygen saturation 100% on room air.  Labs were notable for the following:  CMP was notable for the following: Sodium 141, bicarbonate 22, creatinine 1.11, glucose 128, liver enzymes were within normal limits.  Lactic acid initially 4.5, with repeat value trending up to 6.8.  CBC notable for white blood cell count 12,300 with 87% neutrophils.  INR 1.2.  Urinalysis notable for no white blood cells, leukocyte esterase/nitrate negative, moderate hemoglobin in the absence of any RBCs, also showing the presence of hyaline casts, specific gravity 1.023 as well as 20 ketones.  Blood cultures x 2 were collected prior to initiation of IV antibiotics.  COVID, influenza, RSV PCR were all negative.  Per my interpretation, EKG in ED demonstrated the following: Sinus rhythm with heart rate 65, prolonged QTc of 618 ms, nonspecific T wave version in aVL, will will demonstrate no evidence of ST changes, including no evidence of ST elevation.  Imaging in the ED, per corresponding formal radiology read, was notable for the following: 1 view chest x-ray showed no evidence of acute cardiopulmonary process.  CT chest, abdomen, pelvis with IV contrast showed mild wall thickening of the distal small bowel consistent with enteritis of infectious versus inflammatory etiology, without evidence of bowel obstruction, abscess, perforation, or ileus.  Otherwise, no evidence of acute intra-abdominal or acute intrapelvic process.  Additionally, no evidence of acute intrathoracic process, including no evidence of infiltrate or edema, effusion, or pneumothorax.  MRI of the cervical, thoracic, lumbar spine have been ordered by EDP, with results currently pending.  In the setting of increasing LA, EDP d/w on-call PCCM, Dr. Maree, who, in the context of pt's hemodynamic stability, recommended Hospitalist admission and conveyed that  PCCM will be available, as needed, if additional assistance/formal consult is needed.   While in the ED, the following were administered: Acetaminophen  1 g p.o. x 1 dose, Zofran  4 mg IV x 1  dose, IV vancomycin , cefepime , IV Flagyl .  Lactated Ringer 's x 2.5 L bolus followed by initiation continuous LR running at 150 cc/h.  Subsequently, the patient was admitted for further evaluation management of suspected severe sepsis due to infectious enteritis, with presenting labs also notable for lactic acidosis, and presenting EKG notable for QTc prolongation.     Review of Systems: As per HPI otherwise 10 point review of systems negative.   Past Medical History:  Diagnosis Date   Anxiety    Back injury    Cervical disc disease    Chronic back pain    Chronic low back pain 03/29/2016   GERD (gastroesophageal reflux disease)    History of kidney stones    per mother- there was a diagnoses at some point   Insomnia    Lumbar disc disease    MIGRAINE, COMMON 09/23/2010   Qualifier: Diagnosis of  By: Norleen MD, Lynwood ORN    Pneumonia    Pulmonary embolism (HCC) 11/2018   Sickle cell trait (HCC)     Past Surgical History:  Procedure Laterality Date   BACK SURGERY     BIOPSY  11/12/2019   Procedure: BIOPSY;  Surgeon: Eda Iha, MD;  Location: [facility] ENDOSCOPY;  Service: Gastroenterology;;   ESOPHAGOGASTRODUODENOSCOPY (EGD) WITH PROPOFOL  N/A 11/12/2019   Procedure: ESOPHAGOGASTRODUODENOSCOPY (EGD) WITH PROPOFOL ;  Surgeon: Eda Iha, MD;  Location: [facility] ENDOSCOPY;  Service: Gastroenterology;  Laterality: N/A;   LUMBAR LAMINECTOMY/DECOMPRESSION MICRODISCECTOMY Bilateral 08/28/2017   Procedure: BILATERAL PARTIAL HEMILAMINECTOMIES L4-5 WITH MICRODISCECTOMY;  Surgeon: Lucilla Lynwood BRAVO, MD;  Location: [facility] OR;  Service: Orthopedics;  Laterality: Bilateral;   POSTERIOR CERVICAL FUSION/FORAMINOTOMY N/A 08/10/2020   Procedure: LEFT C6-7 FORAMINOTOMY;  Surgeon: Lucilla Lynwood BRAVO, MD;  Location: [facility] OR;  Service: Orthopedics;  Laterality: N/A;    Social History:  reports that he has been smoking cigarettes. He has a 24 pack-year smoking history. He has never used smokeless tobacco. He reports  that he does not currently use alcohol . He reports current drug use.   Allergies  Allergen Reactions   Morphine  And Codeine Swelling   Other Hives and Swelling    Antibiotic that was given when he had pneumonia     Family History  Problem Relation Age of Onset   Congestive Heart Failure Father    Hypertension Mother    Hyperlipidemia Mother    Stroke Mother     Family history reviewed and not pertinent   Outpatient medications: Not on any prescription or over-the-counter medications or supplements.  Objective    Physical Exam: Vitals:   05/22/24 2345 05/23/24 0000 05/23/24 0030 05/23/24 0045  BP:   (!) 166/88 (!) 185/79  Pulse:   68 69  Resp: (!) 29 (!) 23 (!) 21 (!) 21  Temp:  (!) 101.1 F (38.4 C)    TempSrc:  Oral    SpO2:  100% 100% 100%  Weight:      Height:        General: appears to be stated age; alert, oriented Skin: warm, dry, no rash Head:  AT/[place] Mouth:  Oral mucosa membranes appear dry, normal dentition Neck: supple; trachea midline Heart:  RRR; did not appreciate any M/R/G Lungs: CTAB, did not appreciate any wheezes, rales, or rhonchi Abdomen: + BS; soft, ND, mild tenderness  to palpation in the right lower quadrant, in the absence of associated guarding, rigidity, or rebound tenderness. Vascular: 2+ pedal pulses b/l; 2+ radial pulses b/l Extremities: no peripheral edema, no muscle wasting    Labs on Admission: I have personally reviewed following labs and imaging studies  CBC: Recent Labs  Lab 05/22/24 2227  WBC 12.3*  NEUTROABS 10.7*  HGB 12.9*  HCT 38.1*  MCV 77.9*  PLT 319   Basic Metabolic Panel: Recent Labs  Lab 05/22/24 2227  NA 141  K 3.5  CL 103  CO2 22  GLUCOSE 128*  BUN 13  CREATININE 1.11  CALCIUM  9.1   GFR: Estimated Creatinine Clearance: 83.9 mL/min (by C-G formula based on SCr of 1.11 mg/dL). Liver Function Tests: Recent Labs  Lab 05/22/24 2227  AST 19  ALT 14  ALKPHOS 47  BILITOT 0.9  PROT 6.8   ALBUMIN 3.7   No results for input(s): LIPASE, AMYLASE in the last 168 hours. No results for input(s): AMMONIA in the last 168 hours. Coagulation Profile: Recent Labs  Lab 05/22/24 2227  INR 1.2   Cardiac Enzymes: No results for input(s): CKTOTAL, CKMB, CKMBINDEX, TROPONINI in the last 168 hours. BNP (last 3 results) No results for input(s): PROBNP in the last 8760 hours. HbA1C: No results for input(s): HGBA1C in the last 72 hours. CBG: No results for input(s): GLUCAP in the last 168 hours. Lipid Profile: No results for input(s): CHOL, HDL, LDLCALC, TRIG, CHOLHDL, LDLDIRECT in the last 72 hours. Thyroid  Function Tests: No results for input(s): TSH, T4TOTAL, FREET4, T3FREE, THYROIDAB in the last 72 hours. Anemia Panel: No results for input(s): VITAMINB12, FOLATE, FERRITIN, TIBC, IRON, RETICCTPCT in the last 72 hours. Urine analysis:    Component Value Date/Time   COLORURINE YELLOW 05/22/2024 2242   APPEARANCEUR HAZY (A) 05/22/2024 2242   APPEARANCEUR Hazy 01/13/2014 1418   LABSPEC 1.023 05/22/2024 2242   LABSPEC 1.028 01/13/2014 1418   PHURINE 5.0 05/22/2024 2242   GLUCOSEU NEGATIVE 05/22/2024 2242   GLUCOSEU NEGATIVE 03/23/2016 1215   HGBUR MODERATE (A) 05/22/2024 2242   BILIRUBINUR NEGATIVE 05/22/2024 2242   BILIRUBINUR negative 12/11/2018 1645   BILIRUBINUR Negative 01/13/2014 1418   KETONESUR 20 (A) 05/22/2024 2242   PROTEINUR 100 (A) 05/22/2024 2242   UROBILINOGEN 0.2 12/11/2018 1645   UROBILINOGEN 0.2 03/23/2016 1215   NITRITE NEGATIVE 05/22/2024 2242   LEUKOCYTESUR NEGATIVE 05/22/2024 2242   LEUKOCYTESUR Negative 01/13/2014 1418    Radiological Exams on Admission: CT CHEST ABDOMEN PELVIS W CONTRAST Result Date: 05/22/2024 CLINICAL DATA:  Dysuria, urinary frequency, cystitis, altered level of consciousness, sepsis EXAM: CT CHEST, ABDOMEN, AND PELVIS WITH CONTRAST TECHNIQUE: Multidetector CT imaging of the  chest, abdomen and pelvis was performed following the standard protocol during bolus administration of intravenous contrast. RADIATION DOSE REDUCTION: This exam was performed according to the departmental dose-optimization program which includes automated exposure control, adjustment of the mA and/or kV according to patient size and/or use of iterative reconstruction technique. CONTRAST:  75mL OMNIPAQUE  IOHEXOL  350 MG/ML SOLN COMPARISON:  05/04/2023, 05/22/2024 FINDINGS: CT CHEST FINDINGS Cardiovascular: The heart is unremarkable without pericardial effusion. No evidence of thoracic aortic aneurysm or dissection. Mediastinum/Nodes: No enlarged mediastinal, hilar, or axillary lymph nodes. Thyroid  gland, trachea, and esophagus demonstrate no significant findings. Lungs/Pleura: No acute airspace disease, effusion, or pneumothorax. Chronic scarring at the right lung base. Subpleural emphysematous changes within the upper lobes. Central airways are patent. Musculoskeletal: No acute or destructive bony abnormalities. Reconstructed images demonstrate no additional findings.  CT ABDOMEN PELVIS FINDINGS Hepatobiliary: No focal liver abnormality is seen. No gallstones, gallbladder wall thickening, or biliary dilatation. Pancreas: Unremarkable. No pancreatic ductal dilatation or surrounding inflammatory changes. Spleen: Normal in size without focal abnormality. Adrenals/Urinary Tract: Adrenal glands are unremarkable. Kidneys are normal, without renal calculi, focal lesion, or hydronephrosis. Bladder is unremarkable. Stomach/Bowel: No evidence of bowel obstruction or ileus. Mild wall thickening throughout the distal small bowel could reflect inflammatory or infectious enteritis. The appendix is not well visualized. Vascular/Lymphatic: No significant vascular findings are present. No enlarged abdominal or pelvic lymph nodes. Reproductive: Prostate is unremarkable. Other: No free fluid or free intraperitoneal gas. No abdominal  wall hernia. Musculoskeletal: No acute or destructive bony abnormalities. Reconstructed images demonstrate no additional findings. IMPRESSION: 1. Mild wall thickening of the distal small bowel, compatible with inflammatory or infectious enteritis. No bowel obstruction or ileus. 2. Chronic scarring at the right lung base. No acute airspace disease. Electronically Signed   By: Ozell Daring M.D.   On: 05/22/2024 23:43   DG Chest Port 1 View Result Date: 05/22/2024 CLINICAL DATA:  Sepsis EXAM: PORTABLE CHEST 1 VIEW COMPARISON:  11/09/2019 FINDINGS: The heart size and mediastinal contours are within normal limits. Both lungs are clear. The visualized skeletal structures are unremarkable. IMPRESSION: No active disease. Electronically Signed   By: Ozell Daring M.D.   On: 05/22/2024 21:59      Assessment/Plan   Principal Problem:   Severe sepsis (HCC) Active Problems:   Lactic acidosis   Enteritis   Prolonged QT interval   Tobacco abuse   Fever    #) Severe sepsis due to suspected infectious enteritis: Diagnosis on the basis of 1 day of objective fever, associated with abdominal discomfort preceded by nausea, with CT abdomen/pelvis showing evidence of wall thickening involving the distal small bowel consistent with enteritis, felt to be of infectious versus inflammatory etiology.  In setting of the patient's subjective fever, there is concern for associated infectious enteritis.  SIRS criteria met via objective fever, leukocytosis with neutrophilic predominance, tachypnea. Lactic acid level: Initially 4.5, with repeat value trending up to 6.8. Of note, given the associated presence of suspected end organ damage in the form of concominant presenting lactic acidosis, criteria are met for pt's sepsis to be considered severe in nature.  In the setting of lactic acid greater than 4.0, the patient has received greater than a 30 mL/kg IVF bolus (with documented body weight of 75 kg) by way of receipt of  2.5 liter LR bolus.  Will meeting dietary for septic shock on basis of the degree of elevation in his lactic acid level, he has remained hemodynamically stable, without any evidence of hypotension throughout his ED course.  In terms of other potential sources of infection, he is at risk for discitis, osteomyelitis given his report of polysubstance abuse and presenting acute worsening of low back discomfort.  MRI of the cervical, thoracic, and lumbar spine are currently pending.  Will also check ESR and CRP to further evaluate.   Otherwise, no evidence of underlying additional infectious processes at this time, including urinalysis that is not consistent with UTI, will CT chest showed no evidence of infiltrate distress pneumonia, and COVID, influenza, and RSV PCR were all negative.  Additional ED work-up/management notable for: Blood cultures x 2 were collected prior to initiation of broad-spectrum IV antibiotics in the form of IV vancomycin , cefepime , and Flagyl .  For now, we will continue at least 3 IV antibiotics while awaiting results of MRI  of the cervical, thoracic, and lumbar spine.  However, if MRI of the cervical, thoracic, lumbar spine subsequently shows no evidence of acute process, including no evidence of acute infection, can likely de-escalate spectrum of antibiotic coverage.  Of note, no evidence of acute neck stiffness or additional meningeal signs to increase index of suspicion for meningitis.  Plan: CBC w/ diff and CMP in AM.  Follow for results of blood cx's x 2. Abx: For now, continue IV vancomycin  and cefepime , and IV Flagyl .  Monitor for results of MRI of the cervical, thoracic, and lumbar spine, with consideration for de-escalation of IV antibiotics, as above, and if the MRI imaging is suggestive of acute infection.  Continuous lactated Ringer 's at 150 cc/h.  Repeat lactic acid level ordered with morning labs.  Monitor strict I's and O's and daily weights.  Check CRP, ESR.  Check  urinary drug screen.  Check Anti-Saccharomyces cerevisiae antibodies.  Prn IV Dilaudid  for abdominal pain.                 #) Lactic acidosis: Initial lactate 4.5, repeat trending up to 6.8 in spite of receipt of 2.5 L of LR bolus.  Potentially multifactorial in etiology, with concern for presenting severe sepsis due to infectious enteritis, as above, with MRI of the cervical, thoracic, lumbar spine currently pending.  However, in addition to these infectious possibilities, there is suspicion for additional noninfectious contributing factors leading to his presenting lactic acidosis.  In the setting of urinalysis showing moderate hemoglobin without any RBCs, will check CPK to evaluate for any evidence of underlying rhabdomyolysis.  In the context of his history of polysubstance abuse, will check serum ethanol level as well as urinary drug screen.  No elevation in glucose to increase index of suspicion for DKA.  Appears hemodynamically stable, as outlined above.  There may also be an element of starvation keto lactic acidosis given the presence of 20 ketones noted in this evening's UA.   Plan: IV fluids, as above.  Repeat lactic acid level with morning labs.  Repeat CMP, CBC in the morning.  Further evaluation management of suspected severe sepsis due to infectious enteritis, as above.  Follow-up results of MRI cervical, thoracic, lumbar spine, as above.  Check CPK level, urinary drug screen, serum ethanol level.                        #) QTc prolongation: Presenting EKG demonstrates QTc of  618  ms.  Presenting magnesium  level  Plan: Monitor on telemetry.  Presenting potassium level 3.5.  Check magnesium  level.  Repeat EKG in the morning to monitor interval degree of QTc prolongation.  Will pursue prn IV Ativan  as antiemetic of choice.  Hold additional prn IV Zofran  for now.                    #) Chronic tobacco abuse: Patient conveys that they are a  current smoker, having smoked 1 ppd for about 25 years.   Plan: Counseled the patient for 3-5 minutes on the importance of complete smoking discontinuation.  Order placed for prn nicotine  patch as well as prn Nicorette  gum for use during this hospitalization.       DVT prophylaxis: SCD's   Code Status: Full code Family Communication: none Disposition Plan: Per Rounding Team Consults called: In the setting of increasing LA, EDP d/w on-call PCCM, Dr. Maree, who, in the context of pt's hemodynamic stability, recommended Hospitalist admission  and conveyed that PCCM will be available, as needed, if additional assistance/formal consult is needed. ;  Admission status: Inpatient     I SPENT GREATER THAN 75  MINUTES IN CLINICAL CARE TIME/MEDICAL DECISION-MAKING IN COMPLETING THIS ADMISSION.      Eva NOVAK Gissel Keilman DO [facility]  From 7PM - 7AM   05/23/2024, 2:02 AM

## 2024-05-24 ENCOUNTER — Other Ambulatory Visit (HOSPITAL_COMMUNITY): Payer: Self-pay

## 2024-05-24 DIAGNOSIS — R509 Fever, unspecified: Secondary | ICD-10-CM | POA: Diagnosis not present

## 2024-05-24 DIAGNOSIS — K529 Noninfective gastroenteritis and colitis, unspecified: Secondary | ICD-10-CM | POA: Diagnosis not present

## 2024-05-24 DIAGNOSIS — R9431 Abnormal electrocardiogram [ECG] [EKG]: Secondary | ICD-10-CM | POA: Diagnosis not present

## 2024-05-24 DIAGNOSIS — A419 Sepsis, unspecified organism: Secondary | ICD-10-CM | POA: Diagnosis not present

## 2024-05-24 LAB — CBC WITH DIFFERENTIAL/PLATELET
Abs Immature Granulocytes: 0.05 K/uL (ref 0.00–0.07)
Basophils Absolute: 0 K/uL (ref 0.0–0.1)
Basophils Relative: 0 %
Eosinophils Absolute: 0 K/uL (ref 0.0–0.5)
Eosinophils Relative: 0 %
HCT: 38.9 % — ABNORMAL LOW (ref 39.0–52.0)
Hemoglobin: 13.7 g/dL (ref 13.0–17.0)
Immature Granulocytes: 0 %
Lymphocytes Relative: 15 %
Lymphs Abs: 1.9 K/uL (ref 0.7–4.0)
MCH: 26.7 pg (ref 26.0–34.0)
MCHC: 35.2 g/dL (ref 30.0–36.0)
MCV: 75.7 fL — ABNORMAL LOW (ref 80.0–100.0)
Monocytes Absolute: 1.3 K/uL — ABNORMAL HIGH (ref 0.1–1.0)
Monocytes Relative: 10 %
Neutro Abs: 9.6 K/uL — ABNORMAL HIGH (ref 1.7–7.7)
Neutrophils Relative %: 75 %
Platelets: 282 K/uL (ref 150–400)
RBC: 5.14 MIL/uL (ref 4.22–5.81)
RDW: 14.5 % (ref 11.5–15.5)
WBC: 12.8 K/uL — ABNORMAL HIGH (ref 4.0–10.5)
nRBC: 0 % (ref 0.0–0.2)

## 2024-05-24 LAB — COMPREHENSIVE METABOLIC PANEL WITH GFR
ALT: 14 U/L (ref 0–44)
AST: 22 U/L (ref 15–41)
Albumin: 3.6 g/dL (ref 3.5–5.0)
Alkaline Phosphatase: 39 U/L (ref 38–126)
Anion gap: 11 (ref 5–15)
BUN: 11 mg/dL (ref 6–20)
CO2: 24 mmol/L (ref 22–32)
Calcium: 9 mg/dL (ref 8.9–10.3)
Chloride: 105 mmol/L (ref 98–111)
Creatinine, Ser: 1.01 mg/dL (ref 0.61–1.24)
GFR, Estimated: >60 mL/min (ref >60)
Glucose, Bld: 113 mg/dL — ABNORMAL HIGH (ref 70–99)
Potassium: 3.4 mmol/L — ABNORMAL LOW (ref 3.5–5.1)
Sodium: 140 mmol/L (ref 135–145)
Total Bilirubin: 0.8 mg/dL (ref 0.0–1.2)
Total Protein: 6.5 g/dL (ref 6.5–8.1)

## 2024-05-24 LAB — SACCHAROMYCES CEREVISIAE ANTIBODIES, IGG AND IGA
Saccharomyces cerevisiae, IgA: <20 U (ref 0.0–24.9)
Saccharomyces cerevisiae, IgG: <20 U (ref 0.0–24.9)

## 2024-05-24 LAB — MAGNESIUM: Magnesium: 2 mg/dL (ref 1.7–2.4)

## 2024-05-24 MED ORDER — OXYCODONE HCL 5 MG PO TABS
5.0000 mg | ORAL_TABLET | Freq: Four times a day (QID) | ORAL | Status: DC | PRN
  Administered 2024-05-24 – 2024-05-26 (×6): 5 mg via ORAL
  Filled 2024-05-24 (×6): qty 1

## 2024-05-24 MED ORDER — CYCLOBENZAPRINE HCL 5 MG PO TABS
5.0000 mg | ORAL_TABLET | Freq: Three times a day (TID) | ORAL | Status: DC | PRN
  Administered 2024-05-25 – 2024-05-26 (×2): 5 mg via ORAL
  Filled 2024-05-24 (×2): qty 1

## 2024-05-24 MED ORDER — SODIUM CHLORIDE 0.9 % IV SOLN
2.0000 g | Freq: Every day | INTRAVENOUS | Status: DC
  Administered 2024-05-24 – 2024-05-26 (×3): 2 g via INTRAVENOUS
  Filled 2024-05-24 (×3): qty 20

## 2024-05-24 MED ORDER — RIVAROXABAN 20 MG PO TABS
20.0000 mg | ORAL_TABLET | Freq: Every day | ORAL | Status: DC
  Administered 2024-05-24 – 2024-05-25 (×2): 20 mg via ORAL
  Filled 2024-05-24 (×2): qty 1

## 2024-05-24 MED ORDER — LIDOCAINE 5 % EX PTCH
1.0000 | MEDICATED_PATCH | CUTANEOUS | Status: DC
  Administered 2024-05-24 – 2024-05-26 (×3): 1 via TRANSDERMAL
  Filled 2024-05-24 (×3): qty 1

## 2024-05-24 MED ORDER — ACETAMINOPHEN 500 MG PO TABS
1000.0000 mg | ORAL_TABLET | Freq: Three times a day (TID) | ORAL | Status: DC
  Administered 2024-05-24 – 2024-05-25 (×4): 1000 mg via ORAL
  Filled 2024-05-24 (×5): qty 2

## 2024-05-24 MED ORDER — POTASSIUM CHLORIDE CRYS ER 20 MEQ PO TBCR
40.0000 meq | EXTENDED_RELEASE_TABLET | Freq: Once | ORAL | Status: AC
  Administered 2024-05-24: 40 meq via ORAL
  Filled 2024-05-24: qty 2

## 2024-05-24 MED ORDER — METRONIDAZOLE 500 MG PO TABS
500.0000 mg | ORAL_TABLET | Freq: Two times a day (BID) | ORAL | Status: DC
  Administered 2024-05-24 – 2024-05-26 (×5): 500 mg via ORAL
  Filled 2024-05-24 (×5): qty 1

## 2024-05-24 NOTE — Progress Notes (Signed)
 PROGRESS NOTE        PATIENT DETAILS Name: Jason [name] Age: 43 y.o. Sex: male Date of Birth: 07-Dec-1980 Admit Date: 05/22/2024 Admitting Physician Eva KATHEE Pore, DO PCP:Pcp, No  Brief Summary: Patient is a 43 y.o.  male chronic back/neck pain-who presented with fever/myalgias (mid/lower back)-of vomiting-thought to have sepsis physiology-subsequently admitted to the hospitalist service.  Significant events: 8/6>> admit to [facility]  Significant studies: 8/6>> CT chest/abdomen/pelvis: Mild wall thickening of the distal small bowel-infectious versus inflammatory enteritis.  No pneumonia. 8/7>> MRI C-spine/T-spine/LS spine: No discitis or osteomyelitis.  Significant microbiology data: 8/6>> COVID/influenza/RSV PCR: Negative 8/6>> blood culture: Negative  Procedures: None  Consults: None  Subjective: Feels much better-afebrile overnight.  No nausea, vomiting or diarrhea.  Had 1 BM overnight loose but not watery.  Objective: Vitals: Blood pressure 130/82, pulse (!) 56, temperature 99.3 F (37.4 C), temperature source Oral, resp. rate (!) 21, height 5' 8 (1.727 m), weight 67.3 kg, SpO2 99%.   Exam: Awake/alert Chest: Clear to auscultation CVS: S1-S2 regular Abdomen: Soft nontender nondistended Extremities: No edema Nonfocal exam  Pertinent Labs/Radiology:    Latest Ref Rng & Units 05/24/2024    6:38 AM 05/23/2024    6:01 AM 05/22/2024   10:27 PM  CBC  WBC 4.0 - 10.5 K/uL 12.8  10.2  12.3   Hemoglobin 13.0 - 17.0 g/dL 86.2  87.8  87.0   Hematocrit 39.0 - 52.0 % 38.9  35.6  38.1   Platelets 150 - 400 K/uL 282  252  319     Lab Results  Component Value Date   NA 140 05/24/2024   K 3.4 (L) 05/24/2024   CL 105 05/24/2024   CO2 24 05/24/2024      Assessment/Plan: Severe sepsis Probably secondary to enteritis seen on CT-did have some GI symptoms-no other source apparent on extensive imaging studies.  All cultures negative so  far Thankfully clinically improved-afebrile overnight-no GI symptoms-looks a whole lot better-continues to have mild leukocytosis Back pain somewhat better and almost close to baseline-abdominal pain has resolved. Stop Vanco/cefepime -Rocephin /Flagyl  and watch 24 hours-if continues to improve-suspect he can likely go home 8/9.=  QTc prolongation EKG with normal QTc this a.m.   Overall improved after correction of K/Mg levels.  Hypokalemia/hypomagnesemia Mild hypokalemia persists-replete again today Magnesium  level stable.   Follow electrolytes.  Hypocalcemia Resolved with correction of hypomagnesemia. Follow electrolytes periodically.  History of seizures Previously evaluated by neurology-not on any AEDs.  Outpatient EEG was negative.  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  Spoke with patient/mother at bedside-both want to resume-the family will help him with the course.  Discussed with pharmacy-he will be placed back on Xarelto .    Tobacco abuse Counseled prior to discharge  Cocaine use When mother was not at bedside on 8/7-this MD discussed cocaine use with patient-he acknowledges snorting cocaine intermittently He was counseled.  Chronic back pain-/P C6-7 foraminotomy 2021 Checked narcotic database-does not appear to be getting any narcotics as an outpatient Per patient-he deals with the pain. Will minimize narcotics as we have no plans to prescribe narcotics on discharge Will try scheduled Tylenol , Lidoderm  patch-as needed Flexeril .  Code status:   Code Status: Full Code   DVT Prophylaxis: SCDs Start: 05/23/24 0156 rivaroxaban  (XARELTO ) tablet 20 mg    Family Communication: Mother at bedside, spoke with her cousin who is a physician in the [place]  system at patient's/mother's request (his mother called cousin on the phone while I was in the room)   Disposition Plan: Status is: Inpatient Remains inpatient appropriate because: Severity of illness   Planned Discharge Destination:Home   Diet: Diet Order             Diet regular Room service appropriate? Yes; Fluid consistency: Thin  Diet effective now                     Antimicrobial agents: Anti-infectives (From admission, onward)    Start     Dose/Rate Route Frequency Ordered Stop   05/24/24 1000  cefTRIAXone  (ROCEPHIN ) 2 g in sodium chloride  0.9 % 100 mL IVPB        2 g 200 mL/hr over 30 Minutes Intravenous Daily 05/24/24 0831     05/24/24 1000  metroNIDAZOLE  (FLAGYL ) tablet 500 mg        500 mg Oral Every 12 hours 05/24/24 0831     05/23/24 1000  metroNIDAZOLE  (FLAGYL ) IVPB 500 mg  Status:  Discontinued        500 mg 100 mL/hr over 60 Minutes Intravenous Every 12 hours 05/23/24 0304 05/24/24 0830   05/23/24 1000  vancomycin  (VANCOCIN ) IVPB 1000 mg/200 mL premix  Status:  Discontinued        1,000 mg 200 mL/hr over 60 Minutes Intravenous Every 12 hours 05/23/24 0444 05/24/24 0830   05/23/24 0600  ceFEPIme  (MAXIPIME ) 2 g in sodium chloride  0.9 % 100 mL IVPB  Status:  Discontinued        2 g 200 mL/hr over 30 Minutes Intravenous Every 8 hours 05/23/24 0443 05/24/24 0830   05/22/24 2130  ceFEPIme  (MAXIPIME ) 2 g in sodium chloride  0.9 % 100 mL IVPB        2 g 200 mL/hr over 30 Minutes Intravenous  Once 05/22/24 2119 05/22/24 2349   05/22/24 2130  metroNIDAZOLE  (FLAGYL ) IVPB 500 mg        500 mg 100 mL/hr over 60 Minutes Intravenous  Once 05/22/24 2119 05/22/24 2349   05/22/24 2130  vancomycin  (VANCOCIN ) IVPB 1000 mg/200 mL premix        1,000 mg 200 mL/hr over 60 Minutes Intravenous  Once 05/22/24 2119 05/22/24 2349        MEDICATIONS: Scheduled Meds:  metroNIDAZOLE   500 mg Oral Q12H   rivaroxaban   20 mg Oral Q supper   Continuous Infusions:   cefTRIAXone  (ROCEPHIN )  IV     PRN Meds:.acetaminophen  **OR** acetaminophen , diazepam  **OR** LORazepam , HYDROmorphone  (DILAUDID ) injection, melatonin, naLOXone  (NARCAN )  injection, nicotine , nicotine  polacrilex, mouth rinse   I have personally reviewed following labs and imaging studies  LABORATORY DATA: CBC: Recent Labs  Lab 05/22/24 2227 05/23/24 0601 05/24/24 0638  WBC 12.3* 10.2 12.8*  NEUTROABS 10.7* 8.0* 9.6*  HGB 12.9* 12.1* 13.7  HCT 38.1* 35.6* 38.9*  MCV 77.9* 77.7* 75.7*  PLT 319 252 282    Basic Metabolic Panel: Recent Labs  Lab 05/22/24 2227 05/23/24 0601 05/24/24 0638  NA 141 139 140  K 3.5 3.1* 3.4*  CL 103 113* 105  CO2 22 21* 24  GLUCOSE 128* 94 113*  BUN 13 11 11   CREATININE 1.11 1.05 1.01  CALCIUM  9.1 7.9* 9.0  MG  --  1.4* 2.0    GFR: Estimated Creatinine Clearance: 90.7 mL/min (by C-G formula based on SCr of 1.01 mg/dL).  Liver Function Tests: Recent Labs  Lab 05/22/24 2227 05/23/24 0601 05/24/24 0638  AST 19 16 22   ALT 14 13 14   ALKPHOS 47 36* 39  BILITOT 0.9 0.9 0.8  PROT 6.8 6.1* 6.5  ALBUMIN 3.7 3.0* 3.6   No results for input(s): LIPASE, AMYLASE in the last 168 hours. No results for input(s): AMMONIA in the last 168 hours.  Coagulation Profile: Recent Labs  Lab 05/22/24 2227  INR 1.2    Cardiac Enzymes: Recent Labs  Lab 05/23/24 0601  CKTOTAL 222    BNP (last 3 results) No results for input(s): PROBNP in the last 8760 hours.  Lipid Profile: No results for input(s): CHOL, HDL, LDLCALC, TRIG, CHOLHDL, LDLDIRECT in the last 72 hours.  Thyroid  Function Tests: No results for input(s): TSH, T4TOTAL, FREET4, T3FREE, THYROIDAB in the last 72 hours.  Anemia Panel: No results for input(s): VITAMINB12, FOLATE, FERRITIN, TIBC, IRON, RETICCTPCT in the last 72 hours.  Urine analysis:    Component Value Date/Time   COLORURINE YELLOW 05/22/2024 2242   APPEARANCEUR HAZY (A)  05/22/2024 2242   APPEARANCEUR Hazy 01/13/2014 1418   LABSPEC 1.023 05/22/2024 2242   LABSPEC 1.028 01/13/2014 1418   PHURINE 5.0 05/22/2024 2242   GLUCOSEU NEGATIVE 05/22/2024 2242   GLUCOSEU NEGATIVE 03/23/2016 1215   HGBUR MODERATE (A) 05/22/2024 2242   BILIRUBINUR NEGATIVE 05/22/2024 2242   BILIRUBINUR negative 12/11/2018 1645   BILIRUBINUR Negative 01/13/2014 1418   KETONESUR 20 (A) 05/22/2024 2242   PROTEINUR 100 (A) 05/22/2024 2242   UROBILINOGEN 0.2 12/11/2018 1645   UROBILINOGEN 0.2 03/23/2016 1215   NITRITE NEGATIVE 05/22/2024 2242   LEUKOCYTESUR NEGATIVE 05/22/2024 2242   LEUKOCYTESUR Negative 01/13/2014 1418    Sepsis Labs: Lactic Acid, Venous    Component Value Date/Time   LATICACIDVEN 4.1 (HH) 05/23/2024 0601    MICROBIOLOGY: Recent Results (from the past 240 hours)  Blood Culture (routine x 2)     Status: None (Preliminary result)   Collection Time: 05/22/24  9:19 PM   Specimen: BLOOD LEFT HAND  Result Value Ref Range Status   Specimen Description BLOOD LEFT HAND  Final   Special Requests   Final    BOTTLES DRAWN AEROBIC ONLY Blood Culture results may not be optimal due to an inadequate volume of blood received in culture bottles   Culture   Final    NO GROWTH 2 DAYS Performed at [facility], [address] [place], [place] [address]    Report Status PENDING  Incomplete  Resp panel by RT-PCR (RSV, Flu A&B, Covid) Anterior Nasal Swab     Status: None   Collection Time: 05/22/24 10:07 PM   Specimen: Anterior Nasal Swab  Result Value Ref Range Status   SARS Coronavirus 2 by RT PCR NEGATIVE NEGATIVE Final   Influenza A by PCR NEGATIVE NEGATIVE Final   Influenza B by PCR NEGATIVE NEGATIVE Final    Comment: (NOTE) The Xpert Xpress SARS-CoV-2/FLU/RSV plus assay is intended as an aid in the diagnosis of influenza from Nasopharyngeal swab specimens and should not be used as a sole basis for treatment. Nasal washings and aspirates are unacceptable for  Xpert Xpress SARS-CoV-2/FLU/RSV testing.  Fact Sheet for Patients: BloggerCourse.com  Fact Sheet for Healthcare Providers: SeriousBroker.it  This test is not yet approved or cleared by the [place]  FDA and has  been authorized for detection and/or diagnosis of SARS-CoV-2 by FDA under an Emergency Use Authorization (EUA). This EUA will remain in effect (meaning this test can be used) for the duration of the COVID-19 declaration under Section 564(b)(1) of the Act, 21 U.S.C. section 360bbb-3(b)(1), unless the authorization is terminated or revoked.     Resp Syncytial Virus by PCR NEGATIVE NEGATIVE Final    Comment: (NOTE) Fact Sheet for Patients: BloggerCourse.com  Fact Sheet for Healthcare Providers: SeriousBroker.it  This test is not yet approved or cleared by the [place]  FDA and has been authorized for detection and/or diagnosis of SARS-CoV-2 by FDA under an Emergency Use Authorization (EUA). This EUA will remain in effect (meaning this test can be used) for the duration of the COVID-19 declaration under Section 564(b)(1) of the Act, 21 U.S.C. section 360bbb-3(b)(1), unless the authorization is terminated or revoked.  Performed at [facility], [address] [place], [place] [address]   Blood Culture (routine x 2)     Status: None (Preliminary result)   Collection Time: 05/22/24 10:27 PM   Specimen: BLOOD  Result Value Ref Range Status   Specimen Description BLOOD BLOOD RIGHT ARM  Final   Special Requests   Final    BOTTLES DRAWN AEROBIC AND ANAEROBIC Blood Culture results may not be optimal due to an inadequate volume of blood received in culture bottles   Culture   Final    NO GROWTH 2 DAYS Performed at [facility], [address] [place], [place] [address]    Report Status PENDING  Incomplete    RADIOLOGY STUDIES/RESULTS: MR Cervical Spine  W and Wo Contrast Result Date: 05/23/2024 CLINICAL DATA:  Low back pain, prior surgery, new symptoms; Osteomyelitis, cervical; Mid-back pain EXAM: MRI CERVICAL, THORACIC AND LUMBAR SPINE WITHOUT AND WITH CONTRAST TECHNIQUE: Multiplanar and multiecho pulse sequences of the cervical spine, to include the craniocervical junction and cervicothoracic junction, and thoracic and lumbar spine, were obtained without and with intravenous contrast. CONTRAST:  8mL GADAVIST  GADOBUTROL  1 MMOL/ML IV SOLN COMPARISON:  CT chest/abdomen/pelvis 05/22/2024. MRI cervical and lumbar spine 05/09/2020. FINDINGS: MRI CERVICAL SPINE FINDINGS Mild to moderately motion limited study.  Within this limitation: Alignment: Normal. Vertebrae: No visible in marrow edema to suggest acute fracture or discitis/osteomyelitis. No suspicious bone lesions. Vertebral body heights are maintained. No abnormal enhancement. Cord: Normal cord signal. Posterior Fossa, vertebral arteries, paraspinal tissues: Negative. Disc levels: C2-C3: No significant disc protrusion, foraminal stenosis, or canal stenosis. C3-C4: Uncovertebral hypertrophy bilaterally with mild right foraminal stenosis. No significant canal or left foraminal stenosis. C4-C5: Left greater than right uncovertebral hypertrophy contributes to mild left foraminal stenosis. No significant canal or right foraminal stenosis. C5-C6: Posterior disc osteophyte complex and bilateral facet and uncovertebral hypertrophy. Resulting moderate right and mild left foraminal stenosis. Patent canal. C6-C7: Posterior disc osteophyte complex with bilateral facet and uncovertebral hypertrophy. Suspected postoperative changes on the left including left laminectomy but motion limits assessment. The foramina appear patent but evaluation is limited. C7-T1: Right greater than left facet and uncovertebral hypertrophy. Mild right foraminal stenosis. MRI THORACIC SPINE FINDINGS Alignment:  Normal. Vertebrae: No marrow edema to  suggest acute fracture or discitis/osteomyelitis. No suspicious bone lesions. No abnormal enhancement. Cord:  Normal cord signal.  No abnormal enhancement. Paraspinal and other soft tissues: Unremarkable. Disc levels: No significant canal stenosis.  No high-grade foraminal stenosis. MRI LUMBAR SPINE FINDINGS Segmentation:  Normal. Alignment:  Physiologic. Vertebrae: Inferior L5 endplate degenerative/discogenic signal changes. Otherwise, no marrow  edema to suggest acute fracture or discitis/osteomyelitis. No suspicious bone lesions. No abnormal enhancement. Conus medullaris and cauda equina: Conus extends to the L1 level. Conus and cauda equina appear normal. No abnormal enhancement. Paraspinal and other soft tissues: Unremarkable. Disc levels: T12-L1: No significant disc protrusion, foraminal stenosis, or canal stenosis. L1-L2: No significant disc protrusion, foraminal stenosis, or canal stenosis. L2-L3: No significant disc protrusion, foraminal stenosis, or canal stenosis. L3-L4: No significant disc protrusion, foraminal stenosis, or canal stenosis. L4-L5: Mild broad disc bulge. Bilateral laminectomies. No significant canal stenosis. Similar mild bilateral foraminal stenosis. L5-S1: Similar small central disc protrusion without significant canal or foraminal stenosis. IMPRESSION: 1. No evidence of acute abnormality or discitis/osteomyelitis. 2. At C5-C6, similar moderate right and mild left foraminal stenosis. 3. At C6-C7, improved left foraminal stenosis status post left laminectomy although evaluation is limited due to motion at this level. 4. Similar mild bilateral foraminal stenosis at L4-L5. 5. No significant canal or foraminal stenosis in the thoracic spine. Electronically Signed   By: Gilmore GORMAN Molt M.D.   On: 05/23/2024 02:29   MR THORACIC SPINE W WO CONTRAST Result Date: 05/23/2024 CLINICAL DATA:  Low back pain, prior surgery, new symptoms; Osteomyelitis, cervical; Mid-back pain EXAM: MRI CERVICAL,  THORACIC AND LUMBAR SPINE WITHOUT AND WITH CONTRAST TECHNIQUE: Multiplanar and multiecho pulse sequences of the cervical spine, to include the craniocervical junction and cervicothoracic junction, and thoracic and lumbar spine, were obtained without and with intravenous contrast. CONTRAST:  8mL GADAVIST  GADOBUTROL  1 MMOL/ML IV SOLN COMPARISON:  CT chest/abdomen/pelvis 05/22/2024. MRI cervical and lumbar spine 05/09/2020. FINDINGS: MRI CERVICAL SPINE FINDINGS Mild to moderately motion limited study.  Within this limitation: Alignment: Normal. Vertebrae: No visible in marrow edema to suggest acute fracture or discitis/osteomyelitis. No suspicious bone lesions. Vertebral body heights are maintained. No abnormal enhancement. Cord: Normal cord signal. Posterior Fossa, vertebral arteries, paraspinal tissues: Negative. Disc levels: C2-C3: No significant disc protrusion, foraminal stenosis, or canal stenosis. C3-C4: Uncovertebral hypertrophy bilaterally with mild right foraminal stenosis. No significant canal or left foraminal stenosis. C4-C5: Left greater than right uncovertebral hypertrophy contributes to mild left foraminal stenosis. No significant canal or right foraminal stenosis. C5-C6: Posterior disc osteophyte complex and bilateral facet and uncovertebral hypertrophy. Resulting moderate right and mild left foraminal stenosis. Patent canal. C6-C7: Posterior disc osteophyte complex with bilateral facet and uncovertebral hypertrophy. Suspected postoperative changes on the left including left laminectomy but motion limits assessment. The foramina appear patent but evaluation is limited. C7-T1: Right greater than left facet and uncovertebral hypertrophy. Mild right foraminal stenosis. MRI THORACIC SPINE FINDINGS Alignment:  Normal. Vertebrae: No marrow edema to suggest acute fracture or discitis/osteomyelitis. No suspicious bone lesions. No abnormal enhancement. Cord:  Normal cord signal.  No abnormal enhancement.  Paraspinal and other soft tissues: Unremarkable. Disc levels: No significant canal stenosis.  No high-grade foraminal stenosis. MRI LUMBAR SPINE FINDINGS Segmentation:  Normal. Alignment:  Physiologic. Vertebrae: Inferior L5 endplate degenerative/discogenic signal changes. Otherwise, no marrow edema to suggest acute fracture or discitis/osteomyelitis. No suspicious bone lesions. No abnormal enhancement. Conus medullaris and cauda equina: Conus extends to the L1 level. Conus and cauda equina appear normal. No abnormal enhancement. Paraspinal and other soft tissues: Unremarkable. Disc levels: T12-L1: No significant disc protrusion, foraminal stenosis, or canal stenosis. L1-L2: No significant disc protrusion, foraminal stenosis, or canal stenosis. L2-L3: No significant disc protrusion, foraminal stenosis, or canal stenosis. L3-L4: No significant disc protrusion, foraminal stenosis, or canal stenosis. L4-L5: Mild broad disc bulge. Bilateral laminectomies. No significant canal  stenosis. Similar mild bilateral foraminal stenosis. L5-S1: Similar small central disc protrusion without significant canal or foraminal stenosis. IMPRESSION: 1. No evidence of acute abnormality or discitis/osteomyelitis. 2. At C5-C6, similar moderate right and mild left foraminal stenosis. 3. At C6-C7, improved left foraminal stenosis status post left laminectomy although evaluation is limited due to motion at this level. 4. Similar mild bilateral foraminal stenosis at L4-L5. 5. No significant canal or foraminal stenosis in the thoracic spine. Electronically Signed   By: Gilmore GORMAN Molt M.D.   On: 05/23/2024 02:29   MR Lumbar Spine W Wo Contrast Result Date: 05/23/2024 CLINICAL DATA:  Low back pain, prior surgery, new symptoms; Osteomyelitis, cervical; Mid-back pain EXAM: MRI CERVICAL, THORACIC AND LUMBAR SPINE WITHOUT AND WITH CONTRAST TECHNIQUE: Multiplanar and multiecho pulse sequences of the cervical spine, to include the craniocervical  junction and cervicothoracic junction, and thoracic and lumbar spine, were obtained without and with intravenous contrast. CONTRAST:  8mL GADAVIST  GADOBUTROL  1 MMOL/ML IV SOLN COMPARISON:  CT chest/abdomen/pelvis 05/22/2024. MRI cervical and lumbar spine 05/09/2020. FINDINGS: MRI CERVICAL SPINE FINDINGS Mild to moderately motion limited study.  Within this limitation: Alignment: Normal. Vertebrae: No visible in marrow edema to suggest acute fracture or discitis/osteomyelitis. No suspicious bone lesions. Vertebral body heights are maintained. No abnormal enhancement. Cord: Normal cord signal. Posterior Fossa, vertebral arteries, paraspinal tissues: Negative. Disc levels: C2-C3: No significant disc protrusion, foraminal stenosis, or canal stenosis. C3-C4: Uncovertebral hypertrophy bilaterally with mild right foraminal stenosis. No significant canal or left foraminal stenosis. C4-C5: Left greater than right uncovertebral hypertrophy contributes to mild left foraminal stenosis. No significant canal or right foraminal stenosis. C5-C6: Posterior disc osteophyte complex and bilateral facet and uncovertebral hypertrophy. Resulting moderate right and mild left foraminal stenosis. Patent canal. C6-C7: Posterior disc osteophyte complex with bilateral facet and uncovertebral hypertrophy. Suspected postoperative changes on the left including left laminectomy but motion limits assessment. The foramina appear patent but evaluation is limited. C7-T1: Right greater than left facet and uncovertebral hypertrophy. Mild right foraminal stenosis. MRI THORACIC SPINE FINDINGS Alignment:  Normal. Vertebrae: No marrow edema to suggest acute fracture or discitis/osteomyelitis. No suspicious bone lesions. No abnormal enhancement. Cord:  Normal cord signal.  No abnormal enhancement. Paraspinal and other soft tissues: Unremarkable. Disc levels: No significant canal stenosis.  No high-grade foraminal stenosis. MRI LUMBAR SPINE FINDINGS  Segmentation:  Normal. Alignment:  Physiologic. Vertebrae: Inferior L5 endplate degenerative/discogenic signal changes. Otherwise, no marrow edema to suggest acute fracture or discitis/osteomyelitis. No suspicious bone lesions. No abnormal enhancement. Conus medullaris and cauda equina: Conus extends to the L1 level. Conus and cauda equina appear normal. No abnormal enhancement. Paraspinal and other soft tissues: Unremarkable. Disc levels: T12-L1: No significant disc protrusion, foraminal stenosis, or canal stenosis. L1-L2: No significant disc protrusion, foraminal stenosis, or canal stenosis. L2-L3: No significant disc protrusion, foraminal stenosis, or canal stenosis. L3-L4: No significant disc protrusion, foraminal stenosis, or canal stenosis. L4-L5: Mild broad disc bulge. Bilateral laminectomies. No significant canal stenosis. Similar mild bilateral foraminal stenosis. L5-S1: Similar small central disc protrusion without significant canal or foraminal stenosis. IMPRESSION: 1. No evidence of acute abnormality or discitis/osteomyelitis. 2. At C5-C6, similar moderate right and mild left foraminal stenosis. 3. At C6-C7, improved left foraminal stenosis status post left laminectomy although evaluation is limited due to motion at this level. 4. Similar mild bilateral foraminal stenosis at L4-L5. 5. No significant canal or foraminal stenosis in the thoracic spine. Electronically Signed   By: Gilmore GORMAN Molt M.D.   On: 05/23/2024 02:29  CT CHEST ABDOMEN PELVIS W CONTRAST Result Date: 05/22/2024 CLINICAL DATA:  Dysuria, urinary frequency, cystitis, altered level of consciousness, sepsis EXAM: CT CHEST, ABDOMEN, AND PELVIS WITH CONTRAST TECHNIQUE: Multidetector CT imaging of the chest, abdomen and pelvis was performed following the standard protocol during bolus administration of intravenous contrast. RADIATION DOSE REDUCTION: This exam was performed according to the departmental dose-optimization program which  includes automated exposure control, adjustment of the mA and/or kV according to patient size and/or use of iterative reconstruction technique. CONTRAST:  75mL OMNIPAQUE  IOHEXOL  350 MG/ML SOLN COMPARISON:  05/04/2023, 05/22/2024 FINDINGS: CT CHEST FINDINGS Cardiovascular: The heart is unremarkable without pericardial effusion. No evidence of thoracic aortic aneurysm or dissection. Mediastinum/Nodes: No enlarged mediastinal, hilar, or axillary lymph nodes. Thyroid  gland, trachea, and esophagus demonstrate no significant findings. Lungs/Pleura: No acute airspace disease, effusion, or pneumothorax. Chronic scarring at the right lung base. Subpleural emphysematous changes within the upper lobes. Central airways are patent. Musculoskeletal: No acute or destructive bony abnormalities. Reconstructed images demonstrate no additional findings. CT ABDOMEN PELVIS FINDINGS Hepatobiliary: No focal liver abnormality is seen. No gallstones, gallbladder wall thickening, or biliary dilatation. Pancreas: Unremarkable. No pancreatic ductal dilatation or surrounding inflammatory changes. Spleen: Normal in size without focal abnormality. Adrenals/Urinary Tract: Adrenal glands are unremarkable. Kidneys are normal, without renal calculi, focal lesion, or hydronephrosis. Bladder is unremarkable. Stomach/Bowel: No evidence of bowel obstruction or ileus. Mild wall thickening throughout the distal small bowel could reflect inflammatory or infectious enteritis. The appendix is not well visualized. Vascular/Lymphatic: No significant vascular findings are present. No enlarged abdominal or pelvic lymph nodes. Reproductive: Prostate is unremarkable. Other: No free fluid or free intraperitoneal gas. No abdominal wall hernia. Musculoskeletal: No acute or destructive bony abnormalities. Reconstructed images demonstrate no additional findings. IMPRESSION: 1. Mild wall thickening of the distal small bowel, compatible with inflammatory or infectious  enteritis. No bowel obstruction or ileus. 2. Chronic scarring at the right lung base. No acute airspace disease. Electronically Signed   By: Ozell Daring M.D.   On: 05/22/2024 23:43   DG Chest Port 1 View Result Date: 05/22/2024 CLINICAL DATA:  Sepsis EXAM: PORTABLE CHEST 1 VIEW COMPARISON:  11/09/2019 FINDINGS: The heart size and mediastinal contours are within normal limits. Both lungs are clear. The visualized skeletal structures are unremarkable. IMPRESSION: No active disease. Electronically Signed   By: Ozell Daring M.D.   On: 05/22/2024 21:59     LOS: 1 day   Donalda Applebaum, MD  [facility]    To contact the attending provider between 7A-7P or the covering provider during after hours 7P-7A, please log into the web site [email or web address] and access using universal Brenda password for that web site. If you do not have the password, please call the hospital operator.  05/24/2024, 9:06 AM

## 2024-05-24 NOTE — Plan of Care (Signed)

## 2024-05-24 NOTE — Plan of Care (Signed)
  Problem: Education: Goal: Knowledge of General Education information will improve Description: Including pain rating scale, medication(s)/side effects and non-pharmacologic comfort measures 05/24/2024 1910 by Terryl Lauraine LABOR, RN Outcome: Progressing 05/24/2024 1909 by Terryl Lauraine LABOR, RN Outcome: Progressing   Problem: Health Behavior/Discharge Planning: Goal: Ability to manage health-related needs will improve 05/24/2024 1910 by Terryl Lauraine LABOR, RN Outcome: Progressing 05/24/2024 1909 by Terryl Lauraine LABOR, RN Outcome: Progressing   Problem: Clinical Measurements: Goal: Ability to maintain clinical measurements within normal limits will improve 05/24/2024 1910 by Terryl Lauraine LABOR, RN Outcome: Progressing 05/24/2024 1909 by Terryl Lauraine LABOR, RN Outcome: Progressing Goal: Will remain free from infection 05/24/2024 1910 by Terryl Lauraine LABOR, RN Outcome: Progressing 05/24/2024 1909 by Terryl Lauraine LABOR, RN Outcome: Progressing Goal: Diagnostic test results will improve 05/24/2024 1910 by Terryl Lauraine LABOR, RN Outcome: Progressing 05/24/2024 1909 by Terryl Lauraine LABOR, RN Outcome: Progressing Goal: Respiratory complications will improve 05/24/2024 1910 by Terryl Lauraine LABOR, RN Outcome: Progressing 05/24/2024 1909 by Terryl Lauraine LABOR, RN Outcome: Progressing Goal: Cardiovascular complication will be avoided 05/24/2024 1910 by Terryl Lauraine LABOR, RN Outcome: Progressing 05/24/2024 1909 by Terryl Lauraine LABOR, RN Outcome: Progressing   Problem: Activity: Goal: Risk for activity intolerance will decrease 05/24/2024 1910 by Terryl Lauraine LABOR, RN Outcome: Progressing 05/24/2024 1909 by Terryl Lauraine LABOR, RN Outcome: Progressing   Problem: Nutrition: Goal: Adequate nutrition will be maintained 05/24/2024 1910 by Terryl Lauraine LABOR, RN Outcome: Progressing 05/24/2024 1909 by Terryl Lauraine LABOR, RN Outcome: Progressing   Problem: Coping: Goal: Level of anxiety will decrease 05/24/2024 1910 by Terryl Lauraine LABOR, RN Outcome:  Progressing 05/24/2024 1909 by Terryl Lauraine LABOR, RN Outcome: Progressing   Problem: Elimination: Goal: Will not experience complications related to bowel motility 05/24/2024 1910 by Terryl Lauraine LABOR, RN Outcome: Progressing 05/24/2024 1909 by Terryl Lauraine LABOR, RN Outcome: Progressing Goal: Will not experience complications related to urinary retention 05/24/2024 1910 by Terryl Lauraine LABOR, RN Outcome: Progressing 05/24/2024 1909 by Terryl Lauraine LABOR, RN Outcome: Progressing   Problem: Pain Managment: Goal: General experience of comfort will improve and/or be controlled 05/24/2024 1910 by Terryl Lauraine LABOR, RN Outcome: Progressing 05/24/2024 1909 by Terryl Lauraine LABOR, RN Outcome: Progressing   Problem: Safety: Goal: Ability to remain free from injury will improve 05/24/2024 1910 by Terryl Lauraine LABOR, RN Outcome: Progressing 05/24/2024 1909 by Terryl Lauraine LABOR, RN Outcome: Progressing   Problem: Skin Integrity: Goal: Risk for impaired skin integrity will decrease 05/24/2024 1910 by Terryl Lauraine LABOR, RN Outcome: Progressing 05/24/2024 1909 by Terryl Lauraine LABOR, RN Outcome: Progressing

## 2024-05-24 NOTE — TOC Benefit Eligibility Note (Signed)
 Pharmacy Patient Advocate Encounter  Insurance verification completed.    The patient is insured through [employer] and [facility].     Ran test claim for Xarelto  20mg  and the current 30 day co-pay is $0.  Ran test claim for Eliquis 5mg  and the current 30 day co-pay is $0.   This test claim was processed through [employer]- copay amounts may vary at other pharmacies due to [employer], or as the patient moves through the different stages of their insurance plan.

## 2024-05-25 ENCOUNTER — Other Ambulatory Visit (HOSPITAL_COMMUNITY): Payer: Self-pay

## 2024-05-25 DIAGNOSIS — R6520 Severe sepsis without septic shock: Secondary | ICD-10-CM | POA: Diagnosis not present

## 2024-05-25 DIAGNOSIS — A419 Sepsis, unspecified organism: Secondary | ICD-10-CM | POA: Diagnosis not present

## 2024-05-25 LAB — BASIC METABOLIC PANEL WITH GFR
Anion gap: 11 (ref 5–15)
BUN: 7 mg/dL (ref 6–20)
CO2: 22 mmol/L (ref 22–32)
Calcium: 8.8 mg/dL — ABNORMAL LOW (ref 8.9–10.3)
Chloride: 103 mmol/L (ref 98–111)
Creatinine, Ser: 1.05 mg/dL (ref 0.61–1.24)
GFR, Estimated: >60 mL/min (ref >60)
Glucose, Bld: 103 mg/dL — ABNORMAL HIGH (ref 70–99)
Potassium: 3.4 mmol/L — ABNORMAL LOW (ref 3.5–5.1)
Sodium: 136 mmol/L (ref 135–145)

## 2024-05-25 LAB — CBC
HCT: 37.4 % — ABNORMAL LOW (ref 39.0–52.0)
Hemoglobin: 13.1 g/dL (ref 13.0–17.0)
MCH: 26.8 pg (ref 26.0–34.0)
MCHC: 35 g/dL (ref 30.0–36.0)
MCV: 76.5 fL — ABNORMAL LOW (ref 80.0–100.0)
Platelets: 252 K/uL (ref 150–400)
RBC: 4.89 MIL/uL (ref 4.22–5.81)
RDW: 14.3 % (ref 11.5–15.5)
WBC: 9 K/uL (ref 4.0–10.5)
nRBC: 0 % (ref 0.0–0.2)

## 2024-05-25 LAB — MAGNESIUM: Magnesium: 1.8 mg/dL (ref 1.7–2.4)

## 2024-05-25 MED ORDER — RIVAROXABAN 20 MG PO TABS
20.0000 mg | ORAL_TABLET | Freq: Every day | ORAL | 0 refills | Status: AC
  Filled 2024-05-25: qty 30, 30d supply, fill #0

## 2024-05-25 MED ORDER — POTASSIUM CHLORIDE CRYS ER 20 MEQ PO TBCR
40.0000 meq | EXTENDED_RELEASE_TABLET | Freq: Once | ORAL | Status: AC
  Administered 2024-05-25: 40 meq via ORAL
  Filled 2024-05-25: qty 2

## 2024-05-25 MED ORDER — CEPHALEXIN 500 MG PO CAPS
500.0000 mg | ORAL_CAPSULE | Freq: Three times a day (TID) | ORAL | 0 refills | Status: AC
Start: 2024-05-25 — End: 2024-05-29
  Filled 2024-05-25: qty 12, 4d supply, fill #0

## 2024-05-25 MED ORDER — METRONIDAZOLE 500 MG PO TABS
500.0000 mg | ORAL_TABLET | Freq: Two times a day (BID) | ORAL | 0 refills | Status: AC
  Filled 2024-05-25: qty 12, 6d supply, fill #0

## 2024-05-25 NOTE — Plan of Care (Signed)

## 2024-05-25 NOTE — Progress Notes (Signed)
 PROGRESS NOTE        PATIENT DETAILS Name: Jason [name] Age: 43 y.o. Sex: male Date of Birth: 04-Aug-1981 Admit Date: 05/22/2024 Admitting Physician Eva KATHEE Pore, DO PCP:Pcp, No  Brief Summary: Patient is a 43 y.o.  male chronic back/neck pain-who presented with fever/myalgias (mid/lower back)-of vomiting-thought to have sepsis physiology-subsequently admitted to the hospitalist service.  Significant events: 8/6>> admit to [facility]  Significant studies: 8/6>> CT chest/abdomen/pelvis: Mild wall thickening of the distal small bowel-infectious versus inflammatory enteritis.  No pneumonia. 8/7>> MRI C-spine/T-spine/LS spine: No discitis or osteomyelitis.  Significant microbiology data: 8/6>> COVID/influenza/RSV PCR: Negative 8/6>> blood culture: Negative  Procedures: None  Consults: None  Subjective:  Patient in bed, appears comfortable, denies any headache, no fever, no chest pain or pressure, no shortness of breath , will have some diarrhea and mild generalized abdominal pain. No new focal weakness.   Objective: Vitals: Blood pressure 137/89, pulse 64, temperature 98.8 F (37.1 C), temperature source Oral, resp. rate (!) 21, height 5' 8 (1.727 m), weight 68.2 kg, SpO2 94%.   Exam:  Awake Alert, No new F.N deficits, Normal affect [place].AT,PERRAL Supple Neck, No JVD,   Symmetrical Chest wall movement, Good air movement bilaterally, CTAB RRR,No Gallops, Rubs or new Murmurs,  +ve B.Sounds, Abd Soft, No tenderness,   No Cyanosis, Clubbing or edema    Assessment/Plan: Severe sepsis Probably secondary to enteritis seen on CT-did have some GI symptoms-no other source apparent on extensive imaging studies.  All cultures negative so far Thankfully clinically improved-afebrile overnight-no GI symptoms-looks a whole lot better-continues to have mild leukocytosis Back pain somewhat better and almost close to baseline-abdominal pain has  resolved. Stop Vanco/cefepime -continue on Rocephin /Flagyl  and watch 24 hours-if continues to improve-discharge in the next 1 to 2 days.  QTc prolongation EKG with normal QTc this a.m.   Overall improved after correction of K/Mg levels.  Hypokalemia/hypomagnesemia Mild hypokalemia persists-replete again today Magnesium  level stable.   Follow electrolytes.  Hypocalcemia Resolved with correction of hypomagnesemia. Follow electrolytes periodically.  History of seizures Previously evaluated by neurology-not on any AEDs.  Outpatient EEG was negative.  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  Spoke with patient/mother at bedside-both want to resume-the family will help him with the course.  Discussed with pharmacy-he will be placed back on Xarelto .    Tobacco abuse Counseled prior to discharge  Cocaine use When mother was not at bedside on 8/7-this MD discussed cocaine use with patient-he acknowledges snorting cocaine intermittently He was counseled.  Chronic back pain-/P C6-7 foraminotomy 2021 Checked narcotic database-does not appear to be getting any narcotics as an outpatient Per patient-he deals with the pain. Will minimize narcotics as we have no plans to prescribe narcotics on discharge Will try scheduled Tylenol , Lidoderm  patch-as needed Flexeril .  Code status:   Code Status: Full Code   DVT Prophylaxis: SCDs Start: 05/23/24 0156 rivaroxaban  (XARELTO ) tablet 20 mg    Family Communication: Mother at bedside, spoke with her cousin who is a physician in the [place] system at patient's/mother's request (his mother called cousin on the phone while I was in the room)   Disposition Plan: Status is: Inpatient  Remains inpatient appropriate because: Severity of  illness   Planned Discharge Destination:Home   Diet: Diet Order             Diet regular Room service appropriate? Yes; Fluid consistency: Thin  Diet effective now                    MEDICATIONS: Scheduled Meds:  acetaminophen   1,000 mg Oral Q8H   lidocaine   1 patch Transdermal Q24H   metroNIDAZOLE   500 mg Oral Q12H   potassium chloride   40 mEq Oral Once   rivaroxaban   20 mg Oral Q supper   Continuous Infusions:  cefTRIAXone  (ROCEPHIN )  IV 2 g (05/24/24 0942)   PRN Meds:.cyclobenzaprine , diazepam  **OR** LORazepam , melatonin, naLOXone  (NARCAN )  injection, nicotine , nicotine  polacrilex, mouth rinse, oxyCODONE    I have personally reviewed following labs and imaging studies  LABORATORY DATA:   Data Review:   Inpatient Medications  Scheduled Meds:  acetaminophen   1,000 mg Oral Q8H   lidocaine   1 patch Transdermal Q24H   metroNIDAZOLE   500 mg Oral Q12H   potassium chloride   40 mEq Oral Once   rivaroxaban   20 mg Oral Q supper   Continuous Infusions:  cefTRIAXone  (ROCEPHIN )  IV 2 g (05/24/24 0942)   PRN Meds:.cyclobenzaprine , diazepam  **OR** LORazepam , melatonin, naLOXone  (NARCAN )  injection, nicotine , nicotine  polacrilex, mouth rinse, oxyCODONE   DVT Prophylaxis  SCDs Start: 05/23/24 0156 rivaroxaban  (XARELTO ) tablet 20 mg    Recent Labs  Lab 05/22/24 2227 05/23/24 0601 05/24/24 0638 05/25/24 0529  WBC 12.3* 10.2 12.8* 9.0  HGB 12.9* 12.1* 13.7 13.1  HCT 38.1* 35.6* 38.9* 37.4*  PLT 319 252 282 252  MCV 77.9* 77.7* 75.7* 76.5*  MCH 26.4 26.4 26.7 26.8  MCHC 33.9 34.0 35.2 35.0  RDW 14.6 14.7 14.5 14.3  LYMPHSABS 1.0 1.3 1.9  --   MONOABS 0.6 0.9 1.3*  --   EOSABS 0.0 0.0 0.0  --   BASOSABS 0.0 0.0 0.0  --     Recent Labs  Lab 05/22/24 2227 05/23/24 0008 05/23/24 0601 05/24/24 0638 05/25/24 0529  NA 141  --  139 140 136  K 3.5  --  3.1* 3.4* 3.4*  CL 103  --  113* 105 103  CO2 22  --  21* 24 22  ANIONGAP 16*  --  5 11 11   GLUCOSE 128*   --  94 113* 103*  BUN 13  --  11 11 7   CREATININE 1.11  --  1.05 1.01 1.05  AST 19  --  16 22  --   ALT 14  --  13 14  --   ALKPHOS 47  --  36* 39  --   BILITOT 0.9  --  0.9 0.8  --   ALBUMIN 3.7  --  3.0* 3.6  --   CRP  --   --  <0.5  --   --   LATICACIDVEN 4.5* 6.8* 4.1*  --   --   INR 1.2  --   --   --   --   MG  --   --  1.4* 2.0 1.8  CALCIUM  9.1  --  7.9* 9.0 8.8*      Recent Labs  Lab 05/22/24 2227 05/23/24 0008 05/23/24 0601 05/24/24 0638 05/25/24 0529  CRP  --   --  <0.5  --   --   LATICACIDVEN 4.5* 6.8* 4.1*  --   --   INR 1.2  --   --   --   --  MG  --   --  1.4* 2.0 1.8  CALCIUM  9.1  --  7.9* 9.0 8.8*    --------------------------------------------------------------------------------------------------------------- Lab Results  Component Value Date   CHOL 152 06/10/2020   HDL 54 06/10/2020   LDLCALC 71 06/10/2020   TRIG 159 (H) 06/10/2020   CHOLHDL 2.8 06/10/2020    Lab Results  Component Value Date   HGBA1C 6.1 (H) 06/10/2020     Micro Results Recent Results (from the past 240 hours)  Blood Culture (routine x 2)     Status: None (Preliminary result)   Collection Time: 05/22/24  9:19 PM   Specimen: BLOOD LEFT HAND  Result Value Ref Range Status   Specimen Description BLOOD LEFT HAND  Final   Special Requests   Final    BOTTLES DRAWN AEROBIC ONLY Blood Culture results may not be optimal due to an inadequate volume of blood received in culture bottles   Culture   Final    NO GROWTH 3 DAYS Performed at [facility], [address] [place], [place] [address]    Report Status PENDING  Incomplete  Resp panel by RT-PCR (RSV, Flu A&B, Covid) Anterior Nasal Swab     Status: None   Collection Time: 05/22/24 10:07 PM   Specimen: Anterior Nasal Swab  Result Value Ref Range Status   SARS Coronavirus 2 by RT PCR NEGATIVE NEGATIVE Final   Influenza A by PCR NEGATIVE NEGATIVE Final   Influenza B by PCR NEGATIVE NEGATIVE Final    Comment:  (NOTE) The Xpert Xpress SARS-CoV-2/FLU/RSV plus assay is intended as an aid in the diagnosis of influenza from Nasopharyngeal swab specimens and should not be used as a sole basis for treatment. Nasal washings and aspirates are unacceptable for Xpert Xpress SARS-CoV-2/FLU/RSV testing.  Fact Sheet for Patients: BloggerCourse.com  Fact Sheet for Healthcare Providers: SeriousBroker.it  This test is not yet approved or cleared by the [place]  FDA and has been authorized for detection and/or diagnosis of SARS-CoV-2 by FDA under an Emergency Use Authorization (EUA). This EUA will remain in effect (meaning this test can be used) for the duration of the COVID-19 declaration under Section 564(b)(1) of the Act, 21 U.S.C. section 360bbb-3(b)(1), unless the authorization is terminated or revoked.     Resp Syncytial Virus by PCR NEGATIVE NEGATIVE Final    Comment: (NOTE) Fact Sheet for Patients: BloggerCourse.com  Fact Sheet for Healthcare Providers: SeriousBroker.it  This test is not yet approved or cleared by the [place]  FDA and has been authorized for detection and/or diagnosis of SARS-CoV-2 by FDA under an Emergency Use Authorization (EUA). This EUA will remain in effect (meaning this test can be used) for the duration of the COVID-19 declaration under Section 564(b)(1) of the Act, 21 U.S.C. section 360bbb-3(b)(1), unless the authorization is terminated or revoked.  Performed at [facility], [address], [place], [place] [address]   Blood Culture (routine x 2)     Status: None (Preliminary result)   Collection Time: 05/22/24 10:27 PM   Specimen: BLOOD  Result Value Ref Range Status   Specimen Description BLOOD BLOOD RIGHT ARM  Final   Special Requests   Final    BOTTLES DRAWN AEROBIC AND ANAEROBIC Blood Culture results may not be optimal due to an inadequate  volume of blood received in culture bottles   Culture   Final    NO GROWTH 3 DAYS Performed at [facility], [address] [place], [place] [address]  Report Status PENDING  Incomplete    Radiology Reports  No results found.    Signature  -   Lavada Stank M.D on 05/25/2024 at 9:09 AM   -  To page go to [email or web address]

## 2024-05-26 DIAGNOSIS — A419 Sepsis, unspecified organism: Secondary | ICD-10-CM | POA: Diagnosis not present

## 2024-05-26 DIAGNOSIS — R6520 Severe sepsis without septic shock: Secondary | ICD-10-CM | POA: Diagnosis not present

## 2024-05-26 LAB — BASIC METABOLIC PANEL WITH GFR
Anion gap: 9 (ref 5–15)
BUN: 8 mg/dL (ref 6–20)
CO2: 25 mmol/L (ref 22–32)
Calcium: 8.9 mg/dL (ref 8.9–10.3)
Chloride: 103 mmol/L (ref 98–111)
Creatinine, Ser: 1.05 mg/dL (ref 0.61–1.24)
GFR, Estimated: >60 mL/min (ref >60)
Glucose, Bld: 95 mg/dL (ref 70–99)
Potassium: 3.3 mmol/L — ABNORMAL LOW (ref 3.5–5.1)
Sodium: 137 mmol/L (ref 135–145)

## 2024-05-26 MED ORDER — POTASSIUM CHLORIDE CRYS ER 20 MEQ PO TBCR
40.0000 meq | EXTENDED_RELEASE_TABLET | Freq: Once | ORAL | Status: AC
  Administered 2024-05-26: 40 meq via ORAL
  Filled 2024-05-26: qty 2

## 2024-05-26 NOTE — Discharge Instructions (Signed)
 Follow with Primary MD Pcp, No in 7 days   Get CBC, CMP, Magnesium   -  checked next visit with your primary MD    Activity: As tolerated with Full fall precautions use walker/cane & assistance as needed  Disposition Home    Diet: Heart Healthy   Special Instructions: If you have smoked or chewed Tobacco  in the last 2 yrs please stop smoking, stop any regular Alcohol   and or any Recreational drug use.  On your next visit with your primary care physician please Get Medicines reviewed and adjusted.  Please request your Prim.MD to go over all Hospital Tests and Procedure/Radiological results at the follow up, please get all Hospital records sent to your Prim MD by signing hospital release before you go home.  If you experience worsening of your admission symptoms, develop shortness of breath, life threatening emergency, suicidal or homicidal thoughts you must seek medical attention immediately by calling 911 or calling your MD immediately  if symptoms less severe.  You Must read complete instructions/literature along with all the possible adverse reactions/side effects for all the Medicines you take and that have been prescribed to you. Take any new Medicines after you have completely understood and accpet all the possible adverse reactions/side effects.   Do not drive when taking Pain medications.  Do not take more than prescribed Pain, Sleep and Anxiety Medications  Wear Seat belts while driving.

## 2024-05-26 NOTE — Discharge Summary (Addendum)
 Jason [name] FMW:[id number] DOB: 09/24/1981 DOA: 05/22/2024  PCP: Pcp, No  Admit date: 05/22/2024  Discharge date: 05/26/2024  Admitted From: Home   Disposition:  Home   Recommendations for Outpatient Follow-up:   Follow up with PCP in 1-2 weeks  PCP Please obtain BMP/CBC, 2 view CXR in 1week,  (see Discharge instructions)   PCP Please follow up on the following pending results:    Home Health: None   Equipment/Devices: None  Consultations: None  Discharge Condition: Stable    CODE STATUS: Full    Diet Recommendation: Heart Healthy     Chief Complaint  Patient presents with   Fever   Emesis     Brief history of present illness from the day of admission and additional interim summary    43 y.o.  male chronic back/neck pain-who presented with fever/myalgias (mid/lower back)-of vomiting-thought to have sepsis physiology-subsequently admitted to the hospitalist service.   Significant events: 8/6>> admit to [facility]   Significant studies: 8/6>> CT chest/abdomen/pelvis: Mild wall thickening of the distal small bowel-infectious versus inflammatory enteritis.  No pneumonia. 8/7>> MRI C-spine/T-spine/LS spine: No discitis or osteomyelitis.   Significant microbiology data: 8/6>> COVID/influenza/RSV PCR: Negative 8/6>> blood culture: Negative                                                                 Hospital Course   Severe sepsis Probably secondary to enteritis seen on CT-did have some GI symptoms-no other source apparent on extensive imaging studies.  All cultures negative so far Thankfully clinically improved-afebrile overnight-no GI symptoms-looks a whole lot better-continues to have mild leukocytosis Back pain somewhat better and almost close to baseline-abdominal pain has resolved. Stop  Vanco/cefepime -continue on Rocephin /Flagyl  and watch 24 hours-improved will be discharged with oral antibiotics for a few more days with outpatient GI and PCP follow-up.   QTc prolongation EKG with normal QTc this a.m.   Overall improved after correction of K/Mg levels.   Hypokalemia/hypomagnesemia Replaced PCP to monitor.   Hypocalcemia  Resolved with correction of hypomagnesemia. Follow electrolytes periodically.   History of seizures Previously evaluated by neurology-not on any AEDs.  Outpatient EEG was negative.   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  Spoke with patient/mother at bedside-both want to resume-the family will help him with the course.  Discussed with pharmacy-he will be placed back on Xarelto .  1 month supply provided thereafter per PCP.   Tobacco abuse Counseled     Cocaine use When mother was not at bedside on 8/7-this MD discussed cocaine use with patient-he acknowledges snorting cocaine intermittently He was counseled to  quit.   Chronic back pain-/P C6-7 foraminotomy 2021 Chronic issue follow-up with PCP.    Discharge diagnosis     Principal Problem:   Severe sepsis (HCC) Active Problems:   Lactic acidosis   Enteritis   Prolonged QT interval   Tobacco abuse   Fever    Discharge instructions    Discharge Instructions     Diet - low sodium heart healthy   Complete by: As directed    Discharge instructions   Complete by: As directed    Follow with Primary MD Pcp, No in 7 days   Get CBC, CMP, Magnesium   -  checked next visit with your primary MD    Activity: As tolerated with Full fall precautions use walker/cane & assistance as needed  Disposition Home    Diet: Heart Healthy   Special Instructions: If you have  smoked or chewed Tobacco  in the last 2 yrs please stop smoking, stop any regular Alcohol   and or any Recreational drug use.  On your next visit with your primary care physician please Get Medicines reviewed and adjusted.  Please request your Prim.MD to go over all Hospital Tests and Procedure/Radiological results at the follow up, please get all Hospital records sent to your Prim MD by signing hospital release before you go home.  If you experience worsening of your admission symptoms, develop shortness of breath, life threatening emergency, suicidal or homicidal thoughts you must seek medical attention immediately by calling 911 or calling your MD immediately  if symptoms less severe.  You Must read complete instructions/literature along with all the possible adverse reactions/side effects for all the Medicines you take and that have been prescribed to you. Take any new Medicines after you have completely understood and accpet all the possible adverse reactions/side effects.   Do not drive when taking Pain medications.  Do not take more than prescribed Pain, Sleep and Anxiety Medications  Wear Seat belts while driving.   Increase activity slowly   Complete by: As directed        Discharge Medications   Allergies as of 05/26/2024       Reactions   Morphine  And Codeine Swelling        Medication List     TAKE these medications    cephALEXin  500 MG capsule Commonly known as: KEFLEX  Take 1 capsule (500 mg total) by mouth 3 (three) times daily for 4 days.   metroNIDAZOLE  500 MG tablet Commonly known as: FLAGYL  Take 1 tablet (500 mg total) by mouth 2 (two) times daily.   Xarelto  20 MG Tabs tablet Generic drug: rivaroxaban  Take 1 tablet (20 mg total) by mouth daily with supper.         Follow-up Information     [facility]. Schedule an appointment as soon as possible for a visit in 1 week(s).   Contact information: [address] [place]   [address] [phone]        Leigh Elspeth SQUIBB, MD. Schedule an appointment as soon as possible for a visit in 1 week(s).   Specialty: Gastroenterology Contact information: [address] [place] [address] [place] [address] [phone]                 Major procedures and Radiology Reports - PLEASE review detailed and final reports thoroughly  -       MR Cervical Spine W and Wo Contrast Result Date: 05/23/2024 CLINICAL DATA:  Low back  pain, prior surgery, new symptoms; Osteomyelitis, cervical; Mid-back pain EXAM: MRI CERVICAL, THORACIC AND LUMBAR SPINE WITHOUT AND WITH CONTRAST TECHNIQUE: Multiplanar and multiecho pulse sequences of the cervical spine, to include the craniocervical junction and cervicothoracic junction, and thoracic and lumbar spine, were obtained without and with intravenous contrast. CONTRAST:  8mL GADAVIST  GADOBUTROL  1 MMOL/ML IV SOLN COMPARISON:  CT chest/abdomen/pelvis 05/22/2024. MRI cervical and lumbar spine 05/09/2020. FINDINGS: MRI CERVICAL SPINE FINDINGS Mild to moderately motion limited study.  Within this limitation: Alignment: Normal. Vertebrae: No visible in marrow edema to suggest acute fracture or discitis/osteomyelitis. No suspicious bone lesions. Vertebral body heights are maintained. No abnormal enhancement. Cord: Normal cord signal. Posterior Fossa, vertebral arteries, paraspinal tissues: Negative. Disc levels: C2-C3: No significant disc protrusion, foraminal stenosis, or canal stenosis. C3-C4: Uncovertebral hypertrophy bilaterally with mild right foraminal stenosis. No significant canal or left foraminal stenosis. C4-C5: Left greater than right uncovertebral hypertrophy contributes to mild left foraminal stenosis. No significant canal or right foraminal stenosis. C5-C6: Posterior disc osteophyte complex and bilateral facet and uncovertebral hypertrophy. Resulting moderate right and mild left foraminal stenosis. Patent  canal. C6-C7: Posterior disc osteophyte complex with bilateral facet and uncovertebral hypertrophy. Suspected postoperative changes on the left including left laminectomy but motion limits assessment. The foramina appear patent but evaluation is limited. C7-T1: Right greater than left facet and uncovertebral hypertrophy. Mild right foraminal stenosis. MRI THORACIC SPINE FINDINGS Alignment:  Normal. Vertebrae: No marrow edema to suggest acute fracture or discitis/osteomyelitis. No suspicious bone lesions. No abnormal enhancement. Cord:  Normal cord signal.  No abnormal enhancement. Paraspinal and other soft tissues: Unremarkable. Disc levels: No significant canal stenosis.  No high-grade foraminal stenosis. MRI LUMBAR SPINE FINDINGS Segmentation:  Normal. Alignment:  Physiologic. Vertebrae: Inferior L5 endplate degenerative/discogenic signal changes. Otherwise, no marrow edema to suggest acute fracture or discitis/osteomyelitis. No suspicious bone lesions. No abnormal enhancement. Conus medullaris and cauda equina: Conus extends to the L1 level. Conus and cauda equina appear normal. No abnormal enhancement. Paraspinal and other soft tissues: Unremarkable. Disc levels: T12-L1: No significant disc protrusion, foraminal stenosis, or canal stenosis. L1-L2: No significant disc protrusion, foraminal stenosis, or canal stenosis. L2-L3: No significant disc protrusion, foraminal stenosis, or canal stenosis. L3-L4: No significant disc protrusion, foraminal stenosis, or canal stenosis. L4-L5: Mild broad disc bulge. Bilateral laminectomies. No significant canal stenosis. Similar mild bilateral foraminal stenosis. L5-S1: Similar small central disc protrusion without significant canal or foraminal stenosis. IMPRESSION: 1. No evidence of acute abnormality or discitis/osteomyelitis. 2. At C5-C6, similar moderate right and mild left foraminal stenosis. 3. At C6-C7, improved left foraminal stenosis status post left laminectomy  although evaluation is limited due to motion at this level. 4. Similar mild bilateral foraminal stenosis at L4-L5. 5. No significant canal or foraminal stenosis in the thoracic spine. Electronically Signed   By: Gilmore GORMAN Molt M.D.   On: 05/23/2024 02:29   MR THORACIC SPINE W WO CONTRAST Result Date: 05/23/2024 CLINICAL DATA:  Low back pain, prior surgery, new symptoms; Osteomyelitis, cervical; Mid-back pain EXAM: MRI CERVICAL, THORACIC AND LUMBAR SPINE WITHOUT AND WITH CONTRAST TECHNIQUE: Multiplanar and multiecho pulse sequences of the cervical spine, to include the craniocervical junction and cervicothoracic junction, and thoracic and lumbar spine, were obtained without and with intravenous contrast. CONTRAST:  8mL GADAVIST  GADOBUTROL  1 MMOL/ML IV SOLN COMPARISON:  CT chest/abdomen/pelvis 05/22/2024. MRI cervical and lumbar spine 05/09/2020. FINDINGS: MRI CERVICAL SPINE FINDINGS Mild to moderately motion limited study.  Within this limitation: Alignment: Normal. Vertebrae: No visible in marrow  edema to suggest acute fracture or discitis/osteomyelitis. No suspicious bone lesions. Vertebral body heights are maintained. No abnormal enhancement. Cord: Normal cord signal. Posterior Fossa, vertebral arteries, paraspinal tissues: Negative. Disc levels: C2-C3: No significant disc protrusion, foraminal stenosis, or canal stenosis. C3-C4: Uncovertebral hypertrophy bilaterally with mild right foraminal stenosis. No significant canal or left foraminal stenosis. C4-C5: Left greater than right uncovertebral hypertrophy contributes to mild left foraminal stenosis. No significant canal or right foraminal stenosis. C5-C6: Posterior disc osteophyte complex and bilateral facet and uncovertebral hypertrophy. Resulting moderate right and mild left foraminal stenosis. Patent canal. C6-C7: Posterior disc osteophyte complex with bilateral facet and uncovertebral hypertrophy. Suspected postoperative changes on the left including  left laminectomy but motion limits assessment. The foramina appear patent but evaluation is limited. C7-T1: Right greater than left facet and uncovertebral hypertrophy. Mild right foraminal stenosis. MRI THORACIC SPINE FINDINGS Alignment:  Normal. Vertebrae: No marrow edema to suggest acute fracture or discitis/osteomyelitis. No suspicious bone lesions. No abnormal enhancement. Cord:  Normal cord signal.  No abnormal enhancement. Paraspinal and other soft tissues: Unremarkable. Disc levels: No significant canal stenosis.  No high-grade foraminal stenosis. MRI LUMBAR SPINE FINDINGS Segmentation:  Normal. Alignment:  Physiologic. Vertebrae: Inferior L5 endplate degenerative/discogenic signal changes. Otherwise, no marrow edema to suggest acute fracture or discitis/osteomyelitis. No suspicious bone lesions. No abnormal enhancement. Conus medullaris and cauda equina: Conus extends to the L1 level. Conus and cauda equina appear normal. No abnormal enhancement. Paraspinal and other soft tissues: Unremarkable. Disc levels: T12-L1: No significant disc protrusion, foraminal stenosis, or canal stenosis. L1-L2: No significant disc protrusion, foraminal stenosis, or canal stenosis. L2-L3: No significant disc protrusion, foraminal stenosis, or canal stenosis. L3-L4: No significant disc protrusion, foraminal stenosis, or canal stenosis. L4-L5: Mild broad disc bulge. Bilateral laminectomies. No significant canal stenosis. Similar mild bilateral foraminal stenosis. L5-S1: Similar small central disc protrusion without significant canal or foraminal stenosis. IMPRESSION: 1. No evidence of acute abnormality or discitis/osteomyelitis. 2. At C5-C6, similar moderate right and mild left foraminal stenosis. 3. At C6-C7, improved left foraminal stenosis status post left laminectomy although evaluation is limited due to motion at this level. 4. Similar mild bilateral foraminal stenosis at L4-L5. 5. No significant canal or foraminal stenosis  in the thoracic spine. Electronically Signed   By: Gilmore GORMAN Molt M.D.   On: 05/23/2024 02:29   MR Lumbar Spine W Wo Contrast Result Date: 05/23/2024 CLINICAL DATA:  Low back pain, prior surgery, new symptoms; Osteomyelitis, cervical; Mid-back pain EXAM: MRI CERVICAL, THORACIC AND LUMBAR SPINE WITHOUT AND WITH CONTRAST TECHNIQUE: Multiplanar and multiecho pulse sequences of the cervical spine, to include the craniocervical junction and cervicothoracic junction, and thoracic and lumbar spine, were obtained without and with intravenous contrast. CONTRAST:  8mL GADAVIST  GADOBUTROL  1 MMOL/ML IV SOLN COMPARISON:  CT chest/abdomen/pelvis 05/22/2024. MRI cervical and lumbar spine 05/09/2020. FINDINGS: MRI CERVICAL SPINE FINDINGS Mild to moderately motion limited study.  Within this limitation: Alignment: Normal. Vertebrae: No visible in marrow edema to suggest acute fracture or discitis/osteomyelitis. No suspicious bone lesions. Vertebral body heights are maintained. No abnormal enhancement. Cord: Normal cord signal. Posterior Fossa, vertebral arteries, paraspinal tissues: Negative. Disc levels: C2-C3: No significant disc protrusion, foraminal stenosis, or canal stenosis. C3-C4: Uncovertebral hypertrophy bilaterally with mild right foraminal stenosis. No significant canal or left foraminal stenosis. C4-C5: Left greater than right uncovertebral hypertrophy contributes to mild left foraminal stenosis. No significant canal or right foraminal stenosis. C5-C6: Posterior disc osteophyte complex and bilateral facet and uncovertebral hypertrophy. Resulting moderate right  and mild left foraminal stenosis. Patent canal. C6-C7: Posterior disc osteophyte complex with bilateral facet and uncovertebral hypertrophy. Suspected postoperative changes on the left including left laminectomy but motion limits assessment. The foramina appear patent but evaluation is limited. C7-T1: Right greater than left facet and uncovertebral  hypertrophy. Mild right foraminal stenosis. MRI THORACIC SPINE FINDINGS Alignment:  Normal. Vertebrae: No marrow edema to suggest acute fracture or discitis/osteomyelitis. No suspicious bone lesions. No abnormal enhancement. Cord:  Normal cord signal.  No abnormal enhancement. Paraspinal and other soft tissues: Unremarkable. Disc levels: No significant canal stenosis.  No high-grade foraminal stenosis. MRI LUMBAR SPINE FINDINGS Segmentation:  Normal. Alignment:  Physiologic. Vertebrae: Inferior L5 endplate degenerative/discogenic signal changes. Otherwise, no marrow edema to suggest acute fracture or discitis/osteomyelitis. No suspicious bone lesions. No abnormal enhancement. Conus medullaris and cauda equina: Conus extends to the L1 level. Conus and cauda equina appear normal. No abnormal enhancement. Paraspinal and other soft tissues: Unremarkable. Disc levels: T12-L1: No significant disc protrusion, foraminal stenosis, or canal stenosis. L1-L2: No significant disc protrusion, foraminal stenosis, or canal stenosis. L2-L3: No significant disc protrusion, foraminal stenosis, or canal stenosis. L3-L4: No significant disc protrusion, foraminal stenosis, or canal stenosis. L4-L5: Mild broad disc bulge. Bilateral laminectomies. No significant canal stenosis. Similar mild bilateral foraminal stenosis. L5-S1: Similar small central disc protrusion without significant canal or foraminal stenosis. IMPRESSION: 1. No evidence of acute abnormality or discitis/osteomyelitis. 2. At C5-C6, similar moderate right and mild left foraminal stenosis. 3. At C6-C7, improved left foraminal stenosis status post left laminectomy although evaluation is limited due to motion at this level. 4. Similar mild bilateral foraminal stenosis at L4-L5. 5. No significant canal or foraminal stenosis in the thoracic spine. Electronically Signed   By: Gilmore GORMAN Molt M.D.   On: 05/23/2024 02:29   CT CHEST ABDOMEN PELVIS W CONTRAST Result Date:  05/22/2024 CLINICAL DATA:  Dysuria, urinary frequency, cystitis, altered level of consciousness, sepsis EXAM: CT CHEST, ABDOMEN, AND PELVIS WITH CONTRAST TECHNIQUE: Multidetector CT imaging of the chest, abdomen and pelvis was performed following the standard protocol during bolus administration of intravenous contrast. RADIATION DOSE REDUCTION: This exam was performed according to the departmental dose-optimization program which includes automated exposure control, adjustment of the mA and/or kV according to patient size and/or use of iterative reconstruction technique. CONTRAST:  75mL OMNIPAQUE  IOHEXOL  350 MG/ML SOLN COMPARISON:  05/04/2023, 05/22/2024 FINDINGS: CT CHEST FINDINGS Cardiovascular: The heart is unremarkable without pericardial effusion. No evidence of thoracic aortic aneurysm or dissection. Mediastinum/Nodes: No enlarged mediastinal, hilar, or axillary lymph nodes. Thyroid  gland, trachea, and esophagus demonstrate no significant findings. Lungs/Pleura: No acute airspace disease, effusion, or pneumothorax. Chronic scarring at the right lung base. Subpleural emphysematous changes within the upper lobes. Central airways are patent. Musculoskeletal: No acute or destructive bony abnormalities. Reconstructed images demonstrate no additional findings. CT ABDOMEN PELVIS FINDINGS Hepatobiliary: No focal liver abnormality is seen. No gallstones, gallbladder wall thickening, or biliary dilatation. Pancreas: Unremarkable. No pancreatic ductal dilatation or surrounding inflammatory changes. Spleen: Normal in size without focal abnormality. Adrenals/Urinary Tract: Adrenal glands are unremarkable. Kidneys are normal, without renal calculi, focal lesion, or hydronephrosis. Bladder is unremarkable. Stomach/Bowel: No evidence of bowel obstruction or ileus. Mild wall thickening throughout the distal small bowel could reflect inflammatory or infectious enteritis. The appendix is not well visualized. Vascular/Lymphatic:  No significant vascular findings are present. No enlarged abdominal or pelvic lymph nodes. Reproductive: Prostate is unremarkable. Other: No free fluid or free intraperitoneal gas. No abdominal wall hernia. Musculoskeletal: No  acute or destructive bony abnormalities. Reconstructed images demonstrate no additional findings. IMPRESSION: 1. Mild wall thickening of the distal small bowel, compatible with inflammatory or infectious enteritis. No bowel obstruction or ileus. 2. Chronic scarring at the right lung base. No acute airspace disease. Electronically Signed   By: Ozell Daring M.D.   On: 05/22/2024 23:43   DG Chest Port 1 View Result Date: 05/22/2024 CLINICAL DATA:  Sepsis EXAM: PORTABLE CHEST 1 VIEW COMPARISON:  11/09/2019 FINDINGS: The heart size and mediastinal contours are within normal limits. Both lungs are clear. The visualized skeletal structures are unremarkable. IMPRESSION: No active disease. Electronically Signed   By: Ozell Daring M.D.   On: 05/22/2024 21:59    Micro Results     Recent Results (from the past 240 hours)  Blood Culture (routine x 2)     Status: None (Preliminary result)   Collection Time: 05/22/24  9:19 PM   Specimen: BLOOD LEFT HAND  Result Value Ref Range Status   Specimen Description BLOOD LEFT HAND  Final   Special Requests   Final    BOTTLES DRAWN AEROBIC ONLY Blood Culture results may not be optimal due to an inadequate volume of blood received in culture bottles   Culture   Final    NO GROWTH 3 DAYS Performed at [facility], [address] [place], [place] [address]    Report Status PENDING  Incomplete  Resp panel by RT-PCR (RSV, Flu A&B, Covid) Anterior Nasal Swab     Status: None   Collection Time: 05/22/24 10:07 PM   Specimen: Anterior Nasal Swab  Result Value Ref Range Status   SARS Coronavirus 2 by RT PCR NEGATIVE NEGATIVE Final   Influenza A by PCR NEGATIVE NEGATIVE Final   Influenza B by PCR NEGATIVE NEGATIVE Final    Comment:  (NOTE) The Xpert Xpress SARS-CoV-2/FLU/RSV plus assay is intended as an aid in the diagnosis of influenza from Nasopharyngeal swab specimens and should not be used as a sole basis for treatment. Nasal washings and aspirates are unacceptable for Xpert Xpress SARS-CoV-2/FLU/RSV testing.  Fact Sheet for Patients: BloggerCourse.com  Fact Sheet for Healthcare Providers: SeriousBroker.it  This test is not yet approved or cleared by the [place]  FDA and has been authorized for detection and/or diagnosis of SARS-CoV-2 by FDA under an Emergency Use Authorization (EUA). This EUA will remain in effect (meaning this test can be used) for the duration of the COVID-19 declaration under Section 564(b)(1) of the Act, 21 U.S.C. section 360bbb-3(b)(1), unless the authorization is terminated or revoked.     Resp Syncytial Virus by PCR NEGATIVE NEGATIVE Final    Comment: (NOTE) Fact Sheet for Patients: BloggerCourse.com  Fact Sheet for Healthcare Providers: SeriousBroker.it  This test is not yet approved or cleared by the [place]  FDA and has been authorized for detection and/or diagnosis of SARS-CoV-2 by FDA under an Emergency Use Authorization (EUA). This EUA will remain in effect (meaning this test can be used) for the duration of the COVID-19 declaration under Section 564(b)(1) of the Act, 21 U.S.C. section 360bbb-3(b)(1), unless the authorization is terminated or revoked.  Performed at [facility], [address], [place], [place] [address]   Blood Culture (routine x 2)     Status: None (Preliminary result)   Collection Time: 05/22/24 10:27 PM   Specimen: BLOOD  Result Value Ref Range Status   Specimen Description BLOOD BLOOD RIGHT ARM  Final   Special Requests   Final  BOTTLES DRAWN AEROBIC AND ANAEROBIC Blood Culture results may not be optimal due to an inadequate  volume of blood received in culture bottles   Culture   Final    NO GROWTH 3 DAYS Performed at [facility], [address] [place], [place] [address]    Report Status PENDING  Incomplete    Today   Subjective    Jason [name] today has no headache,no chest abdominal pain,no new weakness tingling or numbness, feels much better wants to go home today.     Objective   Blood pressure 115/74, pulse (!) 54, temperature 98.6 F (37 C), temperature source Oral, resp. rate 17, height 5' 8 (1.727 m), weight 68.7 kg, SpO2 96%.   Intake/Output Summary (Last 24 hours) at 05/26/2024 0918 Last data filed at 05/26/2024 0738 Gross per 24 hour  Intake 240 ml  Output 1950 ml  Net -1710 ml    Exam  Awake Alert, No new F.N deficits,    [place].AT,PERRAL Supple Neck,   Symmetrical Chest wall movement, Good air movement bilaterally, CTAB RRR,No Gallops,   +ve B.Sounds, Abd Soft, Non tender,  No Cyanosis, Clubbing or edema    Data Review   Recent Labs  Lab 05/22/24 2227 05/23/24 0601 05/24/24 0638 05/25/24 0529  WBC 12.3* 10.2 12.8* 9.0  HGB 12.9* 12.1* 13.7 13.1  HCT 38.1* 35.6* 38.9* 37.4*  PLT 319 252 282 252  MCV 77.9* 77.7* 75.7* 76.5*  MCH 26.4 26.4 26.7 26.8  MCHC 33.9 34.0 35.2 35.0  RDW 14.6 14.7 14.5 14.3  LYMPHSABS 1.0 1.3 1.9  --   MONOABS 0.6 0.9 1.3*  --   EOSABS 0.0 0.0 0.0  --   BASOSABS 0.0 0.0 0.0  --     Recent Labs  Lab 05/22/24 2227 05/23/24 0008 05/23/24 0601 05/24/24 0638 05/25/24 0529 05/26/24 0720  NA 141  --  139 140 136 137  K 3.5  --  3.1* 3.4* 3.4* 3.3*  CL 103  --  113* 105 103 103  CO2 22  --  21* 24 22 25   ANIONGAP 16*  --  5 11 11 9   GLUCOSE 128*  --  94 113* 103* 95  BUN 13  --  11 11 7 8   CREATININE 1.11  --  1.05 1.01 1.05 1.05  AST 19  --  16 22  --   --   ALT 14  --  13 14  --   --   ALKPHOS 47  --  36* 39  --   --   BILITOT 0.9  --  0.9 0.8  --   --   ALBUMIN 3.7  --  3.0* 3.6  --   --   CRP  --   --  <0.5  --   --    --   LATICACIDVEN 4.5* 6.8* 4.1*  --   --   --   INR 1.2  --   --   --   --   --   MG  --   --  1.4* 2.0 1.8  --   CALCIUM  9.1  --  7.9* 9.0 8.8* 8.9    Total Time in preparing paper work, data evaluation and todays exam - 35 minutes  Signature  -    Lavada Stank M.D on 05/26/2024 at 9:18 AM   -  To page go to [email or web address]

## 2024-05-26 NOTE — Plan of Care (Signed)

## 2024-05-26 NOTE — Plan of Care (Signed)

## 2024-05-27 LAB — CULTURE, BLOOD (ROUTINE X 2)
Culture: NO GROWTH
Culture: NO GROWTH
